# Patient Record
Sex: Female | Born: 1945 | Race: Black or African American | Hispanic: No | Marital: Married | Smoking: Never smoker
Health system: Southern US, Community
[De-identification: ages and names within clinical notes are randomized; demographics above are authoritative.]

## PROBLEM LIST (undated history)

## (undated) DIAGNOSIS — M949 Disorder of cartilage, unspecified: Secondary | ICD-10-CM

## (undated) DIAGNOSIS — Z860100 Personal history of colon polyps, unspecified: Secondary | ICD-10-CM

## (undated) DIAGNOSIS — E785 Hyperlipidemia, unspecified: Secondary | ICD-10-CM

## (undated) DIAGNOSIS — E119 Type 2 diabetes mellitus without complications: Secondary | ICD-10-CM

## (undated) DIAGNOSIS — M899 Disorder of bone, unspecified: Secondary | ICD-10-CM

## (undated) DIAGNOSIS — M1990 Unspecified osteoarthritis, unspecified site: Secondary | ICD-10-CM

## (undated) DIAGNOSIS — K8020 Calculus of gallbladder without cholecystitis without obstruction: Secondary | ICD-10-CM

## (undated) DIAGNOSIS — K219 Gastro-esophageal reflux disease without esophagitis: Secondary | ICD-10-CM

## (undated) DIAGNOSIS — R42 Dizziness and giddiness: Secondary | ICD-10-CM

## (undated) DIAGNOSIS — K222 Esophageal obstruction: Secondary | ICD-10-CM

## (undated) DIAGNOSIS — G4710 Hypersomnia, unspecified: Secondary | ICD-10-CM

## (undated) DIAGNOSIS — F3289 Other specified depressive episodes: Secondary | ICD-10-CM

## (undated) DIAGNOSIS — K5792 Diverticulitis of intestine, part unspecified, without perforation or abscess without bleeding: Secondary | ICD-10-CM

## (undated) DIAGNOSIS — E669 Obesity, unspecified: Secondary | ICD-10-CM

## (undated) DIAGNOSIS — K5730 Diverticulosis of large intestine without perforation or abscess without bleeding: Secondary | ICD-10-CM

## (undated) DIAGNOSIS — Z9289 Personal history of other medical treatment: Secondary | ICD-10-CM

## (undated) DIAGNOSIS — J309 Allergic rhinitis, unspecified: Secondary | ICD-10-CM

## (undated) DIAGNOSIS — F329 Major depressive disorder, single episode, unspecified: Secondary | ICD-10-CM

## (undated) DIAGNOSIS — Z780 Asymptomatic menopausal state: Secondary | ICD-10-CM

## (undated) DIAGNOSIS — K449 Diaphragmatic hernia without obstruction or gangrene: Secondary | ICD-10-CM

## (undated) DIAGNOSIS — I10 Essential (primary) hypertension: Secondary | ICD-10-CM

## (undated) DIAGNOSIS — F411 Generalized anxiety disorder: Secondary | ICD-10-CM

## (undated) DIAGNOSIS — J189 Pneumonia, unspecified organism: Secondary | ICD-10-CM

## (undated) HISTORY — DX: Gastro-esophageal reflux disease without esophagitis: K21.9

## (undated) HISTORY — DX: Dizziness and giddiness: R42

## (undated) HISTORY — PX: OOPHORECTOMY: SHX86

## (undated) HISTORY — PX: ROTATOR CUFF REPAIR: SHX139

## (undated) HISTORY — PX: ABDOMINAL HYSTERECTOMY: SHX81

## (undated) HISTORY — PX: TOTAL KNEE ARTHROPLASTY: SHX125

## (undated) HISTORY — DX: Personal history of colon polyps, unspecified: Z86.0100

## (undated) HISTORY — DX: Type 2 diabetes mellitus without complications: E11.9

## (undated) HISTORY — DX: Unspecified osteoarthritis, unspecified site: M19.90

## (undated) HISTORY — DX: Asymptomatic menopausal state: Z78.0

## (undated) HISTORY — PX: OTHER SURGICAL HISTORY: SHX169

## (undated) HISTORY — DX: Major depressive disorder, single episode, unspecified: F32.9

## (undated) HISTORY — DX: Hyperlipidemia, unspecified: E78.5

## (undated) HISTORY — DX: Other specified depressive episodes: F32.89

## (undated) HISTORY — DX: Essential (primary) hypertension: I10

## (undated) HISTORY — DX: Diverticulitis of intestine, part unspecified, without perforation or abscess without bleeding: K57.92

## (undated) HISTORY — PX: CHOLECYSTECTOMY: SHX55

## (undated) HISTORY — DX: Disorder of bone, unspecified: M89.9

## (undated) HISTORY — DX: Hypersomnia, unspecified: G47.10

## (undated) HISTORY — DX: Obesity, unspecified: E66.9

## (undated) HISTORY — DX: Personal history of other medical treatment: Z92.89

## (undated) HISTORY — DX: Pneumonia, unspecified organism: J18.9

## (undated) HISTORY — DX: Diverticulosis of large intestine without perforation or abscess without bleeding: K57.30

## (undated) HISTORY — DX: Calculus of gallbladder without cholecystitis without obstruction: K80.20

## (undated) HISTORY — DX: Diaphragmatic hernia without obstruction or gangrene: K44.9

## (undated) HISTORY — PX: POLYPECTOMY: SHX149

## (undated) HISTORY — DX: Esophageal obstruction: K22.2

## (undated) HISTORY — DX: Allergic rhinitis, unspecified: J30.9

## (undated) HISTORY — DX: Disorder of cartilage, unspecified: M94.9

## (undated) HISTORY — DX: Personal history of colonic polyps: Z86.010

## (undated) HISTORY — DX: Generalized anxiety disorder: F41.1

---

## 1998-05-21 ENCOUNTER — Ambulatory Visit (HOSPITAL_BASED_OUTPATIENT_CLINIC_OR_DEPARTMENT_OTHER): Admission: RE | Admit: 1998-05-21 | Discharge: 1998-05-21 | Payer: Self-pay | Admitting: Orthopedic Surgery

## 1998-10-29 ENCOUNTER — Ambulatory Visit (HOSPITAL_COMMUNITY): Admission: RE | Admit: 1998-10-29 | Discharge: 1998-10-29 | Payer: Self-pay | Admitting: Internal Medicine

## 1998-10-29 ENCOUNTER — Encounter: Payer: Self-pay | Admitting: Internal Medicine

## 1998-11-04 ENCOUNTER — Other Ambulatory Visit: Admission: RE | Admit: 1998-11-04 | Discharge: 1998-11-04 | Payer: Self-pay | Admitting: Internal Medicine

## 2000-09-27 ENCOUNTER — Inpatient Hospital Stay (HOSPITAL_COMMUNITY): Admission: EM | Admit: 2000-09-27 | Discharge: 2000-09-28 | Payer: Self-pay | Admitting: Emergency Medicine

## 2000-09-27 ENCOUNTER — Encounter: Payer: Self-pay | Admitting: Internal Medicine

## 2000-09-28 ENCOUNTER — Encounter: Payer: Self-pay | Admitting: Internal Medicine

## 2000-10-27 ENCOUNTER — Other Ambulatory Visit: Admission: RE | Admit: 2000-10-27 | Discharge: 2000-10-27 | Payer: Self-pay | Admitting: Internal Medicine

## 2000-10-27 ENCOUNTER — Encounter (INDEPENDENT_AMBULATORY_CARE_PROVIDER_SITE_OTHER): Payer: Self-pay | Admitting: Specialist

## 2001-09-02 ENCOUNTER — Encounter: Payer: Self-pay | Admitting: Emergency Medicine

## 2001-09-02 ENCOUNTER — Emergency Department (HOSPITAL_COMMUNITY): Admission: EM | Admit: 2001-09-02 | Discharge: 2001-09-02 | Payer: Self-pay | Admitting: Emergency Medicine

## 2003-03-19 ENCOUNTER — Encounter: Admission: RE | Admit: 2003-03-19 | Discharge: 2003-06-17 | Payer: Self-pay | Admitting: Internal Medicine

## 2003-07-17 ENCOUNTER — Encounter: Admission: RE | Admit: 2003-07-17 | Discharge: 2003-10-15 | Payer: Self-pay | Admitting: Internal Medicine

## 2004-07-02 ENCOUNTER — Ambulatory Visit: Payer: Self-pay | Admitting: Internal Medicine

## 2004-07-08 ENCOUNTER — Ambulatory Visit: Payer: Self-pay | Admitting: Internal Medicine

## 2004-07-29 ENCOUNTER — Encounter (INDEPENDENT_AMBULATORY_CARE_PROVIDER_SITE_OTHER): Payer: Self-pay | Admitting: Specialist

## 2004-07-29 ENCOUNTER — Observation Stay (HOSPITAL_COMMUNITY): Admission: RE | Admit: 2004-07-29 | Discharge: 2004-07-30 | Payer: Self-pay | Admitting: Surgery

## 2004-10-15 ENCOUNTER — Ambulatory Visit: Payer: Self-pay | Admitting: Internal Medicine

## 2004-12-31 ENCOUNTER — Ambulatory Visit: Payer: Self-pay | Admitting: Internal Medicine

## 2005-01-30 ENCOUNTER — Ambulatory Visit: Payer: Self-pay | Admitting: Internal Medicine

## 2005-04-22 ENCOUNTER — Ambulatory Visit: Payer: Self-pay | Admitting: Internal Medicine

## 2005-06-02 ENCOUNTER — Ambulatory Visit: Payer: Self-pay | Admitting: General Practice

## 2005-06-24 ENCOUNTER — Ambulatory Visit: Payer: Self-pay | Admitting: General Practice

## 2005-07-01 ENCOUNTER — Ambulatory Visit: Payer: Self-pay | Admitting: Internal Medicine

## 2005-08-17 ENCOUNTER — Ambulatory Visit: Payer: Self-pay | Admitting: Internal Medicine

## 2005-08-27 ENCOUNTER — Ambulatory Visit: Payer: Self-pay | Admitting: Internal Medicine

## 2005-10-21 ENCOUNTER — Ambulatory Visit: Payer: Self-pay | Admitting: Internal Medicine

## 2005-10-21 ENCOUNTER — Ambulatory Visit (HOSPITAL_COMMUNITY): Admission: RE | Admit: 2005-10-21 | Discharge: 2005-10-21 | Payer: Self-pay | Admitting: Internal Medicine

## 2005-10-26 ENCOUNTER — Ambulatory Visit: Payer: Self-pay | Admitting: Internal Medicine

## 2006-03-09 ENCOUNTER — Ambulatory Visit: Payer: Self-pay | Admitting: Internal Medicine

## 2006-05-13 ENCOUNTER — Ambulatory Visit: Payer: Self-pay | Admitting: Internal Medicine

## 2006-06-16 ENCOUNTER — Ambulatory Visit: Payer: Self-pay | Admitting: General Practice

## 2006-09-10 ENCOUNTER — Ambulatory Visit: Payer: Self-pay | Admitting: Internal Medicine

## 2006-09-10 LAB — CONVERTED CEMR LAB
ALT: 17 units/L (ref 0–40)
Albumin: 3.7 g/dL (ref 3.5–5.2)
Alkaline Phosphatase: 58 units/L (ref 39–117)
BUN: 17 mg/dL (ref 6–23)
Basophils Relative: 0.5 % (ref 0.0–1.0)
Bilirubin Urine: NEGATIVE
Calcium: 10 mg/dL (ref 8.4–10.5)
Chloride: 101 meq/L (ref 96–112)
Chol/HDL Ratio, serum: 4.6
GFR calc non Af Amer: 60 mL/min
Glomerular Filtration Rate, Af Am: 73 mL/min/{1.73_m2}
Hemoglobin: 12.9 g/dL (ref 12.0–15.0)
Hgb A1c MFr Bld: 7.3 % — ABNORMAL HIGH (ref 4.6–6.0)
LDL DIRECT: 129.4 mg/dL
Leukocytes, UA: NEGATIVE
Lymphocytes Relative: 56.5 % — ABNORMAL HIGH (ref 12.0–46.0)
MCHC: 33.6 g/dL (ref 30.0–36.0)
MCV: 87.3 fL (ref 78.0–100.0)
Monocytes Relative: 7.9 % (ref 3.0–11.0)
Potassium: 4.1 meq/L (ref 3.5–5.1)
RBC: 4.38 M/uL (ref 3.87–5.11)
Sodium: 140 meq/L (ref 135–145)
TSH: 1.82 microintl units/mL (ref 0.35–5.50)
Total Bilirubin: 0.7 mg/dL (ref 0.3–1.2)
Urine Glucose: NEGATIVE mg/dL
Urobilinogen, UA: 0.2 (ref 0.0–1.0)
VLDL: 75 mg/dL — ABNORMAL HIGH (ref 0–40)

## 2006-11-16 ENCOUNTER — Ambulatory Visit: Payer: Self-pay | Admitting: Internal Medicine

## 2006-12-14 ENCOUNTER — Inpatient Hospital Stay (HOSPITAL_COMMUNITY): Admission: RE | Admit: 2006-12-14 | Discharge: 2006-12-18 | Payer: Self-pay | Admitting: Orthopedic Surgery

## 2007-06-20 ENCOUNTER — Ambulatory Visit: Payer: Self-pay | Admitting: Internal Medicine

## 2007-06-20 LAB — CONVERTED CEMR LAB
Albumin: 3.9 g/dL (ref 3.5–5.2)
BUN: 19 mg/dL (ref 6–23)
Bilirubin, Direct: 0.1 mg/dL (ref 0.0–0.3)
CO2: 34 meq/L — ABNORMAL HIGH (ref 19–32)
Calcium: 9.9 mg/dL (ref 8.4–10.5)
Cholesterol: 205 mg/dL (ref 0–200)
Eosinophils Absolute: 0.1 10*3/uL (ref 0.0–0.6)
GFR calc non Af Amer: 68 mL/min
HCT: 37.4 % (ref 36.0–46.0)
Hemoglobin: 12.8 g/dL (ref 12.0–15.0)
Hgb A1c MFr Bld: 6.2 % — ABNORMAL HIGH (ref 4.6–6.0)
Lymphocytes Relative: 50.1 % — ABNORMAL HIGH (ref 12.0–46.0)
Microalb Creat Ratio: 16 mg/g (ref 0.0–30.0)
Monocytes Relative: 9.9 % (ref 3.0–11.0)
Neutro Abs: 2 10*3/uL (ref 1.4–7.7)
Nitrite: NEGATIVE
Platelets: 342 10*3/uL (ref 150–400)
Potassium: 4.2 meq/L (ref 3.5–5.1)
RBC: 4.1 M/uL (ref 3.87–5.11)
RDW: 12.4 % (ref 11.5–14.6)
Sodium: 145 meq/L (ref 135–145)
TSH: 2.05 microintl units/mL (ref 0.35–5.50)
Total Protein: 7.2 g/dL (ref 6.0–8.3)
Urobilinogen, UA: 0.2 (ref 0.0–1.0)
WBC: 5.3 10*3/uL (ref 4.5–10.5)
pH: 6 (ref 5.0–8.0)

## 2007-06-21 ENCOUNTER — Encounter: Payer: Self-pay | Admitting: Internal Medicine

## 2007-07-03 ENCOUNTER — Encounter: Payer: Self-pay | Admitting: Internal Medicine

## 2007-07-03 DIAGNOSIS — E785 Hyperlipidemia, unspecified: Secondary | ICD-10-CM

## 2007-07-03 DIAGNOSIS — K5730 Diverticulosis of large intestine without perforation or abscess without bleeding: Secondary | ICD-10-CM | POA: Insufficient documentation

## 2007-07-03 DIAGNOSIS — E119 Type 2 diabetes mellitus without complications: Secondary | ICD-10-CM

## 2007-07-03 DIAGNOSIS — F411 Generalized anxiety disorder: Secondary | ICD-10-CM

## 2007-07-03 DIAGNOSIS — F329 Major depressive disorder, single episode, unspecified: Secondary | ICD-10-CM

## 2007-07-03 DIAGNOSIS — K219 Gastro-esophageal reflux disease without esophagitis: Secondary | ICD-10-CM | POA: Insufficient documentation

## 2007-07-03 DIAGNOSIS — M1990 Unspecified osteoarthritis, unspecified site: Secondary | ICD-10-CM | POA: Insufficient documentation

## 2007-07-03 DIAGNOSIS — K449 Diaphragmatic hernia without obstruction or gangrene: Secondary | ICD-10-CM | POA: Insufficient documentation

## 2007-07-03 DIAGNOSIS — E1169 Type 2 diabetes mellitus with other specified complication: Secondary | ICD-10-CM | POA: Insufficient documentation

## 2007-07-03 DIAGNOSIS — I10 Essential (primary) hypertension: Secondary | ICD-10-CM | POA: Insufficient documentation

## 2007-07-03 DIAGNOSIS — E669 Obesity, unspecified: Secondary | ICD-10-CM | POA: Insufficient documentation

## 2007-07-03 DIAGNOSIS — Z86010 Personal history of colonic polyps: Secondary | ICD-10-CM | POA: Insufficient documentation

## 2007-07-03 DIAGNOSIS — J309 Allergic rhinitis, unspecified: Secondary | ICD-10-CM

## 2007-10-24 ENCOUNTER — Encounter: Payer: Self-pay | Admitting: Internal Medicine

## 2007-12-16 ENCOUNTER — Emergency Department (HOSPITAL_COMMUNITY): Admission: EM | Admit: 2007-12-16 | Discharge: 2007-12-16 | Payer: Self-pay | Admitting: Emergency Medicine

## 2008-03-06 ENCOUNTER — Telehealth (INDEPENDENT_AMBULATORY_CARE_PROVIDER_SITE_OTHER): Payer: Self-pay | Admitting: *Deleted

## 2008-03-08 ENCOUNTER — Ambulatory Visit: Payer: Self-pay | Admitting: Internal Medicine

## 2008-03-08 DIAGNOSIS — J189 Pneumonia, unspecified organism: Secondary | ICD-10-CM

## 2008-03-08 DIAGNOSIS — R05 Cough: Secondary | ICD-10-CM

## 2008-03-08 DIAGNOSIS — R209 Unspecified disturbances of skin sensation: Secondary | ICD-10-CM

## 2008-04-02 ENCOUNTER — Ambulatory Visit: Payer: Self-pay | Admitting: Internal Medicine

## 2008-04-02 LAB — CONVERTED CEMR LAB
AST: 20 units/L (ref 0–37)
Albumin: 3.6 g/dL (ref 3.5–5.2)
Alkaline Phosphatase: 50 units/L (ref 39–117)
Bilirubin, Direct: 0.1 mg/dL (ref 0.0–0.3)
Calcium: 9.2 mg/dL (ref 8.4–10.5)
Cholesterol: 209 mg/dL (ref 0–200)
Folate: 10.1 ng/mL
GFR calc Af Amer: 82 mL/min
GFR calc non Af Amer: 68 mL/min
HDL: 53.9 mg/dL (ref >39.0)
Hgb A1c MFr Bld: 6.7 % — ABNORMAL HIGH (ref 4.6–6.0)
Potassium: 4.2 meq/L (ref 3.5–5.1)
TSH: 2.33 microintl units/mL (ref 0.35–5.50)
Total Bilirubin: 0.8 mg/dL (ref 0.3–1.2)
Triglycerides: 187 mg/dL — ABNORMAL HIGH (ref 0–149)
VLDL: 37 mg/dL (ref 0–40)
Vitamin B-12: 464 pg/mL (ref 211–911)

## 2008-04-09 ENCOUNTER — Ambulatory Visit: Payer: Self-pay | Admitting: Internal Medicine

## 2008-04-09 DIAGNOSIS — M899 Disorder of bone, unspecified: Secondary | ICD-10-CM | POA: Insufficient documentation

## 2008-04-09 DIAGNOSIS — M949 Disorder of cartilage, unspecified: Secondary | ICD-10-CM

## 2008-04-16 ENCOUNTER — Encounter: Payer: Self-pay | Admitting: Internal Medicine

## 2008-04-16 ENCOUNTER — Ambulatory Visit: Payer: Self-pay | Admitting: Internal Medicine

## 2008-06-19 ENCOUNTER — Telehealth (INDEPENDENT_AMBULATORY_CARE_PROVIDER_SITE_OTHER): Payer: Self-pay | Admitting: *Deleted

## 2008-06-22 ENCOUNTER — Ambulatory Visit: Payer: Self-pay | Admitting: Internal Medicine

## 2008-10-15 ENCOUNTER — Ambulatory Visit: Payer: Self-pay | Admitting: Internal Medicine

## 2008-10-15 DIAGNOSIS — J019 Acute sinusitis, unspecified: Secondary | ICD-10-CM

## 2008-10-17 LAB — CONVERTED CEMR LAB
CO2: 26 meq/L (ref 19–32)
Calcium: 10.1 mg/dL (ref 8.4–10.5)
Creatinine, Ser: 1 mg/dL (ref 0.4–1.2)
Glucose, Bld: 162 mg/dL — ABNORMAL HIGH (ref 70–99)
HDL: 57.3 mg/dL (ref >39.0)
Hgb A1c MFr Bld: 6.8 % — ABNORMAL HIGH (ref 4.6–6.0)
VLDL: 54 mg/dL — ABNORMAL HIGH (ref 0–40)

## 2009-01-03 ENCOUNTER — Telehealth (INDEPENDENT_AMBULATORY_CARE_PROVIDER_SITE_OTHER): Payer: Self-pay | Admitting: *Deleted

## 2009-01-11 ENCOUNTER — Telehealth: Payer: Self-pay | Admitting: Internal Medicine

## 2009-01-11 ENCOUNTER — Ambulatory Visit: Payer: Self-pay | Admitting: Internal Medicine

## 2009-01-11 DIAGNOSIS — E860 Dehydration: Secondary | ICD-10-CM

## 2009-03-14 ENCOUNTER — Encounter (INDEPENDENT_AMBULATORY_CARE_PROVIDER_SITE_OTHER): Payer: Self-pay | Admitting: *Deleted

## 2009-08-07 ENCOUNTER — Ambulatory Visit: Payer: Self-pay | Admitting: Internal Medicine

## 2009-08-07 DIAGNOSIS — R42 Dizziness and giddiness: Secondary | ICD-10-CM

## 2009-08-09 LAB — CONVERTED CEMR LAB
Calcium: 9.1 mg/dL (ref 8.4–10.5)
Chloride: 103 meq/L (ref 96–112)
Direct LDL: 113.2 mg/dL
Glucose, Bld: 238 mg/dL — ABNORMAL HIGH (ref 70–99)
Hgb A1c MFr Bld: 9 % — ABNORMAL HIGH (ref 4.6–6.5)
Potassium: 4.3 meq/L (ref 3.5–5.1)

## 2009-10-25 ENCOUNTER — Telehealth: Payer: Self-pay | Admitting: Internal Medicine

## 2010-03-14 ENCOUNTER — Telehealth: Payer: Self-pay | Admitting: Internal Medicine

## 2010-03-24 ENCOUNTER — Ambulatory Visit (HOSPITAL_COMMUNITY): Admission: RE | Admit: 2010-03-24 | Discharge: 2010-03-24 | Payer: Self-pay | Admitting: Internal Medicine

## 2010-03-26 ENCOUNTER — Encounter: Payer: Self-pay | Admitting: Internal Medicine

## 2010-03-27 ENCOUNTER — Encounter: Admission: RE | Admit: 2010-03-27 | Discharge: 2010-03-27 | Payer: Self-pay | Admitting: Internal Medicine

## 2010-03-27 LAB — HM MAMMOGRAPHY

## 2010-04-16 ENCOUNTER — Encounter (INDEPENDENT_AMBULATORY_CARE_PROVIDER_SITE_OTHER): Payer: Self-pay | Admitting: *Deleted

## 2010-04-18 ENCOUNTER — Encounter: Payer: Self-pay | Admitting: Internal Medicine

## 2010-05-02 ENCOUNTER — Encounter (INDEPENDENT_AMBULATORY_CARE_PROVIDER_SITE_OTHER): Payer: Self-pay

## 2010-05-06 ENCOUNTER — Encounter (INDEPENDENT_AMBULATORY_CARE_PROVIDER_SITE_OTHER): Payer: Self-pay

## 2010-05-06 ENCOUNTER — Ambulatory Visit: Payer: Self-pay | Admitting: Internal Medicine

## 2010-05-07 ENCOUNTER — Telehealth: Payer: Self-pay | Admitting: Internal Medicine

## 2010-05-19 ENCOUNTER — Ambulatory Visit: Payer: Self-pay | Admitting: Internal Medicine

## 2010-05-19 LAB — HM COLONOSCOPY

## 2010-05-21 ENCOUNTER — Encounter: Payer: Self-pay | Admitting: Internal Medicine

## 2010-09-29 ENCOUNTER — Telehealth: Payer: Self-pay | Admitting: Internal Medicine

## 2010-10-02 NOTE — Letter (Signed)
Summary: Previsit letter  [facility]  [place], [place] [address]   Phone: [phone]  Fax: [phone]       04/16/2010 MRN: [id number]  [place], [place]  [address]  Dear Ms. Sheppard Coil,  Welcome to the Gastroenterology Division at [facility].    You are scheduled to see a nurse for your pre-procedure visit on 05-21-2010 at 10:00am on the 3rd floor at [facility], [place] [employer].  We ask that you try to arrive at our office 15 minutes prior to your appointment time to allow for check-in.  Your nurse visit will consist of discussing your medical and surgical history, your immediate family medical history, and your medications.    Please bring a complete list of all your medications or, if you prefer, bring the medication bottles and we will list them.  We will need to be aware of both prescribed and over the counter drugs.  We will need to know exact dosage information as well.  If you are on blood thinners (Coumadin, Plavix, Aggrenox, Ticlid, etc.) please call our office today/prior to your appointment, as we need to consult with your physician about holding your medication.   Please be prepared to read and sign documents such as consent forms, a financial agreement, and acknowledgement forms.  If necessary, and with your consent, a friend or relative is welcome to sit-in on the nurse visit with you.  Please bring your insurance card so that we may make a copy of it.  If your insurance requires a referral to see a specialist, please bring your referral form from your primary care physician.  No co-pay is required for this nurse visit.     If you cannot keep your appointment, please call [phone] to cancel or reschedule prior to your appointment date.  This allows [place] the opportunity to schedule an appointment for another patient in need of care.    Thank you for choosing [facility] for your medical  needs.  We appreciate the opportunity to care for you.  Please visit [place] at our website  to learn more about our practice.                     Sincerely.                                                                                                                   The Gastroenterology Division

## 2010-10-02 NOTE — Progress Notes (Signed)
Summary: REFILL - Tramadol  Phone Note Refill Request   Refills Requested: Medication #1:  TRAMADOL HCL 50 MG TABS 1 by mouth q 6 hrs as needed pain [facility]  Initial call taken by: Charlsie Quest, CMA,  May 07, 2010 5:36 PM  Follow-up for Phone Call        [facility] and most likely did not get this rx, will need to call pharm in am to comfirm reciept of last refill.  Follow-up by: Charlsie Quest, CMA,  May 07, 2010 6:38 PM     no, just refilled sept 6, 2011 Biagio Borg MD  May 07, 2010 5:41 PM   Per pharmacist Joelene Millin @ [facility] [address] e-script 05/06/2010 was not recieved.  Verbal given for #40 x 1. Crissie Sickles, CMA  May 08, 2010 9:36 AM

## 2010-10-02 NOTE — Letter (Signed)
Summary: Patient Notice- Polyp Results  [facility]  [address] [place], [place] [address]   Phone: [phone]  Fax: [phone]        May 21, 2010 MRN: [id number]    [place], [place]  [address]    Dear Ms. Goldblatt,  I am pleased to inform you that the colon polyp(s) removed during your recent colonoscopy was (were) found to be benign (no cancer detected) upon pathologic examination.  I recommend you have a repeat colonoscopy examination in 5 years to look for recurrent polyps, as having colon polyps increases your risk for having recurrent polyps or even colon cancer in the future.  Should you develop new or worsening symptoms of abdominal pain, bowel habit changes or bleeding from the rectum or bowels, please schedule an evaluation with either your primary care physician or with me.  Additional information/recommendations:  __ No further action with gastroenterology is needed at this time. Please      follow-up with your primary care physician for your other healthcare      needs.    Please call us if you are having persistent problems or have questions about your condition that have not been fully answered at this time.  Sincerely,  Irene Shipper MD  This letter has been electronically signed by your physician.  Appended Document: Patient Notice- Polyp Results letter mailed

## 2010-10-02 NOTE — Progress Notes (Signed)
Summary: Schedule Colonoscopy   Phone Note Outgoing Call Call back at Home Phone [phone]   Call placed by: Bernita Buffy CMA Deborra Medina),  October 25, 2009 9:55 AM Call placed to: Patient Summary of Call: patient advised she needs a colonoscopy she does not have insurance at this time; I gave her the number for patient assistance. She will contact them and then give [place] a call back when she has heard back from them about her coverage.  Initial call taken by: Bernita Buffy CMA ([place]),  October 25, 2009 10:00 AM

## 2010-10-02 NOTE — Miscellaneous (Signed)
Summary: Lec previsit  Clinical Lists Changes  Medications: Added new medication of MOVIPREP 100 GM  SOLR (PEG-KCL-NACL-NASULF-NA ASC-C) As per prep instructions. - Signed Rx of MOVIPREP 100 GM  SOLR (PEG-KCL-NACL-NASULF-NA ASC-C) As per prep instructions.;  #1 x 0;  Signed;  Entered by: Cornelia Copa RN;  Authorized by: Irene Shipper MD;  Method used: Electronically to [facility] [phone]*, [address] [place], [place]  [address], Ph: [phone], Fax: [phone] Observations: Added new observation of ALLERGY REV: Done (05/06/2010 15:38)    Prescriptions: MOVIPREP 100 GM  SOLR (PEG-KCL-NACL-NASULF-NA ASC-C) As per prep instructions.  #1 x 0   Entered by:   Cornelia Copa RN   Authorized by:   Irene Shipper MD   Signed by:   Cornelia Copa RN on 05/06/2010   Method used:   Electronically to        [facility] [phone]* (retail)       [address]       [place], [place]  [address]       Ph: [phone]       Fax: [phone]   RxIDYN:[id number]

## 2010-10-02 NOTE — Letter (Signed)
Summary: Previsit letter  [facility]  [place], [place] [address]   Phone: [phone]  Fax: [phone]       04/18/2010 MRN: [id number]  [place], [place]  [address]  Dear Cindy Larson,  Welcome to the Gastroenterology Division at [facility].    You are scheduled to see a nurse for your pre-procedure visit on  05-06-10 at 3:30pm on the 3rd floor at [facility], [place] [employer].  We ask that you try to arrive at our office 15 minutes prior to your appointment time to allow for check-in.  Your nurse visit will consist of discussing your medical and surgical history, your immediate family medical history, and your medications.    Please bring a complete list of all your medications or, if you prefer, bring the medication bottles and we will list them.  We will need to be aware of both prescribed and over the counter drugs.  We will need to know exact dosage information as well.  If you are on blood thinners (Coumadin, Plavix, Aggrenox, Ticlid, etc.) please call our office today/prior to your appointment, as we need to consult with your physician about holding your medication.   Please be prepared to read and sign documents such as consent forms, a financial agreement, and acknowledgement forms.  If necessary, and with your consent, a friend or relative is welcome to sit-in on the nurse visit with you.  Please bring your insurance card so that we may make a copy of it.  If your insurance requires a referral to see a specialist, please bring your referral form from your primary care physician.  No co-pay is required for this nurse visit.     If you cannot keep your appointment, please call [phone] to cancel or reschedule prior to your appointment date.  This allows [place] the opportunity to schedule an appointment for another patient in need of care.    Thank you for choosing [facility] for your medical  needs.  We appreciate the opportunity to care for you.  Please visit [place] at our website  to learn more about our practice.                     Sincerely.                                                                                                                   The Gastroenterology Division

## 2010-10-02 NOTE — Procedures (Signed)
Summary: Colonoscopy  Patient: Marshell Cobler Note: All result statuses are Final unless otherwise noted.  Tests: (1) Colonoscopy (COL)   COL Colonoscopy           [facility]     [place], [place]  [address]           COLONOSCOPY PROCEDURE REPORT           PATIENT:  Cindy Larson, Cindy Larson  MR#:  [id number]     BIRTHDATE:  [birth year], 63 yrs. old  GENDER:  female     ENDOSCOPIST:  Docia Chuck. Geri Seminole, MD     REF. BY:  Surveillance Program Recall,     PROCEDURE DATE:  05/19/2010     PROCEDURE:  Colonoscopy with snare polypectomy x 4     ASA CLASS:  Class II     INDICATIONS:  history of pre-cancerous (adenomatous) colon polyps,     surveillance and high-risk screening ; index 200 (TA); f/u 2002,     2005     MEDICATIONS:   Fentanyl 100 mcg IV, Versed 10 mg IV           DESCRIPTION OF PROCEDURE:   After the risks benefits and     alternatives of the procedure were thoroughly explained, informed     consent was obtained.  Digital rectal exam was performed and     revealed no abnormalities.   The LB CF-[id number] endoscope     was introduced through the anus and advanced to the cecum, which     was identified by both the appendix and ileocecal valve, without     limitations.Time to cecum = 6:19 min. The quality of the prep was     excellent, using MoviPrep.  The instrument was then slowly     withdrawn (time = 11:30 min) as the colon was fully examined.     <<PROCEDUREIMAGES>>           FINDINGS:  Four polyps (all < 52mm) were found in the cecum (2),     ascending and desscending colon. Polyps were snared without     cautery. Retrieval was successful.   Mild diverticulosis was found     in the sigmoid colon.   Retroflexed views in the rectum revealed     no abnormalities.    The scope was then withdrawn from the patient     and the procedure completed.           COMPLICATIONS:  None           ENDOSCOPIC IMPRESSION:     1) Four polyps -  removed     2) Mild diverticulosis in the sigmoid colon           RECOMMENDATIONS:     1) Follow up colonoscopy in 5 years           ______________________________     Docia Chuck. Geri Seminole, MD           CC:  Biagio Borg, MD; The Patient           n.     eSIGNED:   Docia Chuck. Geri Seminole at 05/19/2010 04:03 PM           Marissa Calamity, [id number]  Note: An exclamation mark (!) indicates a result that was not dispersed into the flowsheet. Document Creation Date: 05/19/2010 4:04 PM _______________________________________________________________________  (1) Order result status:  Final Collection or observation date-time: 05/19/2010 15:54 Requested date-time:  Receipt date-time:  Reported date-time:  Referring Physician:   Ordering Physician: Lavena Bullion [phone]) Specimen Source:  Source: Tawanna Cooler Order Number: [id number] Lab site:   Appended Document: Colonoscopy     Procedures Next Due Date:    Colonoscopy: 05/2015

## 2010-10-02 NOTE — Letter (Signed)
Summary: [facility] Instructions  [facility]  [place], [place] [address]   Phone: [phone]  Fax: [phone]       Cindy Larson    13-Aug-1946    MRN: [id number]        Procedure Day /Date:  Monday 05/19/2010     Arrival Time: 3:00 pm      Procedure Time: 4:00 pm     Location of Procedure:                    _ x_  [facility] (4th Floor)                        [place]   Starting 5 days prior to your procedure Wednesday 9/14 do not eat nuts, seeds, popcorn, corn, beans, peas,  salads, or any raw vegetables.  Do not take any fiber supplements (e.g. Metamucil, Citrucel, and Benefiber).  THE DAY BEFORE YOUR PROCEDURE         DATE: Sunday 9/18  1.  Drink clear liquids the entire day-NO SOLID FOOD  2.  Do not drink anything colored red or purple.  Avoid juices with pulp.  No orange juice.  3.  Drink at least 64 oz. (8 glasses) of fluid/clear liquids during the day to prevent dehydration and help the prep work efficiently.  CLEAR LIQUIDS INCLUDE: Water Jello Ice Popsicles Tea (sugar ok, no milk/cream) Powdered fruit flavored drinks Coffee (sugar ok, no milk/cream) Gatorade Juice: apple, white grape, white cranberry  Lemonade Clear bullion, consomm, broth Carbonated beverages (any kind) Strained chicken noodle soup Hard Candy                             4.  In the morning, mix first dose of MoviPrep solution:    Empty 1 Pouch A and 1 Pouch B into the disposable container    Add lukewarm drinking water to the top line of the container. Mix to dissolve    Refrigerate (mixed solution should be used within 24 hrs)  5.  Begin drinking the prep at 5:00 p.m. The MoviPrep container is divided by 4 marks.   Every 15 minutes drink the solution down to the next mark (approximately 8 oz) until the full liter is complete.   6.  Follow completed prep with 16 oz of clear liquid of your choice  (Nothing red or purple).  Continue to drink clear liquids until bedtime.  7.  Before going to bed, mix second dose of MoviPrep solution:    Empty 1 Pouch A and 1 Pouch B into the disposable container    Add lukewarm drinking water to the top line of the container. Mix to dissolve    Refrigerate  THE DAY OF YOUR PROCEDURE      DATE: Monday 9/19  Beginning at 11:00 a.m. (5 hours before procedure):         1. Every 15 minutes, drink the solution down to the next mark (approx 8 oz) until the full liter is complete.  2. Follow completed prep with 16 oz. of clear liquid of your choice.    3. You may drink clear liquids until 2:00 pm (2 HOURS BEFORE PROCEDURE).   MEDICATION INSTRUCTIONS  Unless otherwise instructed, you should take regular prescription medications with a small sip of water   as early as possible the morning of  your procedure.  Diabetic patients - see separate instructions.  Additional medication instructions: Do not take Furosemide am of procedure.         OTHER INSTRUCTIONS  You will need a responsible adult at least 65 years of age to accompany you and drive you home.   This person must remain in the waiting room during your procedure.  Wear loose fitting clothing that is easily removed.  Leave jewelry and other valuables at home.  However, you may wish to bring a book to read or  an iPod/MP3 player to listen to music as you wait for your procedure to start.  Remove all body piercing jewelry and leave at home.  Total time from sign-in until discharge is approximately 2-3 hours.  You should go home directly after your procedure and rest.  You can resume normal activities the  day after your procedure.  The day of your procedure you should not:   Drive   Make legal decisions   Operate machinery   Drink alcohol   Return to work  You will receive specific instructions about eating, activities and medications before you leave.    The above  instructions have been reviewed and explained to me by   Cornelia Copa RN  May 06, 2010 4:07 PM     I fully understand and can verbalize these instructions _____________________________ Date _________

## 2010-10-02 NOTE — Letter (Signed)
Summary: Diabetic Instructions  [facility]  [place], [place] [address]   Phone: [phone]  Fax: [phone]    ELEESA VANAKEN 1946/01/19 MRN: [id number]   _  _   ORAL DIABETIC MEDICATION INSTRUCTIONS  The day before your procedure:   Take your diabetic pill as you do normally  The day of your procedure:   Do not take your diabetic pill    We will check your blood sugar levels during the admission process and again in Recovery before discharging you home  ________________________________________________________________________  _  _   INSULIN (LONG ACTING) MEDICATION INSTRUCTIONS (Lantus, NPH, 70/30, Humulin, Novolin-N)   The day before your procedure:   Take  your regular evening dose    The day of your procedure:   Do not take your morning dose    _  _   INSULIN (SHORT ACTING) MEDICATION INSTRUCTIONS (Regular, Humulog, Novolog)   The day before your procedure:   Do not take your evening dose   The day of your procedure:   Do not take your morning dose   _  _   INSULIN PUMP MEDICATION INSTRUCTIONS  We will contact the physician managing your diabetic care for written dosage instructions for the day before your procedure and the day of your procedure.  Once we have received the instructions, we will contact you.

## 2010-10-02 NOTE — Progress Notes (Signed)
Summary: Allergy? to simvastatin  Phone Note Call from Patient Call back at [facility] Phone [phone]   Summary of Call: Pt currently takes simvastatin 80mg  1/2 tab. She c/o itching when she takes medication. When pt stops med symptoms stop. She is req alternative. Please advise.  Initial call taken by: Charlsie Quest, [place],  March 14, 2010 11:25 AM  Follow-up for Phone Call        to stop simvastatin  start lovastatin 40 mg per day instead - done per emr Follow-up by: Biagio Borg MD,  March 14, 2010 1:14 PM  Additional Follow-up for Phone Call Additional follow up Details #1::        Patient notified per MD but wants rx sent to [facility]..Ellison Hughs [place] CMA  March 14, 2010 2:12 PM    New Allergies: ! ZOCOR New/Updated Medications: LOVASTATIN 40 MG TABS (LOVASTATIN) 1 by mouth once daily New Allergies: ! ZOCORPrescriptions: LOVASTATIN 40 MG TABS (LOVASTATIN) 1 by mouth once daily  #90 x 3   Entered by:   Estell Harpin CMA   Authorized by:   Biagio Borg MD   Signed by:   Estell Harpin CMA on 03/14/2010   Method used:   Electronically to        [facility] [phone]* (retail)       [place], [place]  [address]       Ph: [phone]       Fax: [phone]   RxIDAN:[id number] LOVASTATIN 40 MG TABS (LOVASTATIN) 1 by mouth once daily  #90 x 3   Entered and Authorized by:   Biagio Borg MD   Signed by:   Biagio Borg MD on 03/14/2010   Method used:   [facility] [phone]* (retail)       [place] [address]       [place], [place]  [address]       Ph: [phone] or [phone]       Fax: [phone]   RxID:   [id number]

## 2010-10-08 NOTE — Progress Notes (Signed)
  Phone Note Refill Request Message from:  Fax from Pharmacy on September 29, 2010 1:15 PM  Refills Requested: Medication #1:  GLIMEPIRIDE 4 MG TABS 1po once daily   Dosage confirmed as above?Dosage Confirmed   Last Refilled: 08/26/2010   Notes: [facility] Initial call taken by: Shirlean Mylar Ewing CMA (A[place]),  September 29, 2010 1:16 PM    Prescriptions: GLIMEPIRIDE 4 MG TABS (GLIMEPIRIDE) 1po once daily  #30 x 0   Entered by:   Sharon Seller CMA ([place])   Authorized by:   Biagio Borg MD   Signed by:   Sharon Seller CMA ([place]) on 09/29/2010   Method used:   Faxed to ...       [facility] [phone]* (retail)       [address]       [place], [place]  [address]       Ph: [phone]       Fax: [phone]   RxID:   [id number]

## 2010-10-24 ENCOUNTER — Encounter (INDEPENDENT_AMBULATORY_CARE_PROVIDER_SITE_OTHER): Payer: Self-pay | Admitting: Internal Medicine

## 2010-10-24 ENCOUNTER — Encounter: Payer: Self-pay | Admitting: Internal Medicine

## 2010-10-24 ENCOUNTER — Other Ambulatory Visit: Payer: Self-pay

## 2010-10-24 ENCOUNTER — Other Ambulatory Visit: Payer: Self-pay | Admitting: Internal Medicine

## 2010-10-24 DIAGNOSIS — R109 Unspecified abdominal pain: Secondary | ICD-10-CM | POA: Insufficient documentation

## 2010-10-24 DIAGNOSIS — E119 Type 2 diabetes mellitus without complications: Secondary | ICD-10-CM

## 2010-10-24 DIAGNOSIS — E785 Hyperlipidemia, unspecified: Secondary | ICD-10-CM

## 2010-10-24 DIAGNOSIS — Z0000 Encounter for general adult medical examination without abnormal findings: Secondary | ICD-10-CM

## 2010-10-24 DIAGNOSIS — Z23 Encounter for immunization: Secondary | ICD-10-CM

## 2010-10-24 LAB — CONVERTED CEMR LAB
Basophils Absolute: 0 10*3/uL (ref 0.0–0.1)
HCT: 42.3 % (ref 36.0–46.0)
Hemoglobin: 14.3 g/dL (ref 12.0–15.0)
Lymphocytes Relative: 58 % — ABNORMAL HIGH (ref 12–46)
Lymphs Abs: 3.2 10*3/uL (ref 0.7–4.0)
MCHC: 33.8 g/dL (ref 30.0–36.0)
MCV: 89.1 fL (ref 78.0–100.0)
Monocytes Absolute: 0.4 10*3/uL (ref 0.1–1.0)
Monocytes Relative: 8 % (ref 3–12)
Neutrophils Relative %: 32 % — ABNORMAL LOW (ref 43–77)
RDW: 12.9 % (ref 11.5–15.5)

## 2010-10-24 LAB — URINALYSIS, ROUTINE W REFLEX MICROSCOPIC
Ketones, ur: 15
Nitrite: NEGATIVE

## 2010-10-24 LAB — LDL CHOLESTEROL, DIRECT: Direct LDL: 127.3 mg/dL

## 2010-10-24 LAB — HEMOGLOBIN A1C: Hgb A1c MFr Bld: 11.8 % — ABNORMAL HIGH (ref 4.6–6.5)

## 2010-10-24 LAB — BASIC METABOLIC PANEL
CO2: 29 mEq/L (ref 19–32)
Calcium: 9.6 mg/dL (ref 8.4–10.5)
Chloride: 98 mEq/L (ref 96–112)
GFR: 73.39 mL/min (ref >60.00)
Glucose, Bld: 248 mg/dL — ABNORMAL HIGH (ref 70–99)
Sodium: 137 mEq/L (ref 135–145)

## 2010-10-24 LAB — LIPID PANEL
Cholesterol: 206 mg/dL — ABNORMAL HIGH (ref 0–200)
HDL: 51.6 mg/dL (ref >39.00)
Triglycerides: 222 mg/dL — ABNORMAL HIGH (ref 0.0–149.0)
VLDL: 44.4 mg/dL — ABNORMAL HIGH (ref 0.0–40.0)

## 2010-10-24 LAB — LIPASE: Lipase: 30 U/L (ref 11.0–59.0)

## 2010-10-28 NOTE — Assessment & Plan Note (Signed)
Summary: CPX/NO INSURANCE/WANT LABS AFTER/NWS   Vital Signs:  Patient profile:   65 year old female Height:      60 inches Weight:      179 pounds BMI:     35.08 O2 Sat:      95 % on Room air Temp:     97.8 degrees F oral Pulse rate:   90 / minute BP sitting:   112 / 72  (left arm) Cuff size:   regular  Vitals Entered By: Shirlean Mylar Ewing CMA ([place]) (October 24, 2010 10:42 AM)  O2 Flow:  Room air  CC: Adult Physical/RE   CC:  Adult Physical/RE.  History of Present Illness: here for wellness, adn f/u;  Pt denies CP, worsening sob, doe, wheezing, orthopnea, pnd, worsening LE edema, palps, dizziness or syncope  Pt denies new neuro symptoms such as headache, facial or extremity weakness  Pt denies polydipsia, polyuria, or low sugar symptoms such as shakiness improved with eating.  Overall good compliance with meds, trying to follow low chol, DM diet, wt stable, little excercise however Overall good compliance with meds, and good tolerability, except has been financially challenged recently  and has taken the metformin and glimeparide have had spotty compllance.  No fever, wt loss, night sweats, loss of appetite or other constitutional symptoms  Denies worsening depressive symptoms, suicidal ideation, or panic, though has felt low severaltimes recently with problems of her children.   Pt states good ability with ADL's, low fall risk, home safety reviewed and adequate, no significant change in hearing or vision, trying to follow lower chol diet, and occasionally active only with regular excercise.   Preventive Screening-Counseling & Management      Drug Use:  no.    Problems Prior to Update: 1)  Abdominal Pain Other Specified Site  (ICD-789.09) 2)  Intermittent Vertigo  (ICD-780.4) 3)  Sinusitis- Acute-nos  (ICD-461.9) 4)  Sinusitis- Acute-nos  (ICD-461.9) 5)  Dehydration  (ICD-276.51) 6)  Sinusitis- Acute-nos  (ICD-461.9) 7)  Preventive Health Care  (ICD-V70.0) 8)  Osteopenia   (ICD-733.90) 9)  Paresthesia  (ICD-782.0) 10)  Cough  (ICD-786.2) 11)  Pneumonia, Organism Unspecified  (ICD-486) 12)  Diverticulosis, Colon  (ICD-562.10) 13)  Osteoarthritis  (ICD-715.90) 14)  Obesity  (ICD-278.00) 15)  Postmenopausal On Hormone Replacement Therapy  (ICD-V07.4) 16)  Hypertension  (ICD-401.9) 17)  Diabetes Mellitus, Type II  (ICD-250.00) 18)  Allergic Rhinitis  (ICD-477.9) 19)  Hiatal Hernia  (ICD-553.3) 20)  Hyperlipidemia  (ICD-272.4) 21)  Gerd  (ICD-530.81) 22)  Depression  (ICD-311) 23)  Colonic Polyps, Hx of  (ICD-V12.72) 24)  Anxiety  (ICD-300.00) 25)  Neoplasm, Malignant, Colon, Family Hx, Sibling  (ICD-V16.0)  Medications Prior to Update: 1)  Furosemide 40 Mg Tabs (Furosemide) .Marland Kitchen.. 1 Tablet By Mouth Once A Day 2)  Lisinopril 20 Mg Tabs (Lisinopril) .... Take 1 Tablet By Mouth Once A Day 3)  Meclizine Hcl 25 Mg Tabs (Meclizine Hcl) .Marland Kitchen.. 1 By Mouth Q 6 Hrs As Needed Dizziness 4)  Metformin Hcl 500 Mg Tb24 (Metformin Hcl) .... 2 By Mouth Two Times A Day 5)  Omeprazole 20 Mg Cpdr (Omeprazole) .... 2 By Mouth Once Daily 6)  Paroxetine Hcl 20 Mg Tabs (Paroxetine Hcl) .... Take 1 By Mouth Once Daily 7)  Estradiol 1 Mg Tabs (Estradiol) .Marland Kitchen.. 1 By Mouth Once Daily 8)  Promethazine Hcl 25 Mg Tabs (Promethazine Hcl) .Marland Kitchen.. 1 By Mouth Q 6hrs As Needed Nausea 9)  B-100 Complex   Tabs (Vitamins-Lipotropics) .Marland KitchenMarland KitchenMarland Kitchen  1 By Mouth Qd 10)  Glimepiride 4 Mg Tabs (Glimepiride) .Marland Kitchen.. 1po Once Daily 11)  Tramadol Hcl 50 Mg Tabs (Tramadol Hcl) .Marland Kitchen.. 1 By Mouth Q 6 Hrs As Needed Pain 12)  Lovastatin 40 Mg Tabs (Lovastatin) .Marland Kitchen.. 1 By Mouth Once Daily  Current Medications (verified): 1)  Furosemide 40 Mg Tabs (Furosemide) .Marland Kitchen.. 1 Tablet By Mouth Once A Day 2)  Lisinopril 20 Mg Tabs (Lisinopril) .... Take 1 Tablet By Mouth Once A Day 3)  Meclizine Hcl 25 Mg Tabs (Meclizine Hcl) .Marland Kitchen.. 1 By Mouth Q 6 Hrs As Needed Dizziness 4)  Metformin Hcl 500 Mg Tabs (Metformin Hcl) .... 2 By Mouth Two Times  A Day 5)  Omeprazole 20 Mg Cpdr (Omeprazole) .... 2 By Mouth Once Daily 6)  Paroxetine Hcl 20 Mg Tabs (Paroxetine Hcl) .... Take 1 By Mouth Once Daily 7)  Estradiol 1 Mg Tabs (Estradiol) .Marland Kitchen.. 1 By Mouth Once Daily 8)  Promethazine Hcl 25 Mg Tabs (Promethazine Hcl) .Marland Kitchen.. 1 By Mouth Q 6hrs As Needed Nausea 9)  Glimepiride 4 Mg Tabs (Glimepiride) .Marland Kitchen.. 1po Once Daily 10)  Tramadol Hcl 50 Mg Tabs (Tramadol Hcl) .Marland Kitchen.. 1 By Mouth Q 6 Hrs As Needed Pain 11)  Lovastatin 40 Mg Tabs (Lovastatin) .Marland Kitchen.. 1 By Mouth Once Daily 12)  Ciprofloxacin Hcl 500 Mg Tabs (Ciprofloxacin Hcl) .Marland Kitchen.. 1po Two Times A Day 13)  Metronidazole 250 Mg Tabs (Metronidazole) .Marland Kitchen.. 1 By Mouth Four Times Per Day For 10 Days  Allergies (verified): 1)  ! Pcn 2)  ! Asa 3)  ! Zocor  Past History:  Past Medical History: Last updated: 04/09/2008 Anxiety Colonic polyps, hx of Depression GERD Hyperlipidemia Hiatal Hernia Allergic rhinitis Diabetes mellitus, type II Hypertension Post [place]. hormone replacement therapy Obesity Osteoarthritis-L Knee Diverticulosis, colon Hypersomnolence esophageal stricture Osteopenia  Past Surgical History: Last updated: 07/03/2007 Hysterectomy Oophorectomy-one Rotator cuff repair, L Caesarean section Lysis of Adhesions  Family History: Last updated: 10/24/2010 Family History Hypertension - mother  Colon cancer - sister mother - CHF, afib, cancer in"thigh" DM - uncle and aunt brother with lung cancer/smoker sister with colon cancer  Social History: Last updated: 10/24/2010 Married Never Smoked 3 children work - Control and instrumentation engineer Alcohol use-no Drug use-no  Risk Factors: Smoking Status: never (04/09/2008)  Family History: Family History Hypertension - mother  Colon cancer - sister mother - CHF, afib, cancer in"thigh" DM - uncle and aunt brother with lung cancer/smoker sister with colon cancer  Social History: Married Never Smoked 3 children work - Oncologist Alcohol use-no Drug use-no Drug Use:  no  Review of Systems  The patient denies anorexia, fever, vision loss, decreased hearing, hoarseness, chest pain, syncope, dyspnea on exertion, peripheral edema, prolonged cough, headaches, hemoptysis, abdominal pain, melena, hematochezia, severe indigestion/heartburn, hematuria, muscle weakness, suspicious skin lesions, transient blindness, difficulty walking, depression, unusual weight change, abnormal bleeding, enlarged lymph nodes, and angioedema.         all otherwise negative per pt -    Physical Exam  General:  alert and overweight-appearing.   Head:  normocephalic and atraumatic.   Eyes:  vision grossly intact, pupils equal, and pupils round.   Ears:  R ear normal and L ear normal.   Nose:  no external deformity and no nasal discharge.   Mouth:  no gingival abnormalities and pharynx pink and moist.   Neck:  supple and no masses.   Lungs:  normal respiratory effort and normal breath sounds.   Heart:  normal rate  and regular rhythm.   Abdomen:  soft and normal bowel sounds.  but with surprising mod tenderness to LUQ without guarding or rebound, o/w no HSM Msk:  no joint tenderness and no joint swelling.   Extremities:  no edema, no erythema  Neurologic:  strength normal in all extremities and gait normal.   Skin:  color normal and no rashes.   Psych:  not depressed appearing and slightly anxious.     Impression & Recommendations:  Problem # 1:  Preventive Health Care (ICD-V70.0) Overall doing well, age appropriate education and counseling updated, referral for preventive services and immunizations addressed, dietary counseling and smoking status adressed , most recent labs reviewed, ecg reviewed I have personally reviewed and have noted 1.The patient's medical and social history 2.Their use of alcohol, tobacco or illicit drugs 3.Their current medications and supplements 4. Functional ability including ADL's, fall risk, home  safety risk, hearing & visual impairment  5.Diet and physical activities 6.Evidence for depression or mood disorders The patients weight, height, BMI  have been recorded in the chart I have made referrals, counseling and provided education to the patient based review of the above  Orders: EKG w/ Interpretation (93000) T-Bone Densitometry [id number]) T-Lumbar Vertebral Assessment [id number])  Problem # 2:  ABDOMINAL PAIN OTHER SPECIFIED SITE (ICD-789.09)  Her updated medication list for this problem includes:    Tramadol Hcl 50 Mg Tabs (Tramadol hcl) .Marland Kitchen... 1 by mouth q 6 hrs as needed pain left mid and upper with signficiant tenderness, recent onset - suspect early diverticulitis, had colonscopy last yr;  ok to hold on CT (declines due to cost anyway), will tx empirically with cipro/flagyl, and f/u worsening symtpom  Orders: TLB-Udip w/ Micro (81001-URINE) TLB-Lipase (83690-LIPASE)  Problem # 3:  DIABETES MELLITUS, TYPE II (ICD-250.00)  Her updated medication list for this problem includes:    Lisinopril 20 Mg Tabs (Lisinopril) .Marland Kitchen... Take 1 tablet by mouth once a day    Metformin Hcl 500 Mg Tabs (Metformin hcl) .Marland Kitchen... 2 by mouth two times a day    Glimepiride 4 Mg Tabs (Glimepiride) .Marland Kitchen... 1po once daily  Labs Reviewed: Creat: 0.8 (08/07/2009)    Reviewed HgBA1c results: 9.0 (08/07/2009)  6.8 (10/15/2008) stable overall by hx and exam, ok to continue meds/tx as is   Orders: TLB-BMP (Basic Metabolic Panel-BMET) (99991111) TLB-A1C / Hgb A1C (Glycohemoglobin) (83036-A1C) TLB-Lipid Panel (80061-LIPID) stable overall by hx and exam, ok to continue meds/tx as is   Complete Medication List: 1)  Furosemide 40 Mg Tabs (Furosemide) .Marland Kitchen.. 1 tablet by mouth once a day 2)  Lisinopril 20 Mg Tabs (Lisinopril) .... Take 1 tablet by mouth once a day 3)  Meclizine Hcl 25 Mg Tabs (Meclizine hcl) .Marland Kitchen.. 1 by mouth q 6 hrs as needed dizziness 4)  Metformin Hcl 500 Mg Tabs (Metformin hcl) .... 2 by mouth  two times a day 5)  Omeprazole 20 Mg Cpdr (Omeprazole) .... 2 by mouth once daily 6)  Paroxetine Hcl 20 Mg Tabs (Paroxetine hcl) .... Take 1 by mouth once daily 7)  Estradiol 1 Mg Tabs (Estradiol) .Marland Kitchen.. 1 by mouth once daily 8)  Promethazine Hcl 25 Mg Tabs (Promethazine hcl) .Marland Kitchen.. 1 by mouth q 6hrs as needed nausea 9)  Glimepiride 4 Mg Tabs (Glimepiride) .Marland Kitchen.. 1po once daily 10)  Tramadol Hcl 50 Mg Tabs (Tramadol hcl) .Marland Kitchen.. 1 by mouth q 6 hrs as needed pain 11)  Lovastatin 40 Mg Tabs (Lovastatin) .Marland Kitchen.. 1 by mouth once daily 12)  Ciprofloxacin  Hcl 500 Mg Tabs (Ciprofloxacin hcl) .Marland Kitchen.. 1po two times a day 13)  Metronidazole 250 Mg Tabs (Metronidazole) .Marland Kitchen.. 1 by mouth four times per day for 10 days  Other Orders: Tdap => 44yrs IM [id number]) Pneumococcal Vaccine [phone]) Admin 1st Vaccine [id number]) Admin of Any Addtl Vaccine [id number])  Patient Instructions: 1)  you had the tetanus and pneumonia shots today 2)  Please schedule the bone density test for october 2012 3)  Please go to the Lab in the basement for your blood and/or urine tests today  4)  Please call the number on the [place] for results of your testing 5)  treat as above, f/u any worsening signs or symptoms  6)  Please take all new medications as prescribed  7)  Continue all previous medications as before this visit  - you are given all the refills today 8)  Please schedule a follow-up appointment in October 2012  Prescriptions: METRONIDAZOLE 250 MG TABS (METRONIDAZOLE) 1 by mouth four times per day for 10 days  #40 x 0   Entered and Authorized by:   Biagio Borg MD   Signed by:   Biagio Borg MD on 10/24/2010   Method used:   Print then Give to Patient   RxID:   [id number] CIPROFLOXACIN HCL 500 MG TABS (CIPROFLOXACIN HCL) 1po two times a day  #20 x 0   Entered and Authorized by:   Biagio Borg MD   Signed by:   Biagio Borg MD on 10/24/2010   Method used:   Print then Give to Patient   RxID:   [id number] LOVASTATIN 40 MG  TABS (LOVASTATIN) 1 by mouth once daily  #90 x 3   Entered and Authorized by:   Biagio Borg MD   Signed by:   Biagio Borg MD on 10/24/2010   Method used:   Print then Give to Patient   RxID:   [id number] TRAMADOL HCL 50 MG TABS (TRAMADOL HCL) 1 by mouth q 6 hrs as needed pain  #60 x 1   Entered and Authorized by:   Biagio Borg MD   Signed by:   Biagio Borg MD on 10/24/2010   Method used:   Print then Give to Patient   RxID:   [id number] GLIMEPIRIDE 4 MG TABS (GLIMEPIRIDE) 1po once daily  #90 x 3   Entered and Authorized by:   Biagio Borg MD   Signed by:   Biagio Borg MD on 10/24/2010   Method used:   Print then Give to Patient   RxID:   [id number] PROMETHAZINE HCL 25 MG TABS (PROMETHAZINE HCL) 1 by mouth q 6hrs as needed nausea  #40 x 1   Entered and Authorized by:   Biagio Borg MD   Signed by:   Biagio Borg MD on 10/24/2010   Method used:   Print then Give to Patient   [id number] ESTRADIOL 1 MG TABS (ESTRADIOL) 1 by mouth once daily  #90 x 3   Entered and Authorized by:   Biagio Borg MD   Signed by:   Biagio Borg MD on 10/24/2010   Method used:   Print then Give to Patient   RxID:   [id number] PAROXETINE HCL 20 MG TABS (PAROXETINE HCL) take 1 by mouth once daily  #90 x 3   Entered and Authorized by:   Biagio Borg MD   Signed by:   Hunt Oris  John MD on 10/24/2010   Method used:   Print then Give to Patient   RxID:   [id number] OMEPRAZOLE 20 MG CPDR (OMEPRAZOLE) 2 by mouth once daily  #60 x 11   Entered and Authorized by:   Biagio Borg MD   Signed by:   Biagio Borg MD on 10/24/2010   Method used:   Print then Give to Patient   RxID:   [id number] METFORMIN HCL 500 MG TABS (METFORMIN HCL) 2 by mouth two times a day  #120 x 11   Entered and Authorized by:   Biagio Borg MD   Signed by:   Biagio Borg MD on 10/24/2010   Method used:   Print then Give to Patient   RxID:   [id number] MECLIZINE HCL 25 MG TABS  (MECLIZINE HCL) 1 by mouth q 6 hrs as needed dizziness  #60 x 1   Entered and Authorized by:   Biagio Borg MD   Signed by:   Biagio Borg MD on 10/24/2010   Method used:   Print then Give to Patient   RxID:   [id number] LISINOPRIL 20 MG TABS (LISINOPRIL) Take 1 tablet by mouth once a day  #90 x 3   Entered and Authorized by:   Biagio Borg MD   Signed by:   Biagio Borg MD on 10/24/2010   Method used:   Print then Give to Patient   RxID:   [id number] FUROSEMIDE 40 MG TABS (FUROSEMIDE) 1 tablet by mouth once a day  #90 x 3   Entered and Authorized by:   Biagio Borg MD   Signed by:   Biagio Borg MD on 10/24/2010   Method used:   Print then Give to Patient   RxID:   [id number]    Orders Added: 1)  EKG w/ Interpretation [93000] 2)  T-Bone Densitometry [77080] 3)  T-Lumbar Vertebral Assessment [77082] 4)  Tdap => 36yrs IM [id number] 5)  Pneumococcal Vaccine [90732] 6)  Admin 1st Vaccine [id number] 7)  Admin of Any Addtl Vaccine [90472] 8)  TLB-BMP (Basic Metabolic Panel-BMET) 123456 9)  TLB-A1C / Hgb A1C (Glycohemoglobin) [83036-A1C] 10)  TLB-Lipid Panel [80061-LIPID] 11)  TLB-Udip w/ Micro [81001-URINE] 12)  TLB-Lipase [83690-LIPASE] 13)  Est. Patient 40-64 years [id number]   Immunizations Administered:  Tetanus Vaccine:    Vaccine Type: Tdap    Site: left deltoid    Mfr: Sanofi Pasteur    Dose: 0.5 ml    Route: IM    Given by: Shirlean Mylar Ewing CMA ([place])    Exp. Date: 10/02/2011    Lot #: [id number]    VIS given: 07/18/08 version given October 24, 2010.  Pneumonia Vaccine:    Vaccine Type: Pneumovax    Site: right deltoid    Mfr: Merck    Dose: 0.5 ml    Route: IM    Given by: Shirlean Mylar Ewing CMA ([place])    Exp. Date: 01/23/2012    Lot #: [id number]    VIS given: 08/05/09 version given October 24, 2010.   Immunizations Administered:  Tetanus Vaccine:    Vaccine Type: Tdap    Site: left deltoid    Mfr: Sanofi Pasteur    Dose: 0.5 ml    Route: IM     Given by: Shirlean Mylar Ewing CMA ([place])    Exp. Date: 10/02/2011    Lot #: [id number]    VIS given: 07/18/08 version given October 24, 2010.  Pneumonia Vaccine:  Vaccine Type: Pneumovax    Site: right deltoid    Mfr: Merck    Dose: 0.5 ml    Route: IM    Given by: Shirlean Mylar Ewing CMA ([place])    Exp. Date: 01/23/2012    Lot #: [id number]    VIS given: 08/05/09 version given October 24, 2010.

## 2010-11-13 LAB — GLUCOSE, CAPILLARY: Glucose-Capillary: 248 mg/dL — ABNORMAL HIGH (ref 70–99)

## 2010-12-16 ENCOUNTER — Other Ambulatory Visit: Payer: Self-pay | Admitting: Internal Medicine

## 2011-01-16 NOTE — H&P (Signed)
NAME:  Cindy Larson, Cindy Larson            ACCOUNT NO.:  [id number]   MEDICAL RECORD NO.:  [id number]          PATIENT TYPE:  INP   LOCATION:  [id number]                         FACILITY:  [facility]   PHYSICIAN:  Lauretta Grill, M.D.    DATE OF BIRTH:  February 18, 1946   DATE OF ADMISSION:  12/14/2006  DATE OF DISCHARGE:  12/18/2006                              HISTORY & PHYSICAL   CHIEF COMPLAINT:  Degenerative joint disease, left knee.   End of dictation.      Billey Chang, P.A.    ______________________________  Clayton Bibles. Hiram Comber, M.D.    CL/MEDQ  D:  12/20/2006  T:  12/20/2006  Job:  [id number]

## 2011-01-16 NOTE — Op Note (Signed)
NAME:  Cindy, Larson            ACCOUNT NO.:  [id number]   MEDICAL RECORD NO.:  [id number]          PATIENT TYPE:  INP   LOCATION:  0002                         FACILITY:  [facility]   PHYSICIAN:  Lauretta Grill, M.D.    DATE OF BIRTH:  1946-01-04   DATE OF PROCEDURE:  12/14/2006  DATE OF DISCHARGE:                               OPERATIVE REPORT   CONTINUATION:  I was in the body of the procedure, and to continue:  We  then held the knee in 35 degrees of flexion until the cement cured.  We  cemented on the patella component, impacted it and removed excess cement  and held it with a clamp.  Additional jet lavage was carried out.  We  then held it until the cement was cured and the tourniquet was let down.  Bleeding points were cauterized and 10 mL of FloSeal was placed for  anticoagulation in and about the wound.  Hemovac drains were placed in  the medial and lateral gutter and brought out the superior lateral  portal.  The wound was then closed in layers with #1 Vicryl interrupted  figure-of-eight sutures on the retinaculum, with a running locking  oversew of #1 PDS, 0, 2-0 and 3-0 Vicryl on the subcu and skin stapled.  Hemovacs were hooked up to Autovac, a bulky sterile compressive dressing  was applied with a knee immobilizer.  The patient then, having tolerated  the procedure well, was awakened and taken to the recovery room in  satisfactory condition, to be admitted for routine postoperative care  and CPM.   Please note that this was the additional dictation where I had cut off  my previous dictation.           ______________________________  V. Hiram Comber, M.D.     VEP/MEDQ  D:  12/14/2006  T:  12/14/2006  Job:  [id number]

## 2011-01-16 NOTE — Op Note (Signed)
NAME:  Cindy Larson, Cindy Larson            ACCOUNT NO.:  [id number]   MEDICAL RECORD NO.:  [id number]          PATIENT TYPE:  AMB   LOCATION:  DAY                          FACILITY[facility]   PHYSICIAN:  Fenton Malling. Lucia Gaskins, M.D.  DATE OF BIRTH:  11/13/1945   DATE OF PROCEDURE:  07/29/2004  DATE OF DISCHARGE:                                 OPERATIVE REPORT   PREOPERATIVE DIAGNOSIS:  Chronic cholecystitis, cholelithiasis.   POSTOPERATIVE DIAGNOSIS:  Chronic cholecystitis, cholelithiasis, and small  bowel adhesions to lower anterior abdominal wall.   PROCEDURE:  Laparoscopic cholecystectomy with intraoperative cholangiogram.   SURGEON:  Fenton Malling. Lucia Gaskins, M.D.   FIRST ASSISTANT:  Orson Ape. Rise Patience, M.D.   ANESTHESIA:  General endotracheal.   ESTIMATED BLOOD LOSS:  Minimal.   INDICATION FOR PROCEDURE:  Cindy Larson is a 65 year old black female who  has had vague epigastric discomfort.  She had an ultrasound which showed  multiple gallstones and now comes for attempted laparoscopic  cholecystectomy.   The indications and potential complications have been explained to the  patient.  Potential complications include but are not limited to bleeding,  infection, bile duct injury, and the possibility of open surgery.   OPERATIVE NOTE:  The patient is placed in a supine position, given a general  endotracheal anesthetic.  She had 1 g of Ancef preoperatively.  Her abdomen  was prepped with Betadine solution and sterilely draped.  She has had  multiple lower abdominal operations, and for this reason I went  supraumbilical and got into the abdominal cavity with an 11 mm Hasson trocar  secured with a 0 Vicryl suture.  Laparoscopic exploration revealed the right  and left lobes of the liver were unremarkable.  The anterior wall of the  stomach was unremarkable.  When I swung the scope down toward the pelvis,  however, she did have multiple small bowel loops attached to the anterior  abdominal  wall, and I actually took pictures of these and included these in  her chart.   I then placed three additional trocars, a 10 mm subxiphoid trocar, a 5 mm  right midsubcostal, and a 5 mm right lateral subcostal trocar, and these  were [employer] trocars.  The gallbladder was grasped.  There were  noted to be adhesions up to one-half of the anterior wall of the  gallbladder, which were stripped down.  These were mainly the duodenum stuck  up to the gallbladder.  Sharp dissection was then carried down, identifying  the cystic artery, which was doubly endoclipped and divided at the cystic  duct and a clip was placed on the gallbladder side of the cystic duct.   An intraoperative cholangiogram was then obtained.  The intraoperative  cholangiogram was obtained using a cut-off Taut catheter inserted through a  14-gauge Jelco catheter in the right upper quadrant and this catheter  inserted to the side of the cut cystic duct.  The Taut catheter was secured  with an Endoclip.  I injected about 4-5 mL of half-strength Hypaque  solution, showing free flow of contast down the cystic duct, into the common  bile  duct, into the duodenum, and up the hepatic radicles.  There was no  filling defect, no masses.  This was felt to be a normal intraoperative  cholangiogram.   The Taut catheter was then removed, the cystic duct triply endoclipped and  divided.   The gallbladder was then sharply and bluntly dissected from the gallbladder  bed using primarily hook Bovie coagulation.  After complete division of the  gallbladder from the gallbladder bed, I revisualized the gallbladder and  irrigated the bed.  There was no bleeding or bile leak.  The gallbladder was  then placed in an EndoCatch bag and delivered through the umbilicus.   The umbilical port was closed with a 0 Vicryl suture.  All the trocar sites  were visualized, and there was no bleeding from any of the trocar sites as  these were removed.   The skin at each site was closed with a 5-0 Vicryl  suture, painted with tincture of Benzoin and steri-stripped.   The patient tolerated the procedure well.  Sponge and needle count were  correct at the end of the case.  She was transferred to the recovery room in  good condition.     Koren Shiver   DHN/MEDQ  D:  07/29/2004  T:  07/29/2004  Job:  [id number]   cc:   Biagio Borg, M.D. [facility]   John N. [name], M.D. [facility]

## 2011-01-16 NOTE — Discharge Summary (Signed)
NAME:  Cindy Larson, Cindy Larson            ACCOUNT NO.:  [id number]   MEDICAL RECORD NO.:  [id number]          PATIENT TYPE:  INP   LOCATION:  [id number]                         FACILITY:  [facility]   PHYSICIAN:  Lauretta Grill, M.D.    DATE OF BIRTH:  [birth year]   DATE OF ADMISSION:  12/14/2006  DATE OF DISCHARGE:  12/18/2006                               DISCHARGE SUMMARY   DISCHARGE DIAGNOSES:  1. End-stage degenerative joint disease of the left knee.  2. Diabetes mellitus type 2.  3. Hypertension.  4. Gastroesophageal reflux disease.  5. Postoperative blood loss anemia.  6. Hyperlipidemia.  7. Hypotension, resolved.  8. Tachycardia, resolved.  9. Post-menopausal.   PROCEDURE:  On aril 15, 2008, a left total knee arthroplasty.  Surgeon  was Dr. Lauretta Grill.   HISTORY:  This is a 65 year old 1 female who is followed  by Dr. Eddie Dibbles for degenerative joint disease of the bilateral knees.  She  has significant disease of the left knee with continuing pain.  She was  having pain with walking.  She could not walk more than one block  without having pain and having to stop.  She was having pain at night as  well.  Because of these symptoms, she wanted to have a total knee  arthroplasty.  This was advised and subsequently scheduled.  The patient  subsequently was admitted to [facility] for a total knee  arthroplasty.   HOSPITAL COURSE:  The patient was admitted on December 14, 2006.  She  underwent a left total knee arthroplasty.  She tolerated the procedure  well.  No intraoperative complications occurred.  Postoperatively the  patient's hospital course was complicated by a falling hemoglobin and  hematocrit, secondary to acute blood loss anemia.  She did have  tachycardia.  She did have an episode of hypertension which resolved.  She also had some lethargy.  She was given Narcan for this.  Apparently  the dose from the PCA pump was a little too much for her and she became  slightly lethargic.  Subsequently she was given Narcan for this and this  resolved.  She had some hypotension and anemia.  She was given a bolus  of fluid for her hypotension and subsequently given 2 units of blood for  her anemia.  Her hemoglobin had been 7.9.  After the bolus obviously  would go lower, so subsequently she was given 2 units of blood.  Her  hemoglobin improved subsequently.  She was 9.7 on December 17, 2006.  At  that time she was doing quite well.  Blood pressure was 108/59.  Her  tachycardia had resolved.  Her heart rate was 94.  She was breathing on  room air at 93% O2 without difficulty.  Her episodes of hypotension were  significant but not dangerously low.  She had 75/47 on December 16, 2006.  At this time she was given the bolus of fluid plus the 2 units of blood,  which helped immensely.  She otherwise was doing well.  No other  untoward events occurred during her stay.  She worked with  physical  therapy and is doing well.  She was able to get out of bed on the  evening of December 17, 2006, without assistance and able to get to the  restroom.   This is my criteria for her discharge.  At this point she is doing well  otherwise and is wanting to go home and subsequently she is prepared for  discharge.   DISCHARGE MEDICATIONS:  1. Premarin 0.125 mg q.d.  2. Metformin 1000 mg b.i.d.  3. Paxil 20 mg b.i.d.  4. Zocor 40 mg q.h.s.  5. Lisinopril 20 mg q.d.  6. Lasix 40 mg q.d.  7. Lovenox 30 mg subcu q.4h. x16 doses.  8. Percocet 5/325 mg, one to two p.o. q.4-6h. p.r.n. pain.   DISCHARGE PHYSICAL EXAMINATION:  GENERAL:  A well-developed and well-  nourished, moderately obese 65 year old African/American female.  HEENT:  Normocephalic and atraumatic.  EOMI.  Pupils equal, round,  reactive to light and accommodation.  Oropharynx clear.  Mucous  membranes pink and moist.  NECK:  Supple without jugular venous distention, lymphadenopathy or  thyromegaly.  No carotid bruits  noted.  Trachea midline.  CHEST:  Symmetrical on inspiration and clear to auscultation.  No  wheeze, rhonchi or rales noted.  CARDIOVASCULAR:  A regular rate and rhythm without murmur, rub or  gallop.  ABDOMEN:  Soft, bowel sounds present.  No palpable pulsatile masses.  No  hepatosplenomegaly.  Nontender, with no herniae.  GENITOURINARY:  Deferred.  RECTAL:  Deferred.  EXTREMITIES:  Without clubbing, cyanosis or edema.  Left leg had a  surgical incision noted which is healing satisfactorily without any  signs of infection.  Peripheral pulses intact.  NEUROLOGIC:  Grossly intact.  Cranial nerves II-XII  grossly intact  without focal deficits.  Muscle strength equal bilaterally.   DISPOSITION:  At this time the patient is ready for discharge.  Subsequently discharged home on December 18, 2006, in satisfactory and  stable condition.   FOLLOWUP:  She will follow up with Dr. Eddie Dibbles in one week.      Billey Chang, P.A.    ______________________________  Clayton Bibles. Hiram Comber, M.D.    CL/MEDQ  D:  12/17/2006  T:  12/18/2006  Job:  [id number]

## 2011-01-16 NOTE — Op Note (Signed)
NAME:  Cindy Larson, Cindy Larson            ACCOUNT NO.:  [id number]   MEDICAL RECORD NO.:  [id number]          PATIENT TYPE:  INP   LOCATION:  0002                         FACILITY:  [facility]   PHYSICIAN:  Lauretta Grill, M.D.    DATE OF BIRTH:  1946/07/07   DATE OF PROCEDURE:  12/14/2006  DATE OF DISCHARGE:                               OPERATIVE REPORT   PREOPERATIVE DIAGNOSIS:  End-stage degenerative joint disease, left  knee.   POSTOPERATIVE DIAGNOSIS:  End-stage degenerative joint disease, left  knee.   PROCEDURE:  Left total knee arthroplasty using cemented DePuy components  LCS type with rotating platform with MBT stem.   SURGEON:  Maia Breslow, M.D.   ASSISTANT:  Billey Chang, P.A.   ANESTHESIA:  General with LMA with preoperative femoral nerve block.   CULTURES:  None.   DRAINS:  Two medium Hemovac's to self suction.   ESTIMATED BLOOD LOSS:  100 mL.   REPLACEMENT:  Without.   TOURNIQUET TIME:  62 minutes.   PATHOLOGIC FINDINGS AND HISTORY:  Cindy Larson is an old patient of  mine, 65 years old, with bilateral knee pain, degenerative joint disease  with narrowing on the medial compartment, not much pain with respect to  catching and giving way.  We gave her options of knee arthroscopy, but  felt that she had only about a 50% chance with her knee x-rays of doing  well with debridement alone.  At surgery she did indeed have  tricompartmental disease.  We ultimately fitted her to a standard left  femur, a standard 10-mm insert, a size 2 MBT tibial tray, a 12  x 75  stem and a 32 mm patella button, all poly.  We elected the MBT stem due  to somewhat high body mass index.  We had excellent fit and fill with  full extension.  We did do a lateral retinacular release.  The patella  tracked well with good stability in flexion and extension.   PROCEDURE:  With adequate anesthesia obtained using femoral nerve block  and LMA technique, the patient was placed in the supine  position.  After  standard prepping and draping the left lower extremity was prepped from  the toes to the tourniquet in the standard fashion.  Esmarch  exsanguination was then used.  The tourniquet was let up to 350 mmHg.  A  median parapatellar skin incision was followed by a median parapatellar  retinacular incision. The incision was deepened sharply with a knife and  hemostasis obtained using the Bovie electrocoagulator.  The patella was  everted, the fat pad was excised as well as both cruciates and the  menisci.  The tibial spine was then sawed flat.  We then placed an  intermedullary guide and reamed up to a 13. We did not think we could go  passed.  The tibial cutting jig was then put in place and the first cut  made.  We felt it would be a bit tight so cut five more.  Appropriate  alignment was obtained with the alignment guide.   I then sized the femur to a standard, placed  the anterior-posterior  cutting jig in place with the intermedullary guide, set it with a 12 C  clamp.  Felt it to be a bit tight, moved it up 2 mm, set it again. I  made the anterior and posterior cuts which was flush anteriorly and fit  the 10 on flexion with the appropriate amount of tension.  An additional  ligament release was not necessary.  We then placed a 4 degree valgus  distal femoral cutting jig in place with the intramedullary guide.  We  set it back 2.5 mm and made the cut and perfectly fit 10 mm in extension  and flexion.   I then placed the finishing guide in place by the anterior-posterior  chamfer cuts as well as the notch cut.  I then exposed the proximal  tibia size to 2.  I then reamed it proximally and made the ream with  the 12 and then placed a #2 tibia with a 12 x 75 stem, impacted it down  and then made the broach cut.  I then placed the trial 10 rotating  platform in place and the standard femur and articulated the knee  through a range of motion.  I then calipered the patella 25,  cut it free  hand down to a 15.  It was very thick.  I placed the 32 mm template on,  made the three holes, and placed the button in place to articulate the  knee through a range of motion.   A lateral release was carried out.  All trial components were then  removed as the implanted materials were checked for sizing as they came  on the field and cement was mixed with tobramycin, two batches, with  1.45 grams of tobramycin per batch.  We then thoroughly jet lavaged the  knee also.  I then cemented on the tibial component, impacted it,  removed excess cement.  Cement was carried down to the flutes.  We then  placed a rotating platform 10 mm.  We then cemented on the femoral  component, impacted it, removed the excess cement and then held it in  full extension and removed excess cement and then held it at about 30  degrees of flexion while the cement cured.           ______________________________  V. Hiram Comber, M.D.     VEP/MEDQ  D:  12/14/2006  T:  12/14/2006  Job:  [id number]

## 2011-01-16 NOTE — H&P (Signed)
[place]. [facility]  Patient:    Cindy Larson, Cindy Larson                   MRN: [id number] Adm. Date:  [id number] Attending:  Katy Apo CC:         Biagio Borg, M.D. [facility]   History and Physical  BRIEF HISTORY:  Ms. Pinto is a 65 year old female who has had intermittent modest chest discomfort over the past 1 to 2 weeks.  She woke up this morning with more intense epigastric/chest discomfort which radiated to her back. This is not associated with shortness of breath, or nausea or vomiting.  The discomfort has never been changed by exertion and she mostly notices it at rest.  This morning she describes the pain as intense and may have even "doubled her over."  She states the pain is better now but she has some residual "chest pressure."  RISK FACTORS FOR CORONARY DISEASE:  Include hyperlipidemia but no other risk factor is identified.  PAST MEDICAL HISTORY:  Significant for gastroesophageal reflux disease, hiatal hernia, C-section x 1, rotator cuff surgery on the left.  MEDICATIONS: 1. Prilosec 20 mg p.o. q. day. 2. Hormone replacement therapy. 3. Hydrochlorothiazide unknown dose).  She takes this for swelling.  FAMILY HISTORY:  Mother alive with hypertension.  She takes Coumadin for something but otherwise is healthy.  Father deceased with alcoholism.  She has 1 brother deceased with MVA, two brothers alive and well but one with diabetes.  He has 1 sister with colon cancer or ovarian cancer.  SOCIAL HISTORY:  She is a nonsmoker.  She is married.  She does not drink alcohol.  REVIEW OF SYSTEMS:  She denies any lower abdominal pain, shortness of breath, paroxysmal nocturnal dyspnea, orthopnea, visual disturbance or neurologic defects.  She had an episode of left knee discomfort yesterday which has resolved.  She denies any other complaints on the review of systems.  PHYSICAL EXAMINATION:  VITAL SIGNS:  Blood pressure 145/70, temperature  98.3, pulse 70, respirations 14.  GENERAL:  She appears as a well-developed well-nourished, overweight female in no acute distress.  HEENT:  Atraumatic, normocephalic, extraocular muscles are intact.  NECK:  Supple without lymphadenopathy, thyromegaly, jugular venous distension or carotid bruits.  CHEST:  Clear to auscultation without any increased work of breathing.  CARDIAC:  S1, S2 are normal without murmurs or gallops.  There is no chest discomfort to palpation.  ABDOMEN:  Active bowel sounds.  Soft, nontender.  There is no hepatosplenomegaly.  No masses are palpated.  EXTREMITIES:  There is no clubbing, cyanosis, or edema.  NEUROLOGIC:  She is alert and oriented.  LABORATORY DATA:  UA is unremarkable. CBC significant for a white count of 3.3, platelet count of 236, hemoglobin 12.6, lipase is normal at 22, amylase is normal at 76.  EKG:  Demonstrates normal sinus rhythm with nonspecific T wave abnormality.  ASSESSMENT AND PLAN: 1. Chest pain:  Unknown etiology.  Although I do not think this is cardiac she    clearly needs further evaluation.  Her laboratories look okay so far.  She    needs to be ruled out for myocardial infarction with cardiac enzymes x 3.    Will also perform EKG in the morning.  I think as long as she rules out a    stress cardiolite tomorrow morning ought to be adequate.  Will treat with    aspirin for now, continue her proton pump inhibitor.  If pain recurs and is    intense may have to think of gallbladder as a possibility. DD:  09/27/00 TD:  09/27/00 Job: [id number]

## 2011-04-09 ENCOUNTER — Other Ambulatory Visit: Payer: Self-pay | Admitting: Internal Medicine

## 2011-04-09 DIAGNOSIS — Z1231 Encounter for screening mammogram for malignant neoplasm of breast: Secondary | ICD-10-CM

## 2011-05-05 ENCOUNTER — Other Ambulatory Visit: Payer: Self-pay

## 2011-05-05 MED ORDER — OMEPRAZOLE 20 MG PO CPDR: 20.0000 mg | DELAYED_RELEASE_CAPSULE | Freq: Two times a day (BID) | ORAL | Status: DC

## 2011-05-06 ENCOUNTER — Ambulatory Visit (HOSPITAL_COMMUNITY): Payer: Self-pay

## 2011-05-19 ENCOUNTER — Other Ambulatory Visit: Payer: Self-pay | Admitting: Internal Medicine

## 2011-05-21 ENCOUNTER — Other Ambulatory Visit (INDEPENDENT_AMBULATORY_CARE_PROVIDER_SITE_OTHER): Payer: Medicare Other

## 2011-05-21 ENCOUNTER — Encounter: Payer: Self-pay | Admitting: Internal Medicine

## 2011-05-21 ENCOUNTER — Ambulatory Visit (INDEPENDENT_AMBULATORY_CARE_PROVIDER_SITE_OTHER): Payer: Medicare Other | Admitting: Internal Medicine

## 2011-05-21 VITALS — BP 130/88 | HR 95 | Temp 98.1°F | Ht 61.0 in | Wt 179.0 lb

## 2011-05-21 DIAGNOSIS — E119 Type 2 diabetes mellitus without complications: Secondary | ICD-10-CM

## 2011-05-21 DIAGNOSIS — F411 Generalized anxiety disorder: Secondary | ICD-10-CM

## 2011-05-21 DIAGNOSIS — I10 Essential (primary) hypertension: Secondary | ICD-10-CM

## 2011-05-21 DIAGNOSIS — Z0000 Encounter for general adult medical examination without abnormal findings: Secondary | ICD-10-CM

## 2011-05-21 DIAGNOSIS — E785 Hyperlipidemia, unspecified: Secondary | ICD-10-CM

## 2011-05-21 LAB — LIPID PANEL
Cholesterol: 198 mg/dL (ref 0–200)
HDL: 51.2 mg/dL (ref >39.00)
LDL Cholesterol: 108 mg/dL — ABNORMAL HIGH (ref 0–99)
VLDL: 38.8 mg/dL (ref 0.0–40.0)

## 2011-05-21 LAB — BASIC METABOLIC PANEL
BUN: 21 mg/dL (ref 6–23)
Calcium: 9.7 mg/dL (ref 8.4–10.5)
Creatinine, Ser: 1.1 mg/dL (ref 0.4–1.2)
GFR: 65.48 mL/min (ref >60.00)
Glucose, Bld: 194 mg/dL — ABNORMAL HIGH (ref 70–99)
Potassium: 4 mEq/L (ref 3.5–5.1)

## 2011-05-21 LAB — HEMOGLOBIN A1C: Hgb A1c MFr Bld: 7.5 % — ABNORMAL HIGH (ref 4.6–6.5)

## 2011-05-21 MED ORDER — GLUCOSE BLOOD VI STRP: ORAL_STRIP | Status: DC

## 2011-05-21 MED ORDER — ALPRAZOLAM 0.25 MG PO TABS: 0.2500 mg | ORAL_TABLET | Freq: Two times a day (BID) | ORAL | Status: DC | PRN

## 2011-05-21 MED ORDER — ONETOUCH ULTRASOFT LANCETS MISC: Status: DC
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
## 2011-05-21 NOTE — Assessment & Plan Note (Signed)
stable overall by hx and exam, most recent data reviewed with pt, and pt to continue medical treatment as before  BP Readings from Last 3 Encounters:  05/21/11 130/88  10/24/10 112/72  08/07/09 114/72

## 2011-05-21 NOTE — Patient Instructions (Addendum)
You had the flu shot today Take all new medications as prescribed Continue all other medications as before Please go to LAB in the Basement for the blood and/or urine tests to be done today Please call the phone number [phone] (the [facility]) for results of testing in 2-3 days;  When calling, simply dial the number, and when prompted enter the MRN number above (the Medical Record Number) and the # key, then the message should start. You are given the new glucometer and supplies sent to pharmacy today Please return in 6 months with Lab testing done 3-5 days before Please see Lebron Conners, the Office manager at your convenience about the billing question  OK to cancel the oct 24 appt, and return in 6 months as above

## 2011-05-21 NOTE — Assessment & Plan Note (Signed)
stable overall by hx and exam, most recent data reviewed with pt, and pt to continue medical treatment as before  Lab Results  Component Value Date   HGBA1C 11.8* 10/24/2010
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
## 2011-05-21 NOTE — Assessment & Plan Note (Signed)
stable overall by hx and exam, most recent data reviewed with pt, and pt to continue medical treatment as before  Goal ldl < 70, last ldl 123 feb 2012

## 2011-05-27 ENCOUNTER — Other Ambulatory Visit: Payer: Self-pay | Admitting: Internal Medicine

## 2011-05-27 MED ORDER — GLIMEPIRIDE 4 MG PO TABS: ORAL_TABLET | ORAL | Status: DC

## 2011-05-27 MED ORDER — ATORVASTATIN CALCIUM 20 MG PO TABS: 20.0000 mg | ORAL_TABLET | Freq: Every day | ORAL | Status: DC

## 2011-06-03 ENCOUNTER — Ambulatory Visit (HOSPITAL_COMMUNITY)
Admission: RE | Admit: 2011-06-03 | Discharge: 2011-06-03 | Disposition: A | Discharge status: Home / Self Care | Payer: Medicare Other | Source: Ambulatory Visit | Attending: Internal Medicine | Admitting: Internal Medicine

## 2011-06-03 DIAGNOSIS — Z1231 Encounter for screening mammogram for malignant neoplasm of breast: Secondary | ICD-10-CM | POA: Insufficient documentation

## 2011-06-24 ENCOUNTER — Ambulatory Visit: Payer: Self-pay | Admitting: Internal Medicine

## 2011-08-31 ENCOUNTER — Other Ambulatory Visit: Payer: Self-pay | Admitting: Internal Medicine

## 2011-09-07 ENCOUNTER — Encounter (HOSPITAL_COMMUNITY): Payer: Self-pay

## 2011-09-07 ENCOUNTER — Emergency Department (HOSPITAL_COMMUNITY)
Admission: EM | Admit: 2011-09-07 | Discharge: 2011-09-07 | Disposition: A | Discharge status: Home / Self Care | Payer: Medicare Other | Source: Home / Self Care | Attending: Emergency Medicine | Admitting: Emergency Medicine

## 2011-09-07 ENCOUNTER — Emergency Department (INDEPENDENT_AMBULATORY_CARE_PROVIDER_SITE_OTHER): Payer: Medicare Other

## 2011-09-07 DIAGNOSIS — J40 Bronchitis, not specified as acute or chronic: Secondary | ICD-10-CM

## 2011-09-07 DIAGNOSIS — H109 Unspecified conjunctivitis: Secondary | ICD-10-CM

## 2011-09-07 DIAGNOSIS — J309 Allergic rhinitis, unspecified: Secondary | ICD-10-CM

## 2011-09-07 DIAGNOSIS — B340 Adenovirus infection, unspecified: Secondary | ICD-10-CM

## 2011-09-07 DIAGNOSIS — J329 Chronic sinusitis, unspecified: Secondary | ICD-10-CM

## 2011-09-07 DIAGNOSIS — R109 Unspecified abdominal pain: Secondary | ICD-10-CM

## 2011-09-07 MED ORDER — GUAIFENESIN-CODEINE 100-10 MG/5ML PO SYRP: 10.0000 mL | ORAL_SOLUTION | Freq: Four times a day (QID) | ORAL | Status: AC | PRN

## 2011-09-07 MED ORDER — AZITHROMYCIN 250 MG PO TABS: ORAL_TABLET | ORAL | Status: AC

## 2011-09-07 MED ORDER — POLYMYXIN B-TRIMETHOPRIM 10000-0.1 UNIT/ML-% OP SOLN: 1.0000 [drp] | OPHTHALMIC | Status: AC

## 2011-09-07 NOTE — ED Notes (Signed)
C/o sore throat, productive cough of yellow sputum, runny nose, nasal congestion , headache, sinus pressure and drainage from both eyes.  Sx started 08/31/11.   States she last had tylenol at 6pm tonight and cannot take NSAIDS.

## 2011-09-07 NOTE — ED Provider Notes (Signed)
Chief Complaint  Patient presents with  . URI  . Fever    History of Present Illness:  The patient has had a one-week history of redness of both eyes with both tearing and some yellow drainage. She denies any blurred vision. She's also had a sore throat, nasal congestion with yellow rhinorrhea, headache, cough productive of green sputum, her neck is sore, chest is sore, and she's had a fever of up to 101. She's had a few loose stools. She has been exposed to the flu, but is gotten the flu vaccine this year.  Review of Systems:  Other than noted above, the patient denies any of the following symptoms. Systemic:  No fever, chills, sweats, fatigue, myalgias, headache, or anorexia. Eye:  No redness, pain or drainage. ENT:  No earache, nasal congestion, rhinorrhea, sinus pressure, or sore throat. Lungs:  No cough, sputum production, wheezing, shortness of breath. Or chest pain. GI:  No nausea, vomiting, abdominal pain or diarrhea. Skin:  No rash or itching.  [place]:  Past medical history, family history, social history, meds, and allergies were reviewed.  Physical Exam:   Vital signs:  BP 143/76  Pulse 123  Temp(Src) 101.6 F (38.7 C) (Oral)  Resp 16  SpO2 99% General:  Alert, in no distress. Eye:  Conjunctivae was were injected, there was some yellowish drainage in the left eye, corneas were intact, anterior chambers were normal, lids and periorbital tissues are normal. ENT:  TMs and canals were normal, without erythema or inflammation.  Nasal mucosa was clear and uncongested, without drainage.  Mucous membranes were moist.  Pharynx was erythematous, without exudate or drainage.  There were no oral ulcerations or lesions. Neck:  Supple, no adenopathy, tenderness or mass. Lungs:  No respiratory distress.  Lungs were clear to auscultation, without wheezes, rales or rhonchi.  Breath sounds were clear and equal bilaterally. Heart:  Regular rhythm, without gallops, murmers or rubs. Skin:  Clear,  warm, and dry, without rash or lesions.  Labs:    Radiology:  Dg Chest 2 View  09/07/2011  *RADIOLOGY REPORT*  Clinical Data: Cough and fever.  CHEST - 2 VIEW  Comparison: 03/08/2008  Findings: Two views of the chest were obtained.  There is mild volume loss with bibasilar atelectasis on the frontal image.  Upper lungs are clear.  Lung bases are clear on the lateral view.  Heart size is within normal limits.  IMPRESSION: Low lung volumes without focal disease.  Original Report Authenticated By: Markus Daft, M.D.    Medications given in [facility]:  None  Assessment:   Diagnoses that have been ruled out:  Diagnoses that are still under consideration:  Final diagnoses:  Adenovirus infection  Conjunctivitis  Sinusitis  Bronchitis     Plan:   1.  The following meds were prescribed:   New Prescriptions   AZITHROMYCIN (ZITHROMAX Z-PAK) 250 MG TABLET    Take as directed.   GUAIFENESIN-CODEINE (GUIATUSS AC) 100-10 MG/5ML SYRUP    Take 10 mLs by mouth 4 (four) times daily as needed for cough.   TRIMETHOPRIM-POLYMYXIN B (POLYTRIM) OPHTHALMIC SOLUTION    Place 1 drop into both eyes every 4 (four) hours.   2.  The patient was instructed in symptomatic care and handouts were given. 3.  The patient was told to return if becoming worse in any way, if no better in 3 or 4 days, and given some red flag symptoms that would indicate earlier return. 4.  I told the patient to followup  with her primary care doctor if no better in a week. I also suggested she use some moist warm compresses to the eyes.    Birdena Crandall, MD 09/07/11 2223

## 2011-10-07 ENCOUNTER — Ambulatory Visit: Payer: Medicare Other | Admitting: Internal Medicine

## 2011-10-09 ENCOUNTER — Ambulatory Visit (INDEPENDENT_AMBULATORY_CARE_PROVIDER_SITE_OTHER): Payer: Medicare Other | Admitting: Internal Medicine

## 2011-10-09 ENCOUNTER — Encounter: Payer: Self-pay | Admitting: Internal Medicine

## 2011-10-09 VITALS — BP 130/84 | HR 103 | Temp 97.8°F

## 2011-10-09 DIAGNOSIS — R221 Localized swelling, mass and lump, neck: Secondary | ICD-10-CM

## 2011-10-09 DIAGNOSIS — R220 Localized swelling, mass and lump, head: Secondary | ICD-10-CM

## 2011-10-09 DIAGNOSIS — M542 Cervicalgia: Secondary | ICD-10-CM

## 2011-10-09 DIAGNOSIS — J029 Acute pharyngitis, unspecified: Secondary | ICD-10-CM

## 2011-10-09 DIAGNOSIS — E119 Type 2 diabetes mellitus without complications: Secondary | ICD-10-CM

## 2011-10-09 MED ORDER — LEVOFLOXACIN 500 MG PO TABS: 500.0000 mg | ORAL_TABLET | Freq: Every day | ORAL | Status: AC

## 2011-10-09 MED ORDER — LIDOCAINE VISCOUS 2 % MT SOLN: 10.0000 mL | OROMUCOSAL | Status: AC | PRN

## 2011-10-09 NOTE — Progress Notes (Signed)
Subjective:    Patient ID: Cindy Larson, female    DOB: 08-15-1946, 66 y.o.   MRN: [id number]  Sore Throat  This is a new problem. The current episode started more than 1 month ago. The problem has been gradually worsening. The pain is worse on the left side. The maximum temperature recorded prior to her arrival was 100 - 100.9 F. The pain is at a severity of 6/10. Associated symptoms include neck pain (on left side) and swollen glands. Pertinent negatives include no congestion, coughing, diarrhea, ear discharge, hoarse voice, plugged ear sensation, shortness of breath, trouble swallowing (but pain swallowing) or vomiting. She has had exposure to strep. She has tried acetaminophen, cool liquids and gargles for the symptoms. The treatment provided mild relief.    Past Medical History  Diagnosis Date  . DIABETES MELLITUS, TYPE II   . HYPERLIPIDEMIA   . OBESITY   . ANXIETY   . DEPRESSION   . HYPERTENSION   . ALLERGIC RHINITIS   . GERD   . HIATAL HERNIA   . DIVERTICULOSIS, COLON   . OSTEOARTHRITIS     L knee  . OSTEOPENIA   . INTERMITTENT VERTIGO   . COLONIC POLYPS, HX OF   . Esophageal stricture   . Hypersomnolence   . Post-menopausal     hormone replacement therapy   Review of Systems  HENT: Positive for neck pain (on left side). Negative for congestion, hoarse voice, trouble swallowing (but pain swallowing) and ear discharge.   Respiratory: Negative for cough and shortness of breath.   Gastrointestinal: Negative for vomiting and diarrhea.       Objective:   Physical Exam BP 130/84  Pulse 103  Temp(Src) 97.8 F (36.6 C) (Oral)  SpO2 97% Wt Readings from Last 3 Encounters:  05/21/11 179 lb (81.194 kg)  10/24/10 179 lb (81.194 kg)  08/07/09 190 lb (86.183 kg)   Constitutional: She appears well-developed and well-nourished. No distress.  HENT: Head: Normocephalic and atraumatic. Ears: R TMs ok, no erythema or effusion; L TM mild clear effusion Nose: Nose normal.    Mouth/Throat: Oropharynx is red and inflammaed on L side, ?mild exudate and ulceration - R side clear and moist. No oral thrush Eyes: Conjunctivae and EOM are normal. Pupils are equal, round, and reactive to light. No scleral icterus.  Neck: mild soft tissue swelling and LAD on L side of neck; min tender but normal range of motion. Neck supple. No JVD present. No thyromegaly present.  Cardiovascular: Normal rate, regular rhythm and normal heart sounds.  No murmur heard. No BLE edema. Pulmonary/Chest: Effort normal and breath sounds normal. No respiratory distress. She has no wheezes.  Neurological: She is alert and oriented to person, place, and time. No cranial nerve deficit. Coordination normal.  Skin: Skin is warm and dry. No rash noted. No erythema.  Psychiatric: She has a normal mood and affect. Her behavior is normal. Judgment and thought content normal.   Lab Results  Component Value Date   WBC 5.4 10/24/2010   HGB 14.3 10/24/2010   HCT 42.3 10/24/2010   PLT 276 10/24/2010   GLUCOSE 194* 05/21/2011   CHOL 198 05/21/2011   TRIG 194.0* 05/21/2011   HDL 51.20 05/21/2011   LDLDIRECT 127.3 10/24/2010   LDLCALC 108* 05/21/2011   ALT 17 04/02/2008   AST 20 04/02/2008   NA 138 05/21/2011   K 4.0 05/21/2011   CL 101 05/21/2011   CREATININE 1.1 05/21/2011   BUN 21 05/21/2011  CO2 28 05/21/2011   TSH 2.33 04/02/2008   HGBA1C 7.5* 05/21/2011   MICROALBUR 4.6* 06/20/2007       Assessment & Plan:  Of acute pharyngitis with left-sided symptoms - swelling, ulceration and pain -  Treated with Z-Pak for bronchitis approximately one month ago - pulmonary symptoms have improved but residual neck and throat pain - unimproved with Tylenol for last 2 weeks Will treat for possible bacterial infection with Levaquin x10 days Recommended symptomatic relief with viscous lidocaine as needed and continue tramadol prn (started same in last 24 hours with improvement)  Patient to call if symptoms worse or unimproved  after antibiotics to consider imaging as needed

## 2011-10-09 NOTE — Assessment & Plan Note (Signed)
Lab Results  Component Value Date   HGBA1C 7.5* 05/21/2011   The current medical regimen is effective;  continue present plan and medications.

## 2011-10-09 NOTE — Patient Instructions (Signed)
It was good to see you today. Levaquin antibiotics daily x 10days - and sue lidocain gel as needed for throat pain in addition to tylenol and tramadol as needed Your prescription(s) have been submitted to your pharmacy. Please take as directed and contact our office if you believe you are having problem(s) with the medication(s). If symptoms are worse or unimproved after antibiotic therapy, call for other evaluation as needed

## 2011-10-16 ENCOUNTER — Other Ambulatory Visit: Payer: Self-pay | Admitting: Internal Medicine

## 2011-11-13 ENCOUNTER — Other Ambulatory Visit: Payer: Self-pay | Admitting: Internal Medicine

## 2011-11-19 ENCOUNTER — Other Ambulatory Visit (INDEPENDENT_AMBULATORY_CARE_PROVIDER_SITE_OTHER): Payer: Medicare Other

## 2011-11-19 ENCOUNTER — Ambulatory Visit (INDEPENDENT_AMBULATORY_CARE_PROVIDER_SITE_OTHER): Payer: Medicare Other | Admitting: Internal Medicine

## 2011-11-19 ENCOUNTER — Encounter: Payer: Self-pay | Admitting: Internal Medicine

## 2011-11-19 VITALS — BP 128/80 | HR 95 | Temp 97.0°F | Ht 60.0 in | Wt 177.5 lb

## 2011-11-19 DIAGNOSIS — E119 Type 2 diabetes mellitus without complications: Secondary | ICD-10-CM

## 2011-11-19 DIAGNOSIS — Z0000 Encounter for general adult medical examination without abnormal findings: Secondary | ICD-10-CM

## 2011-11-19 DIAGNOSIS — J309 Allergic rhinitis, unspecified: Secondary | ICD-10-CM

## 2011-11-19 LAB — BASIC METABOLIC PANEL
Calcium: 9.9 mg/dL (ref 8.4–10.5)
Creatinine, Ser: 1.1 mg/dL (ref 0.4–1.2)
GFR: 62.7 mL/min (ref >60.00)
Glucose, Bld: 145 mg/dL — ABNORMAL HIGH (ref 70–99)
Sodium: 136 mEq/L (ref 135–145)

## 2011-11-19 LAB — CBC WITH DIFFERENTIAL/PLATELET
Basophils Relative: 0.8 % (ref 0.0–3.0)
Eosinophils Relative: 3.2 % (ref 0.0–5.0)
HCT: 38.5 % (ref 36.0–46.0)
Hemoglobin: 12.7 g/dL (ref 12.0–15.0)
Lymphocytes Relative: 58.4 % — ABNORMAL HIGH (ref 12.0–46.0)
Lymphs Abs: 4 10*3/uL (ref 0.7–4.0)
Monocytes Relative: 6.8 % (ref 3.0–12.0)
Neutro Abs: 2.1 10*3/uL (ref 1.4–7.7)
RBC: 4.21 Mil/uL (ref 3.87–5.11)
RDW: 13.3 % (ref 11.5–14.6)
WBC: 6.8 10*3/uL (ref 4.5–10.5)

## 2011-11-19 LAB — HEPATIC FUNCTION PANEL
Albumin: 4 g/dL (ref 3.5–5.2)
Alkaline Phosphatase: 65 U/L (ref 39–117)

## 2011-11-19 LAB — URINALYSIS, ROUTINE W REFLEX MICROSCOPIC
Specific Gravity, Urine: >=1.03 (ref 1.000–1.030)
Total Protein, Urine: NEGATIVE
Urine Glucose: NEGATIVE
pH: 5.5 (ref 5.0–8.0)

## 2011-11-19 LAB — LIPID PANEL
Cholesterol: 131 mg/dL (ref 0–200)
HDL: 48.3 mg/dL (ref >39.00)
Triglycerides: 241 mg/dL — ABNORMAL HIGH (ref 0.0–149.0)

## 2011-11-19 LAB — LDL CHOLESTEROL, DIRECT: Direct LDL: 62.1 mg/dL

## 2011-11-19 LAB — MICROALBUMIN / CREATININE URINE RATIO: Creatinine,U: 340.3 mg/dL

## 2011-11-19 MED ORDER — FLUTICASONE PROPIONATE 50 MCG/ACT NA SUSP: 2.0000 | Freq: Every day | NASAL | Status: DC

## 2011-11-19 MED ORDER — OMEPRAZOLE 20 MG PO CPDR: 20.0000 mg | DELAYED_RELEASE_CAPSULE | Freq: Two times a day (BID) | ORAL | Status: DC

## 2011-11-19 MED ORDER — GLIMEPIRIDE 4 MG PO TABS: ORAL_TABLET | ORAL | Status: DC

## 2011-11-19 NOTE — Patient Instructions (Addendum)
Take all new medications as prescribed Continue all other medications as before Please go to LAB in the Basement for the blood and/or urine tests to be done today You will be contacted by phone if any changes need to be made immediately.  Otherwise, you will receive a letter about your results with an explanation. Your medications were refilled today as requested Please have the pharmacy call with any refills you may need. Please return in 6 mo with Lab testing done 3-5 days before

## 2011-11-22 ENCOUNTER — Encounter: Payer: Self-pay | Admitting: Internal Medicine

## 2011-11-22 NOTE — Assessment & Plan Note (Signed)

## 2011-11-22 NOTE — Assessment & Plan Note (Signed)
stable overall by hx and exam, most recent data reviewed with pt, and pt to continue medical treatment as before  Lab Results  Component Value Date   HGBA1C 7.3* 11/19/2011

## 2011-11-22 NOTE — Progress Notes (Signed)
Subjective:    Patient ID: Cindy Larson, female    DOB: 1946/05/17, 66 y.o.   MRN: [id number]  HPI  Here for wellness and f/u;  Overall doing ok;  Pt denies CP, worsening SOB, DOE, wheezing, orthopnea, PND, worsening LE edema, palpitations, dizziness or syncope.  Pt denies neurological change such as new Headache, facial or extremity weakness.  Pt denies polydipsia, polyuria, or low sugar symptoms. Pt states overall good compliance with treatment and medications, good tolerability, and trying to follow lower cholesterol diet.  Pt denies worsening depressive symptoms, suicidal ideation or panic. No fever, wt loss, night sweats, loss of appetite, or other constitutional symptoms.  Pt states good ability with ADL's, low fall risk, home safety reviewed and adequate, no significant changes in hearing or vision, and occasionally active with exercise.  Does have several wks ongoing nasal allergy symptoms with clear congestion, itch and sneeze, without fever, pain, ST, cough or wheezing. Past Medical History  Diagnosis Date  . DIABETES MELLITUS, TYPE II   . HYPERLIPIDEMIA   . OBESITY   . ANXIETY   . DEPRESSION   . HYPERTENSION   . ALLERGIC RHINITIS   . GERD   . HIATAL HERNIA   . DIVERTICULOSIS, COLON   . OSTEOARTHRITIS     L knee  . OSTEOPENIA   . INTERMITTENT VERTIGO   . COLONIC POLYPS, HX OF   . Esophageal stricture   . Hypersomnolence   . Post-menopausal     hormone replacement therapy   Past Surgical History  Procedure Date  . Abdominal hysterectomy   . Cesarean section   . Oophorectomy     one  . Rotator cuff repair     left  . Lysis of adhesions   . Joint replacement   . Cholecystectomy     reports that she has never smoked. She does not have any smokeless tobacco history on file. She reports that she does not drink alcohol or use illicit drugs. family history includes Atrial fibrillation in her mother; Cancer in her brother, mother, and sister; Diabetes in her other;  Heart disease in her mother; and Hypertension in her mother. Allergies  Allergen Reactions  . Aspirin   . Penicillins   . Simvastatin     REACTION: itch   Current Outpatient Prescriptions on File Prior to Visit  Medication Sig Dispense Refill  . ALPRAZolam (XANAX) 0.25 MG tablet Take 1 tablet (0.25 mg total) by mouth 2 (two) times daily as needed for anxiety.  60 tablet  1  . atorvastatin (LIPITOR) 20 MG tablet Take 1 tablet (20 mg total) by mouth daily.  90 tablet  3  . estradiol (ESTRACE) 1 MG tablet TAKE ONE TABLET BY MOUTH EVERY DAY  90 tablet  1  . furosemide (LASIX) 40 MG tablet Take 40 mg by mouth daily.        Marland Kitchen glimepiride (AMARYL) 4 MG tablet Take 1 pill by mouth in the AM, and 1/2 pill in the PM  135 tablet  3  . glucose blood (ONE TOUCH ULTRA TEST) test strip Use as directed 1 per day  100 each  5  . Lancets (ONETOUCH ULTRASOFT) lancets Use as directed 1 per day  100 each  5  . lisinopril (PRINIVIL,ZESTRIL) 20 MG tablet TAKE ONE TABLET BY MOUTH EVERY DAY  90 tablet  2  . meclizine (ANTIVERT) 25 MG tablet 1 by mouth every 6 hours as needed for dizziness       .  metFORMIN (GLUCOPHAGE) 500 MG tablet Take 1,000 mg by mouth 2 (two) times daily with a meal.        . omeprazole (PRILOSEC) 20 MG capsule Take 1 capsule (20 mg total) by mouth 2 (two) times daily.  180 capsule  3  . PARoxetine (PAXIL) 20 MG tablet Take 20 mg by mouth daily.        . promethazine (PHENERGAN) 25 MG tablet TAKE ONE TABLET BY MOUTH EVERY 6 HOURS AS NEEDED FOR NAUSEA  40 tablet  1  . traMADol (ULTRAM) 50 MG tablet Take 50 mg by mouth every 6 (six) hours as needed. For pain        Review of Systems Review of Systems  Constitutional: Negative for diaphoresis, activity change, appetite change and unexpected weight change.  HENT: Negative for hearing loss, ear pain, facial swelling, mouth sores and neck stiffness.   Eyes: Negative for pain, redness and visual disturbance.  Respiratory: Negative for shortness of  breath and wheezing.   Cardiovascular: Negative for chest pain and palpitations.  Gastrointestinal: Negative for diarrhea, blood in stool, abdominal distention and rectal pain.  Genitourinary: Negative for hematuria, flank pain and decreased urine volume.  Musculoskeletal: Negative for myalgias and joint swelling.  Skin: Negative for color change and wound.  Neurological: Negative for syncope and numbness.  Hematological: Negative for adenopathy.  Psychiatric/Behavioral: Negative for hallucinations, self-injury, decreased concentration and agitation.      Objective:   Physical Exam BP 128/80  Pulse 95  Temp(Src) 97 F (36.1 C) (Oral)  Ht 5' (1.524 m)  Wt 177 lb 8 oz (80.513 kg)  BMI 34.67 kg/m2  SpO2 95% Physical Exam  VS noted Constitutional: Pt is oriented to person, place, and time. Appears well-developed and well-nourished. Cindy Larson HENT:  Head: Normocephalic and atraumatic.  Right Ear: External ear normal.  Left Ear: External ear normal.  Nose: Nose normal.  Mouth/Throat: Oropharynx is clear and moist.  Eyes: Conjunctivae and EOM are normal. Pupils are equal, round, and reactive to light.  Neck: Normal range of motion. Neck supple. No JVD present. No tracheal deviation present.  Cardiovascular: Normal rate, regular rhythm, normal heart sounds and intact distal pulses.   Pulmonary/Chest: Effort normal and breath sounds normal.  Abdominal: Soft. Bowel sounds are normal. There is no tenderness.  Musculoskeletal: Normal range of motion. Exhibits no edema.  Lymphadenopathy:  Has no cervical adenopathy.  Neurological: Pt is alert and oriented to person, place, and time. Pt has normal reflexes. No cranial nerve deficit.  Skin: Skin is warm and dry. No rash noted.  Psychiatric:  Has  normal mood and affect. Behavior is normal.     Assessment & Plan:

## 2011-11-22 NOTE — Assessment & Plan Note (Signed)
Ok to add flonase asd

## 2011-11-30 ENCOUNTER — Other Ambulatory Visit: Payer: Self-pay | Admitting: Internal Medicine

## 2011-12-16 ENCOUNTER — Other Ambulatory Visit: Payer: Self-pay | Admitting: Internal Medicine

## 2012-01-20 ENCOUNTER — Other Ambulatory Visit: Payer: Self-pay | Admitting: Internal Medicine

## 2012-02-09 ENCOUNTER — Telehealth: Payer: Self-pay | Admitting: Internal Medicine

## 2012-02-09 ENCOUNTER — Encounter: Payer: Self-pay | Admitting: Internal Medicine

## 2012-02-09 ENCOUNTER — Ambulatory Visit (INDEPENDENT_AMBULATORY_CARE_PROVIDER_SITE_OTHER): Payer: Medicare Other | Admitting: Internal Medicine

## 2012-02-09 ENCOUNTER — Ambulatory Visit (INDEPENDENT_AMBULATORY_CARE_PROVIDER_SITE_OTHER)
Admission: RE | Admit: 2012-02-09 | Discharge: 2012-02-09 | Disposition: A | Discharge status: Home / Self Care | Payer: Medicare Other | Source: Ambulatory Visit | Attending: Internal Medicine | Admitting: Internal Medicine

## 2012-02-09 VITALS — BP 132/68 | HR 119 | Temp 102.2°F | Ht 61.0 in | Wt 175.1 lb

## 2012-02-09 DIAGNOSIS — I10 Essential (primary) hypertension: Secondary | ICD-10-CM

## 2012-02-09 DIAGNOSIS — J209 Acute bronchitis, unspecified: Secondary | ICD-10-CM

## 2012-02-09 DIAGNOSIS — R062 Wheezing: Secondary | ICD-10-CM

## 2012-02-09 DIAGNOSIS — E119 Type 2 diabetes mellitus without complications: Secondary | ICD-10-CM

## 2012-02-09 MED ORDER — METHYLPREDNISOLONE ACETATE 80 MG/ML IJ SUSP
120.0000 mg | Freq: Once | INTRAMUSCULAR | Status: AC
  Administered 2012-02-09: 120 mg via INTRAMUSCULAR

## 2012-02-09 MED ORDER — HYDROCODONE-HOMATROPINE 5-1.5 MG/5ML PO SYRP: 5.0000 mL | ORAL_SOLUTION | Freq: Four times a day (QID) | ORAL | Status: AC | PRN

## 2012-02-09 MED ORDER — LEVOFLOXACIN 250 MG PO TABS: 250.0000 mg | ORAL_TABLET | Freq: Every day | ORAL | Status: AC

## 2012-02-09 MED ORDER — PREDNISONE 10 MG PO TABS: ORAL_TABLET | ORAL | Status: DC

## 2012-02-09 NOTE — Telephone Encounter (Signed)
Caller: Malon/Mother; PCP: Cathlean Cower; CB#: [phone]; ; ; Call regarding Cough/Congestion; Onset- 02/04/12 Temp- 101  Pt states she has had cough, fever and nasal congestion for five days. States fever has been fluctuating between 100-102.  Emergent s/s of Cough protocol r/o. Pt to see provider within 4hrs. No appts seen, message to provider.

## 2012-02-09 NOTE — Patient Instructions (Addendum)
You had the steroid shot today Take all new medications as prescribed - the antibiotic, cough medicine, and prednisone for wheezing Continue all other medications as before Please go to XRAY in the Basement for the x-ray test You will be contacted by phone if any changes need to be made immediately.  Otherwise, you will receive a letter about your results with an explanation.

## 2012-02-09 NOTE — Telephone Encounter (Signed)
Pt will come this afternoon at 3:45.

## 2012-02-09 NOTE — Telephone Encounter (Signed)
[place] with me today if no other provider has appt

## 2012-02-10 ENCOUNTER — Other Ambulatory Visit: Payer: Self-pay | Admitting: Internal Medicine

## 2012-02-10 ENCOUNTER — Encounter: Payer: Self-pay | Admitting: Internal Medicine

## 2012-02-10 DIAGNOSIS — J189 Pneumonia, unspecified organism: Secondary | ICD-10-CM

## 2012-02-14 ENCOUNTER — Encounter: Payer: Self-pay | Admitting: Internal Medicine

## 2012-02-14 NOTE — Assessment & Plan Note (Signed)
stable overall by hx and exam, most recent data reviewed with pt, and pt to continue medical treatment as before BP Readings from Last 3 Encounters:  02/09/12 132/68  11/19/11 128/80  10/09/11 130/84

## 2012-02-14 NOTE — Assessment & Plan Note (Signed)
stable overall by hx and exam, most recent data reviewed with pt, and pt to continue medical treatment as before Lab Results  Component Value Date   HGBA1C 7.3* 11/19/2011

## 2012-02-14 NOTE — Assessment & Plan Note (Signed)
Mild to mod, for antibx course,  to f/u any worsening symptoms or concerns, cant r/o pna - for cxr

## 2012-02-14 NOTE — Assessment & Plan Note (Signed)
Mild to mod, for depomedrol IM, predpack asd,  to f/u any worsening symptoms or concerns 

## 2012-02-14 NOTE — Progress Notes (Signed)
Subjective:    Patient ID: Cindy Larson, female    DOB: 02/13/1946, 66 y.o.   MRN: [id number]  HPI  Here with acute onset mild to mod 5-6 days ST, HA, general weakness and malaise, with prod cough greenish sputum and temp up to 102.2 but Pt denies chest pain, increased sob or doe, wheezing, orthopnea, PND, increased LE swelling, palpitations, dizziness or syncope except for mild wheezing onset last 2-3 days.   Pt denies new neurological symptoms such as other new headache, or facial or extremity weakness or numbness   Pt denies polydipsia, polyuria, or low sugar symptoms such as weakness or confusion improved with po intake.  Pt states overall good compliance with meds, trying to follow lower cholesterol, diabetic diet, wt overall stable but little exercise however. Past Medical History  Diagnosis Date  . DIABETES MELLITUS, TYPE II   . HYPERLIPIDEMIA   . OBESITY   . ANXIETY   . DEPRESSION   . HYPERTENSION   . ALLERGIC RHINITIS   . GERD   . HIATAL HERNIA   . DIVERTICULOSIS, COLON   . OSTEOARTHRITIS     L knee  . OSTEOPENIA   . INTERMITTENT VERTIGO   . COLONIC POLYPS, HX OF   . Esophageal stricture   . Hypersomnolence   . Post-menopausal     hormone replacement therapy   Past Surgical History  Procedure Date  . Abdominal hysterectomy   . Cesarean section   . Oophorectomy     one  . Rotator cuff repair     left  . Lysis of adhesions   . Joint replacement   . Cholecystectomy     reports that she has never smoked. She does not have any smokeless tobacco history on file. She reports that she does not drink alcohol or use illicit drugs. family history includes Atrial fibrillation in her mother; Cancer in her brother, mother, and sister; Diabetes in her other; Heart disease in her mother; and Hypertension in her mother. Allergies  Allergen Reactions  . Aspirin   . Penicillins   . Simvastatin     REACTION: itch   Current Outpatient Prescriptions on File Prior to Visit    Medication Sig Dispense Refill  . ALPRAZolam (XANAX) 0.25 MG tablet Take 1 tablet (0.25 mg total) by mouth 2 (two) times daily as needed for anxiety.  60 tablet  1  . atorvastatin (LIPITOR) 20 MG tablet Take 1 tablet (20 mg total) by mouth daily.  90 tablet  3  . estradiol (ESTRACE) 1 MG tablet TAKE ONE TABLET BY MOUTH EVERY DAY  90 tablet  1  . fluticasone (FLONASE) 50 MCG/ACT nasal spray Place 2 sprays into the nose daily.  16 g  2  . furosemide (LASIX) 40 MG tablet TAKE ONE TABLET BY MOUTH EVERY DAY  90 tablet  3  . glimepiride (AMARYL) 4 MG tablet Take 1 pill by mouth in the AM, and 1/2 pill in the PM  135 tablet  3  . glucose blood (ONE TOUCH ULTRA TEST) test strip Use as directed 1 per day  100 each  5  . Lancets (ONETOUCH ULTRASOFT) lancets Use as directed 1 per day  100 each  5  . lisinopril (PRINIVIL,ZESTRIL) 20 MG tablet TAKE ONE TABLET BY MOUTH EVERY DAY  90 tablet  2  . meclizine (ANTIVERT) 25 MG tablet 1 by mouth every 6 hours as needed for dizziness       . metFORMIN (GLUCOPHAGE) 500 MG tablet  TAKE TWO TABLETS BY MOUTH TWICE DAILY  120 tablet  11  . omeprazole (PRILOSEC) 20 MG capsule Take 1 capsule (20 mg total) by mouth 2 (two) times daily.  180 capsule  3  . PARoxetine (PAXIL) 20 MG tablet TAKE ONE TABLET BY MOUTH EVERY DAY  90 tablet  0  . promethazine (PHENERGAN) 25 MG tablet TAKE ONE TABLET BY MOUTH EVERY 6 HOURS AS NEEDED FOR NAUSEA  40 tablet  1  . traMADol (ULTRAM) 50 MG tablet Take 50 mg by mouth every 6 (six) hours as needed. For pain        Review of Systems Review of Systems  Constitutional: Negative for diaphoresis and unexpected weight change.  HENT: Negative for drooling and tinnitus.   Eyes: Negative for photophobia and visual disturbance.  Respiratory: Negative for choking and stridor.   Gastrointestinal: Negative for vomiting and blood in stool.  Genitourinary: Negative for hematuria and decreased urine volume.  Musculoskeletal: Negative for gait problem.   Neurological: Negative for tremors and numbness.    Objective:   Physical Exam BP 132/68  Pulse 119  Temp 102.2 F (39 C) (Oral)  Ht 5\' 1"  (1.549 m)  Wt 175 lb 2 oz (79.436 kg)  BMI 33.09 kg/m2  SpO2 95% Physical Exam  VS noted,mild ill Constitutional: Pt appears well-developed and well-nourished.  HENT: Head: Normocephalic.  Right Ear: External ear normal.  Left Ear: External ear normal.  Bilat tm's mild erythema.  Sinus nontender.  Pharynx mild erythema Eyes: Conjunctivae and EOM are normal. Pupils are equal, round, and reactive to light.  Neck: Normal range of motion. Neck supple.  Cardiovascular: Normal rate and regular rhythm.   Pulmonary/Chest: Effort normal and breath sounds mild decreased with bilat mild wheezes Neurological: Pt is alert. Not confused Skin: Skin is warm. No erythema.  Psychiatric: Pt behavior is normal. Thought content normal. 1+ nervous    Assessment & Plan:

## 2012-02-15 ENCOUNTER — Telehealth: Payer: Self-pay | Admitting: *Deleted

## 2012-02-15 NOTE — Telephone Encounter (Signed)
Pt received letter regarding results of chest xray and saw that a repeat chest xray was advised by Dr. Jenny Reichmann in 1 week. Pt wants to know if she needs to come in for appt or if she can come in just to have xray done.

## 2012-02-15 NOTE — Telephone Encounter (Signed)
If ok with pt, ok for cxr today, then f/u OV tomorrow

## 2012-02-15 NOTE — Telephone Encounter (Signed)
Patient informed and will come in tomorrow for her xray and OV.

## 2012-02-16 ENCOUNTER — Ambulatory Visit (INDEPENDENT_AMBULATORY_CARE_PROVIDER_SITE_OTHER): Payer: Medicare Other | Admitting: Internal Medicine

## 2012-02-16 ENCOUNTER — Ambulatory Visit (INDEPENDENT_AMBULATORY_CARE_PROVIDER_SITE_OTHER)
Admission: RE | Admit: 2012-02-16 | Discharge: 2012-02-16 | Disposition: A | Discharge status: Home / Self Care | Payer: Medicare Other | Source: Ambulatory Visit | Attending: Internal Medicine | Admitting: Internal Medicine

## 2012-02-16 VITALS — BP 122/82 | HR 94 | Temp 97.0°F | Ht 62.0 in | Wt 172.0 lb

## 2012-02-16 DIAGNOSIS — F411 Generalized anxiety disorder: Secondary | ICD-10-CM

## 2012-02-16 DIAGNOSIS — I10 Essential (primary) hypertension: Secondary | ICD-10-CM

## 2012-02-16 DIAGNOSIS — E119 Type 2 diabetes mellitus without complications: Secondary | ICD-10-CM

## 2012-02-16 DIAGNOSIS — J189 Pneumonia, unspecified organism: Secondary | ICD-10-CM

## 2012-02-16 NOTE — Patient Instructions (Addendum)
Continue all other medications as before No other changes needed at this time You are given the copy of your chest xray from today

## 2012-02-21 ENCOUNTER — Encounter: Payer: Self-pay | Admitting: Internal Medicine

## 2012-02-21 NOTE — Assessment & Plan Note (Signed)
stable overall by hx and exam, most recent data reviewed with pt, and pt to continue medical treatment as before Lab Results  Component Value Date   HGBA1C 7.3* 11/19/2011

## 2012-02-21 NOTE — Assessment & Plan Note (Signed)
stable overall by hx and exam, most recent data reviewed with pt, and pt to continue medical treatment as before BP Readings from Last 3 Encounters:  02/16/12 122/82  02/09/12 132/68  11/19/11 128/80

## 2012-02-21 NOTE — Progress Notes (Signed)
Subjective:    Patient ID: Cindy Larson, female    DOB: 1945-09-22, 66 y.o.   MRN: [id number]  HPI  Here to f/u recent pna, overall doing much better with only small nonprod cough;   Pt denies fever, wt loss, night sweats, loss of appetite, or other constitutional symptoms, and no ST, and Pt denies chest pain, increased sob or doe, wheezing, orthopnea, PND, increased LE swelling, palpitations, dizziness or syncope.Pt denies new neurological symptoms such as new headache, or facial or extremity weakness or numbness   Pt denies polydipsia, polyuria.  No other new complaints  Denies worsening depressive symptoms, suicidal ideation, or panic, though has ongoing anxiety, not increased recently.  Past Medical History  Diagnosis Date  . DIABETES MELLITUS, TYPE II   . HYPERLIPIDEMIA   . OBESITY   . ANXIETY   . DEPRESSION   . HYPERTENSION   . ALLERGIC RHINITIS   . GERD   . HIATAL HERNIA   . DIVERTICULOSIS, COLON   . OSTEOARTHRITIS     L knee  . OSTEOPENIA   . INTERMITTENT VERTIGO   . COLONIC POLYPS, HX OF   . Esophageal stricture   . Hypersomnolence   . Post-menopausal     hormone replacement therapy   Past Surgical History  Procedure Date  . Abdominal hysterectomy   . Cesarean section   . Oophorectomy     one  . Rotator cuff repair     left  . Lysis of adhesions   . Joint replacement   . Cholecystectomy     reports that she has never smoked. She does not have any smokeless tobacco history on file. She reports that she does not drink alcohol or use illicit drugs. family history includes Atrial fibrillation in her mother; Cancer in her brother, mother, and sister; Diabetes in her other; Heart disease in her mother; and Hypertension in her mother. Allergies  Allergen Reactions  . Aspirin   . Penicillins   . Simvastatin     REACTION: itch   Current Outpatient Prescriptions on File Prior to Visit  Medication Sig Dispense Refill  . ALPRAZolam (XANAX) 0.25 MG tablet Take 1  tablet (0.25 mg total) by mouth 2 (two) times daily as needed for anxiety.  60 tablet  1  . atorvastatin (LIPITOR) 20 MG tablet Take 1 tablet (20 mg total) by mouth daily.  90 tablet  3  . estradiol (ESTRACE) 1 MG tablet TAKE ONE TABLET BY MOUTH EVERY DAY  90 tablet  1  . fluticasone (FLONASE) 50 MCG/ACT nasal spray Place 2 sprays into the nose daily.  16 g  2  . furosemide (LASIX) 40 MG tablet TAKE ONE TABLET BY MOUTH EVERY DAY  90 tablet  3  . glimepiride (AMARYL) 4 MG tablet Take 1 pill by mouth in the AM, and 1/2 pill in the PM  135 tablet  3  . glucose blood (ONE TOUCH ULTRA TEST) test strip Use as directed 1 per day  100 each  5  . Lancets (ONETOUCH ULTRASOFT) lancets Use as directed 1 per day  100 each  5  . lisinopril (PRINIVIL,ZESTRIL) 20 MG tablet TAKE ONE TABLET BY MOUTH EVERY DAY  90 tablet  2  . meclizine (ANTIVERT) 25 MG tablet 1 by mouth every 6 hours as needed for dizziness       . metFORMIN (GLUCOPHAGE) 500 MG tablet TAKE TWO TABLETS BY MOUTH TWICE DAILY  120 tablet  11  . omeprazole (PRILOSEC) 20 MG  capsule Take 1 capsule (20 mg total) by mouth 2 (two) times daily.  180 capsule  3  . PARoxetine (PAXIL) 20 MG tablet TAKE ONE TABLET BY MOUTH EVERY DAY  90 tablet  0  . predniSONE (DELTASONE) 10 MG tablet 3 tabs by mouth per day for 3 days,2tabs per day for 3 days,1tab per day for 3 days  18 tablet  0  . promethazine (PHENERGAN) 25 MG tablet TAKE ONE TABLET BY MOUTH EVERY 6 HOURS AS NEEDED FOR NAUSEA  40 tablet  1  . traMADol (ULTRAM) 50 MG tablet Take 50 mg by mouth every 6 (six) hours as needed. For pain        Review of Systems Review of Systems  Constitutional: Negative for diaphoresis and unexpected weight change.  HENT: Negative for drooling and tinnitus.   Eyes: Negative for photophobia and visual disturbance.  Respiratory: Negative for choking and stridor.   Gastrointestinal: Negative for vomiting and blood in stool.  Genitourinary: Negative for hematuria and decreased  urine volume.  Musculoskeletal: Negative for gait problem.  Skin: Negative for color change and wound.      Objective:   Physical Exam BP 122/82  Pulse 94  Temp 97 F (36.1 C) (Oral)  Ht 5\' 2"  (1.575 m)  Wt 172 lb (78.019 kg)  BMI 31.46 kg/m2  SpO2 96% Physical Exam  VS noted, mild fatigued appearing Constitutional: Pt appears well-developed and well-nourished.  HENT: Head: Normocephalic.  Right Ear: External ear normal.  Left Ear: External ear normal.  Bilat tm's mild erythema.  Sinus nontender.  Pharynx mild erythema Eyes: Conjunctivae and EOM are normal. Pupils are equal, round, and reactive to light.  Neck: Normal range of motion. Neck supple.  Cardiovascular: Normal rate and regular rhythm.   Pulmonary/Chest: Effort normal and breath sounds normal.  Neurological: Pt is alert. Not confused Skin: Skin is warm. No erythema.  Psychiatric: Pt behavior is normal. Thought content normal. 1+ nervous    Assessment & Plan:

## 2012-02-21 NOTE — Assessment & Plan Note (Signed)
stable overall by hx and exam, most recent data reviewed with pt, and pt to continue medical treatment as before Lab Results  Component Value Date   WBC 6.8 11/19/2011   HGB 12.7 11/19/2011   HCT 38.5 11/19/2011   PLT 292.0 11/19/2011   GLUCOSE 145* 11/19/2011   CHOL 131 11/19/2011   TRIG 241.0* 11/19/2011   HDL 48.30 11/19/2011   LDLDIRECT 62.1 11/19/2011   LDLCALC 108* 05/21/2011   ALT 19 11/19/2011   AST 20 11/19/2011   NA 136 11/19/2011   K 4.2 11/19/2011   CL 100 11/19/2011   CREATININE 1.1 11/19/2011   BUN 22 11/19/2011   CO2 27 11/19/2011   TSH 3.73 11/19/2011   HGBA1C 7.3* 11/19/2011   MICROALBUR 1.3 11/19/2011

## 2012-02-21 NOTE — Assessment & Plan Note (Signed)
Improved, to finish antibx, f/u cxr reviewed with pt,  to f/u any worsening symptoms or concerns

## 2012-02-22 ENCOUNTER — Emergency Department (HOSPITAL_COMMUNITY)
Admission: EM | Admit: 2012-02-22 | Discharge: 2012-02-22 | Disposition: A | Discharge status: Home / Self Care | Payer: Medicare Other | Attending: Emergency Medicine | Admitting: Emergency Medicine

## 2012-02-22 ENCOUNTER — Emergency Department (HOSPITAL_COMMUNITY): Payer: Medicare Other

## 2012-02-22 DIAGNOSIS — E119 Type 2 diabetes mellitus without complications: Secondary | ICD-10-CM | POA: Insufficient documentation

## 2012-02-22 DIAGNOSIS — E785 Hyperlipidemia, unspecified: Secondary | ICD-10-CM | POA: Insufficient documentation

## 2012-02-22 DIAGNOSIS — F341 Dysthymic disorder: Secondary | ICD-10-CM | POA: Insufficient documentation

## 2012-02-22 DIAGNOSIS — R51 Headache: Secondary | ICD-10-CM | POA: Insufficient documentation

## 2012-02-22 DIAGNOSIS — M542 Cervicalgia: Secondary | ICD-10-CM | POA: Insufficient documentation

## 2012-02-22 DIAGNOSIS — R079 Chest pain, unspecified: Secondary | ICD-10-CM | POA: Insufficient documentation

## 2012-02-22 DIAGNOSIS — Z79899 Other long term (current) drug therapy: Secondary | ICD-10-CM | POA: Insufficient documentation

## 2012-02-22 DIAGNOSIS — Y92410 Unspecified street and highway as the place of occurrence of the external cause: Secondary | ICD-10-CM | POA: Insufficient documentation

## 2012-02-22 MED ORDER — CYCLOBENZAPRINE HCL 10 MG PO TABS
10.0000 mg | ORAL_TABLET | Freq: Once | ORAL | Status: AC
  Administered 2012-02-22: 10 mg via ORAL
  Filled 2012-02-22: qty 1

## 2012-02-22 MED ORDER — HYDROCODONE-ACETAMINOPHEN 5-500 MG PO TABS: 1.0000 | ORAL_TABLET | Freq: Four times a day (QID) | ORAL | Status: AC | PRN

## 2012-02-22 MED ORDER — HYDROCODONE-ACETAMINOPHEN 5-325 MG PO TABS
1.0000 | ORAL_TABLET | Freq: Once | ORAL | Status: AC
  Administered 2012-02-22: 1 via ORAL
  Filled 2012-02-22: qty 1
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
## 2012-02-23 NOTE — ED Provider Notes (Signed)
Medical screening examination/treatment/procedure(s) were conducted as a shared visit with non-physician practitioner(s) and myself.  I personally evaluated the patient during the encounter  Orlie Dakin, MD 02/23/12 0004

## 2012-03-01 ENCOUNTER — Ambulatory Visit (INDEPENDENT_AMBULATORY_CARE_PROVIDER_SITE_OTHER): Payer: Medicare Other | Admitting: Internal Medicine

## 2012-03-01 ENCOUNTER — Encounter: Payer: Self-pay | Admitting: Internal Medicine

## 2012-03-01 VITALS — BP 120/80 | HR 102 | Temp 97.4°F | Ht 61.0 in | Wt 173.5 lb

## 2012-03-01 DIAGNOSIS — M549 Dorsalgia, unspecified: Secondary | ICD-10-CM

## 2012-03-01 DIAGNOSIS — I10 Essential (primary) hypertension: Secondary | ICD-10-CM

## 2012-03-01 DIAGNOSIS — F329 Major depressive disorder, single episode, unspecified: Secondary | ICD-10-CM

## 2012-03-01 MED ORDER — TRAMADOL HCL 50 MG PO TABS: 50.0000 mg | ORAL_TABLET | Freq: Four times a day (QID) | ORAL | Status: AC | PRN
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
## 2012-03-01 NOTE — Patient Instructions (Addendum)
Take all new medications as prescribed - the tramadol for pain if needed Continue all other medications as before

## 2012-03-03 ENCOUNTER — Encounter: Payer: Self-pay | Admitting: Internal Medicine

## 2012-03-03 DIAGNOSIS — M549 Dorsalgia, unspecified: Secondary | ICD-10-CM | POA: Insufficient documentation

## 2012-03-03 NOTE — Assessment & Plan Note (Signed)
stable overall by hx and exam, most recent data reviewed with pt, and pt to continue medical treatment as before Lab Results  Component Value Date   WBC 6.8 11/19/2011   HGB 12.7 11/19/2011   HCT 38.5 11/19/2011   PLT 292.0 11/19/2011   GLUCOSE 145* 11/19/2011   CHOL 131 11/19/2011   TRIG 241.0* 11/19/2011   HDL 48.30 11/19/2011   LDLDIRECT 62.1 11/19/2011   LDLCALC 108* 05/21/2011   ALT 19 11/19/2011   AST 20 11/19/2011   NA 136 11/19/2011   K 4.2 11/19/2011   CL 100 11/19/2011   CREATININE 1.1 11/19/2011   BUN 22 11/19/2011   CO2 27 11/19/2011   TSH 3.73 11/19/2011   HGBA1C 7.3* 11/19/2011   MICROALBUR 1.3 11/19/2011

## 2012-03-03 NOTE — Assessment & Plan Note (Signed)
stable overall by hx and exam, most recent data reviewed with pt, and pt to continue medical treatment as before BP Readings from Last 3 Encounters:  03/01/12 120/80  02/22/12 116/65  02/16/12 122/82
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
## 2012-05-04 ENCOUNTER — Other Ambulatory Visit: Payer: Self-pay | Admitting: Internal Medicine

## 2012-05-20 ENCOUNTER — Ambulatory Visit: Payer: Medicare Other | Admitting: Internal Medicine

## 2012-05-24 ENCOUNTER — Other Ambulatory Visit: Payer: Self-pay | Admitting: Internal Medicine

## 2012-06-26 ENCOUNTER — Other Ambulatory Visit: Payer: Self-pay | Admitting: Internal Medicine

## 2012-06-29 ENCOUNTER — Ambulatory Visit (INDEPENDENT_AMBULATORY_CARE_PROVIDER_SITE_OTHER): Payer: Medicare Other | Admitting: General Practice

## 2012-06-29 DIAGNOSIS — Z23 Encounter for immunization: Secondary | ICD-10-CM

## 2012-07-01 ENCOUNTER — Other Ambulatory Visit: Payer: Self-pay | Admitting: Internal Medicine

## 2012-07-11 ENCOUNTER — Other Ambulatory Visit: Payer: Self-pay | Admitting: Internal Medicine

## 2012-07-20 ENCOUNTER — Other Ambulatory Visit: Payer: Self-pay | Admitting: Internal Medicine

## 2012-07-20 DIAGNOSIS — Z1231 Encounter for screening mammogram for malignant neoplasm of breast: Secondary | ICD-10-CM

## 2012-08-09 ENCOUNTER — Ambulatory Visit (HOSPITAL_COMMUNITY)
Admission: RE | Admit: 2012-08-09 | Discharge: 2012-08-09 | Disposition: A | Discharge status: Home / Self Care | Payer: Medicare Other | Source: Ambulatory Visit | Attending: Internal Medicine | Admitting: Internal Medicine

## 2012-08-09 DIAGNOSIS — Z1231 Encounter for screening mammogram for malignant neoplasm of breast: Secondary | ICD-10-CM | POA: Insufficient documentation

## 2012-10-10 ENCOUNTER — Telehealth: Payer: Self-pay | Admitting: Internal Medicine

## 2012-10-10 NOTE — Telephone Encounter (Signed)
Pt states that she has been having LLQ abdominal pain for the past 2 days. States it was severe yesterday and that it hurts very bad when she has a bowel movement. Offered pt an appt for this afternoon but pt requests appt tomorrow. Pt scheduled to see Alonza Bogus PA tomorrow at 1:30pm. Pt aware of appt date and time.

## 2012-10-11 ENCOUNTER — Ambulatory Visit (INDEPENDENT_AMBULATORY_CARE_PROVIDER_SITE_OTHER)
Admission: RE | Admit: 2012-10-11 | Discharge: 2012-10-11 | Disposition: A | Discharge status: Home / Self Care | Payer: Medicare Other | Source: Ambulatory Visit | Attending: Gastroenterology | Admitting: Gastroenterology

## 2012-10-11 ENCOUNTER — Other Ambulatory Visit (INDEPENDENT_AMBULATORY_CARE_PROVIDER_SITE_OTHER): Payer: Medicare Other

## 2012-10-11 ENCOUNTER — Encounter: Payer: Self-pay | Admitting: Gastroenterology

## 2012-10-11 ENCOUNTER — Ambulatory Visit (INDEPENDENT_AMBULATORY_CARE_PROVIDER_SITE_OTHER): Payer: Medicare Other | Admitting: Gastroenterology

## 2012-10-11 VITALS — BP 132/80 | HR 103 | Ht 59.0 in | Wt 180.2 lb

## 2012-10-11 DIAGNOSIS — R1032 Left lower quadrant pain: Secondary | ICD-10-CM

## 2012-10-11 LAB — BASIC METABOLIC PANEL
CO2: 27 mEq/L (ref 19–32)
Calcium: 9.3 mg/dL (ref 8.4–10.5)
Creatinine, Ser: 1.1 mg/dL (ref 0.4–1.2)
GFR: 66.62 mL/min (ref >60.00)

## 2012-10-11 MED ORDER — CIPROFLOXACIN HCL 500 MG PO TABS: 500.0000 mg | ORAL_TABLET | Freq: Two times a day (BID) | ORAL | Status: DC

## 2012-10-11 MED ORDER — IOHEXOL 300 MG/ML  SOLN
80.0000 mL | Freq: Once | INTRAMUSCULAR | Status: AC | PRN
  Administered 2012-10-11: 80 mL via INTRAVENOUS

## 2012-10-11 MED ORDER — METRONIDAZOLE 500 MG PO TABS: 500.0000 mg | ORAL_TABLET | Freq: Three times a day (TID) | ORAL | Status: DC

## 2012-10-11 NOTE — Progress Notes (Signed)
Agree with initial assessment and plans. Physician assistant to followup on imaging

## 2012-10-11 NOTE — Progress Notes (Signed)
10/11/2012 SKYYE GABA [id number] 07/16/1946   History of Present Illness: Pleasant 67 year old female who is a patient of Dr. Blanch Media.  She presents to our office today with complaints of LLQ abdominal pain that began approximately one week ago and has been worsening over the last couple of days.  She has experienced some nausea, but no vomiting.  No fever, but couple of episodes of chills.  She took one of her husband's pain medications this morning, which has helped some, but still quite painful.  Says that BM's are normal without blood.  Says that when she is about to have a BM the pain worsens, but improves slightly post-BM.  Never had pain similar to this previously.  Last colonoscopy 05/2010 showed a couple of tubular adenomas and mild diverticulosis.    Current Medications, Allergies, Past Medical History, Past Surgical History, Family History and Social History were reviewed in [employer] record.   Physical Exam: BP 132/80  Pulse 103  Ht 4\' 11"  (1.499 m)  Wt 180 lb 3.2 oz (81.738 kg)  BMI 36.38 kg/m2 General: Well developed, black female in no acute distress; non-toxic appearing Head: Normocephalic and atraumatic Eyes:  sclerae anicteric, conjunctiva pink  Ears: Normal auditory acuity Lungs: Clear throughout to auscultation Heart: Slightly tachy but regular rhythm Abdomen: Soft, non-distended. No masses, no hepatomegaly. Normal bowel sounds.  LLQ TTP with some voluntary guarding. Rectal: Deferred. Musculoskeletal: Symmetrical with no gross deformities  Extremities: No edema  Neurological: Alert oriented x 4, grossly nonfocal Psychological:  Alert and cooperative. Normal mood and affect  Assessment and Recommendations: -LLQ abdominal pain:  High suspicion for diverticulitis.  Will order CT scan abdomen and pelvis with contrast.  BMP ordered.  Will empirically begin treatment with flagyl 500 mg TID x 7 days and cipro 500 mg BID x 7 days.  Should take  only clear liquids for the next day or two to allow bowel rest.

## 2012-10-11 NOTE — Patient Instructions (Addendum)
Your physician has requested that you go to the basement for the following lab work before leaving today:  BMET  We have sent the following medications to your pharmacy for you to pick up at your convenience:  Flagyl, Cipro  You have been scheduled for a CT scan of the abdomen and pelvis at [facility] ([address]---this is in the same building as Press [facility]).   You are scheduled on 10-11-12 at 4:00pm. You should arrive 15 minutes prior to your appointment time for registration. Please follow the written instructions below on the day of your exam:  WARNING: IF YOU ARE ALLERGIC TO IODINE/X-RAY DYE, PLEASE NOTIFY RADIOLOGY IMMEDIATELY AT [phone]! YOU WILL BE GIVEN A 13 HOUR PREMEDICATION PREP.  1) Do not eat or drink anything starting now (4 hours prior to your test) 2) You have been given 2 bottles of oral contrast to drink. The solution may taste better if refrigerated, but do NOT add ice or any other liquid to this solution. Shake well before drinking.    Drink 1 bottle of contrast @ 2:15 (2 hours prior to your exam)  Drink 1 bottle of contrast @ 3:15pm (1 hour prior to your exam)  You may take any medications as prescribed with a small amount of water except for the following: Metformin, Glucophage, Glucovance, Avandamet, Riomet, Fortamet, Actoplus Met, Janumet, Glumetza or Metaglip. The above medications must be held the day of the exam AND 48 hours after the exam.  The purpose of you drinking the oral contrast is to aid in the visualization of your intestinal tract. The contrast solution may cause some diarrhea. Before your exam is started, you will be given a small amount of fluid to drink. Depending on your individual set of symptoms, you may also receive an intravenous injection of x-ray contrast/dye. Plan on being at [facility] for 30 minutes or long, depending on the type of exam you are having performed.  If you have any questions regarding your  exam or if you need to reschedule, you may call the CT department at [phone] between the hours of 8:00 am and 5:00 pm, Monday-Friday.  You may drink some water between now and your test.  Janett Billow also suggests you drink clear liquids for the next day or two.  ________________________________________________________________________

## 2012-10-12 ENCOUNTER — Telehealth: Payer: Self-pay | Admitting: Gastroenterology

## 2012-10-12 NOTE — Telephone Encounter (Signed)
Spoke with patient about CT scan results.  Shows mild sigmoid diverticulitis without complications.  She began antibiotics last evening.  Will continue those as discussed at visit.  Clear liquid diet today and maybe tomorrow.  ER is symptoms worsen.

## 2012-10-24 ENCOUNTER — Telehealth: Payer: Self-pay

## 2012-10-24 MED ORDER — GLIMEPIRIDE 4 MG PO TABS: 2.0000 mg | ORAL_TABLET | ORAL | Status: DC

## 2012-10-24 NOTE — Telephone Encounter (Signed)
refill 

## 2012-10-28 ENCOUNTER — Ambulatory Visit: Payer: Medicare Other | Admitting: Gastroenterology

## 2012-11-18 ENCOUNTER — Other Ambulatory Visit: Payer: Self-pay | Admitting: Internal Medicine

## 2012-11-21 ENCOUNTER — Other Ambulatory Visit: Payer: Self-pay | Admitting: Internal Medicine

## 2012-12-27 ENCOUNTER — Ambulatory Visit (INDEPENDENT_AMBULATORY_CARE_PROVIDER_SITE_OTHER): Payer: Medicare Other | Admitting: Internal Medicine

## 2012-12-27 ENCOUNTER — Encounter: Payer: Self-pay | Admitting: Internal Medicine

## 2012-12-27 VITALS — BP 130/82 | HR 95 | Temp 97.5°F | Ht 61.0 in | Wt 177.0 lb

## 2012-12-27 DIAGNOSIS — J019 Acute sinusitis, unspecified: Secondary | ICD-10-CM

## 2012-12-27 DIAGNOSIS — I10 Essential (primary) hypertension: Secondary | ICD-10-CM

## 2012-12-27 DIAGNOSIS — E119 Type 2 diabetes mellitus without complications: Secondary | ICD-10-CM

## 2012-12-27 MED ORDER — LEVOFLOXACIN 250 MG PO TABS: 250.0000 mg | ORAL_TABLET | Freq: Every day | ORAL | Status: DC

## 2012-12-27 MED ORDER — HYDROCODONE-HOMATROPINE 5-1.5 MG/5ML PO SYRP: 5.0000 mL | ORAL_SOLUTION | Freq: Four times a day (QID) | ORAL | Status: DC | PRN

## 2012-12-27 NOTE — Assessment & Plan Note (Signed)
Mild to mod, for antibx course,  to f/u any worsening symptoms or concerns 

## 2012-12-27 NOTE — Progress Notes (Signed)
  Subjective:    Patient ID: Cindy Larson, female    DOB: 1946-06-26, 67 y.o.   MRN: [id number]  HPI   Here with 2-3 days acute onset fever, facial pain, pressure, headache, general weakness and malaise, and greenish d/c, with mild ST and cough, but pt denies chest pain, wheezing, increased sob or doe, orthopnea, PND, increased LE swelling, palpitations, dizziness or syncope.  Pt denies polydipsia, polyuria.  Pt denies new neurological symptoms such as new headache, or facial or extremity weakness or numbness.   Past Medical History  Diagnosis Date  . DIABETES MELLITUS, TYPE II   . HYPERLIPIDEMIA   . OBESITY   . ANXIETY   . DEPRESSION   . HYPERTENSION   . ALLERGIC RHINITIS   . GERD   . HIATAL HERNIA   . DIVERTICULOSIS, COLON   . OSTEOARTHRITIS     L knee  . OSTEOPENIA   . INTERMITTENT VERTIGO   . COLONIC POLYPS, HX OF   . Esophageal stricture   . Hypersomnolence   . Post-menopausal     hormone replacement therapy   Past Surgical History  Procedure Laterality Date  . Abdominal hysterectomy    . Cesarean section    . Oophorectomy      one  . Rotator cuff repair      left  . Lysis of adhesions    . Joint replacement    . Cholecystectomy      reports that she has never smoked. She has never used smokeless tobacco. She reports that she does not drink alcohol or use illicit drugs. family history includes Atrial fibrillation in her mother; Cancer in her brother, mother, and paternal aunt; Diabetes in her other; Heart disease in her mother; and Hypertension in her mother. Allergies  Allergen Reactions  . Aspirin Other (See Comments)    Stomach hurts  . Penicillins Hives    blisters  . Simvastatin     REACTION: itch   Review of Systems  Constitutional: Negative for unexpected weight change, or unusual diaphoresis  HENT: Negative for tinnitus.   Eyes: Negative for photophobia and visual disturbance.  Respiratory: Negative for choking and stridor.   Gastrointestinal:  Negative for vomiting and blood in stool.  Genitourinary: Negative for hematuria and decreased urine volume.  Musculoskeletal: Negative for acute joint swelling Skin: Negative for color change and wound.  Neurological: Negative for tremors and numbness other than noted  Psychiatric/Behavioral: Negative for decreased concentration or  hyperactivity.       Objective:   Physical Exam BP 130/82  Pulse 95  Temp(Src) 97.5 F (36.4 C) (Oral)  Ht 5\' 1"  (1.549 m)  Wt 177 lb (80.287 kg)  BMI 33.46 kg/m2  SpO2 98% VS noted, mild ill Constitutional: Pt appears well-developed and well-nourished.  HENT: Head: NCAT.  Right Ear: External ear normal.  Left Ear: External ear normal.  Bilat tm's with mild erythema.  Max sinus areas mod bilat max tender.  Pharynx with mild erythema, no exudate Eyes: Conjunctivae and EOM are normal. Pupils are equal, round, and reactive to light.  Neck: Normal range of motion. Neck supple.  Cardiovascular: Normal rate and regular rhythm.   Pulmonary/Chest: Effort normal and breath sounds normal.  Neurological: Pt is alert. Not confused  Skin: Skin is warm. No erythema.  Psychiatric: Pt behavior is normal. Thought content normal.     Assessment & Plan:

## 2012-12-27 NOTE — Patient Instructions (Signed)
Please take all new medication as prescribed - the antibiotic, and the cough medicine Please continue all other medications as before, and refills have been done if requested. Please have the pharmacy call with any other refills you may need.

## 2012-12-27 NOTE — Assessment & Plan Note (Signed)
stable overall by history and exam, recent data reviewed with pt, and pt to continue medical treatment as before,  to f/u any worsening symptoms or concerns BP Readings from Last 3 Encounters:  12/27/12 130/82  10/11/12 132/80  03/01/12 120/80

## 2012-12-27 NOTE — Assessment & Plan Note (Signed)
stable overall by history and exam, recent data reviewed with pt, and pt to continue medical treatment as before,  to f/u any worsening symptoms or concerns Lab Results  Component Value Date   HGBA1C 7.3* 11/19/2011

## 2012-12-28 ENCOUNTER — Ambulatory Visit: Payer: Medicare Other | Admitting: Internal Medicine

## 2013-01-14 ENCOUNTER — Other Ambulatory Visit: Payer: Self-pay | Admitting: Internal Medicine

## 2013-01-31 ENCOUNTER — Other Ambulatory Visit: Payer: Self-pay | Admitting: Internal Medicine

## 2013-02-17 ENCOUNTER — Other Ambulatory Visit: Payer: Self-pay | Admitting: Internal Medicine

## 2013-02-17 NOTE — Telephone Encounter (Signed)
Faxed hardcopy to [facility] [address].

## 2013-02-17 NOTE — Telephone Encounter (Signed)
Done hardcopy to [facility]  

## 2013-02-23 ENCOUNTER — Other Ambulatory Visit: Payer: Self-pay | Admitting: Internal Medicine

## 2013-04-12 ENCOUNTER — Other Ambulatory Visit: Payer: Self-pay | Admitting: Internal Medicine

## 2013-05-28 ENCOUNTER — Other Ambulatory Visit: Payer: Self-pay | Admitting: Internal Medicine

## 2013-05-29 NOTE — Telephone Encounter (Signed)
Refill done.  

## 2013-06-08 ENCOUNTER — Ambulatory Visit: Payer: Medicare Other | Admitting: Internal Medicine

## 2013-06-15 ENCOUNTER — Other Ambulatory Visit (INDEPENDENT_AMBULATORY_CARE_PROVIDER_SITE_OTHER): Payer: Medicare Other

## 2013-06-15 ENCOUNTER — Encounter: Payer: Self-pay | Admitting: Internal Medicine

## 2013-06-15 ENCOUNTER — Other Ambulatory Visit: Payer: Self-pay | Admitting: Internal Medicine

## 2013-06-15 ENCOUNTER — Ambulatory Visit (INDEPENDENT_AMBULATORY_CARE_PROVIDER_SITE_OTHER): Payer: Medicare Other | Admitting: Internal Medicine

## 2013-06-15 VITALS — BP 120/78 | HR 90 | Temp 98.1°F | Ht 59.0 in | Wt 178.0 lb

## 2013-06-15 DIAGNOSIS — I10 Essential (primary) hypertension: Secondary | ICD-10-CM

## 2013-06-15 DIAGNOSIS — E119 Type 2 diabetes mellitus without complications: Secondary | ICD-10-CM

## 2013-06-15 DIAGNOSIS — E785 Hyperlipidemia, unspecified: Secondary | ICD-10-CM

## 2013-06-15 DIAGNOSIS — F329 Major depressive disorder, single episode, unspecified: Secondary | ICD-10-CM

## 2013-06-15 DIAGNOSIS — M542 Cervicalgia: Secondary | ICD-10-CM

## 2013-06-15 DIAGNOSIS — Z23 Encounter for immunization: Secondary | ICD-10-CM

## 2013-06-15 LAB — CBC WITH DIFFERENTIAL/PLATELET
Eosinophils Absolute: 0.2 10*3/uL (ref 0.0–0.7)
Eosinophils Relative: 2.9 % (ref 0.0–5.0)
HCT: 38.3 % (ref 36.0–46.0)
Lymphs Abs: 3.6 10*3/uL (ref 0.7–4.0)
MCHC: 33.1 g/dL (ref 30.0–36.0)
MCV: 89.5 fl (ref 78.0–100.0)
Monocytes Absolute: 0.5 10*3/uL (ref 0.1–1.0)
Monocytes Relative: 6.9 % (ref 3.0–12.0)
Neutro Abs: 2.3 10*3/uL (ref 1.4–7.7)
Neutrophils Relative %: 35 % — ABNORMAL LOW (ref 43.0–77.0)
Platelets: 282 10*3/uL (ref 150.0–400.0)
WBC: 6.7 10*3/uL (ref 4.5–10.5)

## 2013-06-15 LAB — HEPATIC FUNCTION PANEL
ALT: 19 U/L (ref 0–35)
AST: 21 U/L (ref 0–37)
Alkaline Phosphatase: 59 U/L (ref 39–117)
Bilirubin, Direct: 0.1 mg/dL (ref 0.0–0.3)
Total Bilirubin: 0.8 mg/dL (ref 0.3–1.2)
Total Protein: 7.6 g/dL (ref 6.0–8.3)

## 2013-06-15 LAB — TSH: TSH: 1.19 u[IU]/mL (ref 0.35–5.50)

## 2013-06-15 LAB — URINALYSIS, ROUTINE W REFLEX MICROSCOPIC
Hgb urine dipstick: NEGATIVE
Ketones, ur: NEGATIVE
Leukocytes, UA: NEGATIVE
Nitrite: NEGATIVE
Specific Gravity, Urine: >=1.03 (ref 1.000–1.030)
pH: 5.5 (ref 5.0–8.0)

## 2013-06-15 LAB — MICROALBUMIN / CREATININE URINE RATIO: Microalb Creat Ratio: 2.1 mg/g (ref 0.0–30.0)

## 2013-06-15 LAB — LIPID PANEL
HDL: 49 mg/dL (ref >39.00)
Total CHOL/HDL Ratio: 3
VLDL: 45.4 mg/dL — ABNORMAL HIGH (ref 0.0–40.0)

## 2013-06-15 LAB — BASIC METABOLIC PANEL
BUN: 13 mg/dL (ref 6–23)
Chloride: 103 mEq/L (ref 96–112)
GFR: 69.5 mL/min (ref >60.00)
Potassium: 4.5 mEq/L (ref 3.5–5.1)
Sodium: 140 mEq/L (ref 135–145)

## 2013-06-15 LAB — HEMOGLOBIN A1C: Hgb A1c MFr Bld: 7.6 % — ABNORMAL HIGH (ref 4.6–6.5)

## 2013-06-15 LAB — LDL CHOLESTEROL, DIRECT: Direct LDL: 82.3 mg/dL

## 2013-06-15 MED ORDER — CYCLOBENZAPRINE HCL 5 MG PO TABS: 5.0000 mg | ORAL_TABLET | Freq: Three times a day (TID) | ORAL | Status: DC | PRN

## 2013-06-15 MED ORDER — GLIMEPIRIDE 4 MG PO TABS: ORAL_TABLET | ORAL | Status: DC

## 2013-06-15 NOTE — Progress Notes (Signed)
Subjective:    Patient ID: Cindy Larson, female    DOB: 28-Feb-1946, 67 y.o.   MRN: [id number]  HPI   Here to f/u; overall doing ok,  Pt denies chest pain, increased sob or doe, wheezing, orthopnea, PND, increased LE swelling, palpitations, dizziness or syncope.  Pt denies polydipsia, polyuria, or low sugar symptoms such as weakness or confusion improved with po intake.  Pt denies new neurological symptoms such as new headache, or facial or extremity weakness or numbness.   Pt states overall good compliance with meds, has been trying to follow lower cholesterol, diabetic diet, with wt overall stable,  but little exercise however. Also with recent right medial ankle and foot pain last wk now resolved.  Also with sharp pain to left post lat neck, worse to turn head to left, no radiation or UE symptoms Past Medical History  Diagnosis Date  . DIABETES MELLITUS, TYPE II   . HYPERLIPIDEMIA   . OBESITY   . ANXIETY   . DEPRESSION   . HYPERTENSION   . ALLERGIC RHINITIS   . GERD   . HIATAL HERNIA   . DIVERTICULOSIS, COLON   . OSTEOARTHRITIS     L knee  . OSTEOPENIA   . INTERMITTENT VERTIGO   . COLONIC POLYPS, HX OF   . Esophageal stricture   . Hypersomnolence   . Post-menopausal     hormone replacement therapy   Past Surgical History  Procedure Laterality Date  . Abdominal hysterectomy    . Cesarean section    . Oophorectomy      one  . Rotator cuff repair      left  . Lysis of adhesions    . Joint replacement    . Cholecystectomy      reports that she has never smoked. She has never used smokeless tobacco. She reports that she does not drink alcohol or use illicit drugs. family history includes Atrial fibrillation in her mother; Cancer in her brother, mother, and paternal aunt; Diabetes in her other; Heart disease in her mother; Hypertension in her mother. Allergies  Allergen Reactions  . Aspirin Other (See Comments)    Stomach hurts  . Penicillins Hives    blisters  .  Simvastatin     REACTION: itch   Current Outpatient Prescriptions on File Prior to Visit  Medication Sig Dispense Refill  . ALPRAZolam (XANAX) 0.25 MG tablet TAKE ONE TABLET BY MOUTH TWICE DAILY AS NEEDED FOR ANXIETY  60 tablet  2  . atorvastatin (LIPITOR) 20 MG tablet TAKE ONE TABLET BY MOUTH EVERY DAY  90 tablet  1  . estradiol (ESTRACE) 1 MG tablet TAKE ONE TABLET BY MOUTH EVERY DAY  90 tablet  2  . furosemide (LASIX) 40 MG tablet TAKE ONE TABLET BY MOUTH EVERY DAY  90 tablet  2  . lisinopril (PRINIVIL,ZESTRIL) 20 MG tablet TAKE ONE TABLET BY MOUTH EVERY DAY  90 tablet  1  . meclizine (ANTIVERT) 25 MG tablet TAKE ONE TABLET BY MOUTH EVERY 6 HOURS AS NEEDED FOR  DIZZINESS  30 tablet  0  . metFORMIN (GLUCOPHAGE) 500 MG tablet TAKE TWO TABLETS BY MOUTH TWICE DAILY  120 tablet  8  . metroNIDAZOLE (FLAGYL) 500 MG tablet Take 1 tablet (500 mg total) by mouth 3 (three) times daily.  21 tablet  0  . omeprazole (PRILOSEC) 20 MG capsule TAKE ONE CAPSULE BY MOUTH TWICE DAILY  180 capsule  0  . omeprazole (PRILOSEC) 20 MG capsule TAKE ONE  CAPSULE BY MOUTH TWICE DAILY  180 capsule  3  . ONE TOUCH ULTRA TEST test strip USE AS DIRECTED ONE EVERY DAY  100 each  0  . ONETOUCH DELICA LANCETS 99991111 MISC USE AS DIRECTED ONE EVERY DAY  100 each  0  . PARoxetine (PAXIL) 20 MG tablet TAKE ONE TABLET BY MOUTH EVERY DAY  90 tablet  3  . promethazine (PHENERGAN) 25 MG tablet Take 25 mg by mouth every 6 (six) hours as needed. nausea      . promethazine (PHENERGAN) 25 MG tablet TAKE ONE TABLET BY MOUTH EVERY 6 HOURS AS NEEDED FOR NAUSEA  40 tablet  0  . fluticasone (FLONASE) 50 MCG/ACT nasal spray Place 2 sprays into the nose daily as needed. allergies       No current facility-administered medications on file prior to visit.   Review of Systems  Constitutional: Negative for unexpected weight change, or unusual diaphoresis  HENT: Negative for tinnitus.   Eyes: Negative for photophobia and visual disturbance.   Respiratory: Negative for choking and stridor.   Gastrointestinal: Negative for vomiting and blood in stool.  Genitourinary: Negative for hematuria and decreased urine volume.  Musculoskeletal: Negative for acute joint swelling Skin: Negative for color change and wound.  Neurological: Negative for tremors and numbness other than noted  Psychiatric/Behavioral: Negative for decreased concentration or  hyperactivity.       Objective:   Physical Exam BP 120/78  Pulse 90  Temp(Src) 98.1 F (36.7 C) (Oral)  Ht 4\' 11"  (1.499 m)  Wt 178 lb (80.74 kg)  BMI 35.93 kg/m2  SpO2 97% VS noted,  Constitutional: Pt appears well-developed and well-nourished.  HENT: Head: NCAT.  Right Ear: External ear normal.  Left Ear: External ear normal.  Eyes: Conjunctivae and EOM are normal. Pupils are equal, round, and reactive to light.  Neck: Normal range of motion. Neck supple. but with mild tender left post lateral Cardiovascular: Normal rate and regular rhythm.   Pulmonary/Chest: Effort normal and breath sounds normal.  Abd:  Soft, NT, non-distended, + BS Neurological: Pt is alert. Not confused  Right foot and ankle without tender, swelling, redness o/w neurovasc intact Skin: Skin is warm. No erythema.  Psychiatric: Pt behavior is normal. Thought content normal.     Assessment & Plan:

## 2013-06-15 NOTE — Assessment & Plan Note (Signed)
C/w msk strain, for flexeril prn,  to f/u any worsening symptoms or concerns 

## 2013-06-15 NOTE — Assessment & Plan Note (Signed)
stable overall by history and exam, recent data reviewed with pt, and pt to continue medical treatment as before,  to f/u any worsening symptoms or concerns Lab Results  Component Value Date   WBC 6.7 06/15/2013   HGB 12.7 06/15/2013   HCT 38.3 06/15/2013   PLT 282.0 06/15/2013   GLUCOSE 111* 06/15/2013   CHOL 159 06/15/2013   TRIG 227.0* 06/15/2013   HDL 49.00 06/15/2013   LDLDIRECT 82.3 06/15/2013   LDLCALC 108* 05/21/2011   ALT 19 06/15/2013   AST 21 06/15/2013   NA 140 06/15/2013   K 4.5 06/15/2013   CL 103 06/15/2013   CREATININE 1.0 06/15/2013   BUN 13 06/15/2013   CO2 25 06/15/2013   TSH 1.19 06/15/2013   HGBA1C 7.6* 06/15/2013   MICROALBUR 6.5* 06/15/2013

## 2013-06-15 NOTE — Assessment & Plan Note (Signed)
stable overall by history and exam, recent data reviewed with pt, and pt to continue medical treatment as before,  to f/u any worsening symptoms or concerns Lab Results  Component Value Date   LDLCALC 108* 05/21/2011

## 2013-06-15 NOTE — Patient Instructions (Addendum)
Please take all new medication as prescribed - the flexeril You had the flu shot today Please return in 2 wks for Nurse Visit for the Prevnar Pneumonia shot Please continue all other medications as before, and refills have been done if requested. Please have the pharmacy call with any other refills you may need. Please continue your efforts at being more active, low cholesterol diet, and weight control. You are otherwise up to date with prevention measures today.  Please go to the LAB in the Basement (turn left off the elevator) for the tests to be done today  You will be contacted by phone if any changes need to be made immediately.  Otherwise, you will receive a letter about your results with an explanation, but please check with MyChart first.  Please remember to sign up for My Chart if you have not done so, as this will be important to you in the future with finding out test results, communicating by private email, and scheduling acute appointments online when needed.  Please return in 6 months, or sooner if needed

## 2013-06-15 NOTE — Assessment & Plan Note (Signed)
stable overall by history and exam, recent data reviewed with pt, and pt to continue medical treatment as before,  to f/u any worsening symptoms or concerns Lab Results  Component Value Date   HGBA1C 7.6* 06/15/2013

## 2013-06-15 NOTE — Assessment & Plan Note (Signed)
stable overall by history and exam, recent data reviewed with pt, and pt to continue medical treatment as before,  to f/u any worsening symptoms or concerns BP Readings from Last 3 Encounters:  06/15/13 120/78  12/27/12 130/82  10/11/12 132/80

## 2013-06-29 ENCOUNTER — Ambulatory Visit (INDEPENDENT_AMBULATORY_CARE_PROVIDER_SITE_OTHER): Payer: Medicare Other | Admitting: *Deleted

## 2013-06-29 DIAGNOSIS — Z23 Encounter for immunization: Secondary | ICD-10-CM

## 2013-07-03 ENCOUNTER — Other Ambulatory Visit: Payer: Self-pay | Admitting: Internal Medicine

## 2013-07-21 ENCOUNTER — Encounter (HOSPITAL_COMMUNITY): Payer: Self-pay | Admitting: Emergency Medicine

## 2013-07-21 ENCOUNTER — Other Ambulatory Visit: Payer: Self-pay | Admitting: Internal Medicine

## 2013-07-21 ENCOUNTER — Emergency Department (HOSPITAL_COMMUNITY)
Admission: EM | Admit: 2013-07-21 | Discharge: 2013-07-21 | Disposition: A | Discharge status: Home / Self Care | Payer: Medicare Other | Attending: Emergency Medicine | Admitting: Emergency Medicine

## 2013-07-21 DIAGNOSIS — E119 Type 2 diabetes mellitus without complications: Secondary | ICD-10-CM | POA: Insufficient documentation

## 2013-07-21 DIAGNOSIS — G609 Hereditary and idiopathic neuropathy, unspecified: Secondary | ICD-10-CM | POA: Insufficient documentation

## 2013-07-21 DIAGNOSIS — F411 Generalized anxiety disorder: Secondary | ICD-10-CM | POA: Insufficient documentation

## 2013-07-21 DIAGNOSIS — F3289 Other specified depressive episodes: Secondary | ICD-10-CM | POA: Insufficient documentation

## 2013-07-21 DIAGNOSIS — Z79899 Other long term (current) drug therapy: Secondary | ICD-10-CM | POA: Insufficient documentation

## 2013-07-21 DIAGNOSIS — R9431 Abnormal electrocardiogram [ECG] [EKG]: Secondary | ICD-10-CM | POA: Insufficient documentation

## 2013-07-21 DIAGNOSIS — F329 Major depressive disorder, single episode, unspecified: Secondary | ICD-10-CM | POA: Insufficient documentation

## 2013-07-21 DIAGNOSIS — R000 Tachycardia, unspecified: Secondary | ICD-10-CM | POA: Insufficient documentation

## 2013-07-21 DIAGNOSIS — Z860100 Personal history of colon polyps, unspecified: Secondary | ICD-10-CM | POA: Insufficient documentation

## 2013-07-21 DIAGNOSIS — K219 Gastro-esophageal reflux disease without esophagitis: Secondary | ICD-10-CM | POA: Insufficient documentation

## 2013-07-21 DIAGNOSIS — Z8669 Personal history of other diseases of the nervous system and sense organs: Secondary | ICD-10-CM | POA: Insufficient documentation

## 2013-07-21 DIAGNOSIS — M1990 Unspecified osteoarthritis, unspecified site: Secondary | ICD-10-CM | POA: Insufficient documentation

## 2013-07-21 DIAGNOSIS — Z1231 Encounter for screening mammogram for malignant neoplasm of breast: Secondary | ICD-10-CM

## 2013-07-21 DIAGNOSIS — J309 Allergic rhinitis, unspecified: Secondary | ICD-10-CM | POA: Insufficient documentation

## 2013-07-21 DIAGNOSIS — Z8719 Personal history of other diseases of the digestive system: Secondary | ICD-10-CM | POA: Insufficient documentation

## 2013-07-21 DIAGNOSIS — E785 Hyperlipidemia, unspecified: Secondary | ICD-10-CM | POA: Insufficient documentation

## 2013-07-21 DIAGNOSIS — G629 Polyneuropathy, unspecified: Secondary | ICD-10-CM

## 2013-07-21 DIAGNOSIS — Z880 Allergy status to penicillin: Secondary | ICD-10-CM | POA: Insufficient documentation

## 2013-07-21 DIAGNOSIS — I10 Essential (primary) hypertension: Secondary | ICD-10-CM | POA: Insufficient documentation

## 2013-07-21 DIAGNOSIS — M25519 Pain in unspecified shoulder: Secondary | ICD-10-CM | POA: Insufficient documentation

## 2013-07-21 DIAGNOSIS — M899 Disorder of bone, unspecified: Secondary | ICD-10-CM | POA: Insufficient documentation

## 2013-07-21 DIAGNOSIS — Z780 Asymptomatic menopausal state: Secondary | ICD-10-CM | POA: Insufficient documentation

## 2013-07-21 DIAGNOSIS — Z888 Allergy status to other drugs, medicaments and biological substances status: Secondary | ICD-10-CM | POA: Insufficient documentation

## 2013-07-21 DIAGNOSIS — M79609 Pain in unspecified limb: Secondary | ICD-10-CM | POA: Insufficient documentation

## 2013-07-21 DIAGNOSIS — G4710 Hypersomnia, unspecified: Secondary | ICD-10-CM | POA: Insufficient documentation

## 2013-07-21 DIAGNOSIS — E669 Obesity, unspecified: Secondary | ICD-10-CM | POA: Insufficient documentation

## 2013-07-21 LAB — COMPREHENSIVE METABOLIC PANEL
ALT: 18 U/L (ref 0–35)
Albumin: 3.9 g/dL (ref 3.5–5.2)
Alkaline Phosphatase: 60 U/L (ref 39–117)
BUN: 13 mg/dL (ref 6–23)
CO2: 24 mEq/L (ref 19–32)
Calcium: 9.1 mg/dL (ref 8.4–10.5)
Creatinine, Ser: 1.03 mg/dL (ref 0.50–1.10)
GFR calc Af Amer: 64 mL/min — ABNORMAL LOW (ref >90)
GFR calc non Af Amer: 55 mL/min — ABNORMAL LOW (ref >90)
Glucose, Bld: 215 mg/dL — ABNORMAL HIGH (ref 70–99)
Potassium: 3.4 mEq/L — ABNORMAL LOW (ref 3.5–5.1)
Total Protein: 7 g/dL (ref 6.0–8.3)

## 2013-07-21 LAB — CBC WITH DIFFERENTIAL/PLATELET
Basophils Relative: 0 % (ref 0–1)
Eosinophils Absolute: 0.2 10*3/uL (ref 0.0–0.7)
Eosinophils Relative: 4 % (ref 0–5)
HCT: 37.3 % (ref 36.0–46.0)
Hemoglobin: 12.7 g/dL (ref 12.0–15.0)
Lymphocytes Relative: 55 % — ABNORMAL HIGH (ref 12–46)
Lymphs Abs: 3 10*3/uL (ref 0.7–4.0)
MCH: 30.5 pg (ref 26.0–34.0)
MCV: 89.7 fL (ref 78.0–100.0)
Monocytes Absolute: 0.2 10*3/uL (ref 0.1–1.0)
Monocytes Relative: 4 % (ref 3–12)
Neutrophils Relative %: 36 % — ABNORMAL LOW (ref 43–77)
RBC: 4.16 MIL/uL (ref 3.87–5.11)

## 2013-07-21 LAB — TROPONIN I: Troponin I: <0.3 ng/mL (ref <0.30)

## 2013-07-21 MED ORDER — GABAPENTIN 300 MG PO CAPS: 300.0000 mg | ORAL_CAPSULE | Freq: Three times a day (TID) | ORAL | Status: DC

## 2013-07-21 MED ORDER — ZOLPIDEM TARTRATE 5 MG PO TABS: 5.0000 mg | ORAL_TABLET | Freq: Every evening | ORAL | Status: DC | PRN

## 2013-07-21 NOTE — ED Provider Notes (Signed)
CSN: [id number]     Arrival date & time 07/21/13  1333 History   First MD Initiated Contact with Patient 07/21/13 1604     Chief Complaint  Patient presents with  . Nausea  . Arm Pain  . Shoulder Pain    HPI Pt c/o numbness and pain to L shoulder and L arm that radiates to L pinky x 3 weeks. Pt states there is a spot on her elbow that when she touches it, the pain is unbearable. Pt also c/o nausea x 1 week. Pt has not been lifting things with her L arm b/c of the pain.  Past Medical History  Diagnosis Date  . DIABETES MELLITUS, TYPE II   . HYPERLIPIDEMIA   . OBESITY   . ANXIETY   . DEPRESSION   . HYPERTENSION   . ALLERGIC RHINITIS   . GERD   . HIATAL HERNIA   . DIVERTICULOSIS, COLON   . OSTEOARTHRITIS     L knee  . OSTEOPENIA   . INTERMITTENT VERTIGO   . COLONIC POLYPS, HX OF   . Esophageal stricture   . Hypersomnolence   . Post-menopausal     hormone replacement therapy   Past Surgical History  Procedure Laterality Date  . Abdominal hysterectomy    . Cesarean section    . Oophorectomy      one  . Rotator cuff repair      left  . Lysis of adhesions    . Joint replacement    . Cholecystectomy     Family History  Problem Relation Age of Onset  . Hypertension Mother   . Atrial fibrillation Mother   . Heart disease Mother     CHF  . Cancer Mother     Cancer in "thigh"  . Cancer Paternal Aunt     Colon Cancer  . Cancer Brother     Lung Cancer-smoker  . Diabetes Other     Aunt and uncle   History  Substance Use Topics  . Smoking status: Never Smoker   . Smokeless tobacco: Never Used  . Alcohol Use: No   OB History   Grav Para Term Preterm Abortions TAB SAB Ect Mult Living                 Review of Systems All other systems reviewed and are negative Allergies  Aspirin; Penicillins; and Simvastatin  Home Medications   Current Outpatient Rx  Name  Route  Sig  Dispense  Refill  . ALPRAZolam (XANAX) 0.25 MG tablet   Oral   Take 0.25 mg by mouth  2 (two) times daily as needed for anxiety.         Marland Kitchen atorvastatin (LIPITOR) 20 MG tablet   Oral   Take 20 mg by mouth daily.         . Cholecalciferol (VITAMIN D) 2000 UNITS CAPS   Oral   Take 2,000 Units by mouth daily.          . cyclobenzaprine (FLEXERIL) 5 MG tablet   Oral   Take 1 tablet (5 mg total) by mouth 3 (three) times daily as needed for muscle spasms.   60 tablet   1   . estradiol (ESTRACE) 1 MG tablet   Oral   Take 1 mg by mouth daily.         . furosemide (LASIX) 40 MG tablet   Oral   Take 40 mg by mouth.         Marland Kitchen  glimepiride (AMARYL) 4 MG tablet   Oral   Take 4 mg by mouth 2 (two) times daily.         Marland Kitchen lisinopril (PRINIVIL,ZESTRIL) 20 MG tablet   Oral   Take 20 mg by mouth daily.         . meclizine (ANTIVERT) 25 MG tablet   Oral   Take 25 mg by mouth every 6 (six) hours as needed for dizziness.         . metFORMIN (GLUCOPHAGE) 500 MG tablet   Oral   Take 1,000 mg by mouth 2 (two) times daily with a meal.         . omeprazole (PRILOSEC) 20 MG capsule   Oral   Take 20 mg by mouth 2 (two) times daily before a meal.         . PARoxetine (PAXIL) 20 MG tablet   Oral   Take 20 mg by mouth daily.         Marland Kitchen gabapentin (NEURONTIN) 300 MG capsule   Oral   Take 1 capsule (300 mg total) by mouth 3 (three) times daily. Start with 1 capsule at bedtime then advanced to one capsule twice a day and if need be increased to 1 capsule 3 times a day   90 capsule   5   . zolpidem (AMBIEN) 5 MG tablet   Oral   Take 1 tablet (5 mg total) by mouth at bedtime as needed for sleep.   30 tablet   0    BP 101/70  Pulse 97  Temp(Src) 97.7 F (36.5 C) (Oral)  Resp 16  Ht 4\' 11"  (1.499 m)  Wt 177 lb 11.2 oz (80.604 kg)  BMI 35.87 kg/m2  SpO2 99% Physical Exam  Nursing note and vitals reviewed. Constitutional: She is oriented to person, place, and time. She appears well-developed and well-nourished. No distress.  HENT:  Head:  Normocephalic and atraumatic.  Eyes: Pupils are equal, round, and reactive to light.  Neck: Normal range of motion.  Cardiovascular: Normal rate and intact distal pulses.   Pulmonary/Chest: No respiratory distress.  Abdominal: Normal appearance. She exhibits no distension.  Musculoskeletal:       Left shoulder: She exhibits decreased range of motion, deformity and pain. She exhibits no bony tenderness and no swelling.  Neurological: She is alert and oriented to person, place, and time. No cranial nerve deficit.  Skin: Skin is warm and dry. No rash noted.  Psychiatric: She has a normal mood and affect. Her behavior is normal.    ED Course  Procedures (including critical care time) Labs Review Labs Reviewed  CBC WITH DIFFERENTIAL - Abnormal; Notable for the following:    Neutrophils Relative % 36 (*)    Lymphocytes Relative 55 (*)    All other components within normal limits  COMPREHENSIVE METABOLIC PANEL - Abnormal; Notable for the following:    Potassium 3.4 (*)    Glucose, Bld 215 (*)    GFR calc non Af Amer 55 (*)    GFR calc Af Amer 64 (*)    All other components within normal limits  TROPONIN I   Imaging Review No results found.  EKG Interpretation    Date/Time:  Friday July 21 2013 13:41:35 EST Ventricular Rate:  110 PR Interval:  128 QRS Duration: 68 QT Interval:  314 QTC Calculation: 424 R Axis:   60 Text Interpretation:  Sinus tachycardia T wave abnormality, consider inferior ischemia Abnormal ECG Confirmed by Federick Levene  MD, Aryav Wimberly ([id number]) on 07/21/2013 4:07:27 PM            MDM   1. Peripheral neuropathy        Dot Lanes, MD 07/29/13 [phone]

## 2013-07-21 NOTE — ED Notes (Signed)
Pt c/o numbness and pain to L shoulder and L arm that radiates to L pinky x 3 weeks.  Pt states there is a spot on her elbow that when she touches it, the pain is unbearable.  Pt also c/o nausea x 1 week.  Pt has not been lifting things with her L arm b/c of the pain.

## 2013-07-25 ENCOUNTER — Encounter: Payer: Self-pay | Admitting: Internal Medicine

## 2013-07-25 ENCOUNTER — Ambulatory Visit (INDEPENDENT_AMBULATORY_CARE_PROVIDER_SITE_OTHER): Payer: Medicare Other | Admitting: Internal Medicine

## 2013-07-25 VITALS — BP 110/72 | HR 111 | Temp 98.0°F | Wt 179.0 lb

## 2013-07-25 DIAGNOSIS — M79602 Pain in left arm: Secondary | ICD-10-CM

## 2013-07-25 DIAGNOSIS — I10 Essential (primary) hypertension: Secondary | ICD-10-CM

## 2013-07-25 DIAGNOSIS — M79609 Pain in unspecified limb: Secondary | ICD-10-CM

## 2013-07-25 DIAGNOSIS — E119 Type 2 diabetes mellitus without complications: Secondary | ICD-10-CM

## 2013-07-25 MED ORDER — GABAPENTIN 300 MG PO CAPS: 300.0000 mg | ORAL_CAPSULE | Freq: Three times a day (TID) | ORAL | Status: DC

## 2013-07-25 NOTE — Assessment & Plan Note (Signed)
stable overall by history and exam, recent data reviewed with pt, and pt to continue medical treatment as before,  to f/u any worsening symptoms or concerns BP Readings from Last 3 Encounters:  07/25/13 110/72  07/21/13 101/70  06/15/13 120/78

## 2013-07-25 NOTE — Assessment & Plan Note (Signed)
?  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

## 2013-07-25 NOTE — Progress Notes (Signed)
Subjective:    Patient ID: Cindy Larson, female    DOB: 12-23-1945, 67 y.o.   MRN: [id number]  HPI  Here with burning lancinating type pain mostly to to left ulnar area elbow and radiating distally, thought related to recent flu shot per recent provider, also has similar pain to left upper scapular area but no neck pain (though has had in the past); did also have rash just to left arm a few wks ago prior to onset pain since then, described as "welps". Pt denies chest pain, increased sob or doe, wheezing, orthopnea, PND, increased LE swelling, palpitations, dizziness or syncope.  Pt denies new neurological symptoms such as new headache, or facial or extremity weakness or numbness other than above.  Pt denies polydipsia, polyuria,   Pt states overall good compliance with meds, trying to follow lower cholesterol, diabetic diet,  Past Medical History  Diagnosis Date  . DIABETES MELLITUS, TYPE II   . HYPERLIPIDEMIA   . OBESITY   . ANXIETY   . DEPRESSION   . HYPERTENSION   . ALLERGIC RHINITIS   . GERD   . HIATAL HERNIA   . DIVERTICULOSIS, COLON   . OSTEOARTHRITIS     L knee  . OSTEOPENIA   . INTERMITTENT VERTIGO   . COLONIC POLYPS, HX OF   . Esophageal stricture   . Hypersomnolence   . Post-menopausal     hormone replacement therapy   Past Surgical History  Procedure Laterality Date  . Abdominal hysterectomy    . Cesarean section    . Oophorectomy      one  . Rotator cuff repair      left  . Lysis of adhesions    . Joint replacement    . Cholecystectomy      reports that she has never smoked. She has never used smokeless tobacco. She reports that she does not drink alcohol or use illicit drugs. family history includes Atrial fibrillation in her mother; Cancer in her brother, mother, and paternal aunt; Diabetes in her other; Heart disease in her mother; Hypertension in her mother. Allergies  Allergen Reactions  . Aspirin Other (See Comments)    Stomach hurts  .  Penicillins Hives    blisters  . Simvastatin     REACTION: itch   Current Outpatient Prescriptions on File Prior to Visit  Medication Sig Dispense Refill  . ALPRAZolam (XANAX) 0.25 MG tablet Take 0.25 mg by mouth 2 (two) times daily as needed for anxiety.      Marland Kitchen atorvastatin (LIPITOR) 20 MG tablet Take 20 mg by mouth daily.      . Cholecalciferol (VITAMIN D) 2000 UNITS CAPS Take 2,000 Units by mouth daily.       . cyclobenzaprine (FLEXERIL) 5 MG tablet Take 1 tablet (5 mg total) by mouth 3 (three) times daily as needed for muscle spasms.  60 tablet  1  . estradiol (ESTRACE) 1 MG tablet Take 1 mg by mouth daily.      . furosemide (LASIX) 40 MG tablet Take 40 mg by mouth.      Marland Kitchen glimepiride (AMARYL) 4 MG tablet Take 4 mg by mouth 2 (two) times daily.      Marland Kitchen lisinopril (PRINIVIL,ZESTRIL) 20 MG tablet Take 20 mg by mouth daily.      . meclizine (ANTIVERT) 25 MG tablet Take 25 mg by mouth every 6 (six) hours as needed for dizziness.      . metFORMIN (GLUCOPHAGE) 500 MG tablet Take 1,000  mg by mouth 2 (two) times daily with a meal.      . omeprazole (PRILOSEC) 20 MG capsule Take 20 mg by mouth 2 (two) times daily before a meal.      . PARoxetine (PAXIL) 20 MG tablet Take 20 mg by mouth daily.      Marland Kitchen zolpidem (AMBIEN) 5 MG tablet Take 1 tablet (5 mg total) by mouth at bedtime as needed for sleep.  30 tablet  0   No current facility-administered medications on file prior to visit.   Review of Systems  Constitutional: Negative for unexpected weight change, or unusual diaphoresis  HENT: Negative for tinnitus.   Eyes: Negative for photophobia and visual disturbance.  Respiratory: Negative for choking and stridor.   Gastrointestinal: Negative for vomiting and blood in stool.  Genitourinary: Negative for hematuria and decreased urine volume.  Musculoskeletal: Negative for acute joint swelling Skin: Negative for color change and wound.  Neurological: Negative for tremors and numbness other than  noted  Psychiatric/Behavioral: Negative for decreased concentration or  hyperactivity.       Objective:   Physical Exam BP 110/72  Pulse 111  Temp(Src) 98 F (36.7 C) (Oral)  Wt 179 lb (81.194 kg)  SpO2 98% VS noted,  Constitutional: Pt appears well-developed and well-nourished.  HENT: Head: NCAT.  Right Ear: External ear normal.  Left Ear: External ear normal.  Eyes: Conjunctivae and EOM are normal. Pupils are equal, round, and reactive to light.  Neck: Normal range of motion. Neck supple.  Cardiovascular: Normal rate and regular rhythm.   Pulmonary/Chest: Effort normal and breath sounds normal.  Abd:  Soft, NT, non-distended, + BS Neurological: Pt is alert. Not confused, neck/upper back/arms nontender, motor 5/5, sens/dtr intact, no rash or scarring noted  Skin: Skin is warm. No erythema.  Psychiatric: Pt behavior is normal. Thought content normal.     Assessment & Plan:

## 2013-07-25 NOTE — Progress Notes (Signed)
Pre-visit discussion using our clinic review tool. No additional management support is needed unless otherwise documented below in the visit note.  

## 2013-07-25 NOTE — Patient Instructions (Signed)
Please continue all other medications as before, and refills have been done if requested. Please have the pharmacy call with any other refills you may need. Please return in 1-2 months if not improved, as you may want to have the nerve test done for your arm Please return sooner, for any worsening pain, numbness or weakness of the arm

## 2013-07-25 NOTE — Assessment & Plan Note (Signed)
stable overall by history and exam, recent data reviewed with pt, and pt to continue medical treatment as before,  to f/u any worsening symptoms or concerns Lab Results  Component Value Date   HGBA1C 7.6* 06/15/2013

## 2013-07-31 ENCOUNTER — Other Ambulatory Visit: Payer: Self-pay | Admitting: Internal Medicine

## 2013-08-11 ENCOUNTER — Ambulatory Visit (HOSPITAL_COMMUNITY)
Admission: RE | Admit: 2013-08-11 | Discharge: 2013-08-11 | Disposition: A | Discharge status: Home / Self Care | Payer: Medicare Other | Source: Ambulatory Visit | Attending: Internal Medicine | Admitting: Internal Medicine

## 2013-08-11 DIAGNOSIS — Z1231 Encounter for screening mammogram for malignant neoplasm of breast: Secondary | ICD-10-CM

## 2013-08-15 LAB — HM MAMMOGRAPHY

## 2013-08-19 ENCOUNTER — Other Ambulatory Visit: Payer: Self-pay | Admitting: Internal Medicine

## 2013-08-22 NOTE — Telephone Encounter (Signed)
Done hardcopy to [facility]  

## 2013-08-22 NOTE — Telephone Encounter (Signed)
Faxed hardcopy to [facility]. 

## 2013-12-14 ENCOUNTER — Encounter: Payer: Self-pay | Admitting: Internal Medicine

## 2013-12-14 ENCOUNTER — Ambulatory Visit (INDEPENDENT_AMBULATORY_CARE_PROVIDER_SITE_OTHER): Payer: Medicare Other | Admitting: Internal Medicine

## 2013-12-14 ENCOUNTER — Other Ambulatory Visit (INDEPENDENT_AMBULATORY_CARE_PROVIDER_SITE_OTHER): Payer: Medicare Other

## 2013-12-14 VITALS — BP 104/72 | HR 105 | Temp 98.4°F | Ht 59.0 in | Wt 171.1 lb

## 2013-12-14 DIAGNOSIS — Z0000 Encounter for general adult medical examination without abnormal findings: Secondary | ICD-10-CM

## 2013-12-14 DIAGNOSIS — E119 Type 2 diabetes mellitus without complications: Secondary | ICD-10-CM

## 2013-12-14 LAB — BASIC METABOLIC PANEL
BUN: 15 mg/dL (ref 6–23)
CO2: 28 meq/L (ref 19–32)
Calcium: 10.2 mg/dL (ref 8.4–10.5)
Chloride: 102 mEq/L (ref 96–112)
Creatinine, Ser: 1.2 mg/dL (ref 0.4–1.2)
GFR: 59.83 mL/min — ABNORMAL LOW (ref >60.00)
Glucose, Bld: 126 mg/dL — ABNORMAL HIGH (ref 70–99)
POTASSIUM: 4.6 meq/L (ref 3.5–5.1)
SODIUM: 139 meq/L (ref 135–145)

## 2013-12-14 LAB — CBC WITH DIFFERENTIAL/PLATELET
Basophils Absolute: 0 10*3/uL (ref 0.0–0.1)
Basophils Relative: 0.4 % (ref 0.0–3.0)
Eosinophils Absolute: 0.2 10*3/uL (ref 0.0–0.7)
Eosinophils Relative: 3 % (ref 0.0–5.0)
HCT: 39 % (ref 36.0–46.0)
Hemoglobin: 13 g/dL (ref 12.0–15.0)
LYMPHS PCT: 57.1 % — AB (ref 12.0–46.0)
Lymphs Abs: 3.4 10*3/uL (ref 0.7–4.0)
MCHC: 33.3 g/dL (ref 30.0–36.0)
MCV: 90.4 fl (ref 78.0–100.0)
MONOS PCT: 5.6 % (ref 3.0–12.0)
Monocytes Absolute: 0.3 10*3/uL (ref 0.1–1.0)
NEUTROS PCT: 33.9 % — AB (ref 43.0–77.0)
Neutro Abs: 2 10*3/uL (ref 1.4–7.7)
Platelets: 335 10*3/uL (ref 150.0–400.0)
RBC: 4.32 Mil/uL (ref 3.87–5.11)
RDW: 13.6 % (ref 11.5–14.6)
WBC: 6 10*3/uL (ref 4.5–10.5)

## 2013-12-14 LAB — URINALYSIS, ROUTINE W REFLEX MICROSCOPIC
Bilirubin Urine: NEGATIVE
Hgb urine dipstick: NEGATIVE
Ketones, ur: NEGATIVE
LEUKOCYTES UA: NEGATIVE
Nitrite: NEGATIVE
SPECIFIC GRAVITY, URINE: 1.015 (ref 1.000–1.030)
Total Protein, Urine: NEGATIVE
UROBILINOGEN UA: 0.2 (ref 0.0–1.0)
Urine Glucose: NEGATIVE
pH: 5.5 (ref 5.0–8.0)

## 2013-12-14 LAB — HEPATIC FUNCTION PANEL
ALBUMIN: 4.2 g/dL (ref 3.5–5.2)
ALK PHOS: 47 U/L (ref 39–117)
ALT: 27 U/L (ref 0–35)
AST: 23 U/L (ref 0–37)
Bilirubin, Direct: 0 mg/dL (ref 0.0–0.3)
Total Bilirubin: 0.8 mg/dL (ref 0.3–1.2)
Total Protein: 7.5 g/dL (ref 6.0–8.3)

## 2013-12-14 LAB — LIPID PANEL
Cholesterol: 139 mg/dL (ref 0–200)
HDL: 48.9 mg/dL (ref >39.00)
LDL Cholesterol: 57 mg/dL (ref 0–99)
Total CHOL/HDL Ratio: 3
Triglycerides: 164 mg/dL — ABNORMAL HIGH (ref 0.0–149.0)
VLDL: 32.8 mg/dL (ref 0.0–40.0)

## 2013-12-14 LAB — MICROALBUMIN / CREATININE URINE RATIO
CREATININE, U: 38.5 mg/dL
MICROALB/CREAT RATIO: 1.3 mg/g (ref 0.0–30.0)
Microalb, Ur: 0.5 mg/dL (ref 0.0–1.9)

## 2013-12-14 LAB — HEMOGLOBIN A1C: HEMOGLOBIN A1C: 6.5 % (ref 4.6–6.5)

## 2013-12-14 LAB — TSH: TSH: 1.86 u[IU]/mL (ref 0.35–5.50)

## 2013-12-14 MED ORDER — METFORMIN HCL 500 MG PO TABS: 1000.0000 mg | ORAL_TABLET | Freq: Two times a day (BID) | ORAL | Status: DC

## 2013-12-14 NOTE — Progress Notes (Signed)
Subjective:    Patient ID: Cindy Larson, female    DOB: Sep 06, 1945, 68 y.o.   MRN: [id number]  HPI  Here for wellness and f/u;  Overall doing ok;  Pt denies CP, worsening SOB, DOE, wheezing, orthopnea, PND, worsening LE edema, palpitations, dizziness or syncope.  Pt denies neurological change such as new headache, facial or extremity weakness.  Pt denies polydipsia, polyuria,  Pt states overall good compliance with treatment and medications, good tolerability, and has been trying to follow lower cholesterol diet.  Pt denies worsening depressive symptoms, suicidal ideation or panic. No fever, night sweats, wt loss, loss of appetite, or other constitutional symptoms.  Pt states good ability with ADL's, has low fall risk, home safety reviewed and adequate, no other significant changes in hearing or vision, and only occasionally active with exercise.  For 2 mo having daily bedtime low sugars, so adjusted her meds to take the metformin - 2 with biggest meal. No low sugars doing this for the past wk.  No other c/0's Past Medical History  Diagnosis Date  . DIABETES MELLITUS, TYPE II   . HYPERLIPIDEMIA   . OBESITY   . ANXIETY   . DEPRESSION   . HYPERTENSION   . ALLERGIC RHINITIS   . GERD   . HIATAL HERNIA   . DIVERTICULOSIS, COLON   . OSTEOARTHRITIS     L knee  . OSTEOPENIA   . INTERMITTENT VERTIGO   . COLONIC POLYPS, HX OF   . Esophageal stricture   . Hypersomnolence   . Post-menopausal     hormone replacement therapy   Past Surgical History  Procedure Laterality Date  . Abdominal hysterectomy    . Cesarean section    . Oophorectomy      one  . Rotator cuff repair      left  . Lysis of adhesions    . Joint replacement    . Cholecystectomy      reports that she has never smoked. She has never used smokeless tobacco. She reports that she does not drink alcohol or use illicit drugs. family history includes Atrial fibrillation in her mother; Cancer in her brother, mother, and  paternal aunt; Diabetes in her other; Heart disease in her mother; Hypertension in her mother. Allergies  Allergen Reactions  . Aspirin Other (See Comments)    Stomach hurts  . Penicillins Hives    blisters  . Prevnar [Pneumococcal 13-Val Conj Vacc] Swelling    Left arm swelling  . Simvastatin     REACTION: itch   Current Outpatient Prescriptions on File Prior to Visit  Medication Sig Dispense Refill  . ALPRAZolam (XANAX) 0.25 MG tablet TAKE ONE TABLET BY MOUTH TWICE DAILY AS NEEDED FOR ANXIETY  60 tablet  2  . atorvastatin (LIPITOR) 20 MG tablet Take 20 mg by mouth daily.      . Cholecalciferol (VITAMIN D) 2000 UNITS CAPS Take 2,000 Units by mouth daily.       . cyclobenzaprine (FLEXERIL) 5 MG tablet Take 1 tablet (5 mg total) by mouth 3 (three) times daily as needed for muscle spasms.  60 tablet  1  . estradiol (ESTRACE) 1 MG tablet Take 1 mg by mouth daily.      . furosemide (LASIX) 40 MG tablet Take 40 mg by mouth.      . gabapentin (NEURONTIN) 300 MG capsule Take 1 capsule (300 mg total) by mouth 3 (three) times daily. Start with 1 capsule at bedtime then advanced to  one capsule twice a day and if need be increased to 1 capsule 3 times a day  90 capsule  5  . glimepiride (AMARYL) 4 MG tablet Take 4 mg by mouth 2 (two) times daily.      Marland Kitchen lisinopril (PRINIVIL,ZESTRIL) 20 MG tablet Take 20 mg by mouth daily.      . meclizine (ANTIVERT) 25 MG tablet Take 25 mg by mouth every 6 (six) hours as needed for dizziness.      Marland Kitchen omeprazole (PRILOSEC) 20 MG capsule Take 20 mg by mouth 2 (two) times daily before a meal.      . ONE TOUCH ULTRA TEST test strip USE AS DIRECTED ONE EVERY DAY  100 each  11  . ONETOUCH DELICA LANCETS 99991111 MISC USE AS DIRECTED ONE EVERY DAY  100 each  11  . PARoxetine (PAXIL) 20 MG tablet Take 20 mg by mouth daily.      Marland Kitchen zolpidem (AMBIEN) 5 MG tablet Take 1 tablet (5 mg total) by mouth at bedtime as needed for sleep.  30 tablet  0   No current facility-administered  medications on file prior to visit.   Review of Systems Constitutional: Negative for diaphoresis, activity change, appetite change or unexpected weight change.  HENT: Negative for hearing loss, ear pain, facial swelling, mouth sores and neck stiffness.   Eyes: Negative for pain, redness and visual disturbance.  Respiratory: Negative for shortness of breath and wheezing.   Cardiovascular: Negative for chest pain and palpitations.  Gastrointestinal: Negative for diarrhea, blood in stool, abdominal distention or other pain Genitourinary: Negative for hematuria, flank pain or change in urine volume.  Musculoskeletal: Negative for myalgias and joint swelling.  Skin: Negative for color change and wound.  Neurological: Negative for syncope and numbness. other than noted Hematological: Negative for adenopathy.  Psychiatric/Behavioral: Negative for hallucinations, self-injury, decreased concentration and agitation.      Objective:   Physical Exam BP 104/72  Pulse 105  Temp(Src) 98.4 F (36.9 C) (Oral)  Ht 4\' 11"  (1.499 m)  Wt 171 lb 2 oz (77.622 kg)  BMI 34.54 kg/m2  SpO2 98% VS noted,  Constitutional: Pt is oriented to person, place, and time. Appears well-developed and well-nourished.  Head: Normocephalic and atraumatic.  Right Ear: External ear normal.  Left Ear: External ear normal.  Nose: Nose normal.  Mouth/Throat: Oropharynx is clear and moist.  Eyes: Conjunctivae and EOM are normal. Pupils are equal, round, and reactive to light.  Neck: Normal range of motion. Neck supple. No JVD present. No tracheal deviation present.  Cardiovascular: Normal rate, regular rhythm, normal heart sounds and intact distal pulses.   Pulmonary/Chest: Effort normal and breath sounds normal.  Abdominal: Soft. Bowel sounds are normal. There is no tenderness. No HSM  Musculoskeletal: Normal range of motion. Exhibits no edema.  Lymphadenopathy:  Has no cervical adenopathy.  Neurological: Pt is alert and  oriented to person, place, and time. Pt has normal reflexes. No cranial nerve deficit.  Skin: Skin is warm and dry. No rash noted.  Psychiatric:  Has  normal mood and affect. Behavior is normal.     Assessment & Plan:

## 2013-12-14 NOTE — Patient Instructions (Signed)
Please continue all other medications as before, and refills have been done if requested. Please have the pharmacy call with any other refills you may need.  If you have further low sugars in the evening or before bed, OK to take Half or stop the evening dose of the glimeparide  Please continue your efforts at being more active, low cholesterol diet, and weight control. You are otherwise up to date with prevention measures today.  Please go to the LAB in the Basement (turn left off the elevator) for the tests to be done today  You will be contacted by phone if any changes need to be made immediately.  Otherwise, you will receive a letter about your results with an explanation, but please check with MyChart first.  Please remember to sign up for MyChart if you have not done so, as this will be important to you in the future with finding out test results, communicating by private email, and scheduling acute appointments online when needed.  Please return in 6 months, or sooner if needed, with Lab testing done 3-5 days before

## 2013-12-14 NOTE — Assessment & Plan Note (Signed)
overcontrolled possibly in evening; ok to take half or hold PM dosing of glimeparide for further low sugars

## 2013-12-14 NOTE — Assessment & Plan Note (Signed)

## 2013-12-14 NOTE — Progress Notes (Signed)
Pre visit review using our clinic review tool, if applicable. No additional management support is needed unless otherwise documented below in the visit note. 

## 2014-01-16 ENCOUNTER — Encounter: Payer: Self-pay | Admitting: Internal Medicine

## 2014-02-26 ENCOUNTER — Other Ambulatory Visit: Payer: Self-pay | Admitting: Internal Medicine

## 2014-04-16 ENCOUNTER — Other Ambulatory Visit: Payer: Self-pay | Admitting: Internal Medicine

## 2014-04-27 ENCOUNTER — Telehealth: Payer: Self-pay

## 2014-04-27 NOTE — Telephone Encounter (Signed)
Pharmacy informed and patient informed as well.

## 2014-04-27 NOTE — Telephone Encounter (Signed)
Very sorry, but since she is not on this medication regularly, and last I believe was 2013, and is a controlled substance, we would need to ask for ROV to order

## 2014-04-27 NOTE — Telephone Encounter (Signed)
[facility] is requesting a refill on Tramadol 50 mg.  Medication is not on current list.

## 2014-05-11 ENCOUNTER — Encounter: Payer: Self-pay | Admitting: Internal Medicine

## 2014-06-08 ENCOUNTER — Telehealth: Payer: Self-pay

## 2014-06-08 NOTE — Telephone Encounter (Signed)
Pt currently does not have an ophthalmologist and would like a referral.

## 2014-06-13 NOTE — Telephone Encounter (Signed)
Pt placed on referral list.

## 2014-06-15 ENCOUNTER — Ambulatory Visit (INDEPENDENT_AMBULATORY_CARE_PROVIDER_SITE_OTHER): Payer: Medicare Other | Admitting: Internal Medicine

## 2014-06-15 ENCOUNTER — Encounter: Payer: Self-pay | Admitting: Internal Medicine

## 2014-06-15 ENCOUNTER — Other Ambulatory Visit: Payer: Self-pay

## 2014-06-15 ENCOUNTER — Other Ambulatory Visit (INDEPENDENT_AMBULATORY_CARE_PROVIDER_SITE_OTHER): Payer: Medicare Other

## 2014-06-15 VITALS — BP 120/80 | HR 87 | Temp 98.0°F | Wt 174.0 lb

## 2014-06-15 DIAGNOSIS — Z23 Encounter for immunization: Secondary | ICD-10-CM

## 2014-06-15 DIAGNOSIS — IMO0002 Reserved for concepts with insufficient information to code with codable children: Secondary | ICD-10-CM

## 2014-06-15 DIAGNOSIS — E108 Type 1 diabetes mellitus with unspecified complications: Principal | ICD-10-CM

## 2014-06-15 DIAGNOSIS — Z0189 Encounter for other specified special examinations: Secondary | ICD-10-CM

## 2014-06-15 DIAGNOSIS — E1065 Type 1 diabetes mellitus with hyperglycemia: Secondary | ICD-10-CM

## 2014-06-15 DIAGNOSIS — H6502 Acute serous otitis media, left ear: Secondary | ICD-10-CM

## 2014-06-15 DIAGNOSIS — H6692 Otitis media, unspecified, left ear: Secondary | ICD-10-CM | POA: Insufficient documentation

## 2014-06-15 DIAGNOSIS — I10 Essential (primary) hypertension: Secondary | ICD-10-CM

## 2014-06-15 DIAGNOSIS — E1069 Type 1 diabetes mellitus with other specified complication: Secondary | ICD-10-CM

## 2014-06-15 DIAGNOSIS — Z0000 Encounter for general adult medical examination without abnormal findings: Secondary | ICD-10-CM

## 2014-06-15 DIAGNOSIS — E785 Hyperlipidemia, unspecified: Secondary | ICD-10-CM

## 2014-06-15 LAB — HEPATIC FUNCTION PANEL
ALK PHOS: 68 U/L (ref 39–117)
ALT: 25 U/L (ref 0–35)
AST: 22 U/L (ref 0–37)
Albumin: 3.7 g/dL (ref 3.5–5.2)
BILIRUBIN DIRECT: 0.1 mg/dL (ref 0.0–0.3)
BILIRUBIN TOTAL: 0.8 mg/dL (ref 0.2–1.2)
Total Protein: 7.6 g/dL (ref 6.0–8.3)

## 2014-06-15 LAB — HEMOGLOBIN A1C: Hgb A1c MFr Bld: 7.5 % — ABNORMAL HIGH (ref 4.6–6.5)

## 2014-06-15 LAB — LIPID PANEL
CHOL/HDL RATIO: 4
Cholesterol: 160 mg/dL (ref 0–200)
HDL: 38.2 mg/dL — AB (ref >39.00)
LDL Cholesterol: 93 mg/dL (ref 0–99)
NonHDL: 121.8
Triglycerides: 146 mg/dL (ref 0.0–149.0)
VLDL: 29.2 mg/dL (ref 0.0–40.0)

## 2014-06-15 LAB — BASIC METABOLIC PANEL
BUN: 16 mg/dL (ref 6–23)
CALCIUM: 10 mg/dL (ref 8.4–10.5)
CO2: 28 mEq/L (ref 19–32)
CREATININE: 1 mg/dL (ref 0.4–1.2)
Chloride: 102 mEq/L (ref 96–112)
GFR: 72.57 mL/min (ref >60.00)
Glucose, Bld: 125 mg/dL — ABNORMAL HIGH (ref 70–99)
Potassium: 4.5 mEq/L (ref 3.5–5.1)
SODIUM: 136 meq/L (ref 135–145)

## 2014-06-15 MED ORDER — CLARITHROMYCIN 250 MG PO TABS: 250.0000 mg | ORAL_TABLET | Freq: Two times a day (BID) | ORAL | Status: DC

## 2014-06-15 MED ORDER — TRAMADOL HCL 50 MG PO TABS: 50.0000 mg | ORAL_TABLET | Freq: Four times a day (QID) | ORAL | Status: DC | PRN

## 2014-06-15 NOTE — Assessment & Plan Note (Signed)
Mild to mod, for antibx course,  to f/u any worsening symptoms or concerns 

## 2014-06-15 NOTE — Progress Notes (Signed)
Subjective:    Patient ID: Cindy Larson, female    DOB: 1946/04/07, 68 y.o.   MRN: [id number]  HPI here with acute onset left ear pain, pressure, reduced heraring and swelling to neck just below the ear, assoc with ? Low grade temp/feelng warm, and popping in the ear itself with talking.  Pt denies chest pain, increased sob or doe, wheezing, orthopnea, PND, increased LE swelling, palpitations, dizziness or syncope. .Pt denies new neurological symptoms such as new headache, or facial or extremity weakness or numbness   Pt denies polydipsia, polyuria.  Also with ongoing bilat knee pain, gets by with infreq tramadol use, last rx was 2013, but now out.  Has intermittent swelling, pain bilat but no giveaways or falls.   Past Medical History  Diagnosis Date  . DIABETES MELLITUS, TYPE II   . HYPERLIPIDEMIA   . OBESITY   . ANXIETY   . DEPRESSION   . HYPERTENSION   . ALLERGIC RHINITIS   . GERD   . HIATAL HERNIA   . DIVERTICULOSIS, COLON   . OSTEOARTHRITIS     L knee  . OSTEOPENIA   . INTERMITTENT VERTIGO   . COLONIC POLYPS, HX OF   . Esophageal stricture   . Hypersomnolence   . Post-menopausal     hormone replacement therapy   Past Surgical History  Procedure Laterality Date  . Abdominal hysterectomy    . Cesarean section    . Oophorectomy      one  . Rotator cuff repair      left  . Lysis of adhesions    . Joint replacement    . Cholecystectomy      reports that she has never smoked. She has never used smokeless tobacco. She reports that she does not drink alcohol or use illicit drugs. family history includes Atrial fibrillation in her mother; Cancer in her brother, mother, and paternal aunt; Diabetes in her other; Heart disease in her mother; Hypertension in her mother. Allergies  Allergen Reactions  . Aspirin Other (See Comments)    Stomach hurts  . Penicillins Hives    blisters  . Prevnar [Pneumococcal 13-Val Conj Vacc] Swelling    Left arm swelling  . Simvastatin      REACTION: itch   Current Outpatient Prescriptions on File Prior to Visit  Medication Sig Dispense Refill  . ALPRAZolam (XANAX) 0.25 MG tablet TAKE ONE TABLET BY MOUTH TWICE DAILY AS NEEDED FOR ANXIETY  60 tablet  2  . atorvastatin (LIPITOR) 20 MG tablet Take 20 mg by mouth daily.      . Cholecalciferol (VITAMIN D) 2000 UNITS CAPS Take 2,000 Units by mouth daily.       . cyclobenzaprine (FLEXERIL) 5 MG tablet Take 1 tablet (5 mg total) by mouth 3 (three) times daily as needed for muscle spasms.  60 tablet  1  . estradiol (ESTRACE) 1 MG tablet Take 1 mg by mouth daily.      . furosemide (LASIX) 40 MG tablet Take 40 mg by mouth.      . gabapentin (NEURONTIN) 300 MG capsule Take 1 capsule (300 mg total) by mouth 3 (three) times daily. Start with 1 capsule at bedtime then advanced to one capsule twice a day and if need be increased to 1 capsule 3 times a day  90 capsule  5  . glimepiride (AMARYL) 4 MG tablet Take 4 mg by mouth 2 (two) times daily.      Marland Kitchen lisinopril (PRINIVIL,ZESTRIL) 20  MG tablet Take 20 mg by mouth daily.      . meclizine (ANTIVERT) 25 MG tablet Take 25 mg by mouth every 6 (six) hours as needed for dizziness.      . metFORMIN (GLUCOPHAGE) 500 MG tablet Take 2 tablets (1,000 mg total) by mouth 2 (two) times daily with a meal.  420 tablet  3  . omeprazole (PRILOSEC) 20 MG capsule Take 20 mg by mouth 2 (two) times daily before a meal.      . ONE TOUCH ULTRA TEST test strip USE AS DIRECTED ONE EVERY DAY  100 each  11  . ONETOUCH DELICA LANCETS 99991111 MISC USE AS DIRECTED ONE EVERY DAY  100 each  11  . PARoxetine (PAXIL) 20 MG tablet Take 20 mg by mouth daily.      Marland Kitchen zolpidem (AMBIEN) 5 MG tablet Take 1 tablet (5 mg total) by mouth at bedtime as needed for sleep.  30 tablet  0   No current facility-administered medications on file prior to visit.     Review of Systems  Constitutional: Negative for unusual diaphoresis or other sweats  HENT: Negative for ringing in ear Eyes:  Negative for double vision or worsening visual disturbance.  Respiratory: Negative for choking and stridor.   Gastrointestinal: Negative for vomiting or other signifcant bowel change Genitourinary: Negative for hematuria or decreased urine volume.  Musculoskeletal: Negative for other MSK pain or swelling Skin: Negative for color change and worsening wound.  Neurological: Negative for tremors and numbness other than noted  Psychiatric/Behavioral: Negative for decreased concentration or agitation other than above  '    Objective:   Physical Exam BP 120/80  Pulse 87  Temp(Src) 98 F (36.7 C) (Oral)  Wt 174 lb (78.926 kg)  SpO2 97% VS noted, not ill appearing but with knee pain, limps to walk Constitutional: Pt appears well-developed, well-nourished.  HENT: Head: NCAT.  Right Ear: External ear normal.  Left Ear: External ear normal. Left TM with mod erythema, post effusion Eyes: . Pupils are equal, round, and reactive to light. Conjunctivae and EOM are normal Neck: Normal range of motion. Neck supple.  Cardiovascular: Normal rate and regular rhythm.   Pulmonary/Chest: Effort normal and breath sounds normal.  Neurological: Pt is alert. Not confused , motor grossly intact Skin: Skin is warm. No rash Psychiatric: Pt behavior is normal. No agitation. mild nervous    Assessment & Plan:

## 2014-06-15 NOTE — Progress Notes (Signed)
Pre visit review using our clinic review tool, if applicable. No additional management support is needed unless otherwise documented below in the visit note. 

## 2014-06-15 NOTE — Assessment & Plan Note (Signed)
stable overall by history and exam, recent data reviewed with pt, and pt to continue medical treatment as before,  to f/u any worsening symptoms or concerns / BP Readings from Last 3 Encounters:  06/15/14 120/80  12/14/13 104/72  07/25/13 110/72

## 2014-06-15 NOTE — Assessment & Plan Note (Signed)
stable overall by history and exam, recent data reviewed with pt, and pt to continue medical treatment as before,  to f/u any worsening symptoms or concerns Lab Results  Component Value Date   LDLCALC 57 12/14/2013   For f/u lab today, cont diet

## 2014-06-15 NOTE — Assessment & Plan Note (Signed)
stable overall by history and exam, recent data reviewed with pt, and pt to continue medical treatment as before,  to f/u any worsening symptoms or concerns Lab Results  Component Value Date   HGBA1C 6.5 12/14/2013

## 2014-06-15 NOTE — Patient Instructions (Addendum)
Please take all new medication as prescribed- the antibiotic  You can also take Delsym OTC for cough, and/or Mucinex Sinus (or it's generic off brand) for congestion, and tylenol as needed for pain.  You could also consider otc zyrtec and/or nasacort as well for allergies to see if this helps  Please continue all other medications as before, including the tramadol refill  Please have the pharmacy call with any other refills you may need.  Please keep your appointments with your specialists as you may have planned  Please go to the LAB in the Basement (turn left off the elevator) for the tests to be done today  You will be contacted by phone if any changes need to be made immediately.  Otherwise, you will receive a letter about your results with an explanation, but please check with MyChart first.  Please remember to sign up for MyChart if you have not done so, as this will be important to you in the future with finding out test results, communicating by private email, and scheduling acute appointments online when needed.  Please return in 6 months, or sooner if needed, with Lab testing done 3-5 days before

## 2014-06-18 ENCOUNTER — Telehealth: Payer: Self-pay | Admitting: Internal Medicine

## 2014-06-18 NOTE — Telephone Encounter (Signed)
emmi emailed °

## 2014-06-27 ENCOUNTER — Other Ambulatory Visit: Payer: Self-pay | Admitting: Internal Medicine

## 2014-06-27 DIAGNOSIS — Z1231 Encounter for screening mammogram for malignant neoplasm of breast: Secondary | ICD-10-CM

## 2014-07-11 ENCOUNTER — Telehealth: Payer: Self-pay | Admitting: Internal Medicine

## 2014-07-11 NOTE — Telephone Encounter (Signed)
Patient needs a referral for an Optometrist.

## 2014-07-11 NOTE — Telephone Encounter (Signed)
Patient informed of MD instructions on referral.

## 2014-07-11 NOTE — Telephone Encounter (Signed)
Is she sure, b/c this would be unusual, and most persons do not need this  Remember optometrists are not the same as opthomology

## 2014-08-14 ENCOUNTER — Ambulatory Visit (HOSPITAL_COMMUNITY): Payer: Medicare Other

## 2014-09-04 ENCOUNTER — Ambulatory Visit (HOSPITAL_COMMUNITY)
Admission: RE | Admit: 2014-09-04 | Discharge: 2014-09-04 | Disposition: A | Discharge status: Home / Self Care | Payer: Medicare Other | Source: Ambulatory Visit | Attending: Internal Medicine | Admitting: Internal Medicine

## 2014-09-04 DIAGNOSIS — Z1231 Encounter for screening mammogram for malignant neoplasm of breast: Secondary | ICD-10-CM | POA: Insufficient documentation

## 2014-09-14 DIAGNOSIS — H40013 Open angle with borderline findings, low risk, bilateral: Secondary | ICD-10-CM | POA: Diagnosis not present

## 2014-09-14 DIAGNOSIS — H16223 Keratoconjunctivitis sicca, not specified as Sjogren's, bilateral: Secondary | ICD-10-CM | POA: Diagnosis not present

## 2014-09-14 DIAGNOSIS — E119 Type 2 diabetes mellitus without complications: Secondary | ICD-10-CM | POA: Diagnosis not present

## 2014-11-23 ENCOUNTER — Other Ambulatory Visit: Payer: Self-pay | Admitting: Internal Medicine

## 2014-12-06 ENCOUNTER — Telehealth: Payer: Self-pay

## 2014-12-06 NOTE — Telephone Encounter (Signed)
Cindy Larson was not available during this call, but Cindy Larson agreed to share information regarding the AWV and she will call the office if interested.

## 2015-01-15 ENCOUNTER — Other Ambulatory Visit: Payer: Self-pay | Admitting: Internal Medicine

## 2015-01-16 ENCOUNTER — Ambulatory Visit (INDEPENDENT_AMBULATORY_CARE_PROVIDER_SITE_OTHER): Payer: Medicare Other

## 2015-01-16 ENCOUNTER — Ambulatory Visit (INDEPENDENT_AMBULATORY_CARE_PROVIDER_SITE_OTHER): Payer: Medicare Other | Admitting: Internal Medicine

## 2015-01-16 ENCOUNTER — Encounter: Payer: Self-pay | Admitting: Internal Medicine

## 2015-01-16 ENCOUNTER — Other Ambulatory Visit (INDEPENDENT_AMBULATORY_CARE_PROVIDER_SITE_OTHER): Payer: Medicare Other

## 2015-01-16 VITALS — BP 126/84 | Ht 59.0 in | Wt 175.0 lb

## 2015-01-16 VITALS — BP 126/84 | HR 94 | Temp 99.0°F | Wt 175.0 lb

## 2015-01-16 DIAGNOSIS — I10 Essential (primary) hypertension: Secondary | ICD-10-CM | POA: Diagnosis not present

## 2015-01-16 DIAGNOSIS — E119 Type 2 diabetes mellitus without complications: Secondary | ICD-10-CM | POA: Diagnosis not present

## 2015-01-16 DIAGNOSIS — E785 Hyperlipidemia, unspecified: Secondary | ICD-10-CM | POA: Diagnosis not present

## 2015-01-16 DIAGNOSIS — Z86010 Personal history of colonic polyps: Secondary | ICD-10-CM | POA: Diagnosis not present

## 2015-01-16 DIAGNOSIS — Z0000 Encounter for general adult medical examination without abnormal findings: Secondary | ICD-10-CM

## 2015-01-16 DIAGNOSIS — M8580 Other specified disorders of bone density and structure, unspecified site: Secondary | ICD-10-CM | POA: Diagnosis not present

## 2015-01-16 LAB — LIPID PANEL
Cholesterol: 152 mg/dL (ref 0–200)
HDL: 48.9 mg/dL (ref >39.00)
LDL Cholesterol: 73 mg/dL (ref 0–99)
NonHDL: 103.1
Total CHOL/HDL Ratio: 3
Triglycerides: 152 mg/dL — ABNORMAL HIGH (ref 0.0–149.0)
VLDL: 30.4 mg/dL (ref 0.0–40.0)

## 2015-01-16 LAB — URINALYSIS, ROUTINE W REFLEX MICROSCOPIC
BILIRUBIN URINE: NEGATIVE
Hgb urine dipstick: NEGATIVE
KETONES UR: NEGATIVE
Leukocytes, UA: NEGATIVE
Nitrite: NEGATIVE
RBC / HPF: NONE SEEN (ref 0–?)
Total Protein, Urine: NEGATIVE
URINE GLUCOSE: NEGATIVE
Urobilinogen, UA: 0.2 (ref 0.0–1.0)
pH: 5.5 (ref 5.0–8.0)

## 2015-01-16 LAB — BASIC METABOLIC PANEL
BUN: 15 mg/dL (ref 6–23)
CO2: 27 meq/L (ref 19–32)
CREATININE: 0.99 mg/dL (ref 0.40–1.20)
Calcium: 9.9 mg/dL (ref 8.4–10.5)
Chloride: 102 mEq/L (ref 96–112)
GFR: 71.6 mL/min (ref >60.00)
Glucose, Bld: 100 mg/dL — ABNORMAL HIGH (ref 70–99)
POTASSIUM: 4.6 meq/L (ref 3.5–5.1)
Sodium: 136 mEq/L (ref 135–145)

## 2015-01-16 LAB — CBC WITH DIFFERENTIAL/PLATELET
Basophils Absolute: 0 10*3/uL (ref 0.0–0.1)
Basophils Relative: 0.8 % (ref 0.0–3.0)
EOS PCT: 3.2 % (ref 0.0–5.0)
Eosinophils Absolute: 0.2 10*3/uL (ref 0.0–0.7)
HCT: 37.3 % (ref 36.0–46.0)
HEMOGLOBIN: 12.5 g/dL (ref 12.0–15.0)
LYMPHS PCT: 53.3 % — AB (ref 12.0–46.0)
Lymphs Abs: 3.1 10*3/uL (ref 0.7–4.0)
MCHC: 33.5 g/dL (ref 30.0–36.0)
MCV: 86.5 fl (ref 78.0–100.0)
Monocytes Absolute: 0.3 10*3/uL (ref 0.1–1.0)
Monocytes Relative: 5.6 % (ref 3.0–12.0)
NEUTROS PCT: 37.1 % — AB (ref 43.0–77.0)
Neutro Abs: 2.2 10*3/uL (ref 1.4–7.7)
PLATELETS: 300 10*3/uL (ref 150.0–400.0)
RBC: 4.31 Mil/uL (ref 3.87–5.11)
RDW: 13.7 % (ref 11.5–15.5)
WBC: 5.8 10*3/uL (ref 4.0–10.5)

## 2015-01-16 LAB — HEPATIC FUNCTION PANEL
ALBUMIN: 4.5 g/dL (ref 3.5–5.2)
ALK PHOS: 63 U/L (ref 39–117)
ALT: 17 U/L (ref 0–35)
AST: 16 U/L (ref 0–37)
Bilirubin, Direct: 0.1 mg/dL (ref 0.0–0.3)
Total Bilirubin: 0.6 mg/dL (ref 0.2–1.2)
Total Protein: 7.4 g/dL (ref 6.0–8.3)

## 2015-01-16 LAB — HEMOGLOBIN A1C: Hgb A1c MFr Bld: 7.1 % — ABNORMAL HIGH (ref 4.6–6.5)

## 2015-01-16 LAB — MICROALBUMIN / CREATININE URINE RATIO
CREATININE, U: 183 mg/dL
MICROALB UR: 2.2 mg/dL — AB (ref 0.0–1.9)
Microalb Creat Ratio: 1.2 mg/g (ref 0.0–30.0)

## 2015-01-16 LAB — TSH: TSH: 1.35 u[IU]/mL (ref 0.35–4.50)

## 2015-01-16 NOTE — Assessment & Plan Note (Signed)
Also due for colonoscopy later this yr , will refer

## 2015-01-16 NOTE — Progress Notes (Addendum)
Subjective:    Patient ID: Cindy Larson, female    DOB: 1945-12-26, 69 y.o.   MRN: [id number]  HPI  Here for wellness and f/u;  Overall doing ok;  Pt denies Chest pain, worsening SOB, DOE, wheezing, orthopnea, PND, worsening LE edema, palpitations, dizziness or syncope.  Pt denies neurological change such as new headache, facial or extremity weakness.  Pt denies polydipsia, polyuria, or low sugar symptoms. Pt states overall good compliance with treatment and medications, good tolerability, and has been trying to follow appropriate diet.  Pt denies worsening depressive symptoms, suicidal ideation or panic. No fever, night sweats, wt loss, loss of appetite, or other constitutional symptoms.  Pt states good ability with ADL's, has low fall risk, home safety reviewed and adequate, no other significant changes in hearing or vision, and only occasionally active with exercise.  Does have several wks ongoing nasal allergy symptoms with clearish congestion, itch and sneezing, without fever, pain, ST, cough, swelling or wheezing.  Also with some mild intermittent low mid and left side abd pains last few days but Denies urinary symptoms such as dysuria, frequency, urgency, flank pain, hematuria or n/v, fever, chills. Past Medical History  Diagnosis Date  . DIABETES MELLITUS, TYPE II   . HYPERLIPIDEMIA   . OBESITY   . ANXIETY   . DEPRESSION   . HYPERTENSION   . ALLERGIC RHINITIS   . GERD   . HIATAL HERNIA   . DIVERTICULOSIS, COLON   . OSTEOARTHRITIS     L knee  . OSTEOPENIA   . INTERMITTENT VERTIGO   . COLONIC POLYPS, HX OF   . Esophageal stricture   . Hypersomnolence   . Post-menopausal     hormone replacement therapy   Past Surgical History  Procedure Laterality Date  . Abdominal hysterectomy    . Cesarean section    . Oophorectomy      one  . Rotator cuff repair      left  . Lysis of adhesions    . Joint replacement    . Cholecystectomy      reports that she has never smoked.  She has never used smokeless tobacco. She reports that she does not drink alcohol or use illicit drugs. family history includes Atrial fibrillation in her mother; Cancer in her brother, mother, and paternal aunt; Diabetes in her other; Heart disease in her mother; Hypertension in her mother. Allergies  Allergen Reactions  . Aspirin Other (See Comments)    Stomach hurts  . Penicillins Hives    blisters  . Prevnar [Pneumococcal 13-Val Conj Vacc] Swelling    Left arm swelling  . Simvastatin     REACTION: itch   Current Outpatient Prescriptions on File Prior to Visit  Medication Sig Dispense Refill  . atorvastatin (LIPITOR) 20 MG tablet Take 20 mg by mouth daily.    . Cholecalciferol (VITAMIN D) 2000 UNITS CAPS Take 2,000 Units by mouth daily.     . cyclobenzaprine (FLEXERIL) 5 MG tablet Take 1 tablet (5 mg total) by mouth 3 (three) times daily as needed for muscle spasms. 60 tablet 1  . furosemide (LASIX) 40 MG tablet Take 40 mg by mouth.    Marland Kitchen glimepiride (AMARYL) 4 MG tablet TAKE ONE TABLET BY MOUTH TWICE DAILY 180 tablet 3  . lisinopril (PRINIVIL,ZESTRIL) 20 MG tablet Take 20 mg by mouth daily.    . meclizine (ANTIVERT) 25 MG tablet Take 25 mg by mouth every 6 (six) hours as needed for dizziness.    Marland Kitchen  metFORMIN (GLUCOPHAGE) 500 MG tablet TAKE TWO TABLETS BY MOUTH TWICE DAILY WITH A MEAL 360 tablet 0  . omeprazole (PRILOSEC) 20 MG capsule Take 20 mg by mouth 2 (two) times daily before a meal.    . ONE TOUCH ULTRA TEST test strip USE AS DIRECTED ONE EVERY DAY 100 each 0  . ONETOUCH DELICA LANCETS 99991111 MISC USE AS DIRECTED ONE EVERY DAY 100 each 0  . PARoxetine (PAXIL) 20 MG tablet Take 20 mg by mouth daily.    . traMADol (ULTRAM) 50 MG tablet Take 1 tablet (50 mg total) by mouth every 6 (six) hours as needed. 60 tablet 1  . ALPRAZolam (XANAX) 0.25 MG tablet TAKE ONE TABLET BY MOUTH TWICE DAILY AS NEEDED FOR ANXIETY (Patient not taking: Reported on 01/16/2015) 60 tablet 2  . clarithromycin  (BIAXIN) 250 MG tablet Take 1 tablet (250 mg total) by mouth 2 (two) times daily. (Patient not taking: Reported on 01/16/2015) 20 tablet 0  . estradiol (ESTRACE) 1 MG tablet Take 1 mg by mouth daily.    Marland Kitchen gabapentin (NEURONTIN) 300 MG capsule Take 1 capsule (300 mg total) by mouth 3 (three) times daily. Start with 1 capsule at bedtime then advanced to one capsule twice a day and if need be increased to 1 capsule 3 times a day 90 capsule 5  . glimepiride (AMARYL) 4 MG tablet Take 4 mg by mouth 2 (two) times daily.    Marland Kitchen zolpidem (AMBIEN) 5 MG tablet Take 1 tablet (5 mg total) by mouth at bedtime as needed for sleep. (Patient not taking: Reported on 01/16/2015) 30 tablet 0   No current facility-administered medications on file prior to visit.   Review of Systems Constitutional: Negative for increased diaphoresis, other activity, appetite or siginficant weight change other than noted HENT: Negative for worsening hearing loss, ear pain, facial swelling, mouth sores and neck stiffness.   Eyes: Negative for other worsening pain, redness or visual disturbance.  Respiratory: Negative for shortness of breath and wheezing  Cardiovascular: Negative for chest pain and palpitations.  Gastrointestinal: Negative for diarrhea, blood in stool, abdominal distention or other pain Genitourinary: Negative for hematuria, flank pain or change in urine volume.  Musculoskeletal: Negative for myalgias or other joint complaints.  Skin: Negative for color change and wound or drainage.  Neurological: Negative for syncope and numbness. other than noted Hematological: Negative for adenopathy. or other swelling Psychiatric/Behavioral: Negative for hallucinations, SI, self-injury, decreased concentration or other worsening agitation.      Objective:   Physical Exam BP 126/84 mmHg  Pulse 94  Temp(Src) 99 F (37.2 C) (Oral)  Wt 175 lb (79.379 kg)  SpO2 98% VS noted,  Constitutional: Pt is oriented to person, place, and  time. Appears well-developed and well-nourished, in no significant distress Head: Normocephalic and atraumatic.  Right Ear: External ear normal.  Left Ear: External ear normal.  Nose: Nose normal.  Mouth/Throat: Oropharynx is clear and moist.  Eyes: Conjunctivae and EOM are normal. Pupils are equal, round, and reactive to light.  Neck: Normal range of motion. Neck supple. No JVD present. No tracheal deviation present or significant neck LA or mass Cardiovascular: Normal rate, regular rhythm, normal heart sounds and intact distal pulses.   Pulmonary/Chest: Effort normal and breath sounds without rales or wheezing  Abdominal: Soft. Bowel sounds are normal. NT except for minor tender LLQ. No HSM  Musculoskeletal: Normal range of motion. Exhibits no edema.  Lymphadenopathy:  Has no cervical adenopathy.  Neurological: Pt is  alert and oriented to person, place, and time. Pt has normal reflexes. No cranial nerve deficit. Motor grossly intact Skin: Skin is warm and dry. No rash noted.  Psychiatric:  Has normal mood and affect. Behavior is normal.    ECG 12 lead:   NSR with non specific T wave changes only Cathlean Cower MD    Assessment & Plan:

## 2015-01-16 NOTE — Assessment & Plan Note (Signed)
stable overall by history and exam, recent data reviewed with pt, and pt to continue medical treatment as before,  to f/u any worsening symptoms or concerns Lab Results  Component Value Date   HGBA1C 7.5* 06/15/2014   For f/u a1c today

## 2015-01-16 NOTE — Progress Notes (Signed)
Pre visit review using our clinic review tool, if applicable. No additional management support is needed unless otherwise documented below in the visit note. 

## 2015-01-16 NOTE — Patient Instructions (Addendum)
Cindy Larson , Thank you for taking time to come for your Medicare Wellness Visit. I appreciate your ongoing commitment to your health goals. Please review the following plan we discussed and let me know if I can assist you in the future.   These are the goals we discussed: Goals    . Prevent Falls     Hx 3 falls in the last year;  (one due to bee stings; the other on concrete) One she slipped when wet and missed step; and tripped on rug in home Will start exercise at silver sneakers; and walking; go slow when raining;  Recommend strength training and balance / discussed exercises and agrees to attempt [employer] x 2 and walking at least 2 times a week         This is a list of the screening recommended for you and due dates:  Health Maintenance  Topic Date Due  . Eye exam for diabetics  05/27/1956  . Shingles Vaccine  05/27/2006  . DEXA scan (bone density measurement)  05/28/2011  . Complete foot exam   12/15/2014  . Hemoglobin A1C  12/15/2014  . Urine Protein Check  12/15/2014  . Flu Shot  04/01/2015  . Colon Cancer Screening  05/20/2015  . Pneumonia vaccines (2 of 2 - PPSV23) 10/25/2015  . Mammogram  09/04/2016  . Tetanus Vaccine  10/24/2020   Patient will have labs drawn today Patient will have bone density scheduled today Eye exams now q 4 months with Dr. Tora Kindred Dr. Jenny Reichmann completed foot exam on visit today Referred to GI for fup to colonoscopy per Dr. Lanice Shirts Densitometry Bone densitometry is a special X-ray that measures your bone density and can be used to help predict your risk of bone fractures. This test is used to determine bone mineral content and density to diagnose osteoporosis. Osteoporosis is the loss of bone that may cause the bone to become weak. Osteoporosis commonly occurs in women entering menopause. However, it may be found in men and in people with other diseases. PREPARATION FOR TEST No preparation necessary. WHO SHOULD BE TESTED?  All  women older than 91.  Postmenopausal women (50 to 64) with risk factors for osteoporosis.  People with a previous fracture caused by normal activities.  People with a small body frame (less than 127 poundsor a body mass index [BMI] of less than 21).  People who have a parent with a hip fracture or history of osteoporosis.  People who smoke.  People who have rheumatoid arthritis.  Anyone who engages in excessive alcohol use (more than 3 drinks most days).  Women who experience early menopause. WHEN SHOULD YOU BE RETESTED? Current guidelines suggest that you should wait at least 2 years before doing a bone density test again if your first test was normal.Recent studies indicated that women with normal bone density may be able to wait a few years before needing to repeat a bone density test. You should discuss this with your caregiver.  NORMAL FINDINGS   Normal: less than standard deviation below normal (greater than -1).  Osteopenia: 1 to 2.5 standard deviations below normal (-1 to -2.5).  Osteoporosis: greater than 2.5 standard deviations below normal (less than -2.5). Test results are reported as a "T score" and a "Z score."The T score is a number that compares your bone density with the bone density of healthy, young women.The Z score is a number that compares your bone density with the scores of women  who are the same age, gender, and race.  Ranges for normal findings may vary among different laboratories and hospitals. You should always check with your doctor after having lab work or other tests done to discuss the meaning of your test results and whether your values are considered within normal limits. MEANING OF TEST  Your caregiver will go over the test results with you and discuss the importance and meaning of your results, as well as treatment options and the need for additional tests if necessary. OBTAINING THE TEST RESULTS It is your responsibility to obtain your test  results. Ask the lab or department performing the test when and how you will get your results. Document Released: 09/08/2004 Document Revised: 11/09/2011 Document Reviewed: 10/01/2010 [facility] Patient Information 2015 [place], [place]. This information is not intended to replace advice given to you by your health care provider. Make sure you discuss any questions you have with your health care provider.  Fall Prevention and Home Safety Falls cause injuries and can affect all age groups. It is possible to prevent falls.  HOW TO PREVENT FALLS  Wear shoes with rubber soles that do not have an opening for your toes.  Keep the inside and outside of your house well lit.  Use night lights throughout your home.  Remove clutter from floors.  Clean up floor spills.  Remove throw rugs or fasten them to the floor with carpet tape.  Do not place electrical cords across pathways.  Put grab bars by your tub, shower, and toilet. Do not use towel bars as grab bars.  Put handrails on both sides of the stairway. Fix loose handrails.  Do not climb on stools or stepladders, if possible.  Do not wax your floors.  Repair uneven or unsafe sidewalks, walkways, or stairs.  Keep items you use a lot within reach.  Be aware of pets.  Keep emergency numbers next to the telephone.  Put smoke detectors in your home and near bedrooms. Ask your doctor what other things you can do to prevent falls. Document Released: 06/13/2009 Document Revised: 02/16/2012 Document Reviewed: 11/17/2011 [facility] Patient Information 2015 [place], [place]. This information is not intended to replace advice given to you by your health care provider. Make sure you discuss any questions you have with your health care provider.  Health Maintenance Adopting a healthy lifestyle and getting preventive care can go a long way to promote health and wellness. Talk with your health care provider about what schedule of regular examinations  is right for you. This is a good chance for you to check in with your provider about disease prevention and staying healthy. In between checkups, there are plenty of things you can do on your own. Experts have done a lot of research about which lifestyle changes and preventive measures are most likely to keep you healthy. Ask your health care provider for more information. WEIGHT AND DIET  Eat a healthy diet  Be sure to include plenty of vegetables, fruits, low-fat dairy products, and lean protein.  Do not eat a lot of foods high in solid fats, added sugars, or salt.  Get regular exercise. This is one of the most important things you can do for your health.  Most adults should exercise for at least 150 minutes each week. The exercise should increase your heart rate and make you sweat (moderate-intensity exercise).  Most adults should also do strengthening exercises at least twice a week. This is in addition to the moderate-intensity exercise.  Maintain  a healthy weight  Body mass index (BMI) is a measurement that can be used to identify possible weight problems. It estimates body fat based on height and weight. Your health care provider can help determine your BMI and help you achieve or maintain a healthy weight.  For females 63 years of age and older:   A BMI below 18.5 is considered underweight.  A BMI of 18.5 to 24.9 is normal.  A BMI of 25 to 29.9 is considered overweight.  A BMI of 30 and above is considered obese.  Watch levels of cholesterol and blood lipids  You should start having your blood tested for lipids and cholesterol at 69 years of age, then have this test every 5 years.  You may need to have your cholesterol levels checked more often if:  Your lipid or cholesterol levels are high.  You are older than 69 years of age.  You are at high risk for heart disease.  CANCER SCREENING   Lung Cancer  Lung cancer screening is recommended for adults 31-80 years  old who are at high risk for lung cancer because of a history of smoking.  A yearly low-dose CT scan of the lungs is recommended for people who:  Currently smoke.  Have quit within the past 15 years.  Have at least a 30-pack-year history of smoking. A pack year is smoking an average of one pack of cigarettes a day for 1 year.  Yearly screening should continue until it has been 15 years since you quit.  Yearly screening should stop if you develop a health problem that would prevent you from having lung cancer treatment.  Breast Cancer  Practice breast self-awareness. This means understanding how your breasts normally appear and feel.  It also means doing regular breast self-exams. Let your health care provider know about any changes, no matter how small.  If you are in your 20s or 30s, you should have a clinical breast exam (CBE) by a health care provider every 1-3 years as part of a regular health exam.  If you are 31 or older, have a CBE every year. Also consider having a breast X-ray (mammogram) every year.  If you have a family history of breast cancer, talk to your health care provider about genetic screening.  If you are at high risk for breast cancer, talk to your health care provider about having an MRI and a mammogram every year.  Breast cancer gene (BRCA) assessment is recommended for women who have family members with BRCA-related cancers. BRCA-related cancers include:  Breast.  Ovarian.  Tubal.  Peritoneal cancers.  Results of the assessment will determine the need for genetic counseling and BRCA1 and BRCA2 testing. Cervical Cancer Routine pelvic examinations to screen for cervical cancer are no longer recommended for nonpregnant women who are considered low risk for cancer of the pelvic organs (ovaries, uterus, and vagina) and who do not have symptoms. A pelvic examination may be necessary if you have symptoms including those associated with pelvic infections. Ask  your health care provider if a screening pelvic exam is right for you.   The Pap test is the screening test for cervical cancer for women who are considered at risk.  If you had a hysterectomy for a problem that was not cancer or a condition that could lead to cancer, then you no longer need Pap tests.  If you are older than 65 years, and you have had normal Pap tests for the past 10 years,  you no longer need to have Pap tests.  If you have had past treatment for cervical cancer or a condition that could lead to cancer, you need Pap tests and screening for cancer for at least 20 years after your treatment.  If you no longer get a Pap test, assess your risk factors if they change (such as having a new sexual partner). This can affect whether you should start being screened again.  Some women have medical problems that increase their chance of getting cervical cancer. If this is the case for you, your health care provider may recommend more frequent screening and Pap tests.  The human papillomavirus (HPV) test is another test that may be used for cervical cancer screening. The HPV test looks for the virus that can cause cell changes in the cervix. The cells collected during the Pap test can be tested for HPV.  The HPV test can be used to screen women 43 years of age and older. Getting tested for HPV can extend the interval between normal Pap tests from three to five years.  An HPV test also should be used to screen women of any age who have unclear Pap test results.  After 69 years of age, women should have HPV testing as often as Pap tests.  Colorectal Cancer  This type of cancer can be detected and often prevented.  Routine colorectal cancer screening usually begins at 69 years of age and continues through 69 years of age.  Your health care provider may recommend screening at an earlier age if you have risk factors for colon cancer.  Your health care provider may also recommend using  home test kits to check for hidden blood in the stool.  A small camera at the end of a tube can be used to examine your colon directly (sigmoidoscopy or colonoscopy). This is done to check for the earliest forms of colorectal cancer.  Routine screening usually begins at age 61.  Direct examination of the colon should be repeated every 5-10 years through 69 years of age. However, you may need to be screened more often if early forms of precancerous polyps or small growths are found. Skin Cancer  Check your skin from head to toe regularly.  Tell your health care provider about any new moles or changes in moles, especially if there is a change in a mole's shape or color.  Also tell your health care provider if you have a mole that is larger than the size of a pencil eraser.  Always use sunscreen. Apply sunscreen liberally and repeatedly throughout the day.  Protect yourself by wearing long sleeves, pants, a wide-brimmed hat, and sunglasses whenever you are outside. HEART DISEASE, DIABETES, AND HIGH BLOOD PRESSURE   Have your blood pressure checked at least every 1-2 years. High blood pressure causes heart disease and increases the risk of stroke.  If you are between 75 years and 54 years old, ask your health care provider if you should take aspirin to prevent strokes.  Have regular diabetes screenings. This involves taking a blood sample to check your fasting blood sugar level.  If you are at a normal weight and have a low risk for diabetes, have this test once every three years after 69 years of age.  If you are overweight and have a high risk for diabetes, consider being tested at a younger age or more often. PREVENTING INFECTION  Hepatitis B  If you have a higher risk for hepatitis B, you should  be screened for this virus. You are considered at high risk for hepatitis B if:  You were born in a country where hepatitis B is common. Ask your health care provider which countries are  considered high risk.  Your parents were born in a high-risk country, and you have not been immunized against hepatitis B (hepatitis B vaccine).  You have HIV or AIDS.  You use needles to inject street drugs.  You live with someone who has hepatitis B.  You have had sex with someone who has hepatitis B.  You get hemodialysis treatment.  You take certain medicines for conditions, including cancer, organ transplantation, and autoimmune conditions. Hepatitis C  Blood testing is recommended for:  Everyone born from [birth year] through 1965.  Anyone with known risk factors for hepatitis C. Sexually transmitted infections (STIs)  You should be screened for sexually transmitted infections (STIs) including gonorrhea and chlamydia if:  You are sexually active and are younger than 69 years of age.  You are older than 69 years of age and your health care provider tells you that you are at risk for this type of infection.  Your sexual activity has changed since you were last screened and you are at an increased risk for chlamydia or gonorrhea. Ask your health care provider if you are at risk.  If you do not have HIV, but are at risk, it may be recommended that you take a prescription medicine daily to prevent HIV infection. This is called pre-exposure prophylaxis (PrEP). You are considered at risk if:  You are sexually active and do not regularly use condoms or know the HIV status of your partner(s).  You take drugs by injection.  You are sexually active with a partner who has HIV. Talk with your health care provider about whether you are at high risk of being infected with HIV. If you choose to begin PrEP, you should first be tested for HIV. You should then be tested every 3 months for as long as you are taking PrEP.  PREGNANCY   If you are premenopausal and you may become pregnant, ask your health care provider about preconception counseling.  If you may become pregnant, take 400 to 800  micrograms (mcg) of folic acid every day.  If you want to prevent pregnancy, talk to your health care provider about birth control (contraception). OSTEOPOROSIS AND MENOPAUSE   Osteoporosis is a disease in which the bones lose minerals and strength with aging. This can result in serious bone fractures. Your risk for osteoporosis can be identified using a bone density scan.  If you are 89 years of age or older, or if you are at risk for osteoporosis and fractures, ask your health care provider if you should be screened.  Ask your health care provider whether you should take a calcium or vitamin D supplement to lower your risk for osteoporosis.  Menopause may have certain physical symptoms and risks.  Hormone replacement therapy may reduce some of these symptoms and risks. Talk to your health care provider about whether hormone replacement therapy is right for you.  HOME CARE INSTRUCTIONS   Schedule regular health, dental, and eye exams.  Stay current with your immunizations.   Do not use any tobacco products including cigarettes, chewing tobacco, or electronic cigarettes.  If you are pregnant, do not drink alcohol.  If you are breastfeeding, limit how much and how often you drink alcohol.  Limit alcohol intake to no more than 1 drink per day for  nonpregnant women. One drink equals 12 ounces of beer, 5 ounces of wine, or 1 ounces of hard liquor.  Do not use street drugs.  Do not share needles.  Ask your health care provider for help if you need support or information about quitting drugs.  Tell your health care provider if you often feel depressed.  Tell your health care provider if you have ever been abused or do not feel safe at home. Document Released: 03/02/2011 Document Revised: 01/01/2014 Document Reviewed: 07/19/2013 [facility] Patient Information 2015 [place], [place]. This information is not intended to replace advice given to you by your health care provider. Make sure  you discuss any questions you have with your health care provider.

## 2015-01-16 NOTE — Assessment & Plan Note (Signed)

## 2015-01-16 NOTE — Progress Notes (Signed)
Subjective:   Cindy Larson is a 69 y.o. female who presents for Medicare Annual (Subsequent) preventive examination.  Review of Systems:  HRA assessment completed during visit  Health better, the same or worse than last year? The same  Health described as excellent; very good; good; fair or poor? Good To be very good: getting out; socializing;  Barriers;   Current Exercise; work in the yard; or clean house;  Vacuum the house x 30 minutes x 3 times a week Would like to socialize more; but spouse likes her at home Discussed options to socialize;  Goal; silver sneakers will go 2 times a week/   Insurance: does pay silver sneaker;  Will walking plan where she can walk  Dietician referral; n/a 1-10 stress;  5; What would help getting out walk;   Current dietary; Loves to eat; not a picky eater; Likes to try different things;  Diabetes; cereal and fruit; lunch or inbetween;  Supper eats a meal Doesn't usually eat a lot of junk  BS: checks qd and runs 197; normally 127 - 140  Low sugar get shaky; eat something;    Vision had eye exam in this year; Last Feb;  Negative glaucoma in Right eye; treated with eye drops this year  Dental: Went to the dentist last month Generalized Safety in the home reviewed  Falls; she did fall cleaning around brush; and ran from yellow jacket  Big knot on forehead; black eye; very sore;   Gave information on safety to take home;   Current Care Team reviewed and updated Dr. Tora Kindred: eye doctor Dr. Burt Knack is the dentist  Cardiac Risk Factors include: advanced age (>54men, >65 women);diabetes mellitus;dyslipidemia;hypertension;obesity (BMI >30kg/m2)     Objective:     Vitals: BP 126/84 mmHg  Ht 4\' 11"  (1.499 m)  Wt 175 lb (79.379 kg)  BMI 35.33 kg/m2  Tobacco History  Smoking status  . Never Smoker   Smokeless tobacco  . Never Used     Counseling given: Not Answered   Past Medical History  Diagnosis Date  .  DIABETES MELLITUS, TYPE II   . HYPERLIPIDEMIA   . OBESITY   . ANXIETY   . DEPRESSION   . HYPERTENSION   . ALLERGIC RHINITIS   . GERD   . HIATAL HERNIA   . DIVERTICULOSIS, COLON   . OSTEOARTHRITIS     L knee  . OSTEOPENIA   . INTERMITTENT VERTIGO   . COLONIC POLYPS, HX OF   . Esophageal stricture   . Hypersomnolence   . Post-menopausal     hormone replacement therapy   Past Surgical History  Procedure Laterality Date  . Abdominal hysterectomy    . Cesarean section    . Oophorectomy      one  . Rotator cuff repair      left  . Lysis of adhesions    . Joint replacement    . Cholecystectomy     Family History  Problem Relation Age of Onset  . Hypertension Mother   . Atrial fibrillation Mother   . Heart disease Mother     CHF  . Cancer Mother     Cancer in "thigh"  . Cancer Paternal Aunt     Colon Cancer  . Cancer Brother     Lung Cancer-smoker  . Diabetes Other     Aunt and uncle   History  Sexual Activity  . Sexual Activity: Not on file    Outpatient Encounter Prescriptions  as of 01/16/2015  Medication Sig  . ALPRAZolam (XANAX) 0.25 MG tablet TAKE ONE TABLET BY MOUTH TWICE DAILY AS NEEDED FOR ANXIETY (Patient not taking: Reported on 01/16/2015)  . atorvastatin (LIPITOR) 20 MG tablet Take 20 mg by mouth daily.  . Cholecalciferol (VITAMIN D) 2000 UNITS CAPS Take 2,000 Units by mouth daily.   . clarithromycin (BIAXIN) 250 MG tablet Take 1 tablet (250 mg total) by mouth 2 (two) times daily. (Patient not taking: Reported on 01/16/2015)  . cyclobenzaprine (FLEXERIL) 5 MG tablet Take 1 tablet (5 mg total) by mouth 3 (three) times daily as needed for muscle spasms.  Marland Kitchen estradiol (ESTRACE) 1 MG tablet Take 1 mg by mouth daily.  . furosemide (LASIX) 40 MG tablet Take 40 mg by mouth.  . gabapentin (NEURONTIN) 300 MG capsule Take 1 capsule (300 mg total) by mouth 3 (three) times daily. Start with 1 capsule at bedtime then advanced to one capsule twice a day and if need be  increased to 1 capsule 3 times a day  . glimepiride (AMARYL) 4 MG tablet Take 4 mg by mouth 2 (two) times daily.  Marland Kitchen glimepiride (AMARYL) 4 MG tablet TAKE ONE TABLET BY MOUTH TWICE DAILY  . lisinopril (PRINIVIL,ZESTRIL) 20 MG tablet Take 20 mg by mouth daily.  . meclizine (ANTIVERT) 25 MG tablet Take 25 mg by mouth every 6 (six) hours as needed for dizziness.  . metFORMIN (GLUCOPHAGE) 500 MG tablet TAKE TWO TABLETS BY MOUTH TWICE DAILY WITH A MEAL  . omeprazole (PRILOSEC) 20 MG capsule Take 20 mg by mouth 2 (two) times daily before a meal.  . ONE TOUCH ULTRA TEST test strip USE AS DIRECTED ONE EVERY DAY  . ONETOUCH DELICA LANCETS 99991111 MISC USE AS DIRECTED ONE EVERY DAY  . PARoxetine (PAXIL) 20 MG tablet Take 20 mg by mouth daily.  . traMADol (ULTRAM) 50 MG tablet Take 1 tablet (50 mg total) by mouth every 6 (six) hours as needed.  . zolpidem (AMBIEN) 5 MG tablet Take 1 tablet (5 mg total) by mouth at bedtime as needed for sleep. (Patient not taking: Reported on 01/16/2015)   No facility-administered encounter medications on file as of 01/16/2015.    Activities of Daily Living In your present state of health, do you have any difficulty performing the following activities: 01/16/2015 06/15/2014  Hearing? N N  Vision? N N  Difficulty concentrating or making decisions? N N  Walking or climbing stairs? N N  Dressing or bathing? N N  Doing errands, shopping? N N  Preparing Food and eating ? N -  Using the Toilet? N -  In the past six months, have you accidently leaked urine? N -  Do you have problems with loss of bowel control? N -  Managing your Medications? N -  Managing your Finances? N -  Housekeeping or managing your Housekeeping? N -    Patient Care Team: Biagio Borg, MD as PCP - General Irene Shipper, MD as Consulting Physician (Gastroenterology) Avon Gully, NP as Nurse Practitioner (Obstetrics and Gynecology)    Assessment:     BMI: Reviewed BMI and given information on  BMI; Goal related exercise and continued good control of DM  Understand risk and lifestyle choices than can impede health: (CVD; Metabolic syndrome; functional decline;   Personalized Education given regarding:   Pt determined a personalized goal  To exercise and socialized while doing so  Assessment included: smoking (secondary) educated as appropriate; including LDCT if appropriate Patient  is a non smoker; she is not around smoke Bone density scan as appropriate/ will order today Calcium and Vit D as appropriate/ Osteoporosis risk reviewed/ Taking meds without issues; no barriers identified/  Stress: Recommendations for managing stress if assessed as a factor;  No Risk for hepatitis or high risk social behavior identified via hepatitis screen Educated on shingles and follow up with insurance company for co-pays or charges applied to Part D benefit.  Safety issues reviewed(driving issues; vision; home environment; support and environmental safety)  Cognition assessed by AD8; Score 0 (A score of 2 or greater would indicate the MMSE be completed)    Need for Immunizations or other screenings identified;  (CDC recommmend Prevnar at 65 followed by pnuemovax 23 in one year or 5 years after the last dose.  Health Maintenance up to date and a Preventive Wellness Plan was given to the patient    Exercise Activities and Dietary recommendations Current Exercise Habits:: Home exercise routine, Type of exercise: walking, Time (Minutes): 30, Frequency (Times/Week): 3, Weekly Exercise (Minutes/Week): 90, Intensity: Moderate  Goals    . Prevent Falls     Hx 3 falls in the last year;  (one due to bee stings; the other on concrete) One she slipped when wet and missed step; and tripped on rug in home Will start exercise at silver sneakers; and walking; go slow when raining;  Recommend strength training and balance / discussed exercises and agrees to attempt [employer] x 2 and walking at least 2  times a week        Fall Risk Fall Risk  01/16/2015 01/16/2015 12/14/2013  Falls in the past year? Yes Yes No  Number falls in past yr: 2 or more 2 or more -  Injury with Fall? No - -   Depression Screen PHQ 2/9 Scores 01/16/2015 01/16/2015 12/14/2013  PHQ - 2 Score 0 1 0     Cognitive Testing MMSE - Mini Mental State Exam 01/16/2015  Not completed: Unable to complete    Immunization History  Administered Date(s) Administered  . Influenza Split 06/29/2012  . Influenza Whole 07/18/2003, 06/22/2008  . Influenza, High Dose Seasonal PF 06/15/2013  . Influenza,inj,Quad PF,36+ Mos 06/15/2014  . Pneumococcal Conjugate-13 06/29/2013  . Pneumococcal Polysaccharide-23 08/17/2005, 10/24/2010  . Td 10/24/2010   Screening Tests Health Maintenance  Topic Date Due  . OPHTHALMOLOGY EXAM  05/27/1956  . ZOSTAVAX  05/27/2006  . DEXA SCAN  05/28/2011  . FOOT EXAM  12/15/2014  . HEMOGLOBIN A1C  12/15/2014  . URINE MICROALBUMIN  12/15/2014  . INFLUENZA VACCINE  04/01/2015  . COLONOSCOPY  05/20/2015  . PNA vac Low Risk Adult (2 of 2 - PPSV23) 10/25/2015  . MAMMOGRAM  09/04/2016  . TETANUS/TDAP  10/24/2020      Plan:   Plan   The patient agrees to: Attempt to go to silver sneakers x 2 q weeks Reviewed safety infor Also, may attempt to walk a couple of times a week;  Advanced directive: completed and will bring a copy   During the course of the visit the patient was educated and counseled about the following appropriate screening and preventive services:   Vaccines to include Pneumoccal, Influenza, Hepatitis B, Td, Zostavax, HCV/ shingles cost prohibitive now ; will check benefit for part d   Electrocardiogram/ deferred  Cardiovascular Disease; discussed  Colorectal cancer screening; Referred to Dr. [name];   Bone density screening; to be ordered and scheduled this visit  Diabetes screening; drawing labs today  Glaucoma screening:completed; dx glaucoma in right  eye  Mammography/PAP; mammogram completed; no pap any more  Nutrition counseling   Patient Instructions (the written plan) was given to the patient.   Wynetta Fines, RN  01/16/2015   Medical screening examination/treatment/procedure(s) were performed by non-physician practitioner and as supervising physician I was immediately available for consultation/collaboration. I agree with above. Cathlean Cower, MD  Please see separate note for acute visit same day as wellness.  Cathlean Cower MD

## 2015-01-16 NOTE — Patient Instructions (Signed)

## 2015-01-21 ENCOUNTER — Ambulatory Visit (INDEPENDENT_AMBULATORY_CARE_PROVIDER_SITE_OTHER)
Admission: RE | Admit: 2015-01-21 | Discharge: 2015-01-21 | Disposition: A | Discharge status: Home / Self Care | Payer: Medicare Other | Source: Ambulatory Visit | Attending: Internal Medicine | Admitting: Internal Medicine

## 2015-01-21 DIAGNOSIS — M8580 Other specified disorders of bone density and structure, unspecified site: Secondary | ICD-10-CM

## 2015-01-23 NOTE — Addendum Note (Signed)
Addended by: Biagio Borg on: 01/23/2015 03:09 PM   Modules accepted: Miquel Dunn

## 2015-01-24 DIAGNOSIS — Z0279 Encounter for issue of other medical certificate: Secondary | ICD-10-CM

## 2015-02-22 ENCOUNTER — Other Ambulatory Visit: Payer: Self-pay | Admitting: Internal Medicine

## 2015-04-10 ENCOUNTER — Other Ambulatory Visit: Payer: Self-pay | Admitting: Internal Medicine

## 2015-04-18 ENCOUNTER — Ambulatory Visit (INDEPENDENT_AMBULATORY_CARE_PROVIDER_SITE_OTHER): Payer: Medicare Other | Admitting: Internal Medicine

## 2015-04-18 ENCOUNTER — Encounter: Payer: Self-pay | Admitting: Internal Medicine

## 2015-04-18 ENCOUNTER — Ambulatory Visit (INDEPENDENT_AMBULATORY_CARE_PROVIDER_SITE_OTHER)
Admission: RE | Admit: 2015-04-18 | Discharge: 2015-04-18 | Disposition: A | Discharge status: Home / Self Care | Payer: Medicare Other | Source: Ambulatory Visit | Attending: Internal Medicine | Admitting: Internal Medicine

## 2015-04-18 VITALS — BP 116/76 | HR 96 | Temp 97.5°F | Ht 59.0 in | Wt 173.0 lb

## 2015-04-18 DIAGNOSIS — E119 Type 2 diabetes mellitus without complications: Secondary | ICD-10-CM | POA: Diagnosis not present

## 2015-04-18 DIAGNOSIS — R079 Chest pain, unspecified: Secondary | ICD-10-CM | POA: Insufficient documentation

## 2015-04-18 DIAGNOSIS — I10 Essential (primary) hypertension: Secondary | ICD-10-CM

## 2015-04-18 MED ORDER — RANITIDINE HCL 300 MG PO TABS: 300.0000 mg | ORAL_TABLET | Freq: Every day | ORAL | Status: DC

## 2015-04-18 MED ORDER — ASPIRIN EC 81 MG PO TBEC: 81.0000 mg | DELAYED_RELEASE_TABLET | Freq: Every day | ORAL | Status: DC

## 2015-04-18 NOTE — Assessment & Plan Note (Signed)
stable overall by history and exam, recent data reviewed with pt, and pt to continue medical treatment as before,  to f/u any worsening symptoms or concerns BP Readings from Last 3 Encounters:  04/18/15 116/76  01/16/15 126/84  01/16/15 126/84

## 2015-04-18 NOTE — Progress Notes (Signed)
Subjective:    Patient ID: Cindy Larson, female    DOB: 06/01/1946, 69 y.o.   MRN: [id number]  HPI  Here with onset SSCP x 2-3 mo, dull and sometimes pressure like, intermittent, no radiation, nonexertoinal, nonpositinal, no assoc sob, diaphoresis, vomiting, dizziness but has occas nausea and palp's.  Last stress test 2002, no current pain now.  States taking extra omeprazole might help, nothing else seems to help or make worse.  No fever, ST, cough.  No prior of heart disesdr. + CRF's with DM, HTN,, HLD, and FH.  Not currently taking the asa 81.  Is taking the PPI - 40 qd omeprazole Past Medical History  Diagnosis Date  . DIABETES MELLITUS, TYPE II   . HYPERLIPIDEMIA   . OBESITY   . ANXIETY   . DEPRESSION   . HYPERTENSION   . ALLERGIC RHINITIS   . GERD   . HIATAL HERNIA   . DIVERTICULOSIS, COLON   . OSTEOARTHRITIS     L knee  . OSTEOPENIA   . INTERMITTENT VERTIGO   . COLONIC POLYPS, HX OF   . Esophageal stricture   . Hypersomnolence   . Post-menopausal     hormone replacement therapy   Past Surgical History  Procedure Laterality Date  . Abdominal hysterectomy    . Cesarean section    . Oophorectomy      one  . Rotator cuff repair      left  . Lysis of adhesions    . Joint replacement    . Cholecystectomy      reports that she has never smoked. She has never used smokeless tobacco. She reports that she does not drink alcohol or use illicit drugs. family history includes Atrial fibrillation in her mother; Cancer in her brother, mother, and paternal aunt; Diabetes in her other; Heart disease in her mother; Hypertension in her mother. Allergies  Allergen Reactions  . Aspirin Other (See Comments)    Stomach hurts  . Penicillins Hives    blisters  . Prevnar [Pneumococcal 13-Val Conj Vacc] Swelling    Left arm swelling  . Simvastatin     REACTION: itch   Current Outpatient Prescriptions on File Prior to Visit  Medication Sig Dispense Refill  . atorvastatin  (LIPITOR) 20 MG tablet Take 20 mg by mouth daily.    . Cholecalciferol (VITAMIN D) 2000 UNITS CAPS Take 2,000 Units by mouth daily.     . cyclobenzaprine (FLEXERIL) 5 MG tablet Take 1 tablet (5 mg total) by mouth 3 (three) times daily as needed for muscle spasms. 60 tablet 1  . estradiol (ESTRACE) 1 MG tablet Take 1 mg by mouth daily.    . furosemide (LASIX) 40 MG tablet Take 40 mg by mouth.    . gabapentin (NEURONTIN) 300 MG capsule Take 1 capsule (300 mg total) by mouth 3 (three) times daily. Start with 1 capsule at bedtime then advanced to one capsule twice a day and if need be increased to 1 capsule 3 times a day 90 capsule 5  . glimepiride (AMARYL) 4 MG tablet Take 4 mg by mouth 2 (two) times daily.    Marland Kitchen lisinopril (PRINIVIL,ZESTRIL) 20 MG tablet Take 20 mg by mouth daily.    . meclizine (ANTIVERT) 25 MG tablet Take 25 mg by mouth every 6 (six) hours as needed for dizziness.    . metFORMIN (GLUCOPHAGE) 500 MG tablet TAKE TWO TABLETS BY MOUTH TWICE DAILY WITH A MEAL 360 tablet 0  . omeprazole (  PRILOSEC) 20 MG capsule Take 20 mg by mouth 2 (two) times daily before a meal.    . ONE TOUCH ULTRA TEST test strip USE AS DIRECTED ONE EVERY DAY 100 each 0  . ONETOUCH DELICA LANCETS 99991111 MISC USE AS DIRECTED ONE EVERY DAY ( YEARLY PHYSICAL IS DUE IN APRIL MUST SEE MD FOR FUTURE REFILLS) 100 each 1  . PARoxetine (PAXIL) 20 MG tablet Take 20 mg by mouth daily.    . traMADol (ULTRAM) 50 MG tablet Take 1 tablet (50 mg total) by mouth every 6 (six) hours as needed. 60 tablet 1  . ALPRAZolam (XANAX) 0.25 MG tablet TAKE ONE TABLET BY MOUTH TWICE DAILY AS NEEDED FOR ANXIETY (Patient not taking: Reported on 01/16/2015) 60 tablet 2  . clarithromycin (BIAXIN) 250 MG tablet Take 1 tablet (250 mg total) by mouth 2 (two) times daily. (Patient not taking: Reported on 01/16/2015) 20 tablet 0  . zolpidem (AMBIEN) 5 MG tablet Take 1 tablet (5 mg total) by mouth at bedtime as needed for sleep. (Patient not taking: Reported on  01/16/2015) 30 tablet 0   No current facility-administered medications on file prior to visit.   Review of Systems  Constitutional: Negative for unusual diaphoresis or night sweats HENT: Negative for ringing in ear or discharge Eyes: Negative for double vision or worsening visual disturbance.  Respiratory: Negative for choking and stridor.   Gastrointestinal: Negative for vomiting or other signifcant bowel change Genitourinary: Negative for hematuria or change in urine volume.  Musculoskeletal: Negative for other MSK pain or swelling Skin: Negative for color change and worsening wound.  Neurological: Negative for tremors and numbness other than noted  Psychiatric/Behavioral: Negative for decreased concentration or agitation other than above       Objective:   Physical Exam BP 116/76 mmHg  Pulse 96  Temp(Src) 97.5 F (36.4 C) (Oral)  Ht 4\' 11"  (1.499 m)  Wt 173 lb (78.472 kg)  BMI 34.92 kg/m2  SpO2 98% VS noted,  Constitutional: Pt appears in no significant distress HENT: Head: NCAT.  Right Ear: External ear normal.  Left Ear: External ear normal.  Eyes: . Pupils are equal, round, and reactive to light. Conjunctivae and EOM are normal Neck: Normal range of motion. Neck supple.  Cardiovascular: Normal rate and regular rhythm.   Pulmonary/Chest: Effort normal and breath sounds without rales or wheezing.  Abd:  Soft, NT, ND, + BS except for mild epigsatric tender Neurological: Pt is alert. Not confused , motor grossly intact Skin: Skin is warm. No rash, no LE edema Psychiatric: Pt behavior is normal. No agitation.     Assessment & Plan:

## 2015-04-18 NOTE — Assessment & Plan Note (Signed)
stable overall by history and exam, recent data reviewed with pt, and pt to continue medical treatment as before,  to f/u any worsening symptoms or concerns Lab Results  Component Value Date   HGBA1C 7.1* 01/16/2015

## 2015-04-18 NOTE — Progress Notes (Signed)
Pre visit review using our clinic review tool, if applicable. No additional management support is needed unless otherwise documented below in the visit note. 

## 2015-04-18 NOTE — Patient Instructions (Signed)
Please take all new medication as prescribed  - the zantac at bedtime  Please re-start the aspirin 81 mg (enteric coated only) - OTC - 1 per day  Please continue all other medications as before, and refills have been done if requested.  Please have the pharmacy call with any other refills you may need.  Please keep your appointments with your specialists as you may have planned  Your EKG was OK today  Please go to the XRAY Department in the Basement (go straight as you get off the elevator) for the x-ray testing  You will be contacted regarding the referral for: stress test

## 2015-04-18 NOTE — Assessment & Plan Note (Signed)
Atypical, ? GI but cant r/o cardiac, for ecg, cxr, stress test, re-start asa, and qhs zantac  ECG reviewed as per emr

## 2015-05-21 ENCOUNTER — Other Ambulatory Visit: Payer: Self-pay | Admitting: Internal Medicine

## 2015-06-25 ENCOUNTER — Ambulatory Visit (INDEPENDENT_AMBULATORY_CARE_PROVIDER_SITE_OTHER): Payer: Medicare Other

## 2015-06-25 DIAGNOSIS — Z23 Encounter for immunization: Secondary | ICD-10-CM | POA: Diagnosis not present

## 2015-07-22 ENCOUNTER — Other Ambulatory Visit: Payer: Self-pay | Admitting: Internal Medicine

## 2015-07-23 NOTE — Telephone Encounter (Signed)
Done Done hardcopy to Dahlia  

## 2015-07-23 NOTE — Telephone Encounter (Signed)
Rx faxed to pharmacy  

## 2015-07-30 ENCOUNTER — Telehealth (HOSPITAL_COMMUNITY): Payer: Self-pay | Admitting: *Deleted

## 2015-07-30 NOTE — Telephone Encounter (Signed)
Patient given detailed instructions per Myocardial Perfusion Study Information Sheet for the test on 08/01/15 at 0730. Patient notified to arrive 15 minutes early and that it is imperative to arrive on time for appointment to keep from having the test rescheduled.  If you need to cancel or reschedule your appointment, please call the office within 24 hours of your appointment. Failure to do so may result in a cancellation of your appointment, and a $50 no show fee. Patient verbalized understanding.Piers Baade, Ranae Palms

## 2015-08-01 ENCOUNTER — Ambulatory Visit (HOSPITAL_COMMUNITY): Payer: Medicare Other | Attending: Internal Medicine

## 2015-08-01 DIAGNOSIS — I10 Essential (primary) hypertension: Secondary | ICD-10-CM | POA: Diagnosis not present

## 2015-08-01 DIAGNOSIS — E119 Type 2 diabetes mellitus without complications: Secondary | ICD-10-CM | POA: Diagnosis not present

## 2015-08-01 DIAGNOSIS — R079 Chest pain, unspecified: Secondary | ICD-10-CM | POA: Diagnosis not present

## 2015-08-01 LAB — MYOCARDIAL PERFUSION IMAGING
CHL CUP NUCLEAR SRS: 0
CHL CUP NUCLEAR SSS: 5
LV sys vol: 15 mL
LVDIAVOL: 48 mL
NUC STRESS TID: 0.95
Peak HR: 102 {beats}/min
RATE: 0.32
Rest HR: 82 {beats}/min
SDS: 5

## 2015-08-01 MED ORDER — TECHNETIUM TC 99M SESTAMIBI GENERIC - CARDIOLITE
10.6000 | Freq: Once | INTRAVENOUS | Status: AC | PRN
  Administered 2015-08-01: 11 via INTRAVENOUS

## 2015-08-01 MED ORDER — TECHNETIUM TC 99M SESTAMIBI GENERIC - CARDIOLITE
32.7000 | Freq: Once | INTRAVENOUS | Status: AC | PRN
  Administered 2015-08-01: 32.7 via INTRAVENOUS

## 2015-08-01 MED ORDER — REGADENOSON 0.4 MG/5ML IV SOLN
0.4000 mg | Freq: Once | INTRAVENOUS | Status: AC
  Administered 2015-08-01: 0.4 mg via INTRAVENOUS

## 2015-08-14 ENCOUNTER — Other Ambulatory Visit: Payer: Self-pay | Admitting: Internal Medicine

## 2015-08-21 ENCOUNTER — Other Ambulatory Visit: Payer: Self-pay

## 2015-08-21 DIAGNOSIS — Z1231 Encounter for screening mammogram for malignant neoplasm of breast: Secondary | ICD-10-CM

## 2015-09-19 ENCOUNTER — Ambulatory Visit
Admission: RE | Admit: 2015-09-19 | Discharge: 2015-09-19 | Disposition: A | Discharge status: Home / Self Care | Payer: Medicare Other | Source: Ambulatory Visit

## 2015-09-19 DIAGNOSIS — Z1231 Encounter for screening mammogram for malignant neoplasm of breast: Secondary | ICD-10-CM

## 2015-10-07 ENCOUNTER — Telehealth: Payer: Self-pay | Admitting: Gastroenterology

## 2015-10-07 ENCOUNTER — Telehealth: Payer: Self-pay | Admitting: Internal Medicine

## 2015-10-07 ENCOUNTER — Other Ambulatory Visit (INDEPENDENT_AMBULATORY_CARE_PROVIDER_SITE_OTHER): Payer: Medicare Other

## 2015-10-07 ENCOUNTER — Encounter: Payer: Self-pay | Admitting: Internal Medicine

## 2015-10-07 DIAGNOSIS — R1032 Left lower quadrant pain: Secondary | ICD-10-CM

## 2015-10-07 LAB — CBC WITH DIFFERENTIAL/PLATELET
BASOS PCT: 0.5 % (ref 0.0–3.0)
Basophils Absolute: 0 10*3/uL (ref 0.0–0.1)
EOS PCT: 2.7 % (ref 0.0–5.0)
Eosinophils Absolute: 0.2 10*3/uL (ref 0.0–0.7)
HCT: 40 % (ref 36.0–46.0)
HEMOGLOBIN: 13 g/dL (ref 12.0–15.0)
Lymphocytes Relative: 48.3 % — ABNORMAL HIGH (ref 12.0–46.0)
Lymphs Abs: 3.5 10*3/uL (ref 0.7–4.0)
MCHC: 32.5 g/dL (ref 30.0–36.0)
MCV: 86.9 fl (ref 78.0–100.0)
MONO ABS: 0.6 10*3/uL (ref 0.1–1.0)
Monocytes Relative: 8 % (ref 3.0–12.0)
NEUTROS ABS: 3 10*3/uL (ref 1.4–7.7)
Neutrophils Relative %: 40.5 % — ABNORMAL LOW (ref 43.0–77.0)
PLATELETS: 371 10*3/uL (ref 150.0–400.0)
RBC: 4.6 Mil/uL (ref 3.87–5.11)
RDW: 14.2 % (ref 11.5–15.5)
WBC: 7.3 10*3/uL (ref 4.0–10.5)

## 2015-10-07 MED ORDER — METRONIDAZOLE 500 MG PO TABS
ORAL_TABLET | ORAL | Status: DC
Start: 2015-10-07 — End: 2015-11-27

## 2015-10-07 MED ORDER — CIPROFLOXACIN HCL 500 MG PO TABS: ORAL_TABLET | ORAL | Status: DC

## 2015-10-07 NOTE — Telephone Encounter (Signed)
She needs a cbc today or tomorrow.  Lets start her on cipro 500mg  one pill twice daily, flagyl 500mg  one pill tid.  Both for 10 days.  Please have her call back in 3-4 days to report on her progress, sooner if she feels worse.  rov with [name] next available.

## 2015-10-07 NOTE — Telephone Encounter (Signed)
She needs CBC today or tomorrow and appt with

## 2015-10-07 NOTE — Telephone Encounter (Signed)
Patient notified of recommendations. Lab in [facility]. Rx's sent to pharmacy. Patient has OV with Dr. [name] already scheduled.

## 2015-10-07 NOTE — Telephone Encounter (Signed)
Dr. [name] patient(he is off) that had diverticulitis confirmed by CT in 2014. She is calling to report LLQ pain since last Tuesday that is getting worse. States she has had some constipation, nausea and chills. She has not taken her temperature. No extender appointments this week. DOD- please, advise.

## 2015-10-07 NOTE — Telephone Encounter (Signed)
Spoke with [place] and corrected the # for Flagyl to 30 tablets.

## 2015-10-31 DIAGNOSIS — M25511 Pain in right shoulder: Secondary | ICD-10-CM | POA: Diagnosis not present

## 2015-11-08 ENCOUNTER — Other Ambulatory Visit: Payer: Self-pay | Admitting: Internal Medicine

## 2015-11-14 ENCOUNTER — Other Ambulatory Visit: Payer: Self-pay | Admitting: Internal Medicine

## 2015-11-22 ENCOUNTER — Other Ambulatory Visit: Payer: Self-pay | Admitting: Internal Medicine

## 2015-11-27 ENCOUNTER — Ambulatory Visit (INDEPENDENT_AMBULATORY_CARE_PROVIDER_SITE_OTHER): Payer: Medicare Other | Admitting: Internal Medicine

## 2015-11-27 ENCOUNTER — Encounter: Payer: Self-pay | Admitting: Internal Medicine

## 2015-11-27 VITALS — BP 116/74 | HR 92 | Ht 58.75 in | Wt 169.4 lb

## 2015-11-27 DIAGNOSIS — K219 Gastro-esophageal reflux disease without esophagitis: Secondary | ICD-10-CM | POA: Diagnosis not present

## 2015-11-27 DIAGNOSIS — Z86010 Personal history of colonic polyps: Secondary | ICD-10-CM

## 2015-11-27 DIAGNOSIS — E119 Type 2 diabetes mellitus without complications: Secondary | ICD-10-CM | POA: Diagnosis not present

## 2015-11-27 DIAGNOSIS — K5732 Diverticulitis of large intestine without perforation or abscess without bleeding: Secondary | ICD-10-CM | POA: Diagnosis not present

## 2015-11-27 MED ORDER — NA SULFATE-K SULFATE-MG SULF 17.5-3.13-1.6 GM/177ML PO SOLN: 1.0000 | Freq: Once | ORAL | Status: DC

## 2015-11-27 NOTE — Patient Instructions (Signed)

## 2015-11-27 NOTE — Progress Notes (Signed)
HISTORY OF PRESENT ILLNESS:  Cindy Larson is a 70 y.o. female with a history of adenomatous colon polyps, diverticulitis, and GERD, Cindy Larson by peptic stricture requiring esophageal dilation. The patient has not been seen in this office in over 3 years. Last evaluation February 2014 for diverticulitis confirmed by CT and treated with Cipro and Flagyl. She presents today with a chief complaint of left lower quadrant pain. Problems began in January. Initially fluctuated but eventually worsened. She contacted the office and was empirically placed on Cipro and Flagyl and this appointment arranged. She has completed antibiotic therapy. She is pleased report that her abdominal discomfort has improved. Her bowel habits are regular. Her last colonoscopy was performed September 2011. Total small polyps removed. Follow-up in 5 years recommended. She did receive recall letter previously. Finally, she has a history of GERD. Currently taking omeprazole 20 or 40 mg daily. She has significant symptoms off medicine but no symptoms with medical compliance. She denies a recurrent dysphagia. She remains overweight. Her chronic medical problems include diabetes mellitus, hypertension, and hyperlipidemia. These have been stable  REVIEW OF SYSTEMS:  All non-GI ROS negative except for sinus and allergy, back pain, depression, itching, night sweats, ankle swelling, increased thirst  Past Medical History  Diagnosis Date  . DIABETES MELLITUS, TYPE II   . HYPERLIPIDEMIA   . OBESITY   . ANXIETY   . DEPRESSION   . HYPERTENSION   . ALLERGIC RHINITIS   . GERD   . HIATAL HERNIA   . DIVERTICULOSIS, COLON   . OSTEOARTHRITIS     L knee  . OSTEOPENIA   . INTERMITTENT VERTIGO   . COLONIC POLYPS, HX OF   . Esophageal stricture   . Hypersomnolence   . Post-menopausal     hormone replacement therapy  . Diverticulitis   . Gallstones   . Pneumonia     Past Surgical History  Procedure Laterality Date  . Abdominal  hysterectomy    . Cesarean section    . Oophorectomy      one  . Rotator cuff repair Left   . Lysis of adhesions    . Total knee arthroplasty Left   . Cholecystectomy      Social History Cindy Larson  reports that she has never smoked. She has never used smokeless tobacco. She reports that she does not drink alcohol or use illicit drugs.  family history includes Atrial fibrillation in her mother; Breast cancer in her maternal grandmother; Cancer in her mother; Colon cancer in her paternal aunt; Diabetes in her other; Heart disease in her mother; Heart failure in her brother; Hypertension in her mother; Kidney disease in her maternal aunt; Lung cancer in her brother; Ovarian cancer in her sister.  Allergies  Allergen Reactions  . Aspirin Other (See Comments)    Stomach hurts  . Penicillins Hives    blisters  . Prevnar [Pneumococcal 13-Val Conj Vacc] Swelling    Left arm swelling  . Zocor [Simvastatin]     REACTION: itch       PHYSICAL EXAMINATION: Vital signs: BP 116/74 mmHg  Pulse 92  Ht 4' 10.75" (1.492 m)  Wt 169 lb 6 oz (76.828 kg)  BMI 34.51 kg/m2  Constitutional: Pleasant, obese, generally well-appearing, no acute distress Psychiatric: alert and oriented x3, cooperative Eyes: extraocular movements intact, anicteric, conjunctiva pink Mouth: oral pharynx moist, no lesions Neck: supple no lymphadenopathy Cardiovascular: heart regular rate and rhythm, no murmur Lungs: clear to auscultation bilaterally Abdomen: soft, obese, nontender,  nondistended, no obvious ascites, no peritoneal signs, normal bowel sounds, no organomegaly Rectal: Deferred until colonoscopy Extremities: no clubbing, cyanosis, or lower extremity edema bilaterally Skin: no lesions on visible extremities Neuro: No focal deficits. Normal DTRs. No asterixis.    ASSESSMENT:  1. Acute left lower quadrant pain consistent with acute diverticulitis. Improved after appropriate medical therapy.  Previous history of documented diverticulitis February 2014 2. History of adenomatous colon polyps. Previous colonoscopies 2002, 2005 and 2011. Due for surveillance 3. GERD, complicated by peptic stricture. Requires PPI for control. No recurrent dysphagia post-dilation 4. Multiple medical problems including diabetes    PLAN:  1. Discussion on diverticulitis. Recommend high-fiber diet 2. Schedule surveillance colonoscopy.The nature of the procedure, as well as the risks, benefits, and alternatives were carefully and thoroughly reviewed with the patient. Ample time for discussion and questions allowed. The patient understood, was satisfied, and agreed to proceed. 3. Reflux precautions with attention to weight loss 4. Lowest dose of PPI to control reflux symptoms 5. Repeat esophageal dilation for recurrent dysphagia. None at present 6. Will need to hold diabetic medications the day of her procedure to avoid hypoglycemia 7. Ongoing general medical care with PCP

## 2016-01-01 ENCOUNTER — Encounter: Payer: Self-pay | Admitting: Internal Medicine

## 2016-01-01 ENCOUNTER — Ambulatory Visit (AMBULATORY_SURGERY_CENTER): Payer: Medicare Other | Admitting: Internal Medicine

## 2016-01-01 VITALS — BP 121/79 | HR 84 | Resp 11 | Ht <= 58 in | Wt 169.0 lb

## 2016-01-01 DIAGNOSIS — D122 Benign neoplasm of ascending colon: Secondary | ICD-10-CM | POA: Diagnosis not present

## 2016-01-01 DIAGNOSIS — D123 Benign neoplasm of transverse colon: Secondary | ICD-10-CM

## 2016-01-01 DIAGNOSIS — Z86010 Personal history of colonic polyps: Secondary | ICD-10-CM | POA: Diagnosis not present

## 2016-01-01 LAB — GLUCOSE, CAPILLARY
Glucose-Capillary: 166 mg/dL — ABNORMAL HIGH (ref 65–99)
Glucose-Capillary: 131 mg/dL — ABNORMAL HIGH (ref 65–99)

## 2016-01-01 MED ORDER — SODIUM CHLORIDE 0.9 % IV SOLN: 500.0000 mL | INTRAVENOUS | Status: DC

## 2016-01-01 NOTE — Patient Instructions (Signed)

## 2016-01-01 NOTE — Progress Notes (Signed)
Called to room to assist during endoscopic procedure.  Patient ID and intended procedure confirmed with present staff. Received instructions for my participation in the procedure from the performing physician.  

## 2016-01-01 NOTE — Progress Notes (Signed)
A and Ox 3 Report to RN 

## 2016-01-01 NOTE — Op Note (Signed)
[facility] Patient Name: Cindy Larson Procedure Date: 01/01/2016 12:56 PM MRN: [id number] Endoscopist: Docia Chuck. [name] , MD Age: 70 Date of Birth: Feb 24, 1946 Gender: Female Procedure:                Colonoscopy, with snare polypectomy and EMR with                            submucosal Ink tattoo Indications:              Surveillance: Personal history of adenomatous                            polyps on last colonoscopy > 5 years ago Medicines:                Monitored Anesthesia Care Procedure:                Pre-Anesthesia Assessment:                           - Prior to the procedure, a History and Physical                            was performed, and patient medications and                            allergies were reviewed. The patient's tolerance of                            previous anesthesia was also reviewed. The risks                            and benefits of the procedure and the sedation                            options and risks were discussed with the patient.                            All questions were answered, and informed consent                            was obtained. Prior Anticoagulants: The patient has                            taken no previous anticoagulant or antiplatelet                            agents. ASA Grade Assessment: II - A patient with                            mild systemic disease. After reviewing the risks                            and benefits, the patient was deemed in  satisfactory condition to undergo the procedure.                           After obtaining informed consent, the colonoscope                            was passed under direct vision. Throughout the                            procedure, the patient's blood pressure, pulse, and                            oxygen saturations were monitored continuously. The                            Model CF-HQ190L [id number]) scope was introduced                             through the anus and advanced to the the cecum,                            identified by appendiceal orifice and ileocecal                            valve. The ileocecal valve, appendiceal orifice,                            and rectum were photographed. The quality of the                            bowel preparation was excellent. The colonoscopy                            was performed without difficulty. The patient                            tolerated the procedure well. The bowel preparation                            used was SUPREP. Scope In: 1:03:40 PM Scope Out: 1:28:56 PM Scope Withdrawal Time: 0 hours 19 minutes 1 second  Total Procedure Duration: 0 hours 25 minutes 16 seconds  Findings:                 Three polyps were found in the proximal transverse                            colon (15 mm sessile) and ascending colon (3 mm, 5                            mm). These polyps were removed with a cold snare.                            Largest polyp was raised with normal saline,  removed piecemeal, and subsequently marked with                            distal ink tattoo. Resection and retrieval were                            complete.                           Multiple diverticula were found in the left colon                            and right colon. There was fixed narrowing at the                            rectosigmoid junction.                           The exam was otherwise without abnormality on                            direct and retroflexion views. Complications:            No immediate complications. Estimated blood loss:                            None. Estimated Blood Loss:     Estimated blood loss: none. Impression:               - Three polyps in the proximal transverse colon and                            in the ascending colon, removed with a cold snare.                            See above.                            - Diverticulosis in the left colon and in the right                            colon.                           - The examination was otherwise normal on direct                            and retroflexion views. Recommendation:           - Repeat colonoscopy in 1 year for surveillance                            after piecemeal polypectomy. Pediatric colonoscope.                           - Continue present medications.                           -  Await pathology results. Docia Chuck. [name], MD 01/01/2016 1:42:11 PM This report has been signed electronically. CC Letter to:             Biagio Borg

## 2016-01-02 ENCOUNTER — Telehealth: Payer: Self-pay | Admitting: *Deleted

## 2016-01-02 NOTE — Telephone Encounter (Signed)
  Follow up Call-  Call back number 01/01/2016  Post procedure Call Back phone  # [phone]  Permission to leave phone message Yes     Patient questions:  Do you have a fever, pain , or abdominal swelling? Yes.   Pain Score  6 *  Have you tolerated food without any problems? Yes.    Have you been able to return to your normal activities? Yes.    Do you have any questions about your discharge instructions: Diet   No. Medications  No. Follow up visit  No.  Do you have questions or concerns about your Care? No.  Actions: * If pain score is 4 or above: Physician/ provider Notified : Scarlette Shorts, MD   Dr. [name], Ms Sheppard Coil c/o right sided abdominal pain, rating as a "6."  She has been able to eat without nausea and has had a bowel movement. States abd feels soft.  She asked if ok to take Tylenol.  I told her this was ok and to ambulate today to see if maybe there is some trapped air that is causing pain.  Is there anything you would like me to tell her?  Thanks, [employer]

## 2016-01-02 NOTE — Telephone Encounter (Signed)
Pt rates abd pain as a "1" now.  States she is feeling "much better."

## 2016-01-02 NOTE — Telephone Encounter (Signed)
Nothing additional. She looked good post procedure without complaints. If you would, please check on her this afternoon. If she were to get worse, I'd recommend ER evaluation. Thanks

## 2016-01-08 ENCOUNTER — Encounter: Payer: Self-pay | Admitting: Internal Medicine

## 2016-01-31 ENCOUNTER — Other Ambulatory Visit (INDEPENDENT_AMBULATORY_CARE_PROVIDER_SITE_OTHER): Payer: Medicare Other

## 2016-01-31 ENCOUNTER — Encounter: Payer: Self-pay | Admitting: Internal Medicine

## 2016-01-31 ENCOUNTER — Ambulatory Visit (INDEPENDENT_AMBULATORY_CARE_PROVIDER_SITE_OTHER): Payer: Medicare Other | Admitting: Internal Medicine

## 2016-01-31 VITALS — BP 130/68 | HR 95 | Temp 98.0°F | Resp 20 | Wt 167.0 lb

## 2016-01-31 DIAGNOSIS — I10 Essential (primary) hypertension: Secondary | ICD-10-CM

## 2016-01-31 DIAGNOSIS — E119 Type 2 diabetes mellitus without complications: Secondary | ICD-10-CM | POA: Diagnosis not present

## 2016-01-31 DIAGNOSIS — M25511 Pain in right shoulder: Secondary | ICD-10-CM

## 2016-01-31 DIAGNOSIS — E785 Hyperlipidemia, unspecified: Secondary | ICD-10-CM | POA: Diagnosis not present

## 2016-01-31 DIAGNOSIS — M25512 Pain in left shoulder: Secondary | ICD-10-CM

## 2016-01-31 DIAGNOSIS — Z0001 Encounter for general adult medical examination with abnormal findings: Secondary | ICD-10-CM

## 2016-01-31 DIAGNOSIS — Z1159 Encounter for screening for other viral diseases: Secondary | ICD-10-CM | POA: Diagnosis not present

## 2016-01-31 DIAGNOSIS — R6889 Other general symptoms and signs: Secondary | ICD-10-CM

## 2016-01-31 LAB — URINALYSIS, ROUTINE W REFLEX MICROSCOPIC
BILIRUBIN URINE: NEGATIVE
Hgb urine dipstick: NEGATIVE
LEUKOCYTES UA: NEGATIVE
Nitrite: NEGATIVE
PH: 5.5 (ref 5.0–8.0)
RBC / HPF: NONE SEEN (ref 0–?)
Total Protein, Urine: NEGATIVE
Urine Glucose: NEGATIVE
Urobilinogen, UA: 0.2 (ref 0.0–1.0)
WBC, UA: NONE SEEN (ref 0–?)

## 2016-01-31 LAB — MICROALBUMIN / CREATININE URINE RATIO
Creatinine,U: 261.2 mg/dL
Microalb Creat Ratio: 0.7 mg/g (ref 0.0–30.0)
Microalb, Ur: 1.7 mg/dL (ref 0.0–1.9)

## 2016-01-31 LAB — CBC WITH DIFFERENTIAL/PLATELET
Basophils Absolute: 0 10*3/uL (ref 0.0–0.1)
Basophils Relative: 0.8 % (ref 0.0–3.0)
EOS PCT: 2.3 % (ref 0.0–5.0)
Eosinophils Absolute: 0.1 10*3/uL (ref 0.0–0.7)
HCT: 37 % (ref 36.0–46.0)
Hemoglobin: 12.1 g/dL (ref 12.0–15.0)
LYMPHS ABS: 3.3 10*3/uL (ref 0.7–4.0)
Lymphocytes Relative: 55.1 % — ABNORMAL HIGH (ref 12.0–46.0)
MCHC: 32.8 g/dL (ref 30.0–36.0)
MCV: 87.2 fl (ref 78.0–100.0)
MONOS PCT: 7.4 % (ref 3.0–12.0)
Monocytes Absolute: 0.4 10*3/uL (ref 0.1–1.0)
NEUTROS ABS: 2.1 10*3/uL (ref 1.4–7.7)
NEUTROS PCT: 34.4 % — AB (ref 43.0–77.0)
PLATELETS: 284 10*3/uL (ref 150.0–400.0)
RBC: 4.24 Mil/uL (ref 3.87–5.11)
RDW: 14 % (ref 11.5–15.5)
WBC: 6 10*3/uL (ref 4.0–10.5)

## 2016-01-31 LAB — BASIC METABOLIC PANEL
BUN: 17 mg/dL (ref 6–23)
CALCIUM: 9.8 mg/dL (ref 8.4–10.5)
CO2: 28 mEq/L (ref 19–32)
Chloride: 102 mEq/L (ref 96–112)
Creatinine, Ser: 1.06 mg/dL (ref 0.40–1.20)
GFR: 65.97 mL/min (ref >60.00)
Glucose, Bld: 112 mg/dL — ABNORMAL HIGH (ref 70–99)
Potassium: 4.7 mEq/L (ref 3.5–5.1)
SODIUM: 142 meq/L (ref 135–145)

## 2016-01-31 LAB — HEPATITIS C ANTIBODY: HCV Ab: NEGATIVE

## 2016-01-31 LAB — TSH: TSH: 1.76 u[IU]/mL (ref 0.35–4.50)

## 2016-01-31 LAB — LIPID PANEL
Cholesterol: 146 mg/dL (ref 0–200)
HDL: 48.5 mg/dL (ref >39.00)
LDL CALC: 67 mg/dL (ref 0–99)
NONHDL: 97.26
Total CHOL/HDL Ratio: 3
Triglycerides: 153 mg/dL — ABNORMAL HIGH (ref 0.0–149.0)
VLDL: 30.6 mg/dL (ref 0.0–40.0)

## 2016-01-31 LAB — HEPATIC FUNCTION PANEL
ALK PHOS: 57 U/L (ref 39–117)
ALT: 18 U/L (ref 0–35)
AST: 15 U/L (ref 0–37)
Albumin: 4.5 g/dL (ref 3.5–5.2)
BILIRUBIN DIRECT: 0.1 mg/dL (ref 0.0–0.3)
Total Bilirubin: 0.7 mg/dL (ref 0.2–1.2)
Total Protein: 7.1 g/dL (ref 6.0–8.3)

## 2016-01-31 LAB — SEDIMENTATION RATE: SED RATE: 9 mm/h (ref 0–30)

## 2016-01-31 LAB — C-REACTIVE PROTEIN: CRP: 0.2 mg/dL — AB (ref 0.5–20.0)

## 2016-01-31 LAB — HEMOGLOBIN A1C: HEMOGLOBIN A1C: 6.7 % — AB (ref 4.6–6.5)

## 2016-01-31 MED ORDER — TRAMADOL HCL 50 MG PO TABS: 50.0000 mg | ORAL_TABLET | Freq: Four times a day (QID) | ORAL | Status: DC | PRN

## 2016-01-31 NOTE — Assessment & Plan Note (Signed)
stable overall by history and exam, recent data reviewed with pt, and pt to continue medical treatment as before,  to f/u any worsening symptoms or concerns Lab Results  Component Value Date   LDLCALC 73 01/16/2015

## 2016-01-31 NOTE — Assessment & Plan Note (Signed)
stable overall by history and exam, recent data reviewed with pt, and pt to continue medical treatment as before,  to f/u any worsening symptoms or concerns Lab Results  Component Value Date   HGBA1C 7.1* 01/16/2015

## 2016-01-31 NOTE — Progress Notes (Signed)
Subjective:    Patient ID: Cindy Larson, female    DOB: Jul 06, 1946, 70 y.o.   MRN: [id number]  HPI  Here for wellness and f/u;  Overall doing ok;  Pt denies Chest pain, worsening SOB, DOE, wheezing, orthopnea, PND, worsening LE edema, palpitations, dizziness or syncope.  Pt denies neurological change such as new headache, facial or extremity weakness.  Pt denies polydipsia, polyuria, or low sugar symptoms. Pt states overall good compliance with treatment and medications, good tolerability, and has been trying to follow appropriate diet.  Pt denies worsening depressive symptoms, suicidal ideation or panic. No fever, night sweats, wt loss, loss of appetite, or other constitutional symptoms.  Pt states good ability with ADL's, has low fall risk, home safety reviewed and adequate, no other significant changes in hearing or vision, and only occasionally active with exercise. Wt Readings from Last 3 Encounters:  01/31/16 167 lb (75.751 kg)  01/01/16 169 lb (76.658 kg)  11/27/15 169 lb 6 oz (76.828 kg)   Also with bilat shoulder pain, has known spurring on the right, s/p cortisone per ortho, but really hurt last wk again, asking for further eval, tramadol helps. overall o/w doing ok,  Pt denies chest pain, increasing sob or doe, wheezing, orthopnea, PND, increased LE swelling, palpitations, dizziness or syncope.  Pt denies new neurological symptoms such as new headache, or facial or extremity weakness or numbness.  Pt denies polydipsia, polyuria, or low sugar episode.   Pt denies new neurological symptoms such as new headache, or facial or extremity weakness or numbness.   Pt states overall good compliance with meds, mostly trying to follow appropriate diet, with wt overall stable,  but little exercise however. / Past Medical History  Diagnosis Date  . DIABETES MELLITUS, TYPE II   . HYPERLIPIDEMIA   . OBESITY   . ANXIETY   . DEPRESSION   . HYPERTENSION   . ALLERGIC RHINITIS   . GERD   . HIATAL  HERNIA   . DIVERTICULOSIS, COLON   . OSTEOARTHRITIS     L knee  . OSTEOPENIA   . INTERMITTENT VERTIGO   . COLONIC POLYPS, HX OF   . Esophageal stricture   . Hypersomnolence   . Post-menopausal     hormone replacement therapy  . Diverticulitis   . Gallstones   . Pneumonia    Past Surgical History  Procedure Laterality Date  . Abdominal hysterectomy    . Cesarean section    . Oophorectomy      one  . Rotator cuff repair Left   . Lysis of adhesions    . Total knee arthroplasty Left   . Cholecystectomy      reports that she has never smoked. She has never used smokeless tobacco. She reports that she does not drink alcohol or use illicit drugs. family history includes Atrial fibrillation in her mother; Breast cancer in her maternal grandmother; Cancer in her mother; Colon cancer in her paternal aunt; Diabetes in her other; Heart disease in her mother; Heart failure in her brother; Hypertension in her mother; Kidney disease in her maternal aunt; Lung cancer in her brother; Ovarian cancer in her sister. Current Outpatient Prescriptions on File Prior to Visit  Medication Sig Dispense Refill  . ALPRAZolam (XANAX) 0.25 MG tablet TAKE ONE TABLET BY MOUTH TWICE DAILY AS NEEDED FOR ANXIETY 60 tablet 2  . aspirin EC 81 MG tablet Take 1 tablet (81 mg total) by mouth daily. 90 tablet 11  . atorvastatin (LIPITOR)  20 MG tablet TAKE ONE TABLET BY MOUTH ONCE DAILY 90 tablet 0  . Cholecalciferol (VITAMIN D) 2000 UNITS CAPS Take 2,000 Units by mouth daily.     . cyclobenzaprine (FLEXERIL) 5 MG tablet Take 1 tablet (5 mg total) by mouth 3 (three) times daily as needed for muscle spasms. 60 tablet 1  . furosemide (LASIX) 40 MG tablet Take 40 mg by mouth.    . gabapentin (NEURONTIN) 300 MG capsule Take 1 capsule (300 mg total) by mouth 3 (three) times daily. Start with 1 capsule at bedtime then advanced to one capsule twice a day and if need be increased to 1 capsule 3 times a day 90 capsule 5  .  glimepiride (AMARYL) 4 MG tablet TAKE ONE TABLET BY MOUTH TWICE DAILY 180 tablet 1  . lisinopril (PRINIVIL,ZESTRIL) 20 MG tablet Take 20 mg by mouth daily.    . meclizine (ANTIVERT) 25 MG tablet Take 25 mg by mouth every 6 (six) hours as needed for dizziness. Reported on 01/01/2016    . metFORMIN (GLUCOPHAGE) 500 MG tablet TAKE TWO TABLETS BY MOUTH TWICE DAILY WITH  A  MEAL 360 tablet 0  . omeprazole (PRILOSEC) 20 MG capsule Take 20 mg by mouth 2 (two) times daily before a meal.    . ONE TOUCH ULTRA TEST test strip USE AS DIRECTED ONE EVERY DAY 100 each 0  . ONETOUCH DELICA LANCETS 99991111 MISC USE AS DIRECTED ONE EVERY DAY ( YEARLY PHYSICAL IS DUE IN APRIL MUST SEE MD FOR FUTURE REFILLS) 100 each 1  . PARoxetine (PAXIL) 20 MG tablet Take 20 mg by mouth daily.     No current facility-administered medications on file prior to visit.    Review of Systems Constitutional: Negative for increased diaphoresis, or other activity, appetite or siginficant weight change other than noted HENT: Negative for worsening hearing loss, ear pain, facial swelling, mouth sores and neck stiffness.   Eyes: Negative for other worsening pain, redness or visual disturbance.  Respiratory: Negative for choking or stridor Cardiovascular: Negative for other chest pain and palpitations.  Gastrointestinal: Negative for worsening diarrhea, blood in stool, or abdominal distention Genitourinary: Negative for hematuria, flank pain or change in urine volume.  Musculoskeletal: Negative for myalgias or other joint complaints.  Skin: Negative for other color change and wound or drainage.  Neurological: Negative for syncope and numbness. other than noted Hematological: Negative for adenopathy. or other swelling Psychiatric/Behavioral: Negative for hallucinations, SI, self-injury, decreased concentration or other worsening agitation.      Objective:   Physical Exam BP 130/68 mmHg  Pulse 95  Temp(Src) 98 F (36.7 C) (Oral)  Resp 20   Wt 167 lb (75.751 kg)  SpO2 97% VS noted,  Constitutional: Pt is oriented to person, place, and time. Appears well-developed and well-nourished, in no significant distress Head: Normocephalic and atraumatic  Eyes: Conjunctivae and EOM are normal. Pupils are equal, round, and reactive to light Right Ear: External ear normal.  Left Ear: External ear normal Nose: Nose normal.  Mouth/Throat: Oropharynx is clear and moist  Neck: Normal range of motion. Neck supple. No JVD present. No tracheal deviation present or significant neck LA or mass Cardiovascular: Normal rate, regular rhythm, normal heart sounds and intact distal pulses.   Pulmonary/Chest: Effort normal and breath sounds without rales or wheezing  Abdominal: Soft. Bowel sounds are normal. NT. No HSM  Musculoskeletal: Normal range of motion. Exhibits no edema Lymphadenopathy: Has no cervical adenopathy.  Neurological: Pt is alert and oriented  to person, place, and time. Pt has normal reflexes. No cranial nerve deficit. Motor grossly intact Skin: Skin is warm and dry. No rash noted or new ulcers Psychiatric:  Has normal mood and affect. Behavior is normal.  Bilat shoulders with mild tender subacromial, no swelling, but decreased ROM to full forward elevation and abduction to 100 degrees bilat    Assessment & Plan:

## 2016-01-31 NOTE — Progress Notes (Signed)
Pre visit review using our clinic review tool, if applicable. No additional management support is needed unless otherwise documented below in the visit note. 

## 2016-01-31 NOTE — Assessment & Plan Note (Addendum)
?  OA vs inflammatory disorder - for esr, crp, consider rheum referral  In addition to the time spent performing CPE, I spent an additional 25 minutes face to face,in which greater than 50% of this time was spent in counseling and coordination of care for patient's acute illness as documented.

## 2016-01-31 NOTE — Patient Instructions (Signed)

## 2016-01-31 NOTE — Assessment & Plan Note (Signed)

## 2016-01-31 NOTE — Assessment & Plan Note (Signed)
stable overall by history and exam, recent data reviewed with pt, and pt to continue medical treatment as before,  to f/u any worsening symptoms or concerns BP Readings from Last 3 Encounters:  01/31/16 130/68  01/01/16 121/79  11/27/15 116/74

## 2016-02-21 ENCOUNTER — Other Ambulatory Visit: Payer: Self-pay | Admitting: Internal Medicine

## 2016-03-09 ENCOUNTER — Other Ambulatory Visit: Payer: Self-pay | Admitting: Internal Medicine

## 2016-03-19 ENCOUNTER — Other Ambulatory Visit: Payer: Self-pay | Admitting: Internal Medicine

## 2016-04-20 ENCOUNTER — Other Ambulatory Visit: Payer: Self-pay | Admitting: Internal Medicine

## 2016-04-26 ENCOUNTER — Other Ambulatory Visit: Payer: Self-pay | Admitting: Internal Medicine

## 2016-04-28 MED ORDER — PROMETHAZINE HCL 25 MG PO TABS: 25.0000 mg | ORAL_TABLET | Freq: Four times a day (QID) | ORAL | 0 refills | Status: DC | PRN

## 2016-04-28 NOTE — Telephone Encounter (Signed)
Refill sent to pharmacy.   

## 2016-04-28 NOTE — Addendum Note (Signed)
Addended by: Della Goo C on: 04/28/2016 08:36 AM   Modules accepted: Orders

## 2016-04-29 DIAGNOSIS — H40013 Open angle with borderline findings, low risk, bilateral: Secondary | ICD-10-CM | POA: Diagnosis not present

## 2016-04-29 DIAGNOSIS — H16223 Keratoconjunctivitis sicca, not specified as Sjogren's, bilateral: Secondary | ICD-10-CM | POA: Diagnosis not present

## 2016-04-29 DIAGNOSIS — E119 Type 2 diabetes mellitus without complications: Secondary | ICD-10-CM | POA: Diagnosis not present

## 2016-05-23 ENCOUNTER — Other Ambulatory Visit: Payer: Self-pay | Admitting: Internal Medicine

## 2016-05-30 ENCOUNTER — Other Ambulatory Visit: Payer: Self-pay | Admitting: Internal Medicine

## 2016-06-09 ENCOUNTER — Ambulatory Visit (INDEPENDENT_AMBULATORY_CARE_PROVIDER_SITE_OTHER): Payer: Medicare Other

## 2016-06-09 DIAGNOSIS — Z23 Encounter for immunization: Secondary | ICD-10-CM | POA: Diagnosis not present

## 2016-06-24 ENCOUNTER — Other Ambulatory Visit: Payer: Self-pay | Admitting: Internal Medicine

## 2016-08-04 ENCOUNTER — Ambulatory Visit (INDEPENDENT_AMBULATORY_CARE_PROVIDER_SITE_OTHER): Payer: Medicare Other | Admitting: Internal Medicine

## 2016-08-04 ENCOUNTER — Encounter: Payer: Self-pay | Admitting: Internal Medicine

## 2016-08-04 ENCOUNTER — Other Ambulatory Visit (INDEPENDENT_AMBULATORY_CARE_PROVIDER_SITE_OTHER): Payer: Medicare Other

## 2016-08-04 VITALS — BP 138/78 | HR 94 | Temp 97.5°F | Resp 20 | Wt 169.0 lb

## 2016-08-04 DIAGNOSIS — E785 Hyperlipidemia, unspecified: Secondary | ICD-10-CM | POA: Diagnosis not present

## 2016-08-04 DIAGNOSIS — E119 Type 2 diabetes mellitus without complications: Secondary | ICD-10-CM

## 2016-08-04 DIAGNOSIS — Z23 Encounter for immunization: Secondary | ICD-10-CM

## 2016-08-04 DIAGNOSIS — Z0001 Encounter for general adult medical examination with abnormal findings: Secondary | ICD-10-CM | POA: Diagnosis not present

## 2016-08-04 DIAGNOSIS — I10 Essential (primary) hypertension: Secondary | ICD-10-CM | POA: Diagnosis not present

## 2016-08-04 LAB — LIPID PANEL
CHOL/HDL RATIO: 3
Cholesterol: 143 mg/dL (ref 0–200)
HDL: 46.2 mg/dL (ref >39.00)
LDL Cholesterol: 70 mg/dL (ref 0–99)
NonHDL: 96.65
TRIGLYCERIDES: 132 mg/dL (ref 0.0–149.0)
VLDL: 26.4 mg/dL (ref 0.0–40.0)

## 2016-08-04 LAB — BASIC METABOLIC PANEL
BUN: 18 mg/dL (ref 6–23)
CHLORIDE: 103 meq/L (ref 96–112)
CO2: 29 mEq/L (ref 19–32)
CREATININE: 1.19 mg/dL (ref 0.40–1.20)
Calcium: 10 mg/dL (ref 8.4–10.5)
GFR: 57.64 mL/min — ABNORMAL LOW (ref >60.00)
Glucose, Bld: 130 mg/dL — ABNORMAL HIGH (ref 70–99)
Potassium: 4.3 mEq/L (ref 3.5–5.1)
Sodium: 141 mEq/L (ref 135–145)

## 2016-08-04 LAB — HEMOGLOBIN A1C: Hgb A1c MFr Bld: 6.7 % — ABNORMAL HIGH (ref 4.6–6.5)

## 2016-08-04 LAB — HEPATIC FUNCTION PANEL
ALT: 15 U/L (ref 0–35)
AST: 14 U/L (ref 0–37)
Albumin: 4.4 g/dL (ref 3.5–5.2)
Alkaline Phosphatase: 63 U/L (ref 39–117)
BILIRUBIN TOTAL: 0.6 mg/dL (ref 0.2–1.2)
Bilirubin, Direct: 0.1 mg/dL (ref 0.0–0.3)
TOTAL PROTEIN: 7.2 g/dL (ref 6.0–8.3)

## 2016-08-04 MED ORDER — ALPRAZOLAM 0.25 MG PO TABS: 0.2500 mg | ORAL_TABLET | Freq: Two times a day (BID) | ORAL | 2 refills | Status: DC | PRN

## 2016-08-04 MED ORDER — TRAMADOL HCL 50 MG PO TABS: 50.0000 mg | ORAL_TABLET | Freq: Four times a day (QID) | ORAL | 1 refills | Status: DC | PRN

## 2016-08-04 MED ORDER — CYCLOBENZAPRINE HCL 5 MG PO TABS: 5.0000 mg | ORAL_TABLET | Freq: Three times a day (TID) | ORAL | 1 refills | Status: DC | PRN

## 2016-08-04 MED ORDER — GABAPENTIN 300 MG PO CAPS: 300.0000 mg | ORAL_CAPSULE | Freq: Three times a day (TID) | ORAL | 5 refills | Status: DC

## 2016-08-04 NOTE — Patient Instructions (Signed)
You had the Pneumovax pneumonia shot today  Please continue all other medications as before, and refills have been done if requested.  Please have the pharmacy call with any other refills you may need.  Please continue your efforts at being more active, low cholesterol diet, and weight control.  You are otherwise up to date with prevention measures today.  Please keep your appointments with your specialists as you may have planned  Please go to the LAB in the Basement (turn left off the elevator) for the tests to be done today  You will be contacted by phone if any changes need to be made immediately.  Otherwise, you will receive a letter about your results with an explanation, but please check with MyChart first.  Please remember to sign up for MyChart if you have not done so, as this will be important to you in the future with finding out test results, communicating by private email, and scheduling acute appointments online when needed.  Please return in 6 months, or sooner if needed, with Lab testing done 3-5 days before    

## 2016-08-04 NOTE — Progress Notes (Signed)
Pre visit review using our clinic review tool, if applicable. No additional management support is needed unless otherwise documented below in the visit note. 

## 2016-08-04 NOTE — Progress Notes (Signed)
Subjective:    Patient ID: Cindy Larson, female    DOB: Apr 08, 1946, 70 y.o.   MRN: [id number]  HPI  Here to f/u; overall doing ok,  Pt denies chest pain, increasing sob or doe, wheezing, orthopnea, PND, increased LE swelling, palpitations, dizziness or syncope.  Pt denies new neurological symptoms such as new headache, or facial or extremity weakness or numbness.  Pt denies polydipsia, polyuria, or low sugar episode.   Pt denies new neurological symptoms such as new headache, or facial or extremity weakness or numbness.   Pt states overall good compliance with meds, mostly trying to follow appropriate diet, with wt overall stable,  but little exercise however. No new history changes Wt Readings from Last 3 Encounters:  08/04/16 169 lb (76.7 kg)  01/31/16 167 lb (75.8 kg)  01/01/16 169 lb (76.7 kg)   Past Medical History:  Diagnosis Date  . ALLERGIC RHINITIS   . ANXIETY   . COLONIC POLYPS, HX OF   . DEPRESSION   . DIABETES MELLITUS, TYPE II   . Diverticulitis   . DIVERTICULOSIS, COLON   . Esophageal stricture   . Gallstones   . GERD   . HIATAL HERNIA   . HYPERLIPIDEMIA   . Hypersomnolence   . HYPERTENSION   . INTERMITTENT VERTIGO   . OBESITY   . OSTEOARTHRITIS    L knee  . OSTEOPENIA   . Pneumonia   . Post-menopausal    hormone replacement therapy   Past Surgical History:  Procedure Laterality Date  . ABDOMINAL HYSTERECTOMY    . CESAREAN SECTION    . CHOLECYSTECTOMY    . Lysis of Adhesions    . OOPHORECTOMY     one  . ROTATOR CUFF REPAIR Left   . TOTAL KNEE ARTHROPLASTY Left     reports that she has never smoked. She has never used smokeless tobacco. She reports that she does not drink alcohol or use drugs. family history includes Atrial fibrillation in her mother; Breast cancer in her maternal grandmother; Cancer in her mother; Colon cancer in her paternal aunt; Diabetes in her other; Heart disease in her mother; Heart failure in her brother; Hypertension in her  mother; Kidney disease in her maternal aunt; Lung cancer in her brother; Ovarian cancer in her sister. Allergies  Allergen Reactions  . Aspirin Other (See Comments)    Stomach hurts  . Penicillins Hives    blisters  . Prevnar [Pneumococcal 13-Val Conj Vacc] Swelling    Left arm swelling  . Zocor [Simvastatin]     REACTION: itch   Current Outpatient Prescriptions on File Prior to Visit  Medication Sig Dispense Refill  . aspirin EC 81 MG tablet Take 1 tablet (81 mg total) by mouth daily. 90 tablet 11  . atorvastatin (LIPITOR) 20 MG tablet TAKE ONE TABLET BY MOUTH ONCE DAILY 90 tablet 0  . Cholecalciferol (VITAMIN D) 2000 UNITS CAPS Take 2,000 Units by mouth daily.     . furosemide (LASIX) 40 MG tablet Take 40 mg by mouth.    Marland Kitchen glimepiride (AMARYL) 4 MG tablet TAKE ONE TABLET BY MOUTH TWICE DAILY 180 tablet 0  . glucose blood (ONE TOUCH ULTRA TEST) test strip 1 each by Other route daily. Use to check blood sugars daily. Dx E11.9 25 each 3  . lisinopril (PRINIVIL,ZESTRIL) 20 MG tablet Take 20 mg by mouth daily.    . meclizine (ANTIVERT) 25 MG tablet Take 25 mg by mouth every 6 (six) hours as needed  for dizziness. Reported on 01/01/2016    . metFORMIN (GLUCOPHAGE) 500 MG tablet TAKE TWO TABLETS BY MOUTH TWICE DAILY WITH  A  MEAL 360 tablet 0  . omeprazole (PRILOSEC) 20 MG capsule Take 20 mg by mouth 2 (two) times daily before a meal.    . ONETOUCH DELICA LANCETS 24P MISC Use to help check blood sugars daily. E11.9 100 each 1  . PARoxetine (PAXIL) 20 MG tablet TAKE ONE TABLET BY MOUTH ONCE DAILY 90 tablet 3  . promethazine (PHENERGAN) 25 MG tablet Take 1 tablet (25 mg total) by mouth every 6 (six) hours as needed. for nausea 40 tablet 0   No current facility-administered medications on file prior to visit.    Review of Systems  Constitutional: Negative for unusual diaphoresis or night sweats HENT: Negative for ear swelling or discharge Eyes: Negative for worsening visual haziness    Respiratory: Negative for choking and stridor.   Gastrointestinal: Negative for distension or worsening eructation Genitourinary: Negative for retention or change in urine volume.  Musculoskeletal: Negative for other MSK pain or swelling Skin: Negative for color change and worsening wound Neurological: Negative for tremors and numbness other than noted  Psychiatric/Behavioral: Negative for decreased concentration or agitation other than above   All other system neg per pt    Objective:   Physical Exam BP 138/78   Pulse 94   Temp 97.5 F (36.4 C) (Oral)   Resp 20   Wt 169 lb (76.7 kg)   SpO2 94%   BMI 35.32 kg/m  .VS noted, not ill appearing Constitutional: Pt appears in no apparent distress HENT: Head: NCAT.  Right Ear: External ear normal.  Left Ear: External ear normal.  Eyes: . Pupils are equal, round, and reactive to light. Conjunctivae and EOM are normal Neck: Normal range of motion. Neck supple.  Cardiovascular: Normal rate and regular rhythm.   Pulmonary/Chest: Effort normal and breath sounds without rales or wheezing.  Neurological: Pt is alert. Not confused , motor grossly intact Skin: Skin is warm. No rash, no LE edema Psychiatric: Pt behavior is normal. No agitation.  No other new exam findings    Assessment & Plan:

## 2016-08-10 ENCOUNTER — Other Ambulatory Visit: Payer: Self-pay | Admitting: Internal Medicine

## 2016-08-11 NOTE — Assessment & Plan Note (Signed)
stable overall by history and exam, recent data reviewed with pt, and pt to continue medical treatment as before,  to f/u any worsening symptoms or concerns BP Readings from Last 3 Encounters:  08/04/16 138/78  01/31/16 130/68  01/01/16 121/79

## 2016-08-11 NOTE — Assessment & Plan Note (Signed)
stable overall by history and exam, recent data reviewed with pt, and pt to continue medical treatment as before,  to f/u any worsening symptoms or concerns Lab Results  Component Value Date   HGBA1C 6.7 (H) 08/04/2016

## 2016-08-11 NOTE — Assessment & Plan Note (Signed)
stable overall by history and exam, recent data reviewed with pt, and pt to continue medical treatment as before,  to f/u any worsening symptoms or concerns Lab Results  Component Value Date   LDLCALC 70 08/04/2016

## 2016-08-14 ENCOUNTER — Other Ambulatory Visit: Payer: Self-pay | Admitting: Internal Medicine

## 2016-08-21 ENCOUNTER — Telehealth: Payer: Self-pay | Admitting: Emergency Medicine

## 2016-08-21 ENCOUNTER — Other Ambulatory Visit: Payer: Self-pay | Admitting: Internal Medicine

## 2016-08-21 DIAGNOSIS — Z1239 Encounter for other screening for malignant neoplasm of breast: Secondary | ICD-10-CM

## 2016-08-21 DIAGNOSIS — N644 Mastodynia: Secondary | ICD-10-CM

## 2016-08-21 NOTE — Telephone Encounter (Signed)
Order/ referral done

## 2016-08-21 NOTE — Telephone Encounter (Signed)
Pt called and wants to know if she can get a referral put in to have a mammogram done. Please advise thanks.

## 2016-09-08 ENCOUNTER — Telehealth: Payer: Self-pay | Admitting: Internal Medicine

## 2016-09-08 DIAGNOSIS — N644 Mastodynia: Secondary | ICD-10-CM

## 2016-09-08 NOTE — Telephone Encounter (Signed)
Referral done

## 2016-09-08 NOTE — Telephone Encounter (Signed)
Patient is requesting diagnostic mammogram referral to be sent to [facility].  Patient states she called to schedule mammogram and told office that her breast were sore.

## 2016-09-10 NOTE — Telephone Encounter (Signed)
Can you please add the diagnostic US also. [place] [id number]

## 2016-09-10 NOTE — Telephone Encounter (Signed)
Done orders

## 2016-09-17 ENCOUNTER — Other Ambulatory Visit: Payer: Medicare Other

## 2016-09-18 ENCOUNTER — Other Ambulatory Visit: Payer: Medicare Other

## 2016-09-23 ENCOUNTER — Ambulatory Visit
Admission: RE | Admit: 2016-09-23 | Discharge: 2016-09-23 | Disposition: A | Discharge status: Home / Self Care | Payer: Medicare Other | Source: Ambulatory Visit | Attending: Internal Medicine | Admitting: Internal Medicine

## 2016-09-23 DIAGNOSIS — N644 Mastodynia: Secondary | ICD-10-CM

## 2016-10-05 ENCOUNTER — Other Ambulatory Visit: Payer: Self-pay | Admitting: Internal Medicine

## 2016-10-20 ENCOUNTER — Ambulatory Visit: Payer: Medicare Other

## 2016-11-19 ENCOUNTER — Other Ambulatory Visit: Payer: Self-pay | Admitting: Internal Medicine

## 2016-11-25 ENCOUNTER — Encounter: Payer: Self-pay | Admitting: Internal Medicine

## 2016-12-07 DIAGNOSIS — M25561 Pain in right knee: Secondary | ICD-10-CM | POA: Diagnosis not present

## 2016-12-18 ENCOUNTER — Other Ambulatory Visit: Payer: Self-pay | Admitting: Internal Medicine

## 2016-12-25 ENCOUNTER — Encounter: Payer: Self-pay | Admitting: Internal Medicine

## 2017-01-05 ENCOUNTER — Other Ambulatory Visit: Payer: Self-pay | Admitting: Internal Medicine

## 2017-02-03 ENCOUNTER — Ambulatory Visit (AMBULATORY_SURGERY_CENTER): Payer: Self-pay

## 2017-02-03 VITALS — Ht 59.0 in | Wt 172.0 lb

## 2017-02-03 DIAGNOSIS — Z860100 Personal history of colon polyps, unspecified: Secondary | ICD-10-CM

## 2017-02-03 MED ORDER — SUPREP BOWEL PREP KIT 17.5-3.13-1.6 GM/177ML PO SOLN: 1.0000 | Freq: Once | ORAL | 0 refills | Status: AC

## 2017-02-03 NOTE — Progress Notes (Signed)
No allergies to eggs or soy No diet meds No home oxygen No past problems with anesthesia  Declined emmi 

## 2017-02-10 DIAGNOSIS — H25813 Combined forms of age-related cataract, bilateral: Secondary | ICD-10-CM | POA: Diagnosis not present

## 2017-02-10 DIAGNOSIS — H40033 Anatomical narrow angle, bilateral: Secondary | ICD-10-CM | POA: Diagnosis not present

## 2017-02-16 ENCOUNTER — Encounter: Payer: Self-pay | Admitting: Internal Medicine

## 2017-02-17 ENCOUNTER — Other Ambulatory Visit: Payer: Self-pay | Admitting: Internal Medicine

## 2017-02-24 ENCOUNTER — Other Ambulatory Visit: Payer: Self-pay | Admitting: Internal Medicine

## 2017-03-02 ENCOUNTER — Ambulatory Visit (AMBULATORY_SURGERY_CENTER): Payer: Medicare Other | Admitting: Internal Medicine

## 2017-03-02 ENCOUNTER — Encounter: Payer: Self-pay | Admitting: Internal Medicine

## 2017-03-02 VITALS — BP 94/70 | HR 89 | Temp 97.1°F | Resp 13 | Ht 59.0 in | Wt 172.0 lb

## 2017-03-02 DIAGNOSIS — K6389 Other specified diseases of intestine: Secondary | ICD-10-CM | POA: Diagnosis not present

## 2017-03-02 DIAGNOSIS — Z860100 Personal history of colon polyps, unspecified: Secondary | ICD-10-CM

## 2017-03-02 DIAGNOSIS — D124 Benign neoplasm of descending colon: Secondary | ICD-10-CM | POA: Diagnosis not present

## 2017-03-02 DIAGNOSIS — D123 Benign neoplasm of transverse colon: Secondary | ICD-10-CM

## 2017-03-02 HISTORY — PX: COLONOSCOPY: SHX174

## 2017-03-02 MED ORDER — SODIUM CHLORIDE 0.9 % IV SOLN: 500.0000 mL | INTRAVENOUS | Status: DC

## 2017-03-02 NOTE — Patient Instructions (Signed)
YOU HAD AN ENDOSCOPIC PROCEDURE TODAY AT [facility]:   Refer to the procedure report that was given to you for any specific questions about what was found during the examination.  If the procedure report does not answer your questions, please call your gastroenterologist to clarify.  If you requested that your care partner not be given the details of your procedure findings, then the procedure report has been included in a sealed envelope for you to review at your convenience later.  YOU SHOULD EXPECT: Some feelings of bloating in the abdomen. Passage of more gas than usual.  Walking can help get rid of the air that was put into your GI tract during the procedure and reduce the bloating. If you had a lower endoscopy (such as a colonoscopy or flexible sigmoidoscopy) you may notice spotting of blood in your stool or on the toilet paper. If you underwent a bowel prep for your procedure, you may not have a normal bowel movement for a few days.  Please Note:  You might notice some irritation and congestion in your nose or some drainage.  This is from the oxygen used during your procedure.  There is no need for concern and it should clear up in a day or so.  SYMPTOMS TO REPORT IMMEDIATELY:   Following lower endoscopy (colonoscopy or flexible sigmoidoscopy):  Excessive amounts of blood in the stool  Significant tenderness or worsening of abdominal pains  Swelling of the abdomen that is new, acute  Fever of 100F or higher   For urgent or emergent issues, a gastroenterologist can be reached at any hour by calling [phone].   DIET:  We do recommend a small meal at first, but then you may proceed to your regular diet.  Drink plenty of fluids but you should avoid alcoholic beverages for 24 hours.  ACTIVITY:  You should plan to take it easy for the rest of today and you should NOT DRIVE or use heavy machinery until tomorrow (because of the sedation medicines used during the test).     FOLLOW UP: Our staff will call the number listed on your records the next business day following your procedure to check on you and address any questions or concerns that you may have regarding the information given to you following your procedure. If we do not reach you, we will leave a message.  However, if you are feeling well and you are not experiencing any problems, there is no need to return our call.  We will assume that you have returned to your regular daily activities without incident.  If any biopsies were taken you will be contacted by phone or by letter within the next 1-3 weeks.  Please call us at [phone] if you have not heard about the biopsies in 3 weeks.    SIGNATURES/CONFIDENTIALITY: You and/or your care partner have signed paperwork which will be entered into your electronic medical record.  These signatures attest to the fact that that the information above on your After Visit Summary has been reviewed and is understood.  Full responsibility of the confidentiality of this discharge information lies with you and/or your care-partner.  Polyp, diverticulosis, high fiber diet and hemorrhoid information given.

## 2017-03-02 NOTE — Progress Notes (Signed)
Pt's states no medical or surgical changes since previsit or office visit. 

## 2017-03-02 NOTE — Op Note (Signed)
[facility] Patient Name: Cindy Larson Procedure Date: 03/02/2017 7:52 AM MRN: [id number] Endoscopist: Docia Chuck. [name] , MD Age: 71 Referring MD:  Date of Birth: 1945-12-30 Gender: Female Account #: [id number] Procedure:                Colonoscopy, with biopsies and cold snare                            polypectomy x 1 Indications:              High risk colon cancer surveillance: Personal                            history of adenoma (10 mm or greater in size), High                            risk colon cancer surveillance: Personal history of                            adenoma with high grade dysplasia, High risk colon                            cancer surveillance: Personal history of multiple                            (3 or more) adenomas. Multiple previous exams 2000,                            2002, 2005, 2011, and 2017 (most recent exam with                            multiple polyps and large sessile lesion removed                            piecemeal with high-grade dysplasia, tattoo) Medicines:                Monitored Anesthesia Care Procedure:                Pre-Anesthesia Assessment:                           - Prior to the procedure, a History and Physical                            was performed, and patient medications and                            allergies were reviewed. The patient's tolerance of                            previous anesthesia was also reviewed. The risks                            and benefits of the procedure and the sedation  options and risks were discussed with the patient.                            All questions were answered, and informed consent                            was obtained. Prior Anticoagulants: The patient has                            taken no previous anticoagulant or antiplatelet                            agents. ASA Grade Assessment: II - A patient with                            mild  systemic disease. After reviewing the risks                            and benefits, the patient was deemed in                            satisfactory condition to undergo the procedure.                           After obtaining informed consent, the colonoscope                            was passed under direct vision. Throughout the                            procedure, the patient's blood pressure, pulse, and                            oxygen saturations were monitored continuously. The                            Model PCF-[id number]) scope was introduced                            through the anus and advanced to the the cecum,                            identified by appendiceal orifice and ileocecal                            valve. The ileocecal valve, appendiceal orifice,                            and rectum were photographed. The quality of the                            bowel preparation was excellent. The colonoscopy  was performed without difficulty. The patient                            tolerated the procedure well. The bowel preparation                            used was SUPREP.                           RECTAL: Anal stenosis Scope In: 8:17:59 AM Scope Out: 8:38:00 AM Scope Withdrawal Time: 0 hours 14 minutes 4 seconds  Total Procedure Duration: 0 hours 20 minutes 1 second  Findings:                 A polypectomy scar just proximal to previously                            placed tattoo found in the proximal transverse                            colon. No obvious neoplastic tissue. Multiple                            Biopsies were taken with a cold forceps for                            histology.                           A 4 mm polyp was found in the descending colon. The                            polyp was removed with a cold snare. Resection and                            retrieval were complete.                           Many  small-mouthed diverticula were found in the                            left colon and right colon. Rectosigmoid stenosis.                           Internal hemorrhoids were found during retroflexion.                           The exam was otherwise without abnormality on                            direct and retroflexion views. Complications:            No immediate complications. Estimated blood loss:                            None. Estimated Blood Loss:  Estimated blood loss: none. Impression:               - One small polyp in the proximal transverse colon.                            Biopsied.                           - One 4 mm polyp in the descending colon, removed                            with a cold snare. Resected and retrieved.                           - Diverticulosis in the left colon and in the right                            colon.                           - Internal hemorrhoids.                           - The examination was otherwise normal on direct                            and retroflexion views. Recommendation:           - Repeat colonoscopy in 3 years for surveillance if                            biopsies of prior polypectomy site negative for                            neoplasia (PEDIATRIC SCOPE).                           - Patient has a contact number available for                            emergencies. The signs and symptoms of potential                            delayed complications were discussed with the                            patient. Return to normal activities tomorrow.                            Written discharge instructions were provided to the                            patient.                           - Resume previous diet.                           -  Continue present medications.                           - Await pathology results. Docia Chuck. [name], MD 03/02/2017 8:48:24 AM This report has been signed electronically.

## 2017-03-02 NOTE — Progress Notes (Signed)
Called to room to assist during endoscopic procedure.  Patient ID and intended procedure confirmed with present staff. Received instructions for my participation in the procedure from the performing physician.  

## 2017-03-02 NOTE — Progress Notes (Signed)
A/ox3 pleased with MAC, report to Jane RN 

## 2017-03-04 ENCOUNTER — Telehealth: Payer: Self-pay

## 2017-03-04 NOTE — Telephone Encounter (Signed)
  Follow up Call-  Call back number 03/02/2017 01/01/2016  Post procedure Call Back phone  # [phone]  Permission to leave phone message Yes Yes  Some recent data might be hidden     Patient questions:  Do you have a fever, pain , or abdominal swelling? No. Pain Score  0 *  Have you tolerated food without any problems? Yes.    Have you been able to return to your normal activities? Yes.    Do you have any questions about your discharge instructions: Diet   No. Medications  No. Follow up visit  No.  Do you have questions or concerns about your Care? No.  Actions: * If pain score is 4 or above: No action needed, pain <4.

## 2017-03-08 DIAGNOSIS — M25561 Pain in right knee: Secondary | ICD-10-CM | POA: Diagnosis not present

## 2017-03-09 ENCOUNTER — Encounter: Payer: Self-pay | Admitting: Internal Medicine

## 2017-03-10 DIAGNOSIS — H40033 Anatomical narrow angle, bilateral: Secondary | ICD-10-CM | POA: Diagnosis not present

## 2017-03-15 ENCOUNTER — Other Ambulatory Visit: Payer: Self-pay | Admitting: Internal Medicine

## 2017-03-18 ENCOUNTER — Other Ambulatory Visit: Payer: Self-pay | Admitting: Internal Medicine

## 2017-04-11 ENCOUNTER — Other Ambulatory Visit: Payer: Self-pay | Admitting: Internal Medicine

## 2017-04-13 NOTE — Telephone Encounter (Signed)
faxed

## 2017-04-13 NOTE — Telephone Encounter (Signed)
Done hardcopy to Shirron  

## 2017-04-21 ENCOUNTER — Telehealth: Payer: Self-pay | Admitting: Internal Medicine

## 2017-04-21 MED ORDER — CIPROFLOXACIN HCL 500 MG PO TABS: 500.0000 mg | ORAL_TABLET | Freq: Two times a day (BID) | ORAL | 0 refills | Status: DC

## 2017-04-21 MED ORDER — METRONIDAZOLE 500 MG PO TABS: 500.0000 mg | ORAL_TABLET | Freq: Two times a day (BID) | ORAL | 0 refills | Status: DC

## 2017-04-21 NOTE — Telephone Encounter (Signed)
Pt scheduled to see Nicoletta Ba PA 04/26/17@2pm . Scripts sent to pharmacy and pt aware.

## 2017-04-21 NOTE — Telephone Encounter (Signed)
Start ciprofloxacin 500 mg twice a day and metronidazole 500 mg twice a day each for 1 week. Have her see an extender this week or early next. Thanks

## 2017-04-21 NOTE — Telephone Encounter (Signed)
Pt states that she has been having discomfort in her LLQ for about a month but the last 2 weeks it has gotten worse. Reports running a fever this last week up to 101. States it feels it did when she had diverticulitis in the past. Please advise.

## 2017-04-26 ENCOUNTER — Ambulatory Visit (INDEPENDENT_AMBULATORY_CARE_PROVIDER_SITE_OTHER): Payer: Medicare Other | Admitting: Physician Assistant

## 2017-04-26 ENCOUNTER — Encounter: Payer: Self-pay | Admitting: Physician Assistant

## 2017-04-26 ENCOUNTER — Other Ambulatory Visit (INDEPENDENT_AMBULATORY_CARE_PROVIDER_SITE_OTHER): Payer: Medicare Other

## 2017-04-26 VITALS — BP 110/70 | HR 68 | Ht 59.0 in | Wt 171.1 lb

## 2017-04-26 DIAGNOSIS — K5732 Diverticulitis of large intestine without perforation or abscess without bleeding: Secondary | ICD-10-CM | POA: Diagnosis not present

## 2017-04-26 DIAGNOSIS — K219 Gastro-esophageal reflux disease without esophagitis: Secondary | ICD-10-CM

## 2017-04-26 DIAGNOSIS — R1032 Left lower quadrant pain: Secondary | ICD-10-CM | POA: Diagnosis not present

## 2017-04-26 LAB — BASIC METABOLIC PANEL
BUN: 13 mg/dL (ref 6–23)
CHLORIDE: 103 meq/L (ref 96–112)
CO2: 24 meq/L (ref 19–32)
CREATININE: 1.15 mg/dL (ref 0.40–1.20)
Calcium: 9.2 mg/dL (ref 8.4–10.5)
GFR: 59.84 mL/min — ABNORMAL LOW (ref >60.00)
Glucose, Bld: 145 mg/dL — ABNORMAL HIGH (ref 70–99)
POTASSIUM: 3.9 meq/L (ref 3.5–5.1)
Sodium: 137 mEq/L (ref 135–145)

## 2017-04-26 LAB — CBC WITH DIFFERENTIAL/PLATELET
BASOS ABS: 0.1 10*3/uL (ref 0.0–0.1)
Basophils Relative: 0.8 % (ref 0.0–3.0)
EOS ABS: 0.1 10*3/uL (ref 0.0–0.7)
Eosinophils Relative: 2.2 % (ref 0.0–5.0)
HEMATOCRIT: 38.4 % (ref 36.0–46.0)
HEMOGLOBIN: 12.2 g/dL (ref 12.0–15.0)
LYMPHS PCT: 47.5 % — AB (ref 12.0–46.0)
Lymphs Abs: 3.2 10*3/uL (ref 0.7–4.0)
MCHC: 31.9 g/dL (ref 30.0–36.0)
MCV: 90.7 fl (ref 78.0–100.0)
MONOS PCT: 6.5 % (ref 3.0–12.0)
Monocytes Absolute: 0.4 10*3/uL (ref 0.1–1.0)
NEUTROS ABS: 2.9 10*3/uL (ref 1.4–7.7)
Neutrophils Relative %: 43 % (ref 43.0–77.0)
PLATELETS: 312 10*3/uL (ref 150.0–400.0)
RBC: 4.23 Mil/uL (ref 3.87–5.11)
RDW: 14.1 % (ref 11.5–15.5)
WBC: 6.7 10*3/uL (ref 4.0–10.5)

## 2017-04-26 MED ORDER — METRONIDAZOLE 500 MG PO TABS: 500.0000 mg | ORAL_TABLET | Freq: Two times a day (BID) | ORAL | 0 refills | Status: DC

## 2017-04-26 MED ORDER — CIPROFLOXACIN HCL 500 MG PO TABS: 500.0000 mg | ORAL_TABLET | Freq: Two times a day (BID) | ORAL | 0 refills | Status: DC

## 2017-04-26 NOTE — Progress Notes (Signed)
Patient ID: Cindy Larson, female   DOB: [birth year], 71 y.o.   MRN: [id number]     Subjective:    Patient ID: Cindy Larson, female    DOB: [birth year], 71 y.o.   MRN: [id number]  HPI Cindy Larson is a pleasant 71 year old African-American female known to Dr. [name]. She has history of hypertension, GERD, osteoarthritis, diabetes, obesity, and has had personal history of multiple adenomatous colon polyps. She comes in today with 2-3 week history of left lower quadrant discomfort which worsened last week. She says this feels similar to prior episodes of diverticulitis. She describes it as a constant aching sometimes cramping pain in the left lower quadrant. She has not had any associated fever or chills, bowel movements have been normal, without melena or hematochezia. She called late last week and was started on a course of Cipro 500 mg by mouth twice a day and Flagyl 500 mg by mouth twice a day on Thursday, 04/22/2017. She says she felt a little bit better the following day but then over this weekend has had some  increase in discomfort. She has been eating a regular diet. She denies any current dysuria. Last colonoscopy was done on 03/02/2017 for follow-up of a large sessile polyp which had been removed piecemeal and was found to have high-grade dysplasia and was tattooed. Multiple biopsies were done from the area of post-polypectomy scar and an additional 4 mm polyp was removed from the descending colon and found to be a tubular adenoma. Biopsies from the scar showed benign lymphoid aggregate no adenomatous change. She's also noted have multiple diverticuli in the right and left colon.Follow-up colonoscopy planned at 3 year interval.  Review of Systems Pertinent positive and negative review of systems were noted in the above HPI section.  All other review of systems was otherwise negative.  Outpatient Encounter Prescriptions as of 04/26/2017  Medication Sig  . ALPRAZolam (XANAX) 0.25 MG tablet Take  1 tablet (0.25 mg total) by mouth 2 (two) times daily as needed. for anxiety  . aspirin EC 81 MG tablet Take 1 tablet (81 mg total) by mouth daily.  Marland Kitchen atorvastatin (LIPITOR) 20 MG tablet TAKE ONE TABLET BY MOUTH ONCE DAILY  . Cholecalciferol (VITAMIN D) 2000 UNITS CAPS Take 2,000 Units by mouth daily.   . ciprofloxacin (CIPRO) 500 MG tablet Take 1 tablet (500 mg total) by mouth 2 (two) times daily.  . cyclobenzaprine (FLEXERIL) 5 MG tablet Take 1 tablet (5 mg total) by mouth 3 (three) times daily as needed for muscle spasms.  . furosemide (LASIX) 40 MG tablet Take 40 mg by mouth.  . gabapentin (NEURONTIN) 300 MG capsule Take 1 capsule (300 mg total) by mouth 3 (three) times daily. Start with 1 capsule at bedtime then advanced to one capsule twice a day and if need be increased to 1 capsule 3 times a day  . glimepiride (AMARYL) 4 MG tablet Take 1 tablet (4 mg total) by mouth 2 (two) times daily.  Marland Kitchen glucose blood (ONE TOUCH ULTRA TEST) test strip 1 each by Other route daily. Use to check blood sugars daily. Dx E11.9  . lisinopril (PRINIVIL,ZESTRIL) 20 MG tablet Take 20 mg by mouth daily.  . meclizine (ANTIVERT) 25 MG tablet Take 25 mg by mouth every 6 (six) hours as needed for dizziness. Reported on 01/01/2016  . metFORMIN (GLUCOPHAGE) 500 MG tablet TAKE TWO TABLETS BY MOUTH TWICE DAILY WITH  A  MEAL  . metroNIDAZOLE (FLAGYL) 500 MG tablet Take 1 tablet (  500 mg total) by mouth 2 (two) times daily.  Marland Kitchen omeprazole (PRILOSEC) 20 MG capsule Take 20 mg by mouth 2 (two) times daily before a meal.  . ONETOUCH DELICA LANCETS 76H MISC Use to help check blood sugars daily. E11.9  . ONETOUCH DELICA LANCETS 60V MISC USE AS DIRECTED. ONE EVERY DAY.  Marland Kitchen OVER THE COUNTER MEDICATION Tumeric sport 500 mg daily po  . PARoxetine (PAXIL) 20 MG tablet TAKE ONE TABLET BY MOUTH ONCE DAILY  . promethazine (PHENERGAN) 25 MG tablet TAKE ONE TABLET BY MOUTH EVERY 6 HOURS AS NEEDED FOR NAUSEA  . traMADol (ULTRAM) 50 MG tablet  TAKE 1 TABLET BY MOUTH EVERY 6 HOURS AS NEEDED  . [DISCONTINUED] ciprofloxacin (CIPRO) 500 MG tablet Take 1 tablet (500 mg total) by mouth 2 (two) times daily.  . [DISCONTINUED] metroNIDAZOLE (FLAGYL) 500 MG tablet Take 1 tablet (500 mg total) by mouth 2 (two) times daily.   Facility-Administered Encounter Medications as of 04/26/2017  Medication  . 0.9 %  sodium chloride infusion   Allergies  Allergen Reactions  . Aspirin Other (See Comments)    Stomach hurts  . Penicillins Hives    blisters  . Prevnar [Pneumococcal 13-Val Conj Vacc] Swelling    Left arm swelling  . Zocor [Simvastatin]     REACTION: itch   Patient Active Problem List   Diagnosis Date Noted  . Bilateral shoulder pain 01/31/2016  . Chest pain 04/18/2015  . Left arm pain 07/25/2013  . Neck pain on left side 06/15/2013  . LLQ abdominal pain 10/11/2012  . Back pain 03/03/2012  . Right upper lobe pneumonia ([place]) 02/16/2012  . Encounter for preventative adult health care exam with abnormal findings 05/21/2011  . ABDOMINAL PAIN OTHER SPECIFIED SITE 10/24/2010  . INTERMITTENT VERTIGO 08/07/2009  . Dehydration 01/11/2009  . OSTEOPENIA 04/09/2008  . PARESTHESIA 03/08/2008  . Diabetes ([place]) 07/03/2007  . Hyperlipidemia 07/03/2007  . OBESITY 07/03/2007  . ANXIETY 07/03/2007  . DEPRESSION 07/03/2007  . Essential hypertension 07/03/2007  . ALLERGIC RHINITIS 07/03/2007  . GERD 07/03/2007  . HIATAL HERNIA 07/03/2007  . DIVERTICULOSIS, COLON 07/03/2007  . OSTEOARTHRITIS 07/03/2007  . History of colonic polyps 07/03/2007   Social History   Social History  . Marital status: Married    Spouse name: N/A  . Number of children: 3  . Years of education: N/A   Occupational History  . Mail Clerk    Social History Main Topics  . Smoking status: Never Smoker  . Smokeless tobacco: Never Used  . Alcohol use No  . Drug use: No  . Sexual activity: Not on file   Other Topics Concern  . Not on file   Social History  Narrative  . No narrative on file    Cindy Larson's family history includes Atrial fibrillation in her mother; Breast cancer in her maternal grandmother; Cancer in her mother; Colon cancer in her paternal aunt; Diabetes in her other; Heart disease in her mother; Heart failure in her brother; Hypertension in her mother; Kidney disease in her maternal aunt; Lung cancer in her brother; Ovarian cancer in her sister.      Objective:    Vitals:   04/26/17 1356  BP: 110/70  Pulse: 68    Physical Exam  well-developed older African-American female in no acute distress, pleasant accompanied by her daughter blood pressure 110/70 pulse 68, height 4 foot 11, weight 171, BMI of 34.5. HEENT ;nontraumatic normocephalic EOMI PERRLA sclera anicteric, Cardiovascular; regular rate and rhythm  with S1-S2 no murmur rub or gallop, Pulmonary; clear bilaterally, Abdomen; obese, soft, bowel sounds are present she is tender in the left lower quadrant there is no true guarding or rebound bowel sounds present, Rectal ;exam not done, Extremities ;no clubbing cyanosis or edema skin warm and dry, Neuropsych; mood and affect appropriate.       Assessment & Plan:   #30 71 year old female with acute recurrent diverticulitis. Patient having persistent pain 4 days after starting antibiotics, exam reassuring. #2 history of multiple adenomatous colon polyps and recent resection of a large sessile polyp with high-grade dysplasia. Follow-up colonoscopy 03/02/2017 including biopsies at the polypectomy scar site showed no evidence of residual adenomatous tissue. #3 GERD #4 diabetes mellitus #5 hypertension #6 obesity  Plan; check CBC with differential and BMET Full liquid to very soft diet over the next 2-3 days until symptoms improve Will continue Cipro 500 mg by mouth twice a day and Flagyl 500 mg by mouth twice a day the plan to extend course to 14 days and new prescription sent Patient has tramadol at home and will use  this every 6-8 hours when necessary for pain She is asked to call us in 48 hours and if she has not had any improvement will proceed with CT of the abdomen and pelvis with contrast. Patient will need follow-up colonoscopy with Dr. [name] July 2021.    Amy Genia Harold PA-C 04/26/2017   Cc: Biagio Borg, MD

## 2017-04-26 NOTE — Patient Instructions (Addendum)
Your physician has requested that you go to the basement for the following lab work before leaving today: BMET, CBC  We have sent the following medications to your pharmacy for you to pick up at your convenience: cipro 500 mg twice daily Flagyl 500 mg twice daily  Please stay on a full liquid to very soft diet over the next 48 hours.  Call our office on Wednesday with a progress report on how you are doing. If you are not feeling better at that time, we may need to get a CT scan of the abdomen.  If you are age 59 or older, your body mass index should be between 23-30. Your Body mass index is 34.56 kg/m. If this is out of the aforementioned range listed, please consider follow up with your Primary Care Provider.  If you are age 49 or younger, your body mass index should be between 19-25. Your Body mass index is 34.56 kg/m. If this is out of the aformentioned range listed, please consider follow up with your Primary Care Provider.

## 2017-04-26 NOTE — Progress Notes (Signed)
Assessment and plans reviewed  

## 2017-04-27 ENCOUNTER — Other Ambulatory Visit: Payer: Self-pay

## 2017-04-27 DIAGNOSIS — R159 Full incontinence of feces: Secondary | ICD-10-CM

## 2017-04-29 ENCOUNTER — Other Ambulatory Visit: Payer: Self-pay

## 2017-05-12 ENCOUNTER — Other Ambulatory Visit: Payer: Self-pay | Admitting: Internal Medicine

## 2017-06-10 ENCOUNTER — Ambulatory Visit: Payer: Medicare Other

## 2017-06-14 DIAGNOSIS — M25561 Pain in right knee: Secondary | ICD-10-CM | POA: Diagnosis not present

## 2017-06-17 ENCOUNTER — Ambulatory Visit (INDEPENDENT_AMBULATORY_CARE_PROVIDER_SITE_OTHER): Payer: Medicare Other | Admitting: General Practice

## 2017-06-17 DIAGNOSIS — Z23 Encounter for immunization: Secondary | ICD-10-CM | POA: Diagnosis not present

## 2017-07-12 ENCOUNTER — Other Ambulatory Visit: Payer: Self-pay | Admitting: Internal Medicine

## 2017-07-14 ENCOUNTER — Other Ambulatory Visit: Payer: Self-pay | Admitting: Internal Medicine

## 2017-07-21 ENCOUNTER — Other Ambulatory Visit: Payer: Self-pay | Admitting: Internal Medicine

## 2017-07-28 ENCOUNTER — Telehealth: Payer: Self-pay | Admitting: Internal Medicine

## 2017-07-28 MED ORDER — GLIMEPIRIDE 4 MG PO TABS: 4.0000 mg | ORAL_TABLET | Freq: Two times a day (BID) | ORAL | 0 refills | Status: DC

## 2017-07-28 NOTE — Telephone Encounter (Signed)
Pt called and notified refill would be sent to requested pharmacy, but an office visit would be needed for further refills. Last OV 08/04/16. Pt verbalized and appt made for 08/03/17.

## 2017-07-28 NOTE — Telephone Encounter (Signed)
Copied from [place] [id number]. Topic: Quick Communication - See Telephone Encounter >> Jul 28, 2017 10:19 AM Burnis Medin, NT wrote: CRM for notification. See Telephone encounter for: Pt. Called in about getting a refill on glimepiride (AMARYL) 4 MG tablet. Pt uses [facility] [address] in [facility]  07/28/17.

## 2017-08-03 ENCOUNTER — Ambulatory Visit (INDEPENDENT_AMBULATORY_CARE_PROVIDER_SITE_OTHER): Payer: Medicare Other | Admitting: Internal Medicine

## 2017-08-03 ENCOUNTER — Other Ambulatory Visit (INDEPENDENT_AMBULATORY_CARE_PROVIDER_SITE_OTHER): Payer: Medicare Other

## 2017-08-03 ENCOUNTER — Other Ambulatory Visit: Payer: Self-pay | Admitting: Internal Medicine

## 2017-08-03 ENCOUNTER — Encounter: Payer: Self-pay | Admitting: Internal Medicine

## 2017-08-03 VITALS — BP 122/82 | HR 85 | Temp 97.8°F | Ht 59.0 in | Wt 176.0 lb

## 2017-08-03 DIAGNOSIS — E119 Type 2 diabetes mellitus without complications: Secondary | ICD-10-CM

## 2017-08-03 DIAGNOSIS — Z0000 Encounter for general adult medical examination without abnormal findings: Secondary | ICD-10-CM | POA: Diagnosis not present

## 2017-08-03 DIAGNOSIS — I10 Essential (primary) hypertension: Secondary | ICD-10-CM

## 2017-08-03 LAB — URINALYSIS, ROUTINE W REFLEX MICROSCOPIC
Bilirubin Urine: NEGATIVE
Hgb urine dipstick: NEGATIVE
KETONES UR: NEGATIVE
LEUKOCYTES UA: NEGATIVE
Nitrite: NEGATIVE
PH: 5.5 (ref 5.0–8.0)
RBC / HPF: NONE SEEN (ref 0–?)
SPECIFIC GRAVITY, URINE: 1.025 (ref 1.000–1.030)
URINE GLUCOSE: NEGATIVE
UROBILINOGEN UA: 0.2 (ref 0.0–1.0)

## 2017-08-03 LAB — CBC WITH DIFFERENTIAL/PLATELET
BASOS PCT: 1.2 % (ref 0.0–3.0)
Basophils Absolute: 0.1 10*3/uL (ref 0.0–0.1)
EOS PCT: 2.1 % (ref 0.0–5.0)
Eosinophils Absolute: 0.2 10*3/uL (ref 0.0–0.7)
HCT: 36.7 % (ref 36.0–46.0)
Hemoglobin: 11.8 g/dL — ABNORMAL LOW (ref 12.0–15.0)
LYMPHS ABS: 3.7 10*3/uL (ref 0.7–4.0)
Lymphocytes Relative: 51.8 % — ABNORMAL HIGH (ref 12.0–46.0)
MCHC: 32.1 g/dL (ref 30.0–36.0)
MCV: 89.6 fl (ref 78.0–100.0)
MONO ABS: 0.5 10*3/uL (ref 0.1–1.0)
Monocytes Relative: 6.5 % (ref 3.0–12.0)
NEUTROS PCT: 38.4 % — AB (ref 43.0–77.0)
Neutro Abs: 2.7 10*3/uL (ref 1.4–7.7)
PLATELETS: 378 10*3/uL (ref 150.0–400.0)
RBC: 4.1 Mil/uL (ref 3.87–5.11)
RDW: 13.8 % (ref 11.5–15.5)
WBC: 7.1 10*3/uL (ref 4.0–10.5)

## 2017-08-03 LAB — HEPATIC FUNCTION PANEL
ALT: 14 U/L (ref 0–35)
AST: 15 U/L (ref 0–37)
Albumin: 4.3 g/dL (ref 3.5–5.2)
Alkaline Phosphatase: 61 U/L (ref 39–117)
BILIRUBIN TOTAL: 0.4 mg/dL (ref 0.2–1.2)
Bilirubin, Direct: 0.1 mg/dL (ref 0.0–0.3)
Total Protein: 7.3 g/dL (ref 6.0–8.3)

## 2017-08-03 LAB — LIPID PANEL
Cholesterol: 155 mg/dL (ref 0–200)
HDL: 45.3 mg/dL (ref >39.00)
LDL Cholesterol: 79 mg/dL (ref 0–99)
NONHDL: 109.84
Total CHOL/HDL Ratio: 3
Triglycerides: 155 mg/dL — ABNORMAL HIGH (ref 0.0–149.0)
VLDL: 31 mg/dL (ref 0.0–40.0)

## 2017-08-03 LAB — BASIC METABOLIC PANEL
BUN: 15 mg/dL (ref 6–23)
CO2: 29 meq/L (ref 19–32)
Calcium: 10 mg/dL (ref 8.4–10.5)
Chloride: 102 mEq/L (ref 96–112)
Creatinine, Ser: 0.9 mg/dL (ref 0.40–1.20)
GFR: 79.34 mL/min (ref >60.00)
GLUCOSE: 140 mg/dL — AB (ref 70–99)
POTASSIUM: 4.7 meq/L (ref 3.5–5.1)
SODIUM: 139 meq/L (ref 135–145)

## 2017-08-03 LAB — MICROALBUMIN / CREATININE URINE RATIO
CREATININE, U: 179.6 mg/dL
MICROALB UR: 5.7 mg/dL — AB (ref 0.0–1.9)
Microalb Creat Ratio: 3.2 mg/g (ref 0.0–30.0)

## 2017-08-03 LAB — TSH: TSH: 2.38 u[IU]/mL (ref 0.35–4.50)

## 2017-08-03 LAB — HEMOGLOBIN A1C: Hgb A1c MFr Bld: 7.4 % — ABNORMAL HIGH (ref 4.6–6.5)

## 2017-08-03 MED ORDER — OMEPRAZOLE 20 MG PO CPDR: 20.0000 mg | DELAYED_RELEASE_CAPSULE | Freq: Two times a day (BID) | ORAL | 3 refills | Status: DC

## 2017-08-03 MED ORDER — GABAPENTIN 300 MG PO CAPS: 300.0000 mg | ORAL_CAPSULE | Freq: Three times a day (TID) | ORAL | 5 refills | Status: DC

## 2017-08-03 MED ORDER — LISINOPRIL 5 MG PO TABS: 5.0000 mg | ORAL_TABLET | Freq: Every day | ORAL | 3 refills | Status: DC

## 2017-08-03 MED ORDER — PAROXETINE HCL 20 MG PO TABS: 20.0000 mg | ORAL_TABLET | Freq: Every day | ORAL | 3 refills | Status: DC

## 2017-08-03 MED ORDER — TRAMADOL HCL 50 MG PO TABS: 50.0000 mg | ORAL_TABLET | Freq: Four times a day (QID) | ORAL | 2 refills | Status: DC | PRN

## 2017-08-03 MED ORDER — METFORMIN HCL 500 MG PO TABS: ORAL_TABLET | ORAL | 3 refills | Status: DC

## 2017-08-03 MED ORDER — GLIMEPIRIDE 4 MG PO TABS: 4.0000 mg | ORAL_TABLET | Freq: Two times a day (BID) | ORAL | 3 refills | Status: DC

## 2017-08-03 MED ORDER — ALPRAZOLAM 0.25 MG PO TABS: 0.2500 mg | ORAL_TABLET | Freq: Two times a day (BID) | ORAL | 2 refills | Status: DC | PRN

## 2017-08-03 MED ORDER — ATORVASTATIN CALCIUM 20 MG PO TABS: 20.0000 mg | ORAL_TABLET | Freq: Every day | ORAL | 3 refills | Status: DC

## 2017-08-03 MED ORDER — LINAGLIPTIN 5 MG PO TABS: 5.0000 mg | ORAL_TABLET | Freq: Every day | ORAL | 3 refills | Status: DC

## 2017-08-03 NOTE — Progress Notes (Signed)
Subjective:    Patient ID: Cindy Larson, female    DOB: 03/23/1946, 71 y.o.   MRN: [id number]  HPI  Here for wellness and f/u;  Overall doing ok;  Pt denies Chest pain, worsening SOB, DOE, wheezing, orthopnea, PND, worsening LE edema, palpitations, dizziness or syncope.  Pt denies neurological change such as new headache, facial or extremity weakness.  Pt states overall good compliance with treatment and medications, good tolerability, and has been trying to follow appropriate diet.  Pt denies worsening depressive symptoms, suicidal ideation or panic. No fever, night sweats, wt loss, loss of appetite, or other constitutional symptoms.  Pt states good ability with ADL's, has low fall risk, home safety reviewed and adequate, no other significant changes in hearing or vision, and only occasionally active with exercise.  Pt denies polydipsia, polyuria, or low sugar symptoms such as weakness or confusion improved with po intake.  Pt states overall good compliance with meds, but is out of glimeparide x  2 wks  Also not taken lisinopril and lasix x 6 mo, and swelling seems overall stable.   Past Medical History:  Diagnosis Date  . ALLERGIC RHINITIS   . ANXIETY   . COLONIC POLYPS, HX OF   . DEPRESSION   . DIABETES MELLITUS, TYPE II   . Diverticulitis   . DIVERTICULOSIS, COLON   . Esophageal stricture   . Gallstones   . GERD   . HIATAL HERNIA   . History of blood transfusion    left knee replacement  . HYPERLIPIDEMIA   . Hypersomnolence   . HYPERTENSION   . INTERMITTENT VERTIGO   . OBESITY   . OSTEOARTHRITIS    L knee  . OSTEOPENIA   . Pneumonia   . Post-menopausal    hormone replacement therapy   Past Surgical History:  Procedure Laterality Date  . ABDOMINAL HYSTERECTOMY    . CESAREAN SECTION    . CHOLECYSTECTOMY    . Lysis of Adhesions    . OOPHORECTOMY     one  . ROTATOR CUFF REPAIR Left   . TOTAL KNEE ARTHROPLASTY Left     reports that  has never smoked. she has never  used smokeless tobacco. She reports that she does not drink alcohol or use drugs. family history includes Atrial fibrillation in her mother; Breast cancer in her maternal grandmother; Cancer in her mother; Colon cancer in her paternal aunt; Diabetes in her other; Heart disease in her mother; Heart failure in her brother; Hypertension in her mother; Kidney disease in her maternal aunt; Lung cancer in her brother; Ovarian cancer in her sister. Allergies  Allergen Reactions  . Aspirin Other (See Comments)    Stomach hurts  . Penicillins Hives    blisters  . Prevnar [Pneumococcal 13-Val Conj Vacc] Swelling    Left arm swelling  . Zocor [Simvastatin]     REACTION: itch   Current Outpatient Medications on File Prior to Visit  Medication Sig Dispense Refill  . aspirin EC 81 MG tablet Take 1 tablet (81 mg total) by mouth daily. 90 tablet 11  . Cholecalciferol (VITAMIN D) 2000 UNITS CAPS Take 2,000 Units by mouth daily.     . cyclobenzaprine (FLEXERIL) 5 MG tablet Take 1 tablet (5 mg total) by mouth 3 (three) times daily as needed for muscle spasms. 60 tablet 1  . meclizine (ANTIVERT) 25 MG tablet Take 25 mg by mouth every 6 (six) hours as needed for dizziness. Reported on 01/01/2016    .  ONE TOUCH ULTRA TEST test strip USE ONE TO CHECK BLOOD SUGARS DAILY 25 each 3  . ONETOUCH DELICA LANCETS 38G MISC Use to help check blood sugars daily. E11.9 100 each 1  . ONETOUCH DELICA LANCETS 66Z MISC USE AS DIRECTED. ONE EVERY DAY. 100 each 1  . OVER THE COUNTER MEDICATION Tumeric sport 500 mg daily po    . promethazine (PHENERGAN) 25 MG tablet TAKE ONE TABLET BY MOUTH EVERY 6 HOURS AS NEEDED FOR NAUSEA 40 tablet 0   No current facility-administered medications on file prior to visit.    Review of Systems  Constitutional: Negative for other unusual diaphoresis, sweats, appetite or weight changes HENT: Negative for other worsening hearing loss, ear pain, facial swelling, mouth sores or neck stiffness.   Eyes:  Negative for other worsening pain, redness or other visual disturbance.  Respiratory: Negative for other stridor or swelling Cardiovascular: Negative for other palpitations or other chest pain  Gastrointestinal: Negative for worsening diarrhea or loose stools, blood in stool, distention or other pain Genitourinary: Negative for hematuria, flank pain or other change in urine volume.  Musculoskeletal: Negative for myalgias or other joint swelling.  Skin: Negative for other color change, or other wound or worsening drainage.  Neurological: Negative for other syncope or numbness. Hematological: Negative for other adenopathy or swelling Psychiatric/Behavioral: Negative for hallucinations, other worsening agitation, SI, self-injury, or new decreased concentration All other system neg per pt    Objective:   Physical Exam BP 122/82   Pulse 85   Temp 97.8 F (36.6 C) (Oral)   Ht 4\' 11"  (1.499 m)   Wt 176 lb (79.8 kg)   SpO2 98%   BMI 35.55 kg/m  VS noted,  Constitutional: Pt is oriented to person, place, and time. Appears well-developed and well-nourished, in no significant distress and comfortable Head: Normocephalic and atraumatic  Eyes: Conjunctivae and EOM are normal. Pupils are equal, round, and reactive to light Right Ear: External ear normal without discharge Left Ear: External ear normal without discharge Nose: Nose without discharge or deformity Mouth/Throat: Oropharynx is without other ulcerations and moist  Neck: Normal range of motion. Neck supple. No JVD present. No tracheal deviation present or significant neck LA or mass Cardiovascular: Normal rate, regular rhythm, normal heart sounds and intact distal pulses.   Pulmonary/Chest: WOB normal and breath sounds without rales or wheezing  Abdominal: Soft. Bowel sounds are normal. NT. No HSM  Musculoskeletal: Normal range of motion. Exhibits no edema Lymphadenopathy: Has no other cervical adenopathy.  Neurological: Pt is alert  and oriented to person, place, and time. Pt has normal reflexes. No cranial nerve deficit. Motor grossly intact, Gait intact Skin: Skin is warm and dry. No rash noted or new ulcerations Psychiatric:  Has normal mood and affect. Behavior is normal without agitation All other system neg per pt     Assessment & Plan:

## 2017-08-03 NOTE — Patient Instructions (Addendum)
Ok to hold on taking the fluid pill  OK to decrease the lisinopril to 5 mg per day  Please continue all other medications as before, and refills have been done if requested.  Please have the pharmacy call with any other refills you may need.  Please continue your efforts at being more active, low cholesterol diet, and weight control.  You are otherwise up to date with prevention measures today.  Please keep your appointments with your specialists as you may have planned  Please go to the LAB in the Basement (turn left off the elevator) for the tests to be done today  You will be contacted by phone if any changes need to be made immediately.  Otherwise, you will receive a letter about your results with an explanation, but please check with MyChart first.  Please remember to sign up for MyChart if you have not done so, as this will be important to you in the future with finding out test results, communicating by private email, and scheduling acute appointments online when needed.  Please return in 6 months, or sooner if needed, with Lab testing done 3-5 days before

## 2017-08-04 ENCOUNTER — Telehealth: Payer: Self-pay

## 2017-08-04 NOTE — Telephone Encounter (Signed)
Called pt, reviewed results, she expressed understanding

## 2017-08-05 NOTE — Assessment & Plan Note (Signed)
stable overall by history and exam, recent data reviewed with pt, and pt to continue medical treatment as before,  to f/u any worsening symptoms or concerns, for f/u a1c with labs 

## 2017-08-05 NOTE — Assessment & Plan Note (Signed)

## 2017-08-05 NOTE — Assessment & Plan Note (Signed)
Ok for restart lisinopril lower dose 5 mg daily for renoprotective

## 2017-08-12 ENCOUNTER — Telehealth: Payer: Self-pay | Admitting: Internal Medicine

## 2017-08-12 NOTE — Telephone Encounter (Signed)
Copied from [place]. Topic: Quick Communication - See Telephone Encounter >> Aug 12, 2017  2:29 PM Corie Chiquito, [place] wrote: CRM for notification. See Telephone encounter for: Patient called because her Wann to much and would like to know if there was a different medication that she could be put on. If someone could please give her a call back about this matter at [phone]  08/12/17.

## 2017-08-12 NOTE — Telephone Encounter (Signed)
See note

## 2017-08-12 NOTE — Telephone Encounter (Signed)
Pt should contact her insurance company and see what alternatives they cover that would be more cost effective for her then get back with [place]. Thanks!

## 2017-08-13 NOTE — Telephone Encounter (Signed)
Called pt and asked her to call insurance company to see about cheaper alternatives to [place]. Pt states will call back with that information.

## 2017-08-20 ENCOUNTER — Other Ambulatory Visit: Payer: Self-pay | Admitting: Internal Medicine

## 2017-08-20 DIAGNOSIS — Z1231 Encounter for screening mammogram for malignant neoplasm of breast: Secondary | ICD-10-CM

## 2017-09-24 ENCOUNTER — Ambulatory Visit
Admission: RE | Admit: 2017-09-24 | Discharge: 2017-09-24 | Disposition: A | Discharge status: Home / Self Care | Payer: Medicare Other | Source: Ambulatory Visit | Attending: Internal Medicine | Admitting: Internal Medicine

## 2017-09-24 DIAGNOSIS — Z1231 Encounter for screening mammogram for malignant neoplasm of breast: Secondary | ICD-10-CM

## 2017-09-29 ENCOUNTER — Other Ambulatory Visit: Payer: Self-pay | Admitting: Internal Medicine

## 2017-11-06 ENCOUNTER — Other Ambulatory Visit: Payer: Self-pay | Admitting: Internal Medicine

## 2017-11-22 DIAGNOSIS — M25561 Pain in right knee: Secondary | ICD-10-CM | POA: Diagnosis not present

## 2017-12-13 DIAGNOSIS — M25561 Pain in right knee: Secondary | ICD-10-CM | POA: Diagnosis not present

## 2017-12-28 DIAGNOSIS — M1711 Unilateral primary osteoarthritis, right knee: Secondary | ICD-10-CM | POA: Diagnosis not present

## 2018-01-04 DIAGNOSIS — M1711 Unilateral primary osteoarthritis, right knee: Secondary | ICD-10-CM | POA: Diagnosis not present

## 2018-01-11 DIAGNOSIS — M1711 Unilateral primary osteoarthritis, right knee: Secondary | ICD-10-CM | POA: Diagnosis not present

## 2018-02-01 ENCOUNTER — Ambulatory Visit: Payer: Medicare Other | Admitting: Internal Medicine

## 2018-02-01 DIAGNOSIS — Z0289 Encounter for other administrative examinations: Secondary | ICD-10-CM

## 2018-02-10 DIAGNOSIS — H40033 Anatomical narrow angle, bilateral: Secondary | ICD-10-CM | POA: Diagnosis not present

## 2018-02-14 ENCOUNTER — Encounter: Payer: Self-pay | Admitting: Internal Medicine

## 2018-02-14 ENCOUNTER — Ambulatory Visit (INDEPENDENT_AMBULATORY_CARE_PROVIDER_SITE_OTHER): Payer: Medicare Other | Admitting: Internal Medicine

## 2018-02-14 ENCOUNTER — Other Ambulatory Visit: Payer: Self-pay | Admitting: Internal Medicine

## 2018-02-14 ENCOUNTER — Other Ambulatory Visit (INDEPENDENT_AMBULATORY_CARE_PROVIDER_SITE_OTHER): Payer: Medicare Other

## 2018-02-14 ENCOUNTER — Telehealth: Payer: Self-pay

## 2018-02-14 VITALS — BP 122/84 | HR 99 | Temp 97.9°F | Ht 59.0 in | Wt 165.0 lb

## 2018-02-14 DIAGNOSIS — E119 Type 2 diabetes mellitus without complications: Secondary | ICD-10-CM

## 2018-02-14 DIAGNOSIS — J069 Acute upper respiratory infection, unspecified: Secondary | ICD-10-CM | POA: Diagnosis not present

## 2018-02-14 DIAGNOSIS — E785 Hyperlipidemia, unspecified: Secondary | ICD-10-CM

## 2018-02-14 DIAGNOSIS — I10 Essential (primary) hypertension: Secondary | ICD-10-CM | POA: Diagnosis not present

## 2018-02-14 DIAGNOSIS — N289 Disorder of kidney and ureter, unspecified: Secondary | ICD-10-CM

## 2018-02-14 LAB — BASIC METABOLIC PANEL
BUN: 23 mg/dL (ref 6–23)
CALCIUM: 9.9 mg/dL (ref 8.4–10.5)
CO2: 26 mEq/L (ref 19–32)
Chloride: 103 mEq/L (ref 96–112)
Creatinine, Ser: 1.74 mg/dL — ABNORMAL HIGH (ref 0.40–1.20)
GFR: 37.02 mL/min — AB (ref >60.00)
GLUCOSE: 189 mg/dL — AB (ref 70–99)
Potassium: 4.3 mEq/L (ref 3.5–5.1)
Sodium: 141 mEq/L (ref 135–145)

## 2018-02-14 LAB — HEPATIC FUNCTION PANEL
ALK PHOS: 55 U/L (ref 39–117)
ALT: 17 U/L (ref 0–35)
AST: 14 U/L (ref 0–37)
Albumin: 4.4 g/dL (ref 3.5–5.2)
BILIRUBIN DIRECT: 0.1 mg/dL (ref 0.0–0.3)
BILIRUBIN TOTAL: 0.5 mg/dL (ref 0.2–1.2)
Total Protein: 7.4 g/dL (ref 6.0–8.3)

## 2018-02-14 LAB — LIPID PANEL
Cholesterol: 159 mg/dL (ref 0–200)
HDL: 47.7 mg/dL (ref >39.00)
LDL Cholesterol: 82 mg/dL (ref 0–99)
NONHDL: 110.9
Total CHOL/HDL Ratio: 3
Triglycerides: 144 mg/dL (ref 0.0–149.0)
VLDL: 28.8 mg/dL (ref 0.0–40.0)

## 2018-02-14 LAB — HEMOGLOBIN A1C: HEMOGLOBIN A1C: 6.9 % — AB (ref 4.6–6.5)

## 2018-02-14 MED ORDER — HYDROCODONE-HOMATROPINE 5-1.5 MG/5ML PO SYRP: 5.0000 mL | ORAL_SOLUTION | Freq: Four times a day (QID) | ORAL | 0 refills | Status: AC | PRN

## 2018-02-14 MED ORDER — AZITHROMYCIN 250 MG PO TABS: ORAL_TABLET | ORAL | 1 refills | Status: DC

## 2018-02-14 NOTE — Assessment & Plan Note (Signed)
Mild to mod, for antibx course,  to f/u any worsening symptoms or concerns 

## 2018-02-14 NOTE — Patient Instructions (Signed)
.  Please take all new medication as prescribed - the antibiotic, and cough medicine as needed  Please continue all other medications as before, and refills have been done if requested.  Please have the pharmacy call with any other refills you may need.  Please continue your efforts at being more active, low cholesterol diabetic diet, and weight control  Please keep your appointments with your specialists as you may have planned  Please go to the LAB in the Basement (turn left off the elevator) for the tests to be done today  You will be contacted by phone if any changes need to be made immediately.  Otherwise, you will receive a letter about your results with an explanation, but please check with MyChart first.  Please remember to sign up for MyChart if you have not done so, as this will be important to you in the future with finding out test results, communicating by private email, and scheduling acute appointments online when needed.  Please return in 6 months, or sooner if needed, with Lab testing done 3-5 days before

## 2018-02-14 NOTE — Telephone Encounter (Signed)
Pt has been informed and expressed understanding.  

## 2018-02-14 NOTE — Telephone Encounter (Signed)
-----   Message from Biagio Borg, MD sent at 02/14/2018  1:22 PM EDT ----- Left message on MyChart, pt to cont same tx except  The test results show that your current treatment is OK, except the kidney function appears to be mildly slowed.  This can be due medication such as lasix,  Please hold the lasix for 4 days, and repeat your Blood Testing at the Lab only on Friday Jun 21.  Cindy Larson to please inform pt, I will place order

## 2018-02-14 NOTE — Assessment & Plan Note (Signed)
With increased a1c last visit, never started the tradjenta due to cost, ok for f/u lab today

## 2018-02-14 NOTE — Assessment & Plan Note (Signed)
stable overall by history and exam, recent data reviewed with pt, and pt to continue medical treatment as before,  to f/u any worsening symptoms or concerns BP Readings from Last 3 Encounters:  02/14/18 122/84  08/03/17 122/82  04/26/17 110/70

## 2018-02-14 NOTE — Progress Notes (Signed)
Subjective:    Patient ID: Cindy Larson, female    DOB: Nov 02, 1945, 72 y.o.   MRN: [id number]  HPI   Here with 2-3 days acute onset fever, facial pain, pressure, headache, general weakness and malaise, and greenish d/c, with mild ST and cough, but pt denies chest pain, wheezing, increased sob or doe, orthopnea, PND, increased LE swelling, palpitations, dizziness or syncope.  Pt denies new neurological symptoms such as new headache, or facial or extremity weakness or numbness   Pt denies polydipsia, polyuria, or low sugar symptoms such as weakness or confusion improved with po intake.  Pt states overall good compliance with meds, trying to follow lower cholesterol, diabetic diet, wt down 10 lbs with getting more exercise BP Readings from Last 3 Encounters:  02/14/18 122/84  08/03/17 122/82  04/26/17 110/70   Wt Readings from Last 3 Encounters:  02/14/18 165 lb (74.8 kg)  08/03/17 176 lb (79.8 kg)  04/26/17 171 lb 2 oz (77.6 kg)   Past Medical History:  Diagnosis Date  . ALLERGIC RHINITIS   . ANXIETY   . COLONIC POLYPS, HX OF   . DEPRESSION   . DIABETES MELLITUS, TYPE II   . Diverticulitis   . DIVERTICULOSIS, COLON   . Esophageal stricture   . Gallstones   . GERD   . HIATAL HERNIA   . History of blood transfusion    left knee replacement  . HYPERLIPIDEMIA   . Hypersomnolence   . HYPERTENSION   . INTERMITTENT VERTIGO   . OBESITY   . OSTEOARTHRITIS    L knee  . OSTEOPENIA   . Pneumonia   . Post-menopausal    hormone replacement therapy   Past Surgical History:  Procedure Laterality Date  . ABDOMINAL HYSTERECTOMY    . CESAREAN SECTION    . CHOLECYSTECTOMY    . Lysis of Adhesions    . OOPHORECTOMY     one  . ROTATOR CUFF REPAIR Left   . TOTAL KNEE ARTHROPLASTY Left     reports that she has never smoked. She has never used smokeless tobacco. She reports that she does not drink alcohol or use drugs. family history includes Atrial fibrillation in her mother;  Breast cancer in her maternal grandmother; Cancer in her mother; Colon cancer in her paternal aunt; Diabetes in her other; Heart disease in her mother; Heart failure in her brother; Hypertension in her mother; Kidney disease in her maternal aunt; Lung cancer in her brother; Ovarian cancer in her sister. Allergies  Allergen Reactions  . Aspirin Other (See Comments)    Stomach hurts  . Penicillins Hives    blisters  . Prevnar [Pneumococcal 13-Val Conj Vacc] Swelling    Left arm swelling  . Zocor [Simvastatin]     REACTION: itch   Current Outpatient Medications on File Prior to Visit  Medication Sig Dispense Refill  . ALPRAZolam (XANAX) 0.25 MG tablet Take 1 tablet (0.25 mg total) by mouth 2 (two) times daily as needed. for anxiety 60 tablet 2  . aspirin EC 81 MG tablet Take 1 tablet (81 mg total) by mouth daily. 90 tablet 11  . atorvastatin (LIPITOR) 20 MG tablet Take 1 tablet (20 mg total) by mouth daily. 90 tablet 3  . Cholecalciferol (VITAMIN D) 2000 UNITS CAPS Take 2,000 Units by mouth daily.     . cyclobenzaprine (FLEXERIL) 5 MG tablet TAKE ONE TABLET BY MOUTH THREE TIMES DAILY AS NEEDED FOR MUSCLE SPASM 60 tablet 1  . furosemide (LASIX)  40 MG tablet TAKE 1 TABLET BY MOUTH ONCE DAILY 90 tablet 2  . gabapentin (NEURONTIN) 300 MG capsule Take 1 capsule (300 mg total) by mouth 3 (three) times daily. Start with 1 capsule at bedtime then advanced to one capsule twice a day and if need be increased to 1 capsule 3 times a day 90 capsule 5  . glimepiride (AMARYL) 4 MG tablet Take 1 tablet (4 mg total) by mouth 2 (two) times daily. 180 tablet 3  . linagliptin (TRADJENTA) 5 MG TABS tablet Take 1 tablet (5 mg total) by mouth daily. 90 tablet 3  . lisinopril (PRINIVIL,ZESTRIL) 20 MG tablet TAKE 1 TABLET BY MOUTH ONCE DAILY 90 tablet 2  . lisinopril (PRINIVIL,ZESTRIL) 5 MG tablet Take 1 tablet (5 mg total) by mouth daily. 90 tablet 3  . meclizine (ANTIVERT) 25 MG tablet Take 25 mg by mouth every 6  (six) hours as needed for dizziness. Reported on 01/01/2016    . metFORMIN (GLUCOPHAGE) 500 MG tablet TAKE TWO TABLETS BY MOUTH TWICE DAILY WITH  A  MEAL 360 tablet 3  . omeprazole (PRILOSEC) 20 MG capsule Take 1 capsule (20 mg total) by mouth 2 (two) times daily before a meal. 180 capsule 3  . ONE TOUCH ULTRA TEST test strip USE ONE TO CHECK BLOOD SUGARS DAILY 25 each 3  . ONETOUCH DELICA LANCETS 98X MISC Use to help check blood sugars daily. E11.9 100 each 1  . OVER THE COUNTER MEDICATION Tumeric sport 500 mg daily po    . PARoxetine (PAXIL) 20 MG tablet Take 1 tablet (20 mg total) by mouth daily. 90 tablet 3  . promethazine (PHENERGAN) 25 MG tablet TAKE ONE TABLET BY MOUTH EVERY 6 HOURS AS NEEDED FOR NAUSEA 40 tablet 0  . traMADol (ULTRAM) 50 MG tablet Take 1 tablet (50 mg total) by mouth every 6 (six) hours as needed. 60 tablet 2   No current facility-administered medications on file prior to visit.    Review of Systems  Constitutional: Negative for other unusual diaphoresis or sweats HENT: Negative for ear discharge or swelling Eyes: Negative for other worsening visual disturbances Respiratory: Negative for stridor or other swelling  Gastrointestinal: Negative for worsening distension or other blood Genitourinary: Negative for retention or other urinary change Musculoskeletal: Negative for other MSK pain or swelling Skin: Negative for color change or other new lesions Neurological: Negative for worsening tremors and other numbness  Psychiatric/Behavioral: Negative for worsening agitation or other fatigue All other system neg per pt    Objective:   Physical Exam BP 122/84   Pulse 99   Temp 97.9 F (36.6 C) (Oral)   Ht 4\' 11"  (1.499 m)   Wt 165 lb (74.8 kg)   SpO2 100%   BMI 33.33 kg/m  VS noted, mild ill Constitutional: Pt appears in NAD HENT: Head: NCAT.  Right Ear: External ear normal.  Left Ear: External ear normal.  Bilat tm's with mild erythema.  Max sinus areas mild  tender.  Pharynx with mild erythema, no exudate Eyes: . Pupils are equal, round, and reactive to light. Conjunctivae and EOM are normal Nose: without d/c or deformity Neck: Neck supple. Gross normal ROM Cardiovascular: Normal rate and regular rhythm.   Pulmonary/Chest: Effort normal and breath sounds decreased without rales or wheezing.  Abd:  Soft, NT, ND, + BS, no organomegaly Neurological: Pt is alert. At baseline orientation, motor grossly intact Skin: Skin is warm. No rashes, other new lesions, no LE edema Psychiatric: Pt  behavior is normal without agitation  No other exam findings  Lab Results  Component Value Date   WBC 7.1 08/03/2017   HGB 11.8 (L) 08/03/2017   HCT 36.7 08/03/2017   PLT 378.0 08/03/2017   GLUCOSE 140 (H) 08/03/2017   CHOL 155 08/03/2017   TRIG 155.0 (H) 08/03/2017   HDL 45.30 08/03/2017   LDLDIRECT 82.3 06/15/2013   LDLCALC 79 08/03/2017   ALT 14 08/03/2017   AST 15 08/03/2017   NA 139 08/03/2017   K 4.7 08/03/2017   CL 102 08/03/2017   CREATININE 0.90 08/03/2017   BUN 15 08/03/2017   CO2 29 08/03/2017   TSH 2.38 08/03/2017   HGBA1C 7.4 (H) 08/03/2017   MICROALBUR 5.7 (H) 08/03/2017      Assessment & Plan:

## 2018-02-14 NOTE — Assessment & Plan Note (Signed)
stable overall by history and exam, recent data reviewed with pt, and pt to continue medical treatment as before,  to f/u any worsening symptoms or concerns Lab Results  Component Value Date   LDLCALC 79 08/03/2017

## 2018-02-15 ENCOUNTER — Telehealth: Payer: Self-pay | Admitting: Emergency Medicine

## 2018-02-15 NOTE — Telephone Encounter (Signed)
Called patient to schedule AWV. Pt declined at this time.

## 2018-02-18 ENCOUNTER — Other Ambulatory Visit (INDEPENDENT_AMBULATORY_CARE_PROVIDER_SITE_OTHER): Payer: Medicare Other

## 2018-02-18 DIAGNOSIS — N289 Disorder of kidney and ureter, unspecified: Secondary | ICD-10-CM

## 2018-02-18 LAB — BASIC METABOLIC PANEL
BUN: 19 mg/dL (ref 6–23)
CHLORIDE: 105 meq/L (ref 96–112)
CO2: 23 meq/L (ref 19–32)
Calcium: 9.8 mg/dL (ref 8.4–10.5)
Creatinine, Ser: 1.44 mg/dL — ABNORMAL HIGH (ref 0.40–1.20)
GFR: 46.05 mL/min — ABNORMAL LOW (ref >60.00)
GLUCOSE: 324 mg/dL — AB (ref 70–99)
POTASSIUM: 4.8 meq/L (ref 3.5–5.1)
Sodium: 139 mEq/L (ref 135–145)

## 2018-03-19 ENCOUNTER — Other Ambulatory Visit: Payer: Self-pay | Admitting: Internal Medicine

## 2018-03-21 NOTE — Telephone Encounter (Signed)
08/03/2017 60# 

## 2018-03-21 NOTE — Telephone Encounter (Signed)
Should still have refill on her rx

## 2018-04-14 ENCOUNTER — Telehealth: Payer: Self-pay | Admitting: Internal Medicine

## 2018-04-14 ENCOUNTER — Other Ambulatory Visit: Payer: Self-pay

## 2018-04-14 MED ORDER — CIPROFLOXACIN HCL 500 MG PO TABS: 500.0000 mg | ORAL_TABLET | Freq: Two times a day (BID) | ORAL | 0 refills | Status: DC

## 2018-04-14 MED ORDER — METRONIDAZOLE 500 MG PO TABS: 500.0000 mg | ORAL_TABLET | Freq: Two times a day (BID) | ORAL | 0 refills | Status: DC

## 2018-04-14 NOTE — Telephone Encounter (Signed)
Patient describes a "nagging" discomfort in the LLQ area that started on Monday. She now has LLQ pain that hurt when she walks or has to bear down for her bowel movement. States this is "exactly what it feels like when I need antibiotics for my diverticulosis." Denies bloody stools or diarrhea. Reports normal bowel movements. Please advise.

## 2018-04-14 NOTE — Telephone Encounter (Signed)
1. Low residue diet 2. Cipro 500 mg twice a day for 10 days 3. Metronidazole 500 mg twice a day for 10 days 4. Office follow-up with myself or an advanced practitioner in one week 5. If problems worsen in the interim, proceed to the emergency room for evaluation

## 2018-04-14 NOTE — Telephone Encounter (Signed)
Patient is advised. She agrees with this plan of care.

## 2018-04-29 ENCOUNTER — Encounter: Payer: Self-pay | Admitting: Physician Assistant

## 2018-04-29 ENCOUNTER — Ambulatory Visit: Payer: Medicare Other | Admitting: Physician Assistant

## 2018-04-29 VITALS — BP 130/88 | HR 98 | Ht 59.0 in | Wt 165.3 lb

## 2018-04-29 DIAGNOSIS — Z86010 Personal history of colonic polyps: Secondary | ICD-10-CM

## 2018-04-29 DIAGNOSIS — B370 Candidal stomatitis: Secondary | ICD-10-CM | POA: Diagnosis not present

## 2018-04-29 DIAGNOSIS — K5732 Diverticulitis of large intestine without perforation or abscess without bleeding: Secondary | ICD-10-CM | POA: Diagnosis not present

## 2018-04-29 MED ORDER — NYSTATIN 100000 UNIT/ML MT SUSP: OROMUCOSAL | 0 refills | Status: DC

## 2018-04-29 NOTE — Progress Notes (Signed)
Assessment and plans reviewed  

## 2018-04-29 NOTE — Patient Instructions (Addendum)
Drink 60 ounces of water daily. High fiber diet. Call for problems with recurrent pain.  We sent a prescription for nystatin ( mycostatin) suspension. You will take 5 cc's by mouth and swish and swallow.   You will need a follow up colonoscopy July of 2021. You will get a letter from [place] prior to that to call us.   Normal BMI (Body Mass Index- based on height and weight) is between 23 and 30. Your BMI today is Body mass index is 33.39 kg/m. Marland Kitchen Please consider follow up  regarding your BMI with your Primary Care Provider.

## 2018-04-29 NOTE — Progress Notes (Signed)
Subjective:    Patient ID: Cindy Larson, female    DOB: 1946/05/18, 72 y.o.   MRN: [id number]  HPI [name] is a pleasant 72 year old African-American female, known to Dr. Scarlette Shorts and myself.  She was last seen in the office about a year ago with an episode of diverticulitis.  She had had colonoscopy in July 2018 with finding of 2 small polyps, the largest 4 mm both of which were removed and were tubular adenomas.  Also with right and left-sided diverticulosis and internal hemorrhoids.  She is indicated for 3-year interval follow-up with pediatric scope. Patient also with hypertension, GERD, adult onset diabetes mellitus. She had called the office about 2 weeks ago with complaints of recurrent left lower quadrant pain.  She was called in a course of Cipro and Flagyl for 10 days comes in today for follow-up. She has completed the antibiotics and says she is feeling better.  She had been having pain which has resolved.  She says she has a little bit of residual tenderness but no pain.  She had been a bit constipated prior to onset of her current symptoms, says her bowels are moving regularly now.  She did have some hot and cold spells at onset of her illness which is also resolved.  She says she is been very stressed lately as well and wonders if this may have aggravated things.  Her brother is very ill and is currently on hospice. Also mentions that she has a terrible taste in her mouth since taking the antibiotics.  Review of Systems Pertinent positive and negative review of systems were noted in the above HPI section.  All other review of systems was otherwise negative.  Outpatient Encounter Medications as of 04/29/2018  Medication Sig  . ALPRAZolam (XANAX) 0.25 MG tablet Take 1 tablet (0.25 mg total) by mouth 2 (two) times daily as needed. for anxiety  . aspirin EC 81 MG tablet Take 1 tablet (81 mg total) by mouth daily.  Marland Kitchen atorvastatin (LIPITOR) 20 MG tablet Take 1 tablet (20 mg total) by  mouth daily.  . Cholecalciferol (VITAMIN D) 2000 UNITS CAPS Take 2,000 Units by mouth daily.   . cyclobenzaprine (FLEXERIL) 5 MG tablet TAKE ONE TABLET BY MOUTH THREE TIMES DAILY AS NEEDED FOR MUSCLE SPASM  . glimepiride (AMARYL) 4 MG tablet Take 1 tablet (4 mg total) by mouth 2 (two) times daily.  . meclizine (ANTIVERT) 25 MG tablet Take 25 mg by mouth every 6 (six) hours as needed for dizziness. Reported on 01/01/2016  . metFORMIN (GLUCOPHAGE) 500 MG tablet TAKE TWO TABLETS BY MOUTH TWICE DAILY WITH  A  MEAL  . omeprazole (PRILOSEC) 20 MG capsule Take 1 capsule (20 mg total) by mouth 2 (two) times daily before a meal.  . PARoxetine (PAXIL) 20 MG tablet Take 1 tablet (20 mg total) by mouth daily.  . promethazine (PHENERGAN) 25 MG tablet TAKE ONE TABLET BY MOUTH EVERY 6 HOURS AS NEEDED FOR NAUSEA  . gabapentin (NEURONTIN) 300 MG capsule Take 1 capsule (300 mg total) by mouth 3 (three) times daily. Start with 1 capsule at bedtime then advanced to one capsule twice a day and if need be increased to 1 capsule 3 times a day (Patient not taking: Reported on 04/29/2018)  . lisinopril (PRINIVIL,ZESTRIL) 20 MG tablet TAKE 1 TABLET BY MOUTH ONCE DAILY (Patient not taking: Reported on 04/29/2018)  . lisinopril (PRINIVIL,ZESTRIL) 5 MG tablet Take 1 tablet (5 mg total) by mouth daily. (Patient  not taking: Reported on 04/29/2018)  . nystatin (MYCOSTATIN) 100000 UNIT/ML suspension Take 5 cc's by mouth, swish and swallow 4 times daily x 2 weeks.  . ONE TOUCH ULTRA TEST test strip USE ONE TO CHECK BLOOD SUGARS DAILY (Patient not taking: Reported on 04/29/2018)  . ONETOUCH DELICA LANCETS 32I MISC Use to help check blood sugars daily. E11.9 (Patient not taking: Reported on 04/29/2018)  . traMADol (ULTRAM) 50 MG tablet Take 1 tablet (50 mg total) by mouth every 6 (six) hours as needed. (Patient not taking: Reported on 04/29/2018)  . [DISCONTINUED] azithromycin (ZITHROMAX Z-PAK) 250 MG tablet 2 tab by mouth day 1, then 1 per  day  . [DISCONTINUED] ciprofloxacin (CIPRO) 500 MG tablet Take 1 tablet (500 mg total) by mouth 2 (two) times daily.  . [DISCONTINUED] furosemide (LASIX) 40 MG tablet TAKE 1 TABLET BY MOUTH ONCE DAILY  . [DISCONTINUED] metroNIDAZOLE (FLAGYL) 500 MG tablet Take 1 tablet (500 mg total) by mouth 2 (two) times daily.  . [DISCONTINUED] OVER THE COUNTER MEDICATION Tumeric sport 500 mg daily po   No facility-administered encounter medications on file as of 04/29/2018.    Allergies  Allergen Reactions  . Aspirin Other (See Comments)    Stomach hurts  . Penicillins Hives    blisters  . Prevnar [Pneumococcal 13-Val Conj Vacc] Swelling    Left arm swelling  . Zocor [Simvastatin]     REACTION: itch   Patient Active Problem List   Diagnosis Date Noted  . Acute upper respiratory infection 02/14/2018  . Bilateral shoulder pain 01/31/2016  . Chest pain 04/18/2015  . Left arm pain 07/25/2013  . Neck pain on left side 06/15/2013  . LLQ abdominal pain 10/11/2012  . Back pain 03/03/2012  . Right upper lobe pneumonia ([place]) 02/16/2012  . Preventative health care 05/21/2011  . ABDOMINAL PAIN OTHER SPECIFIED SITE 10/24/2010  . INTERMITTENT VERTIGO 08/07/2009  . Dehydration 01/11/2009  . OSTEOPENIA 04/09/2008  . PARESTHESIA 03/08/2008  . Diabetes ([place]) 07/03/2007  . Hyperlipidemia 07/03/2007  . OBESITY 07/03/2007  . ANXIETY 07/03/2007  . DEPRESSION 07/03/2007  . Essential hypertension 07/03/2007  . ALLERGIC RHINITIS 07/03/2007  . GERD 07/03/2007  . HIATAL HERNIA 07/03/2007  . DIVERTICULOSIS, COLON 07/03/2007  . OSTEOARTHRITIS 07/03/2007  . History of colonic polyps 07/03/2007   Social History   Socioeconomic History  . Marital status: Married    Spouse name: Not on file  . Number of children: 3  . Years of education: Not on file  . Highest education level: Not on file  Occupational History  . Occupation: Control and instrumentation engineer  Social Needs  . Financial resource strain: Not on file  . Food  insecurity:    Worry: Not on file    Inability: Not on file  . Transportation needs:    Medical: Not on file    Non-medical: Not on file  Tobacco Use  . Smoking status: Never Smoker  . Smokeless tobacco: Never Used  Substance and Sexual Activity  . Alcohol use: No  . Drug use: No  . Sexual activity: Not on file  Lifestyle  . Physical activity:    Days per week: Not on file    Minutes per session: Not on file  . Stress: Not on file  Relationships  . Social connections:    Talks on phone: Not on file    Gets together: Not on file    Attends religious service: Not on file    Active member of club or organization:  Not on file    Attends meetings of clubs or organizations: Not on file    Relationship status: Not on file  . Intimate partner violence:    Fear of current or ex partner: Not on file    Emotionally abused: Not on file    Physically abused: Not on file    Forced sexual activity: Not on file  Other Topics Concern  . Not on file  Social History Narrative  . Not on file    Ms. Grudzien's family history includes Atrial fibrillation in her mother; Breast cancer in her maternal grandmother; Cancer in her mother; Colon cancer in her paternal aunt; Diabetes in her other; Heart disease in her mother; Heart failure in her brother; Hypertension in her mother; Kidney disease in her maternal aunt; Lung cancer in her brother; Ovarian cancer in her sister.      Objective:    Vitals:   04/29/18 0902  BP: 130/88  Pulse: 98    Physical Exam; well-developed older African-American female in no acute distress, pleasant blood pressure 130/88 pulse 98, height 4 foot 11, weight 165, BMI 33.3.  HEENT; nontraumatic normocephalic, EOMI ,PERRLA ,sclera anicteric, buccal mucosa moist, tongue with thick white coating Cardiovascular; regular rate and rhythm with S1-S2 no murmur rub or gallop, Pulmonary ;clear bilaterally, Abdomen soft, nondistended bowel sounds are active there is no palpable  mass or hepatosplenomegaly she has some very minimal tenderness in the left lower quadrant no guarding or rebound, Rectal; exam not done, Extremities ;no clubbing cyanosis or edema skin warm and dry, Neuro psych ;alert and oriented, grossly nonfocal mood and affect appropriate       Assessment & Plan:   #48 72 year old African-American female with recurrent sigmoid diverticulitis Symptoms have resolved since completing recent course of Cipro and Flagyl. #2 history of tubular adenomatous colon polyps-due for follow-up colonoscopy July 2021 #3.  Oral thrush-post recent antibiotics #4 adult onset diabetes mellitus #5.  Hypertension #6.  GERD  Plan; Will give her a 2-week course of Mycostatin oral suspension 5 cc 4 times daily swish and swallow for thrush. She is asked to call if she has any recurrence of abdominal pain. We discussed liberal water intake, high-fiber diet and use of fiber supplement in addition to Senokot or milk of magnesia which she uses on a as needed basis. She will be due for recall colonoscopy July 2021.  Laneya Gasaway S Fareeha Evon PA-C 04/29/2018   Cc: Biagio Borg, MD

## 2018-05-09 ENCOUNTER — Other Ambulatory Visit (INDEPENDENT_AMBULATORY_CARE_PROVIDER_SITE_OTHER): Payer: Medicare Other

## 2018-05-09 ENCOUNTER — Other Ambulatory Visit: Payer: Self-pay

## 2018-05-09 ENCOUNTER — Telehealth: Payer: Self-pay | Admitting: Physician Assistant

## 2018-05-09 DIAGNOSIS — R1031 Right lower quadrant pain: Secondary | ICD-10-CM | POA: Diagnosis not present

## 2018-05-09 LAB — COMPREHENSIVE METABOLIC PANEL
ALK PHOS: 53 U/L (ref 39–117)
ALT: 11 U/L (ref 0–35)
AST: 12 U/L (ref 0–37)
Albumin: 4 g/dL (ref 3.5–5.2)
BILIRUBIN TOTAL: 0.5 mg/dL (ref 0.2–1.2)
BUN: 23 mg/dL (ref 6–23)
CO2: 22 meq/L (ref 19–32)
CREATININE: 1.77 mg/dL — AB (ref 0.40–1.20)
Calcium: 9.3 mg/dL (ref 8.4–10.5)
Chloride: 102 mEq/L (ref 96–112)
GFR: 36.27 mL/min — ABNORMAL LOW (ref >60.00)
Glucose, Bld: 266 mg/dL — ABNORMAL HIGH (ref 70–99)
Potassium: 4.1 mEq/L (ref 3.5–5.1)
Sodium: 134 mEq/L — ABNORMAL LOW (ref 135–145)
TOTAL PROTEIN: 6.9 g/dL (ref 6.0–8.3)

## 2018-05-09 LAB — CBC WITH DIFFERENTIAL/PLATELET
BASOS ABS: 0 10*3/uL (ref 0.0–0.1)
BASOS PCT: 0.5 % (ref 0.0–3.0)
Eosinophils Absolute: 0.1 10*3/uL (ref 0.0–0.7)
Eosinophils Relative: 3.3 % (ref 0.0–5.0)
HEMATOCRIT: 32.5 % — AB (ref 36.0–46.0)
HEMOGLOBIN: 10.6 g/dL — AB (ref 12.0–15.0)
LYMPHS PCT: 52.2 % — AB (ref 12.0–46.0)
Lymphs Abs: 2.2 10*3/uL (ref 0.7–4.0)
MCHC: 32.5 g/dL (ref 30.0–36.0)
MCV: 87 fl (ref 78.0–100.0)
Monocytes Absolute: 0.3 10*3/uL (ref 0.1–1.0)
Monocytes Relative: 7.9 % (ref 3.0–12.0)
NEUTROS ABS: 1.5 10*3/uL (ref 1.4–7.7)
NEUTROS PCT: 36.1 % — AB (ref 43.0–77.0)
PLATELETS: 301 10*3/uL (ref 150.0–400.0)
RBC: 3.73 Mil/uL — ABNORMAL LOW (ref 3.87–5.11)
RDW: 13.9 % (ref 11.5–15.5)
WBC: 4.2 10*3/uL (ref 4.0–10.5)

## 2018-05-09 MED ORDER — TRAMADOL HCL 50 MG PO TABS: ORAL_TABLET | ORAL | 0 refills | Status: DC

## 2018-05-09 NOTE — Telephone Encounter (Signed)
Please send Rx for Tramadol 50mg  BID prn X 14 tabs with no refills. Thanks

## 2018-05-09 NOTE — Telephone Encounter (Signed)
Check CBC and CMP. Obtain CT abd & pelvis with contrast given she was recently treated empirically for diverticulitis 2 weeks ago . Schedule urgent office visit with extender. Thanks

## 2018-05-09 NOTE — Telephone Encounter (Signed)
Labs today. CT in the morning and an appointment with Anderson Malta tomorrow at 3:15 Patient wants medicine for her pain. She is taking Tylenol.

## 2018-05-09 NOTE — Telephone Encounter (Signed)
Patient is advised.  

## 2018-05-09 NOTE — Telephone Encounter (Signed)
Do of the day Patient of Dr [name]. Spoke with the patient. She has developed right lower quadrant pain. Pushing in on the area gives her some comfort. Walking hurts. Normal bowel movements. Tylenol minimal relief. Recent diverticulitis in August. Pain for 2 to 3 days not improving. Please advise.

## 2018-05-10 ENCOUNTER — Ambulatory Visit: Payer: Medicare Other | Admitting: Physician Assistant

## 2018-05-10 ENCOUNTER — Other Ambulatory Visit: Payer: Self-pay

## 2018-05-10 ENCOUNTER — Ambulatory Visit (INDEPENDENT_AMBULATORY_CARE_PROVIDER_SITE_OTHER)
Admission: RE | Admit: 2018-05-10 | Discharge: 2018-05-10 | Disposition: A | Discharge status: Home / Self Care | Payer: Medicare Other | Source: Ambulatory Visit | Attending: Gastroenterology | Admitting: Gastroenterology

## 2018-05-10 ENCOUNTER — Telehealth: Payer: Self-pay | Admitting: Gastroenterology

## 2018-05-10 DIAGNOSIS — R1031 Right lower quadrant pain: Secondary | ICD-10-CM | POA: Diagnosis not present

## 2018-05-10 DIAGNOSIS — K5730 Diverticulosis of large intestine without perforation or abscess without bleeding: Secondary | ICD-10-CM | POA: Diagnosis not present

## 2018-05-10 MED ORDER — TRAMADOL HCL 50 MG PO TABS: ORAL_TABLET | ORAL | 0 refills | Status: DC

## 2018-05-10 MED ORDER — IOPAMIDOL (ISOVUE-300) INJECTION 61%
75.0000 mL | Freq: Once | INTRAVENOUS | Status: AC | PRN
  Administered 2018-05-10: 75 mL via INTRAVENOUS

## 2018-05-10 NOTE — Telephone Encounter (Signed)
Unable to contact the pharmacy at [facility]. Line busy with repeated attempts to contact. Reprinted the Rx with the dx code included on the SIG.

## 2018-05-10 NOTE — Telephone Encounter (Signed)
Hand wrote DX R10.9 abdominal pain. Faxed again to the pharmacy.

## 2018-05-18 ENCOUNTER — Other Ambulatory Visit (INDEPENDENT_AMBULATORY_CARE_PROVIDER_SITE_OTHER): Payer: Medicare Other

## 2018-05-18 ENCOUNTER — Encounter: Payer: Self-pay | Admitting: Nurse Practitioner

## 2018-05-18 ENCOUNTER — Other Ambulatory Visit: Payer: Self-pay

## 2018-05-18 ENCOUNTER — Ambulatory Visit: Payer: Medicare Other | Admitting: Nurse Practitioner

## 2018-05-18 VITALS — BP 100/68 | HR 104 | Ht 59.0 in | Wt 162.0 lb

## 2018-05-18 DIAGNOSIS — N289 Disorder of kidney and ureter, unspecified: Secondary | ICD-10-CM

## 2018-05-18 DIAGNOSIS — K5732 Diverticulitis of large intestine without perforation or abscess without bleeding: Secondary | ICD-10-CM

## 2018-05-18 LAB — BASIC METABOLIC PANEL
BUN: 13 mg/dL (ref 6–23)
CALCIUM: 9.5 mg/dL (ref 8.4–10.5)
CO2: 25 mEq/L (ref 19–32)
Chloride: 106 mEq/L (ref 96–112)
Creatinine, Ser: 1.31 mg/dL — ABNORMAL HIGH (ref 0.40–1.20)
GFR: 51.33 mL/min — AB (ref >60.00)
Glucose, Bld: 118 mg/dL — ABNORMAL HIGH (ref 70–99)
POTASSIUM: 3.9 meq/L (ref 3.5–5.1)
SODIUM: 139 meq/L (ref 135–145)

## 2018-05-18 MED ORDER — CIPROFLOXACIN HCL 500 MG PO TABS: 500.0000 mg | ORAL_TABLET | Freq: Two times a day (BID) | ORAL | 0 refills | Status: DC

## 2018-05-18 MED ORDER — METRONIDAZOLE 500 MG PO TABS: 500.0000 mg | ORAL_TABLET | Freq: Two times a day (BID) | ORAL | 0 refills | Status: DC

## 2018-05-18 NOTE — Patient Instructions (Addendum)
If you are age 72 or older, your body mass index should be between 23-30. Your Body mass index is 32.72 kg/m. If this is out of the aforementioned range listed, please consider follow up with your Primary Care Provider.  If you are age 67 or younger, your body mass index should be between 19-25. Your Body mass index is 32.72 kg/m. If this is out of the aformentioned range listed, please consider follow up with your Primary Care Provider.   We have sent the following medications to your pharmacy for you to pick up at your convenience: Flagyl Cipro  Your provider has requested that you go to the basement level for lab work before leaving today. Press "B" on the elevator. The lab is located at the first door on the left as you exit the elevator. BMET  Call us in one week with an update.  Thank you for choosing me and [facility].   Tye Savoy, NP

## 2018-05-18 NOTE — Patient Outreach (Signed)
[facility] Care Management  05/18/2018  Cindy Larson [birth year] [id number]   Medication Adherence call to Cindy Larson spoke with patient she will fill both medicationpatient ask if we can call [facility] for her an order both medications, patient is due on Lisinopril 20 mg and Atorvastatin 20 mg. Cindy Larson is showing past due under [place].   [facility] Direct Dial [phone]  Fax [phone] [email or web address]

## 2018-05-18 NOTE — Progress Notes (Signed)
Primary GI:   Scarlette Shorts, MD   Chief Complaint:  Follow up on RLQ pain    IMPRESSION and PLAN:    6.  35.-year-old female treated empirically for diverticulitis in mid August.  LLQ pain resolved with antibiotics.    2. RLQ pain. Pain started around 05/09/18 and reminiscent of diverticulitis. We ordered CT scan which revealed mild inflammatory changes involving mid sigmoid.  -Etiology of RLQ pain not clear since location doesn't correlate with inflammation on CT scan. Regardless, patient says it feels like diverticulitis to her . Will give an additional week of Cipro and Flagyl to see if any improvement. Of note, appendix was not visualized on CT scan, no hx of appendectomy. Appendicitis seems unlikely given lack of radiologic findings but remains on DDx list.   3. ? CKD.  Creatinine normal Dec 2018. In mid June it rose to 1.74 at which time PCP held lasix and lisinopril. Creatinine improved to 1.44 when check a few days later. Now with recurrent rise in creatinine back to 1.77 on labs 05/09/18. -check BMET today, if still elevated I will let PCP know. Of note, she has continued to hold the lisinopril and Lasix but today someone from [facility] to talk about why lisinopril had not been refilled lately.  Patient decided to restart the lisinopril just today.  I did ask her not to continue it until today's BMET results were reviewed by PCP   HPI:     Patient is a 72 year old female with history of DM2, HTN, GERD, adenomatous colon polyps and diverticulitis.  She called the office mid August with LLQ pain reminiscent of diverticulitis. We called in 10 days of Cipro and Flagyl . Patient seen in follow up on 8/30 and was feeling much better. She had been constipated prior to onset of symptoms and also had been under a lot of stress with ill brother and thought abdominal pain may have been aggravated by those things. At any rate the LLQ pain resolved and she did fine for following several days.  Patient the office again on 9/9 with complaints of RLQ pain.  We ordered labs as well as CTAP.  In the interim we called in ultram for her. CT scan showed subtle inflammatory changes in the mid sigmoid compatible with very mild acute diverticulitis.  No other acute findings.  White count was normal, hemoglobin 10.6 which was down slightly from baseline of around 12.  Creatinine elevated at 1.77.  No additional antibiotics were called in, she was given an appointment for follow-up.   Patient has continued to have RLQ discomfort.  BMs at baseline. The discomfort had been getting better with BM except for today.  The pain is a constant nagging discomfort, unrelated to meals (she thinks).  Pain is not positional.  She has no associated urinary symptoms.  No fevers.  Pain feels like diverticulitis type pain except that it is in the RLQ this time.  She has taken all the ultram with the exception of 1 tablet (saved in case of severe pain)   Review of systems:     No chest pain, no SOB, no fevers, no urinary sx   Past Medical History:  Diagnosis Date  . ALLERGIC RHINITIS   . ANXIETY   . COLONIC POLYPS, HX OF   . DEPRESSION   . DIABETES MELLITUS, TYPE II   . Diverticulitis   . DIVERTICULOSIS, COLON   . Esophageal stricture   . Gallstones   .  GERD   . HIATAL HERNIA   . History of blood transfusion    left knee replacement  . HYPERLIPIDEMIA   . Hypersomnolence   . HYPERTENSION   . INTERMITTENT VERTIGO   . OBESITY   . OSTEOARTHRITIS    L knee  . OSTEOPENIA   . Pneumonia   . Post-menopausal    hormone replacement therapy    Patient's surgical history, family medical history, social history, medications and allergies were all reviewed in Epic   Serum creatinine: 1.77 mg/dL (H) 05/09/18 1145 Estimated creatinine clearance: 25.4 mL/min (A)  Current Outpatient Medications  Medication Sig Dispense Refill  . ALPRAZolam (XANAX) 0.25 MG tablet Take 1 tablet (0.25 mg total) by mouth 2 (two) times  daily as needed. for anxiety 60 tablet 2  . aspirin EC 81 MG tablet Take 1 tablet (81 mg total) by mouth daily. 90 tablet 11  . atorvastatin (LIPITOR) 20 MG tablet Take 1 tablet (20 mg total) by mouth daily. 90 tablet 3  . Cholecalciferol (VITAMIN D) 2000 UNITS CAPS Take 2,000 Units by mouth daily.     . cyclobenzaprine (FLEXERIL) 5 MG tablet TAKE ONE TABLET BY MOUTH THREE TIMES DAILY AS NEEDED FOR MUSCLE SPASM 60 tablet 1  . gabapentin (NEURONTIN) 300 MG capsule Take 1 capsule (300 mg total) by mouth 3 (three) times daily. Start with 1 capsule at bedtime then advanced to one capsule twice a day and if need be increased to 1 capsule 3 times a day 90 capsule 5  . glimepiride (AMARYL) 4 MG tablet Take 1 tablet (4 mg total) by mouth 2 (two) times daily. 180 tablet 3  . lisinopril (PRINIVIL,ZESTRIL) 20 MG tablet TAKE 1 TABLET BY MOUTH ONCE DAILY 90 tablet 2  . lisinopril (PRINIVIL,ZESTRIL) 5 MG tablet Take 1 tablet (5 mg total) by mouth daily. 90 tablet 3  . meclizine (ANTIVERT) 25 MG tablet Take 25 mg by mouth every 6 (six) hours as needed for dizziness. Reported on 01/01/2016    . metFORMIN (GLUCOPHAGE) 500 MG tablet TAKE TWO TABLETS BY MOUTH TWICE DAILY WITH  A  MEAL 360 tablet 3  . nystatin (MYCOSTATIN) 100000 UNIT/ML suspension Take 5 cc's by mouth, swish and swallow 4 times daily x 2 weeks. 473 mL 0  . omeprazole (PRILOSEC) 20 MG capsule Take 1 capsule (20 mg total) by mouth 2 (two) times daily before a meal. 180 capsule 3  . ONE TOUCH ULTRA TEST test strip USE ONE TO CHECK BLOOD SUGARS DAILY 25 each 3  . ONETOUCH DELICA LANCETS 51O MISC Use to help check blood sugars daily. E11.9 100 each 1  . PARoxetine (PAXIL) 20 MG tablet Take 1 tablet (20 mg total) by mouth daily. 90 tablet 3  . promethazine (PHENERGAN) 25 MG tablet TAKE ONE TABLET BY MOUTH EVERY 6 HOURS AS NEEDED FOR NAUSEA 40 tablet 0  . traMADol (ULTRAM) 50 MG tablet Take one tablet twice daily prn severe pain Dx R10.9 14 tablet 0   No  current facility-administered medications for this visit.     Physical Exam:     BP 100/68   Pulse (!) 104   Ht 4\' 11"  (1.499 m)   Wt 162 lb (73.5 kg)   BMI 32.72 kg/m   GENERAL:  Pleasant female in NAD PSYCH: : Cooperative, normal affect EENT:  conjunctiva pink, mucous membranes moist, neck supple without masses CARDIAC:  RRR,  1+ bilateral pedal edema PULM: Normal respiratory effort, lungs CTA bilaterally, no wheezing ABDOMEN:  Nondistended, soft, mild to mod RLQ tenderness.  No obvious masses, no hepatomegaly,  normal bowel sounds SKIN:  turgor, no lesions seen Musculoskeletal:  Normal muscle tone, normal strength NEURO: Alert and oriented x 3, no focal neurologic deficits   Tye Savoy , NP 05/18/2018, 3:10 PM

## 2018-05-19 ENCOUNTER — Encounter: Payer: Self-pay | Admitting: Nurse Practitioner

## 2018-05-19 NOTE — Progress Notes (Signed)
Assessment and plan reviewed 

## 2018-05-25 ENCOUNTER — Telehealth: Payer: Self-pay | Admitting: Nurse Practitioner

## 2018-05-25 NOTE — Telephone Encounter (Signed)
Pt called to let Nevin Bloodgood know that she is feeling much better and to thank her for her medical assistance.

## 2018-05-27 ENCOUNTER — Telehealth: Payer: Self-pay | Admitting: *Deleted

## 2018-05-27 NOTE — Telephone Encounter (Signed)
-----   Message from Biagio Borg, MD sent at 05/27/2018 10:05 AM EDT ----- Regarding: worsening renal function Thanks, we will try to have her in for a visit,  Staff to assist with pt to make appt for worsening renal function please ----- Message ----- From: Willia Craze, NP Sent: 05/27/2018  10:01 AM EDT To: Biagio Borg, MD  Hi,  Just an 51.Marland KitchenMarland Kitchen I have been seeing patient for abdominal pain and noticed renal function has been down recently. You held Lisinopril and maybe another med but Cr still up some. Patient mentioned to me that she had restarted Lisinopril and since you may not want her to do that I asked she contact your office. Thanks

## 2018-05-27 NOTE — Telephone Encounter (Signed)
Great news. On an GI unrelated note Cindy Larson her Cr has been abnormal lately. PCP was holding some of her meds because of it. Patient has self restarted Lisinopril recently but I asked her to hold off until PCP could decide after whether to restart it. Please ask her to touch base with PCP, it isn't urgent but should be addressed in next few weeks. I will send PCP a heads up. Thanks

## 2018-05-27 NOTE — Telephone Encounter (Signed)
Called the patient. She said to let you know she has called PCP and she has an appointment with PCP next week.

## 2018-05-27 NOTE — Telephone Encounter (Signed)
Notified pt w/MD response. Made appt for next week 06/01/18 @ 2:40pm../lmb

## 2018-06-01 ENCOUNTER — Ambulatory Visit (INDEPENDENT_AMBULATORY_CARE_PROVIDER_SITE_OTHER): Payer: Medicare Other | Admitting: Internal Medicine

## 2018-06-01 ENCOUNTER — Encounter: Payer: Self-pay | Admitting: Internal Medicine

## 2018-06-01 VITALS — BP 110/70 | HR 100 | Temp 98.4°F | Ht 59.0 in | Wt 161.0 lb

## 2018-06-01 DIAGNOSIS — E119 Type 2 diabetes mellitus without complications: Secondary | ICD-10-CM | POA: Diagnosis not present

## 2018-06-01 DIAGNOSIS — N183 Chronic kidney disease, stage 3 unspecified: Secondary | ICD-10-CM

## 2018-06-01 DIAGNOSIS — Z23 Encounter for immunization: Secondary | ICD-10-CM | POA: Diagnosis not present

## 2018-06-01 DIAGNOSIS — F411 Generalized anxiety disorder: Secondary | ICD-10-CM

## 2018-06-01 DIAGNOSIS — I10 Essential (primary) hypertension: Secondary | ICD-10-CM

## 2018-06-01 DIAGNOSIS — N189 Chronic kidney disease, unspecified: Secondary | ICD-10-CM | POA: Insufficient documentation

## 2018-06-01 LAB — POCT GLYCOSYLATED HEMOGLOBIN (HGB A1C)
HEMOGLOBIN A1C: 5.7 % — AB (ref 4.0–5.6)
HbA1c POC (<> result, manual entry): 0 % (ref 4.0–5.6)
HbA1c, POC (controlled diabetic range): 0 % (ref 0.0–7.0)
HbA1c, POC (prediabetic range): 0 % — AB (ref 5.7–6.4)

## 2018-06-01 MED ORDER — ONETOUCH ULTRA 2 W/DEVICE KIT: PACK | 0 refills | Status: DC

## 2018-06-01 MED ORDER — ALPRAZOLAM 0.25 MG PO TABS: 0.2500 mg | ORAL_TABLET | Freq: Two times a day (BID) | ORAL | 2 refills | Status: DC | PRN

## 2018-06-01 MED ORDER — ONETOUCH DELICA LANCETS 33G MISC: 1 refills | Status: DC

## 2018-06-01 MED ORDER — GLUCOSE BLOOD VI STRP: ORAL_STRIP | 3 refills | Status: DC

## 2018-06-01 MED ORDER — LISINOPRIL 5 MG PO TABS: 5.0000 mg | ORAL_TABLET | Freq: Every day | ORAL | 3 refills | Status: DC

## 2018-06-01 NOTE — Assessment & Plan Note (Signed)
stable overall by history and exam, recent data reviewed with pt, and pt to continue medical treatment as before,  to f/u any worsening symptoms or concerns  

## 2018-06-01 NOTE — Assessment & Plan Note (Signed)
Lab Results  Component Value Date   HGBA1C 5.7 (A) 06/01/2018   HGBA1C 0 06/01/2018   HGBA1C 0 (A) 06/01/2018   HGBA1C 0.0 06/01/2018  stable overall by history and exam, recent data reviewed with pt, and pt to continue medical treatment as before,  to f/u any worsening symptoms or concerns

## 2018-06-01 NOTE — Patient Instructions (Addendum)
.  You had the flu shot today  Your A1c was OK today  Please continue all other medications as before, and refills have been done if requested - the xanax and low dose lisinopril  Please have the pharmacy call with any other refills you may need.  Please continue your efforts at being more active, low cholesterol diet, and weight control  Please keep your appointments with your specialists as you may have planned

## 2018-06-01 NOTE — Progress Notes (Signed)
Subjective:    Patient ID: Cindy Larson, female    DOB: 1946/08/31, 72 y.o.   MRN: [id number]  HPI  Here to f/u; overall doing ok,  Pt denies chest pain, increasing sob or doe, wheezing, orthopnea, PND, increased LE swelling, palpitations, dizziness or syncope.  Pt denies new neurological symptoms such as new headache, or facial or extremity weakness or numbness.  Pt denies polydipsia, polyuria, or low sugar episode.  Pt states overall good compliance with meds, mostly trying to follow appropriate diet, with wt overall stable, Denies worsening depressive symptoms, suicidal ideation, or panic; has ongoing anxiety though, increased recently after visiting brother on hospice, and may pass soon. Hard to sleep, asks for xanax prn refill Past Medical History:  Diagnosis Date  . ALLERGIC RHINITIS   . ANXIETY   . COLONIC POLYPS, HX OF   . DEPRESSION   . DIABETES MELLITUS, TYPE II   . Diverticulitis   . DIVERTICULOSIS, COLON   . Esophageal stricture   . Gallstones   . GERD   . HIATAL HERNIA   . History of blood transfusion    left knee replacement  . HYPERLIPIDEMIA   . Hypersomnolence   . HYPERTENSION   . INTERMITTENT VERTIGO   . OBESITY   . OSTEOARTHRITIS    L knee  . OSTEOPENIA   . Pneumonia   . Post-menopausal    hormone replacement therapy   Past Surgical History:  Procedure Laterality Date  . ABDOMINAL HYSTERECTOMY    . CESAREAN SECTION    . CHOLECYSTECTOMY    . Lysis of Adhesions    . OOPHORECTOMY     one  . ROTATOR CUFF REPAIR Left   . TOTAL KNEE ARTHROPLASTY Left     reports that she has never smoked. She has never used smokeless tobacco. She reports that she does not drink alcohol or use drugs. family history includes Atrial fibrillation in her mother; Breast cancer in her maternal grandmother; Cancer in her mother; Colon cancer in her paternal aunt; Diabetes in her other; Heart disease in her mother; Heart failure in her brother; Hypertension in her mother; Kidney  disease in her maternal aunt; Lung cancer in her brother; Ovarian cancer in her sister. Allergies  Allergen Reactions  . Aspirin Other (See Comments)    Stomach hurts  . Penicillins Hives    blisters  . Prevnar [Pneumococcal 13-Val Conj Vacc] Swelling    Left arm swelling  . Zocor [Simvastatin]     REACTION: itch   Current Outpatient Medications on File Prior to Visit  Medication Sig Dispense Refill  . aspirin EC 81 MG tablet Take 1 tablet (81 mg total) by mouth daily. 90 tablet 11  . atorvastatin (LIPITOR) 20 MG tablet Take 1 tablet (20 mg total) by mouth daily. 90 tablet 3  . Cholecalciferol (VITAMIN D) 2000 UNITS CAPS Take 2,000 Units by mouth daily.     . ciprofloxacin (CIPRO) 500 MG tablet Take 1 tablet (500 mg total) by mouth 2 (two) times daily. Take twice daily for 7 days. 14 tablet 0  . cyclobenzaprine (FLEXERIL) 5 MG tablet TAKE ONE TABLET BY MOUTH THREE TIMES DAILY AS NEEDED FOR MUSCLE SPASM 60 tablet 1  . gabapentin (NEURONTIN) 300 MG capsule Take 1 capsule (300 mg total) by mouth 3 (three) times daily. Start with 1 capsule at bedtime then advanced to one capsule twice a day and if need be increased to 1 capsule 3 times a day 90 capsule 5  .  glimepiride (AMARYL) 4 MG tablet Take 1 tablet (4 mg total) by mouth 2 (two) times daily. 180 tablet 3  . meclizine (ANTIVERT) 25 MG tablet Take 25 mg by mouth every 6 (six) hours as needed for dizziness. Reported on 01/01/2016    . metFORMIN (GLUCOPHAGE) 500 MG tablet TAKE TWO TABLETS BY MOUTH TWICE DAILY WITH  A  MEAL 360 tablet 3  . metroNIDAZOLE (FLAGYL) 500 MG tablet Take 1 tablet (500 mg total) by mouth 2 (two) times daily. Take twice daily for 7 days. 14 tablet 0  . nystatin (MYCOSTATIN) 100000 UNIT/ML suspension Take 5 cc's by mouth, swish and swallow 4 times daily x 2 weeks. 473 mL 0  . omeprazole (PRILOSEC) 20 MG capsule Take 1 capsule (20 mg total) by mouth 2 (two) times daily before a meal. 180 capsule 3  . PARoxetine (PAXIL) 20 MG  tablet Take 1 tablet (20 mg total) by mouth daily. 90 tablet 3  . promethazine (PHENERGAN) 25 MG tablet TAKE ONE TABLET BY MOUTH EVERY 6 HOURS AS NEEDED FOR NAUSEA 40 tablet 0  . traMADol (ULTRAM) 50 MG tablet Take one tablet twice daily prn severe pain Dx R10.9 14 tablet 0   No current facility-administered medications on file prior to visit.    Review of Systems  Constitutional: Negative for other unusual diaphoresis or sweats HENT: Negative for ear discharge or swelling Eyes: Negative for other worsening visual disturbances Respiratory: Negative for stridor or other swelling  Gastrointestinal: Negative for worsening distension or other blood Genitourinary: Negative for retention or other urinary change Musculoskeletal: Negative for other MSK pain or swelling Skin: Negative for color change or other new lesions Neurological: Negative for worsening tremors and other numbness  Psychiatric/Behavioral: Negative for worsening agitation or other fatigue All other system neg per pt    Objective:   Physical Exam BP 110/70   Pulse 100   Temp 98.4 F (36.9 C) (Oral)   Ht 4\' 11"  (1.499 m)   Wt 161 lb (73 kg)   SpO2 97%   BMI 32.52 kg/m  VS noted,  Constitutional: Pt appears in NAD HENT: Head: NCAT.  Right Ear: External ear normal.  Left Ear: External ear normal.  Eyes: . Pupils are equal, round, and reactive to light. Conjunctivae and EOM are normal Nose: without d/c or deformity Neck: Neck supple. Gross normal ROM Cardiovascular: Normal rate and regular rhythm.   Pulmonary/Chest: Effort normal and breath sounds without rales or wheezing.  Abd:  Soft, NT, ND, + BS, no organomegaly Neurological: Pt is alert. At baseline orientation, motor grossly intact Skin: Skin is warm. No rashes, other new lesions, no LE edema Psychiatric: Pt behavior is normal without agitation , 1+ nervous No other exam findings  POCT glycosylated hemoglobin (Hb A1C)  Order: [id number]  Status:  Final  result Visible to patient:  No (Not Released) Dx:  Type 2 diabetes mellitus without comp...   Ref Range & Units 15:33 8mo ago 65mo ago 5yr ago  Hemoglobin A1C 4.0 - 5.6 % 5.7Abnormal   6.9High  R, CM 7.4High  R, CM 6.7High  R, CM           Assessment & Plan:

## 2018-06-01 NOTE — Assessment & Plan Note (Signed)
Now  Chronic, followed per renal, some improved recently, to restart lisinopril 5 qd

## 2018-06-01 NOTE — Assessment & Plan Note (Signed)
Ok for xanax refill,  to f/u any worsening symptoms or concerns 

## 2018-07-01 DIAGNOSIS — R279 Unspecified lack of coordination: Secondary | ICD-10-CM | POA: Diagnosis not present

## 2018-07-01 DIAGNOSIS — Z7401 Bed confinement status: Secondary | ICD-10-CM | POA: Diagnosis not present

## 2018-07-31 ENCOUNTER — Other Ambulatory Visit: Payer: Self-pay | Admitting: Internal Medicine

## 2018-08-16 ENCOUNTER — Encounter: Payer: Self-pay | Admitting: Internal Medicine

## 2018-08-16 ENCOUNTER — Ambulatory Visit: Payer: Medicare Other

## 2018-08-16 ENCOUNTER — Other Ambulatory Visit (INDEPENDENT_AMBULATORY_CARE_PROVIDER_SITE_OTHER): Payer: Medicare Other

## 2018-08-16 ENCOUNTER — Ambulatory Visit (INDEPENDENT_AMBULATORY_CARE_PROVIDER_SITE_OTHER): Payer: Medicare Other | Admitting: Internal Medicine

## 2018-08-16 VITALS — BP 138/88 | HR 100 | Temp 98.2°F | Ht 59.0 in | Wt 165.0 lb

## 2018-08-16 DIAGNOSIS — E119 Type 2 diabetes mellitus without complications: Secondary | ICD-10-CM

## 2018-08-16 DIAGNOSIS — N183 Chronic kidney disease, stage 3 unspecified: Secondary | ICD-10-CM

## 2018-08-16 DIAGNOSIS — Z0000 Encounter for general adult medical examination without abnormal findings: Secondary | ICD-10-CM | POA: Diagnosis not present

## 2018-08-16 LAB — URINALYSIS, ROUTINE W REFLEX MICROSCOPIC
Bilirubin Urine: NEGATIVE
Hgb urine dipstick: NEGATIVE
Leukocytes, UA: NEGATIVE
NITRITE: NEGATIVE
RBC / HPF: NONE SEEN (ref 0–?)
Specific Gravity, Urine: 1.025 (ref 1.000–1.030)
UROBILINOGEN UA: 0.2 (ref 0.0–1.0)
Urine Glucose: NEGATIVE
pH: 5.5 (ref 5.0–8.0)

## 2018-08-16 LAB — LIPID PANEL
Cholesterol: 134 mg/dL (ref 0–200)
HDL: 55 mg/dL (ref >39.00)
LDL Cholesterol: 58 mg/dL (ref 0–99)
NonHDL: 78.77
Total CHOL/HDL Ratio: 2
Triglycerides: 105 mg/dL (ref 0.0–149.0)
VLDL: 21 mg/dL (ref 0.0–40.0)

## 2018-08-16 LAB — CBC WITH DIFFERENTIAL/PLATELET
Basophils Absolute: 0 10*3/uL (ref 0.0–0.1)
Basophils Relative: 0.6 % (ref 0.0–3.0)
Eosinophils Absolute: 0.2 10*3/uL (ref 0.0–0.7)
Eosinophils Relative: 3.4 % (ref 0.0–5.0)
HCT: 32.7 % — ABNORMAL LOW (ref 36.0–46.0)
Hemoglobin: 10.9 g/dL — ABNORMAL LOW (ref 12.0–15.0)
Lymphocytes Relative: 54.9 % — ABNORMAL HIGH (ref 12.0–46.0)
Lymphs Abs: 3 10*3/uL (ref 0.7–4.0)
MCHC: 33.2 g/dL (ref 30.0–36.0)
MCV: 87.6 fl (ref 78.0–100.0)
MONO ABS: 0.3 10*3/uL (ref 0.1–1.0)
Monocytes Relative: 5.9 % (ref 3.0–12.0)
NEUTROS ABS: 1.9 10*3/uL (ref 1.4–7.7)
Neutrophils Relative %: 35.2 % — ABNORMAL LOW (ref 43.0–77.0)
PLATELETS: 305 10*3/uL (ref 150.0–400.0)
RBC: 3.73 Mil/uL — ABNORMAL LOW (ref 3.87–5.11)
RDW: 14.7 % (ref 11.5–15.5)
WBC: 5.5 10*3/uL (ref 4.0–10.5)

## 2018-08-16 LAB — BASIC METABOLIC PANEL
BUN: 18 mg/dL (ref 6–23)
CO2: 26 mEq/L (ref 19–32)
Calcium: 9.4 mg/dL (ref 8.4–10.5)
Chloride: 103 mEq/L (ref 96–112)
Creatinine, Ser: 1.34 mg/dL — ABNORMAL HIGH (ref 0.40–1.20)
GFR: 49.97 mL/min — ABNORMAL LOW (ref >60.00)
GLUCOSE: 106 mg/dL — AB (ref 70–99)
Potassium: 4.2 mEq/L (ref 3.5–5.1)
Sodium: 139 mEq/L (ref 135–145)

## 2018-08-16 LAB — HEMOGLOBIN A1C: Hgb A1c MFr Bld: 6 % (ref 4.6–6.5)

## 2018-08-16 LAB — MICROALBUMIN / CREATININE URINE RATIO
Creatinine,U: 184.9 mg/dL
Microalb Creat Ratio: 1.6 mg/g (ref 0.0–30.0)
Microalb, Ur: 3 mg/dL — ABNORMAL HIGH (ref 0.0–1.9)

## 2018-08-16 LAB — HEPATIC FUNCTION PANEL
ALK PHOS: 51 U/L (ref 39–117)
ALT: 11 U/L (ref 0–35)
AST: 11 U/L (ref 0–37)
Albumin: 4.2 g/dL (ref 3.5–5.2)
Bilirubin, Direct: 0.1 mg/dL (ref 0.0–0.3)
Total Bilirubin: 0.5 mg/dL (ref 0.2–1.2)
Total Protein: 6.8 g/dL (ref 6.0–8.3)

## 2018-08-16 LAB — TSH: TSH: 1.91 u[IU]/mL (ref 0.35–4.50)

## 2018-08-16 NOTE — Progress Notes (Unsigned)
Subjective:   Cindy Larson is a 72 y.o. female who presents for Medicare Annual (Subsequent) preventive examination.  Review of Systems:  No ROS.  Medicare Wellness Visit. Additional risk factors are reflected in the social history.    Sleep patterns: {SX; SLEEP PATTERNS:18802::"feels rested on waking","does not get up to void","gets up *** times nightly to void","sleeps *** hours nightly"}.    Home Safety/Smoke Alarms: Feels safe in home. Smoke alarms in place.  Living environment; residence and Firearm Safety: {Rehab home environment / accessibility:30080::"no firearms","firearms stored safely"}. Seat Belt Safety/Bike Helmet: Wears seat belt.    Objective:     Vitals: There were no vitals taken for this visit.  There is no height or weight on file to calculate BMI.  Advanced Directives 02/03/2017 01/16/2015  Does Patient Have a Medical Advance Directive? No Yes  Copy of [place] in Chart? - No - copy requested    Tobacco Social History   Tobacco Use  Smoking Status Never Smoker  Smokeless Tobacco Never Used     Counseling given: Not Answered  Past Medical History:  Diagnosis Date  . ALLERGIC RHINITIS   . ANXIETY   . COLONIC POLYPS, HX OF   . DEPRESSION   . DIABETES MELLITUS, TYPE II   . Diverticulitis   . DIVERTICULOSIS, COLON   . Esophageal stricture   . Gallstones   . GERD   . HIATAL HERNIA   . History of blood transfusion    left knee replacement  . HYPERLIPIDEMIA   . Hypersomnolence   . HYPERTENSION   . INTERMITTENT VERTIGO   . OBESITY   . OSTEOARTHRITIS    L knee  . OSTEOPENIA   . Pneumonia   . Post-menopausal    hormone replacement therapy   Past Surgical History:  Procedure Laterality Date  . ABDOMINAL HYSTERECTOMY    . CESAREAN SECTION    . CHOLECYSTECTOMY    . Lysis of Adhesions    . OOPHORECTOMY     one  . ROTATOR CUFF REPAIR Left   . TOTAL KNEE ARTHROPLASTY Left    Family History  Problem Relation Age of  Onset  . Hypertension Mother   . Atrial fibrillation Mother   . Heart disease Mother        CHF  . Cancer Mother        Cancer in "thigh"  . Colon cancer Paternal Aunt        originated as breast CA  . Lung cancer Brother        smoker  . Diabetes Other        paternal Aunt and uncle  . Breast cancer Maternal Grandmother   . Ovarian cancer Sister   . Heart failure Brother        CHF  . Kidney disease Maternal Aunt    Social History   Socioeconomic History  . Marital status: Married    Spouse name: Not on file  . Number of children: 3  . Years of education: Not on file  . Highest education level: Not on file  Occupational History  . Occupation: Control and instrumentation engineer  Social Needs  . Financial resource strain: Not on file  . Food insecurity:    Worry: Not on file    Inability: Not on file  . Transportation needs:    Medical: Not on file    Non-medical: Not on file  Tobacco Use  . Smoking status: Never Smoker  . Smokeless tobacco: Never  Used  Substance and Sexual Activity  . Alcohol use: No  . Drug use: No  . Sexual activity: Not on file  Lifestyle  . Physical activity:    Days per week: Not on file    Minutes per session: Not on file  . Stress: Not on file  Relationships  . Social connections:    Talks on phone: Not on file    Gets together: Not on file    Attends religious service: Not on file    Active member of club or organization: Not on file    Attends meetings of clubs or organizations: Not on file    Relationship status: Not on file  Other Topics Concern  . Not on file  Social History Narrative  . Not on file    Outpatient Encounter Medications as of 08/16/2018  Medication Sig  . ALPRAZolam (XANAX) 0.25 MG tablet Take 1 tablet (0.25 mg total) by mouth 2 (two) times daily as needed. for anxiety  . aspirin EC 81 MG tablet Take 1 tablet (81 mg total) by mouth daily.  Marland Kitchen atorvastatin (LIPITOR) 20 MG tablet Take 1 tablet (20 mg total) by mouth daily.  . Blood  Glucose Monitoring Suppl (ONE TOUCH ULTRA 2) w/Device KIT Use as directed once daily E11.9  . Cholecalciferol (VITAMIN D) 2000 UNITS CAPS Take 2,000 Units by mouth daily.   . ciprofloxacin (CIPRO) 500 MG tablet Take 1 tablet (500 mg total) by mouth 2 (two) times daily. Take twice daily for 7 days.  . cyclobenzaprine (FLEXERIL) 5 MG tablet TAKE ONE TABLET BY MOUTH THREE TIMES DAILY AS NEEDED FOR MUSCLE SPASM  . gabapentin (NEURONTIN) 300 MG capsule Take 1 capsule (300 mg total) by mouth 3 (three) times daily. Start with 1 capsule at bedtime then advanced to one capsule twice a day and if need be increased to 1 capsule 3 times a day  . glimepiride (AMARYL) 4 MG tablet Take 1 tablet (4 mg total) by mouth 2 (two) times daily.  Marland Kitchen glucose blood (ONE TOUCH ULTRA TEST) test strip USE ONE TO CHECK BLOOD SUGARS DAILY E11.9  . lisinopril (PRINIVIL,ZESTRIL) 5 MG tablet Take 1 tablet (5 mg total) by mouth daily.  . meclizine (ANTIVERT) 25 MG tablet Take 25 mg by mouth every 6 (six) hours as needed for dizziness. Reported on 01/01/2016  . metFORMIN (GLUCOPHAGE) 500 MG tablet TAKE TWO TABLETS BY MOUTH TWICE DAILY WITH  A  MEAL  . metroNIDAZOLE (FLAGYL) 500 MG tablet Take 1 tablet (500 mg total) by mouth 2 (two) times daily. Take twice daily for 7 days.  Marland Kitchen nystatin (MYCOSTATIN) 100000 UNIT/ML suspension Take 5 cc's by mouth, swish and swallow 4 times daily x 2 weeks.  Marland Kitchen omeprazole (PRILOSEC) 20 MG capsule TAKE 1 CAPSULE BY MOUTH TWICE DAILY BEFORE MEAL(S)  . ONETOUCH DELICA LANCETS 09Q MISC Use to help check blood sugars daily. E11.9  . PARoxetine (PAXIL) 20 MG tablet TAKE 1 TABLET BY MOUTH ONCE DAILY  . promethazine (PHENERGAN) 25 MG tablet TAKE ONE TABLET BY MOUTH EVERY 6 HOURS AS NEEDED FOR NAUSEA  . traMADol (ULTRAM) 50 MG tablet Take one tablet twice daily prn severe pain Dx R10.9   No facility-administered encounter medications on file as of 08/16/2018.     Activities of Daily Living No flowsheet data  found.  Patient Care Team: Biagio Borg, MD as PCP - Gwenevere Abbot, Docia Chuck, MD as Consulting Physician (Gastroenterology) Avon Gully, NP as Nurse Practitioner (Obstetrics and  Gynecology)    Assessment:   This is a routine wellness examination for Wanda. Physical assessment deferred to PCP.   Exercise Activities and Dietary recommendations   Diet (meal preparation, eat out, water intake, caffeinated beverages, dairy products, fruits and vegetables): {Desc; diets:16563}  Goals    . Prevent Falls     Hx 3 falls in the last year;  (one due to bee stings; the other on concrete) One she slipped when wet and missed step; and tripped on rug in home Will start exercise at [employer]; and walking; go slow when raining;  Recommend strength training and balance / discussed exercises and agrees to attempt [employer] x 2 and walking at least 2 times a week         Fall Risk Fall Risk  08/03/2017 01/31/2016 01/16/2015 01/16/2015 12/14/2013  Falls in the past year? No No Yes Yes No  Number falls in past yr: - - 2 or more 2 or more -  Comment - - - #1 running from [place], #2 slipped on water outside after  new glasses, #3 tripped in house doing laundry -  Injury with Fall? - - No - -  Follow up - - Education provided;Falls prevention discussed - -  Comment - - not really high risk; very mobile; needs to slow down; education given - -    Depression Screen PHQ 2/9 Scores 08/03/2017 01/31/2016 01/16/2015 01/16/2015  PHQ - 2 Score 0 0 0 1  PHQ- 9 Score 0 - - -     Cognitive Function MMSE - Mini Mental State Exam 01/16/2015  Not completed: Unable to complete        Immunization History  Administered Date(s) Administered  . Influenza Split 06/29/2012  . Influenza Whole 07/18/2003, 06/22/2008  . Influenza, High Dose Seasonal PF 06/15/2013, 06/25/2015, 06/09/2016, 06/17/2017, 06/01/2018  . Influenza,inj,Quad PF,6+ Mos 06/15/2014  . Pneumococcal Conjugate-13 06/29/2013  .  Pneumococcal Polysaccharide-23 08/17/2005, 10/24/2010, 08/04/2016  . Td 10/24/2010   Screening Tests Health Maintenance  Topic Date Due  . OPHTHALMOLOGY EXAM  03/01/2018  . FOOT EXAM  08/03/2018  . HEMOGLOBIN A1C  12/01/2018  . MAMMOGRAM  09/25/2019  . COLONOSCOPY  03/02/2020  . TETANUS/TDAP  10/24/2020  . INFLUENZA VACCINE  Completed  . DEXA SCAN  Completed  . Hepatitis C Screening  Completed  . PNA vac Low Risk Adult  Completed      Plan:      I have personally reviewed and noted the following in the patient's chart:   . Medical and social history . Use of alcohol, tobacco or illicit drugs  . Current medications and supplements . Functional ability and status . Nutritional status . Physical activity . Advanced directives . List of other physicians . Vitals . Screenings to include cognitive, depression, and falls . Referrals and appointments  In addition, I have reviewed and discussed with patient certain preventive protocols, quality metrics, and best practice recommendations. A written personalized care plan for preventive services as well as general preventive health recommendations were provided to patient.     Michiel Cowboy, RN  08/16/2018

## 2018-08-16 NOTE — Patient Instructions (Signed)

## 2018-08-16 NOTE — Assessment & Plan Note (Signed)
stable overall by history and exam, recent data reviewed with pt, and pt to continue medical treatment as before,  to f/u any worsening symptoms or concerns  

## 2018-08-16 NOTE — Assessment & Plan Note (Signed)
stable overall by history and exam, recent data reviewed with pt, and pt to continue medical treatment as before,  to f/u any worsening symptoms or concerns, for a1c with labs 

## 2018-08-16 NOTE — Assessment & Plan Note (Signed)

## 2018-08-16 NOTE — Progress Notes (Signed)
Subjective:    Patient ID: Cindy Larson, female    DOB: 1946/05/13, 72 y.o.   MRN: [id number]  HPI  Here for wellness and f/u;  Overall doing ok;  Pt denies Chest pain, worsening SOB, DOE, wheezing, orthopnea, PND, worsening LE edema, palpitations, dizziness or syncope.  Pt denies neurological change such as new headache, facial or extremity weakness.  Pt denies polydipsia, polyuria, or low sugar symptoms. Pt states overall good compliance with treatment and medications, good tolerability, and has been trying to follow appropriate diet.  Pt denies worsening depressive symptoms, suicidal ideation or panic. No fever, night sweats, wt loss, loss of appetite, or other constitutional symptoms.  Pt states good ability with ADL's, has low fall risk, home safety reviewed and adequate, no other significant changes in hearing or vision, and only occasionally active with exercise. Does have several wks ongoing nasal allergy symptoms with clearish congestion, itch and sneezing, without fever, pain, ST, cough, swelling or wheezing, plans to restart her home meds.  Not taking the lasix, now on HCT.  Please continue all other medications as before, and refills have been done if requested. have planned  No other new complaints Past Medical History:  Diagnosis Date  . ALLERGIC RHINITIS   . ANXIETY   . COLONIC POLYPS, HX OF   . DEPRESSION   . DIABETES MELLITUS, TYPE II   . Diverticulitis   . DIVERTICULOSIS, COLON   . Esophageal stricture   . Gallstones   . GERD   . HIATAL HERNIA   . History of blood transfusion    left knee replacement  . HYPERLIPIDEMIA   . Hypersomnolence   . HYPERTENSION   . INTERMITTENT VERTIGO   . OBESITY   . OSTEOARTHRITIS    L knee  . OSTEOPENIA   . Pneumonia   . Post-menopausal    hormone replacement therapy   Past Surgical History:  Procedure Laterality Date  . ABDOMINAL HYSTERECTOMY    . CESAREAN SECTION    . CHOLECYSTECTOMY    . Lysis of Adhesions    .  OOPHORECTOMY     one  . ROTATOR CUFF REPAIR Left   . TOTAL KNEE ARTHROPLASTY Left     reports that she has never smoked. She has never used smokeless tobacco. She reports that she does not drink alcohol or use drugs. family history includes Atrial fibrillation in her mother; Breast cancer in her maternal grandmother; Cancer in her mother; Colon cancer in her paternal aunt; Diabetes in an other family member; Heart disease in her mother; Heart failure in her brother; Hypertension in her mother; Kidney disease in her maternal aunt; Lung cancer in her brother; Ovarian cancer in her sister. Allergies  Allergen Reactions  . Aspirin Other (See Comments)    Stomach hurts  . Penicillins Hives    blisters  . Prevnar [Pneumococcal 13-Val Conj Vacc] Swelling    Left arm swelling  . Zocor [Simvastatin]     REACTION: itch   Current Outpatient Medications on File Prior to Visit  Medication Sig Dispense Refill  . ALPRAZolam (XANAX) 0.25 MG tablet Take 1 tablet (0.25 mg total) by mouth 2 (two) times daily as needed. for anxiety 60 tablet 2  . aspirin EC 81 MG tablet Take 1 tablet (81 mg total) by mouth daily. 90 tablet 11  . atorvastatin (LIPITOR) 20 MG tablet Take 1 tablet (20 mg total) by mouth daily. 90 tablet 3  . Blood Glucose Monitoring Suppl (ONE TOUCH ULTRA 2)  w/Device KIT Use as directed once daily E11.9 1 each 0  . Cholecalciferol (VITAMIN D) 2000 UNITS CAPS Take 2,000 Units by mouth daily.     . ciprofloxacin (CIPRO) 500 MG tablet Take 1 tablet (500 mg total) by mouth 2 (two) times daily. Take twice daily for 7 days. 14 tablet 0  . cyclobenzaprine (FLEXERIL) 5 MG tablet TAKE ONE TABLET BY MOUTH THREE TIMES DAILY AS NEEDED FOR MUSCLE SPASM 60 tablet 1  . gabapentin (NEURONTIN) 300 MG capsule Take 1 capsule (300 mg total) by mouth 3 (three) times daily. Start with 1 capsule at bedtime then advanced to one capsule twice a day and if need be increased to 1 capsule 3 times a day 90 capsule 5  .  glimepiride (AMARYL) 4 MG tablet Take 1 tablet (4 mg total) by mouth 2 (two) times daily. 180 tablet 3  . glucose blood (ONE TOUCH ULTRA TEST) test strip USE ONE TO CHECK BLOOD SUGARS DAILY E11.9 100 each 3  . lisinopril (PRINIVIL,ZESTRIL) 5 MG tablet Take 1 tablet (5 mg total) by mouth daily. 90 tablet 3  . meclizine (ANTIVERT) 25 MG tablet Take 25 mg by mouth every 6 (six) hours as needed for dizziness. Reported on 01/01/2016    . metFORMIN (GLUCOPHAGE) 500 MG tablet TAKE TWO TABLETS BY MOUTH TWICE DAILY WITH  A  MEAL 360 tablet 3  . metroNIDAZOLE (FLAGYL) 500 MG tablet Take 1 tablet (500 mg total) by mouth 2 (two) times daily. Take twice daily for 7 days. 14 tablet 0  . nystatin (MYCOSTATIN) 100000 UNIT/ML suspension Take 5 cc's by mouth, swish and swallow 4 times daily x 2 weeks. 473 mL 0  . omeprazole (PRILOSEC) 20 MG capsule TAKE 1 CAPSULE BY MOUTH TWICE DAILY BEFORE MEAL(S) 180 capsule 0  . ONETOUCH DELICA LANCETS 50N MISC Use to help check blood sugars daily. E11.9 100 each 1  . PARoxetine (PAXIL) 20 MG tablet TAKE 1 TABLET BY MOUTH ONCE DAILY 90 tablet 0  . promethazine (PHENERGAN) 25 MG tablet TAKE ONE TABLET BY MOUTH EVERY 6 HOURS AS NEEDED FOR NAUSEA 40 tablet 0  . traMADol (ULTRAM) 50 MG tablet Take one tablet twice daily prn severe pain Dx R10.9 14 tablet 0   No current facility-administered medications on file prior to visit.    Review of Systems  Constitutional: Negative for other unusual diaphoresis or sweats HENT: Negative for ear discharge or swelling Eyes: Negative for other worsening visual disturbances Respiratory: Negative for stridor or other swelling  Gastrointestinal: Negative for worsening distension or other blood Genitourinary: Negative for retention or other urinary change Musculoskeletal: Negative for other MSK pain or swelling Skin: Negative for color change or other new lesions Neurological: Negative for worsening tremors and other numbness    Psychiatric/Behavioral: Negative for worsening agitation or other fatigue All other system neg per pt    Objective:   Physical Exam BP 138/88   Pulse 100   Temp 98.2 F (36.8 C) (Oral)   Ht '4\' 11"'$  (1.499 m)   Wt 165 lb (74.8 kg)   SpO2 97%   BMI 33.33 kg/m   VS noted,  Constitutional: Pt is oriented to person, place, and time. Appears well-developed and well-nourished, in no significant distress and comfortable Head: Normocephalic and atraumatic  Eyes: Conjunctivae and EOM are normal. Pupils are equal, round, and reactive to light Right Ear: External ear normal without discharge Left Ear: External ear normal without discharge Nose: Nose without discharge or deformity Mouth/Throat:  Oropharynx is without other ulcerations and moist  Neck: Normal range of motion. Neck supple. No JVD present. No tracheal deviation present or significant neck LA or mass Cardiovascular: Normal rate, regular rhythm, normal heart sounds and intact distal pulses.   Pulmonary/Chest: WOB normal and breath sounds without rales or wheezing  Abdominal: Soft. Bowel sounds are normal. NT. No HSM  Musculoskeletal: Normal range of motion. Exhibits no edema Lymphadenopathy: Has no other cervical adenopathy.  Neurological: Pt is alert and oriented to person, place, and time. Pt has normal reflexes. No cranial nerve deficit. Motor grossly intact, Gait intact Skin: Skin is warm and dry. No rash noted or new ulcerations Psychiatric:  Has normal mood and affect. Behavior is normal without agitation No other exam findings Lab Results  Component Value Date   WBC 4.2 05/09/2018   HGB 10.6 (L) 05/09/2018   HCT 32.5 (L) 05/09/2018   PLT 301.0 05/09/2018   GLUCOSE 118 (H) 05/18/2018   CHOL 159 02/14/2018   TRIG 144.0 02/14/2018   HDL 47.70 02/14/2018   LDLDIRECT 82.3 06/15/2013   LDLCALC 82 02/14/2018   ALT 11 05/09/2018   AST 12 05/09/2018   NA 139 05/18/2018   K 3.9 05/18/2018   CL 106 05/18/2018   CREATININE  1.31 (H) 05/18/2018   BUN 13 05/18/2018   CO2 25 05/18/2018   TSH 2.38 08/03/2017   HGBA1C 5.7 (A) 06/01/2018   HGBA1C 0 06/01/2018   HGBA1C 0 (A) 06/01/2018   HGBA1C 0.0 06/01/2018   MICROALBUR 5.7 (H) 08/03/2017      Assessment & Plan:

## 2018-08-29 ENCOUNTER — Other Ambulatory Visit: Payer: Self-pay | Admitting: Internal Medicine

## 2018-09-02 ENCOUNTER — Other Ambulatory Visit: Payer: Self-pay

## 2018-09-02 NOTE — Patient Outreach (Signed)
[facility]) Care Management  09/02/2018  Cindy Larson [birth year] [id number]   Medication Adherence call to Cindy Larson spoke with patient she is due on Atorvastatin 20 mg and Lisinopril 20 mg she explain she is no longer taking Lisinopril 20 mg she is now taking Lisinopril 5 mg she ask if we can call [facility] an order both medication and will pick up once there ready. Cindy Larson is showing past due under [facility].   [facility] Management Direct Dial [phone]  Fax [phone] [email or web address]

## 2018-09-09 ENCOUNTER — Other Ambulatory Visit: Payer: Self-pay | Admitting: Internal Medicine

## 2018-09-09 DIAGNOSIS — Z1231 Encounter for screening mammogram for malignant neoplasm of breast: Secondary | ICD-10-CM

## 2018-10-07 ENCOUNTER — Ambulatory Visit
Admission: RE | Admit: 2018-10-07 | Discharge: 2018-10-07 | Disposition: A | Discharge status: Home / Self Care | Payer: Medicare Other | Source: Ambulatory Visit | Attending: Internal Medicine | Admitting: Internal Medicine

## 2018-10-07 DIAGNOSIS — Z1231 Encounter for screening mammogram for malignant neoplasm of breast: Secondary | ICD-10-CM | POA: Diagnosis not present

## 2018-10-26 ENCOUNTER — Other Ambulatory Visit: Payer: Self-pay | Admitting: Internal Medicine

## 2018-10-30 ENCOUNTER — Other Ambulatory Visit: Payer: Self-pay | Admitting: Internal Medicine

## 2018-11-11 ENCOUNTER — Other Ambulatory Visit: Payer: Self-pay | Admitting: Internal Medicine

## 2019-01-10 DIAGNOSIS — R000 Tachycardia, unspecified: Secondary | ICD-10-CM | POA: Diagnosis not present

## 2019-01-10 DIAGNOSIS — L299 Pruritus, unspecified: Secondary | ICD-10-CM | POA: Diagnosis not present

## 2019-01-10 DIAGNOSIS — E1165 Type 2 diabetes mellitus with hyperglycemia: Secondary | ICD-10-CM | POA: Diagnosis not present

## 2019-01-10 DIAGNOSIS — T7840XA Allergy, unspecified, initial encounter: Secondary | ICD-10-CM | POA: Diagnosis not present

## 2019-01-10 DIAGNOSIS — I10 Essential (primary) hypertension: Secondary | ICD-10-CM | POA: Diagnosis not present

## 2019-01-27 ENCOUNTER — Other Ambulatory Visit: Payer: Self-pay | Admitting: Internal Medicine

## 2019-01-30 ENCOUNTER — Other Ambulatory Visit: Payer: Self-pay | Admitting: Internal Medicine

## 2019-02-10 ENCOUNTER — Ambulatory Visit: Payer: Medicare Other | Admitting: Internal Medicine

## 2019-02-20 ENCOUNTER — Other Ambulatory Visit: Payer: Self-pay | Admitting: Internal Medicine

## 2019-02-24 ENCOUNTER — Ambulatory Visit (INDEPENDENT_AMBULATORY_CARE_PROVIDER_SITE_OTHER): Payer: Medicare Other | Admitting: Internal Medicine

## 2019-02-24 DIAGNOSIS — R51 Headache: Secondary | ICD-10-CM | POA: Diagnosis not present

## 2019-02-24 DIAGNOSIS — R519 Headache, unspecified: Secondary | ICD-10-CM | POA: Insufficient documentation

## 2019-02-24 MED ORDER — LEVOFLOXACIN 500 MG PO TABS: 500.0000 mg | ORAL_TABLET | Freq: Every day | ORAL | 0 refills | Status: AC

## 2019-02-24 NOTE — Progress Notes (Deleted)
Patient ID: Cindy Larson, female   DOB: May 14, 1946, 73 y.o.   MRN: [id number]  Virtual Visit via Video Note  I connected with Natanya Holecek on 02/24/19 at  4:00 PM EDT by a video enabled telemedicine application and verified that I am speaking with the correct person using two identifiers.  Location: Patient: *** Provider: ***   I discussed the limitations of evaluation and management by telemedicine and the availability of in person appointments. The patient expressed understanding and agreed to proceed.  History of Present Illness:    Observations/Objective:   Assessment and Plan:   Follow Up Instructions:    I discussed the assessment and treatment plan with the patient. The patient was provided an opportunity to ask questions and all were answered. The patient agreed with the plan and demonstrated an understanding of the instructions.   The patient was advised to call back or seek an in-person evaluation if the symptoms worsen or if the condition fails to improve as anticipated.  I provided *** minutes of non-face-to-face time during this encounter.   Cathlean Cower, MD

## 2019-02-24 NOTE — Patient Instructions (Signed)
Please take all new medication as prescribed - the antibiotioc  Please continue all other medications as before, and refills have been done if requested.  Please have the pharmacy call with any other refills you may need.  Please continue your efforts at being more active, low cholesterol diet, and weight control..  Please keep your appointments with your specialists as you may have planned

## 2019-02-26 ENCOUNTER — Encounter: Payer: Self-pay | Admitting: Internal Medicine

## 2019-02-26 NOTE — Assessment & Plan Note (Signed)
See notes

## 2019-02-26 NOTE — Progress Notes (Signed)
Patient ID: Cindy Larson, female   DOB: [birth year], 73 y.o.   MRN: [id number]  Cumulative time during 7-day interval 14 min, there was not an associated office visit for this concern within a 7 day period.  Verbal consent for services obtained from patient prior to services given.  Names of all persons present for services: Cathlean Cower, MD, patient  Chief complaint: left face pain and swelling  History, background, results pertinent:  Here with acute onset 2 -3 days mild to mod low grade temp, left face and swelling that seems to be in the cheek, very tender, worse to talk or chew, has some sinus congestion and minor ST but not worse than usual  Past Medical History:  Diagnosis Date  . ALLERGIC RHINITIS   . ANXIETY   . COLONIC POLYPS, HX OF   . DEPRESSION   . DIABETES MELLITUS, TYPE II   . Diverticulitis   . DIVERTICULOSIS, COLON   . Esophageal stricture   . Gallstones   . GERD   . HIATAL HERNIA   . History of blood transfusion    left knee replacement  . HYPERLIPIDEMIA   . Hypersomnolence   . HYPERTENSION   . INTERMITTENT VERTIGO   . OBESITY   . OSTEOARTHRITIS    L knee  . OSTEOPENIA   . Pneumonia   . Post-menopausal    hormone replacement therapy   No results found for this or any previous visit (from the past 79 hour(s)).   Current Outpatient Medications on File Prior to Visit  Medication Sig Dispense Refill  . ALPRAZolam (XANAX) 0.25 MG tablet Take 1 tablet (0.25 mg total) by mouth 2 (two) times daily as needed. for anxiety 60 tablet 2  . aspirin EC 81 MG tablet Take 1 tablet (81 mg total) by mouth daily. 90 tablet 11  . atorvastatin (LIPITOR) 20 MG tablet TAKE 1 TABLET BY MOUTH ONCE DAILY 90 tablet 1  . Blood Glucose Monitoring Suppl (ONE TOUCH ULTRA 2) w/Device KIT Use as directed once daily E11.9 1 each 0  . Cholecalciferol (VITAMIN D) 2000 UNITS CAPS Take 2,000 Units by mouth daily.     . ciprofloxacin (CIPRO) 500 MG tablet Take 1 tablet (500 mg total)  by mouth 2 (two) times daily. Take twice daily for 7 days. 14 tablet 0  . cyclobenzaprine (FLEXERIL) 5 MG tablet TAKE ONE TABLET BY MOUTH THREE TIMES DAILY AS NEEDED FOR MUSCLE SPASM 60 tablet 1  . gabapentin (NEURONTIN) 300 MG capsule Take 1 capsule (300 mg total) by mouth 3 (three) times daily. Start with 1 capsule at bedtime then advanced to one capsule twice a day and if need be increased to 1 capsule 3 times a day 90 capsule 5  . glimepiride (AMARYL) 4 MG tablet Take 1 tablet by mouth twice daily 180 tablet 0  . glucose blood (ONE TOUCH ULTRA TEST) test strip USE ONE TO CHECK BLOOD SUGARS DAILY E11.9 100 each 3  . lisinopril (PRINIVIL,ZESTRIL) 5 MG tablet Take 1 tablet (5 mg total) by mouth daily. 90 tablet 3  . meclizine (ANTIVERT) 25 MG tablet TAKE ONE TABLET BY MOUTH EVERY 6 HOURS AS NEEDED FOR DIZZINESS 30 tablet 2  . metFORMIN (GLUCOPHAGE) 500 MG tablet TAKE 2 TABLETS BY MOUTH TWICE DAILY WITH A MEAL . APPOINTMENT REQUIRED FOR FUTURE REFILLS 60 tablet 0  . metroNIDAZOLE (FLAGYL) 500 MG tablet Take 1 tablet (500 mg total) by mouth 2 (two) times daily. Take twice daily for 7 days.  14 tablet 0  . nystatin (MYCOSTATIN) 100000 UNIT/ML suspension Take 5 cc's by mouth, swish and swallow 4 times daily x 2 weeks. 473 mL 0  . omeprazole (PRILOSEC) 20 MG capsule TAKE 1 CAPSULE BY MOUTH TWICE DAILY BEFORE MEAL 60 capsule 0  . ONETOUCH DELICA LANCETS 16W MISC Use to help check blood sugars daily. E11.9 100 each 1  . PARoxetine (PAXIL) 20 MG tablet Take 1 tablet by mouth once daily 90 tablet 1  . promethazine (PHENERGAN) 25 MG tablet TAKE ONE TABLET BY MOUTH EVERY 6 HOURS AS NEEDED FOR NAUSEA 40 tablet 0  . traMADol (ULTRAM) 50 MG tablet Take one tablet twice daily prn severe pain Dx R10.9 14 tablet 0   No current facility-administered medications on file prior to visit.    A/P/next steps:   1) left face pain/swelling - I suspect left parotitis, Mild to mod, for antibx course,  to f/u any worsening  symptoms or concerns  Cathlean Cower MD

## 2019-03-06 ENCOUNTER — Other Ambulatory Visit: Payer: Self-pay

## 2019-03-06 NOTE — Patient Outreach (Signed)
[facility]) Care Management  03/06/2019  Cindy Larson May 14, 1946 [id number]   Telephone call to Patient regarding Medication Adherence unable to reach patient.[name] is showing past due under [place].   [facility] Direct Dial [phone]  Fax [phone] [email or web address]

## 2019-03-09 ENCOUNTER — Other Ambulatory Visit: Payer: Self-pay | Admitting: Internal Medicine

## 2019-03-27 ENCOUNTER — Other Ambulatory Visit: Payer: Self-pay | Admitting: Internal Medicine

## 2019-04-12 ENCOUNTER — Ambulatory Visit (INDEPENDENT_AMBULATORY_CARE_PROVIDER_SITE_OTHER): Payer: Medicare Other | Admitting: Internal Medicine

## 2019-04-12 ENCOUNTER — Other Ambulatory Visit: Payer: Self-pay

## 2019-04-12 ENCOUNTER — Other Ambulatory Visit (INDEPENDENT_AMBULATORY_CARE_PROVIDER_SITE_OTHER): Payer: Medicare Other

## 2019-04-12 ENCOUNTER — Encounter: Payer: Self-pay | Admitting: Internal Medicine

## 2019-04-12 VITALS — BP 116/74 | HR 98 | Temp 98.5°F | Ht 59.0 in | Wt 166.0 lb

## 2019-04-12 DIAGNOSIS — E611 Iron deficiency: Secondary | ICD-10-CM | POA: Diagnosis not present

## 2019-04-12 DIAGNOSIS — E119 Type 2 diabetes mellitus without complications: Secondary | ICD-10-CM | POA: Diagnosis not present

## 2019-04-12 DIAGNOSIS — Z0000 Encounter for general adult medical examination without abnormal findings: Secondary | ICD-10-CM | POA: Diagnosis not present

## 2019-04-12 DIAGNOSIS — E559 Vitamin D deficiency, unspecified: Secondary | ICD-10-CM

## 2019-04-12 DIAGNOSIS — E538 Deficiency of other specified B group vitamins: Secondary | ICD-10-CM

## 2019-04-12 LAB — BASIC METABOLIC PANEL
BUN: 22 mg/dL (ref 6–23)
CO2: 22 mEq/L (ref 19–32)
Calcium: 9.4 mg/dL (ref 8.4–10.5)
Chloride: 101 mEq/L (ref 96–112)
Creatinine, Ser: 1.56 mg/dL — ABNORMAL HIGH (ref 0.40–1.20)
GFR: 39.38 mL/min — ABNORMAL LOW (ref >60.00)
Glucose, Bld: 81 mg/dL (ref 70–99)
Potassium: 4.4 mEq/L (ref 3.5–5.1)
Sodium: 135 mEq/L (ref 135–145)

## 2019-04-12 LAB — CBC WITH DIFFERENTIAL/PLATELET
Basophils Absolute: 0 10*3/uL (ref 0.0–0.1)
Basophils Relative: 0.6 % (ref 0.0–3.0)
Eosinophils Absolute: 0.1 10*3/uL (ref 0.0–0.7)
Eosinophils Relative: 1.7 % (ref 0.0–5.0)
HCT: 34 % — ABNORMAL LOW (ref 36.0–46.0)
Hemoglobin: 10.9 g/dL — ABNORMAL LOW (ref 12.0–15.0)
Lymphocytes Relative: 51.1 % — ABNORMAL HIGH (ref 12.0–46.0)
Lymphs Abs: 3 10*3/uL (ref 0.7–4.0)
MCHC: 32.2 g/dL (ref 30.0–36.0)
MCV: 88.5 fl (ref 78.0–100.0)
Monocytes Absolute: 0.4 10*3/uL (ref 0.1–1.0)
Monocytes Relative: 7.3 % (ref 3.0–12.0)
Neutro Abs: 2.3 10*3/uL (ref 1.4–7.7)
Neutrophils Relative %: 39.3 % — ABNORMAL LOW (ref 43.0–77.0)
Platelets: 298 10*3/uL (ref 150.0–400.0)
RBC: 3.84 Mil/uL — ABNORMAL LOW (ref 3.87–5.11)
RDW: 14.2 % (ref 11.5–15.5)
WBC: 6 10*3/uL (ref 4.0–10.5)

## 2019-04-12 LAB — HEPATIC FUNCTION PANEL
ALT: 15 U/L (ref 0–35)
AST: 16 U/L (ref 0–37)
Albumin: 4.4 g/dL (ref 3.5–5.2)
Alkaline Phosphatase: 58 U/L (ref 39–117)
Bilirubin, Direct: 0.1 mg/dL (ref 0.0–0.3)
Total Bilirubin: 0.6 mg/dL (ref 0.2–1.2)
Total Protein: 6.9 g/dL (ref 6.0–8.3)

## 2019-04-12 LAB — LIPID PANEL
Cholesterol: 157 mg/dL (ref 0–200)
HDL: 52.2 mg/dL (ref >39.00)
LDL Cholesterol: 79 mg/dL (ref 0–99)
NonHDL: 104.37
Total CHOL/HDL Ratio: 3
Triglycerides: 127 mg/dL (ref 0.0–149.0)
VLDL: 25.4 mg/dL (ref 0.0–40.0)

## 2019-04-12 LAB — MICROALBUMIN / CREATININE URINE RATIO
Creatinine,U: 157.8 mg/dL
Microalb Creat Ratio: 1.8 mg/g (ref 0.0–30.0)
Microalb, Ur: 2.9 mg/dL — ABNORMAL HIGH (ref 0.0–1.9)

## 2019-04-12 LAB — IBC PANEL
Iron: 72 ug/dL (ref 42–145)
Saturation Ratios: 15.7 % — ABNORMAL LOW (ref 20.0–50.0)
Transferrin: 328 mg/dL (ref 212.0–360.0)

## 2019-04-12 LAB — TSH: TSH: 1.32 u[IU]/mL (ref 0.35–4.50)

## 2019-04-12 LAB — HEMOGLOBIN A1C: Hgb A1c MFr Bld: 6.6 % — ABNORMAL HIGH (ref 4.6–6.5)

## 2019-04-12 MED ORDER — OMEPRAZOLE 20 MG PO CPDR: DELAYED_RELEASE_CAPSULE | ORAL | 3 refills | Status: DC

## 2019-04-12 MED ORDER — GABAPENTIN 300 MG PO CAPS: 300.0000 mg | ORAL_CAPSULE | Freq: Three times a day (TID) | ORAL | 1 refills | Status: DC

## 2019-04-12 MED ORDER — LISINOPRIL 5 MG PO TABS: 5.0000 mg | ORAL_TABLET | Freq: Every day | ORAL | 3 refills | Status: DC

## 2019-04-12 MED ORDER — ALPRAZOLAM 0.25 MG PO TABS: 0.2500 mg | ORAL_TABLET | Freq: Two times a day (BID) | ORAL | 5 refills | Status: DC | PRN

## 2019-04-12 MED ORDER — METFORMIN HCL 500 MG PO TABS: ORAL_TABLET | ORAL | 3 refills | Status: DC

## 2019-04-12 MED ORDER — PAROXETINE HCL 20 MG PO TABS: 20.0000 mg | ORAL_TABLET | Freq: Every day | ORAL | 3 refills | Status: AC

## 2019-04-12 MED ORDER — GLIMEPIRIDE 4 MG PO TABS: 4.0000 mg | ORAL_TABLET | Freq: Two times a day (BID) | ORAL | 3 refills | Status: DC

## 2019-04-12 MED ORDER — ATORVASTATIN CALCIUM 20 MG PO TABS: 20.0000 mg | ORAL_TABLET | Freq: Every day | ORAL | 3 refills | Status: DC

## 2019-04-12 NOTE — Progress Notes (Signed)
Subjective:    Patient ID: Cindy Larson, female    DOB: 07/10/1946, 73 y.o.   MRN: [id number]  HPI  Here for wellness and f/u;  Overall doing ok;  Pt denies Chest pain, worsening SOB, DOE, wheezing, orthopnea, PND, worsening LE edema, palpitations, dizziness or syncope.  Pt denies neurological change such as new headache, facial or extremity weakness.  Pt denies polydipsia, polyuria, or low sugar symptoms. Pt states overall good compliance with treatment and medications, good tolerability, and has been trying to follow appropriate diet.  Pt denies worsening depressive symptoms, suicidal ideation or panic. No fever, night sweats, wt loss, loss of appetite, or other constitutional symptoms.  Pt states good ability with ADL's, has low fall risk, home safety reviewed and adequate, no other significant changes in hearing or vision, and only occasionally active with exercise. No new complaints Wt Readings from Last 3 Encounters:  04/12/19 166 lb (75.3 kg)  08/16/18 165 lb (74.8 kg)  06/01/18 161 lb (73 kg)   Past Medical History:  Diagnosis Date  . ALLERGIC RHINITIS   . ANXIETY   . COLONIC POLYPS, HX OF   . DEPRESSION   . DIABETES MELLITUS, TYPE II   . Diverticulitis   . DIVERTICULOSIS, COLON   . Esophageal stricture   . Gallstones   . GERD   . HIATAL HERNIA   . History of blood transfusion    left knee replacement  . HYPERLIPIDEMIA   . Hypersomnolence   . HYPERTENSION   . INTERMITTENT VERTIGO   . OBESITY   . OSTEOARTHRITIS    L knee  . OSTEOPENIA   . Pneumonia   . Post-menopausal    hormone replacement therapy   Past Surgical History:  Procedure Laterality Date  . ABDOMINAL HYSTERECTOMY    . CESAREAN SECTION    . CHOLECYSTECTOMY    . Lysis of Adhesions    . OOPHORECTOMY     one  . ROTATOR CUFF REPAIR Left   . TOTAL KNEE ARTHROPLASTY Left     reports that she has never smoked. She has never used smokeless tobacco. She reports that she does not drink alcohol or  use drugs. family history includes Atrial fibrillation in her mother; Breast cancer in her maternal grandmother; Cancer in her mother; Colon cancer in her paternal aunt; Diabetes in an other family member; Heart disease in her mother; Heart failure in her brother; Hypertension in her mother; Kidney disease in her maternal aunt; Lung cancer in her brother; Ovarian cancer in her sister. Allergies  Allergen Reactions  . Aspirin Other (See Comments)    Stomach hurts  . Penicillins Hives    blisters  . Prevnar [Pneumococcal 13-Val Conj Vacc] Swelling    Left arm swelling  . Zocor [Simvastatin]     REACTION: itch   Current Outpatient Medications on File Prior to Visit  Medication Sig Dispense Refill  . aspirin EC 81 MG tablet Take 1 tablet (81 mg total) by mouth daily. 90 tablet 11  . Blood Glucose Monitoring Suppl (ONE TOUCH ULTRA 2) w/Device KIT Use as directed once daily E11.9 1 each 0  . Cholecalciferol (VITAMIN D) 2000 UNITS CAPS Take 2,000 Units by mouth daily.     . ciprofloxacin (CIPRO) 500 MG tablet Take 1 tablet (500 mg total) by mouth 2 (two) times daily. Take twice daily for 7 days. 14 tablet 0  . cyclobenzaprine (FLEXERIL) 5 MG tablet TAKE ONE TABLET BY MOUTH THREE TIMES DAILY AS NEEDED FOR  MUSCLE SPASM 60 tablet 1  . glucose blood (ONE TOUCH ULTRA TEST) test strip USE ONE TO CHECK BLOOD SUGARS DAILY E11.9 100 each 3  . meclizine (ANTIVERT) 25 MG tablet TAKE ONE TABLET BY MOUTH EVERY 6 HOURS AS NEEDED FOR DIZZINESS 30 tablet 2  . metroNIDAZOLE (FLAGYL) 500 MG tablet Take 1 tablet (500 mg total) by mouth 2 (two) times daily. Take twice daily for 7 days. 14 tablet 0  . nystatin (MYCOSTATIN) 100000 UNIT/ML suspension Take 5 cc's by mouth, swish and swallow 4 times daily x 2 weeks. 473 mL 0  . ONETOUCH DELICA LANCETS 93A MISC Use to help check blood sugars daily. E11.9 100 each 1  . promethazine (PHENERGAN) 25 MG tablet TAKE ONE TABLET BY MOUTH EVERY 6 HOURS AS NEEDED FOR NAUSEA 40 tablet  0  . traMADol (ULTRAM) 50 MG tablet Take one tablet twice daily prn severe pain Dx R10.9 14 tablet 0   No current facility-administered medications on file prior to visit.    Review of Systems Constitutional: Negative for other unusual diaphoresis, sweats, appetite or weight changes HENT: Negative for other worsening hearing loss, ear pain, facial swelling, mouth sores or neck stiffness.   Eyes: Negative for other worsening pain, redness or other visual disturbance.  Respiratory: Negative for other stridor or swelling Cardiovascular: Negative for other palpitations or other chest pain  Gastrointestinal: Negative for worsening diarrhea or loose stools, blood in stool, distention or other pain Genitourinary: Negative for hematuria, flank pain or other change in urine volume.  Musculoskeletal: Negative for myalgias or other joint swelling.  Skin: Negative for other color change, or other wound or worsening drainage.  Neurological: Negative for other syncope or numbness. Hematological: Negative for other adenopathy or swelling Psychiatric/Behavioral: Negative for hallucinations, other worsening agitation, SI, self-injury, or new decreased concentration ALl other system neg per pt    Objective:   Physical Exam BP 116/74   Pulse 98   Temp 98.5 F (36.9 C) (Oral)   Ht '4\' 11"'$  (1.499 m)   Wt 166 lb (75.3 kg)   SpO2 99%   BMI 33.53 kg/m  VS noted,  Constitutional: Pt is oriented to person, place, and time. Appears well-developed and well-nourished, in no significant distress and comfortable Head: Normocephalic and atraumatic  Eyes: Conjunctivae and EOM are normal. Pupils are equal, round, and reactive to light Right Ear: External ear normal without discharge Left Ear: External ear normal without discharge Nose: Nose without discharge or deformity Mouth/Throat: Oropharynx is without other ulcerations and moist  Neck: Normal range of motion. Neck supple. No JVD present. No tracheal  deviation present or significant neck LA or mass Cardiovascular: Normal rate, regular rhythm, normal heart sounds and intact distal pulses.   Pulmonary/Chest: WOB normal and breath sounds without rales or wheezing  Abdominal: Soft. Bowel sounds are normal. NT. No HSM  Musculoskeletal: Normal range of motion. Exhibits no edema Lymphadenopathy: Has no other cervical adenopathy.  Neurological: Pt is alert and oriented to person, place, and time. Pt has normal reflexes. No cranial nerve deficit. Motor grossly intact, Gait intact Skin: Skin is warm and dry. No rash noted or new ulcerations Psychiatric:  Has normal mood and affect. Behavior is normal without agitation No other exam findings Lab Results  Component Value Date   WBC 5.5 08/16/2018   HGB 10.9 (L) 08/16/2018   HCT 32.7 (L) 08/16/2018   PLT 305.0 08/16/2018   GLUCOSE 106 (H) 08/16/2018   CHOL 134 08/16/2018   TRIG  105.0 08/16/2018   HDL 55.00 08/16/2018   LDLDIRECT 82.3 06/15/2013   LDLCALC 58 08/16/2018   ALT 11 08/16/2018   AST 11 08/16/2018   NA 139 08/16/2018   K 4.2 08/16/2018   CL 103 08/16/2018   CREATININE 1.34 (H) 08/16/2018   BUN 18 08/16/2018   CO2 26 08/16/2018   TSH 1.91 08/16/2018   HGBA1C 6.0 08/16/2018   MICROALBUR 3.0 (H) 08/16/2018      Assessment & Plan:

## 2019-04-12 NOTE — Patient Instructions (Addendum)
Please remember to call for your eye doctor appt  Please continue all other medications as before, and refills have been done if requested.  Please have the pharmacy call with any other refills you may need.  Please continue your efforts at being more active, low cholesterol diet, and weight control.  You are otherwise up to date with prevention measures today.  Please keep your appointments with your specialists as you may have planned  Please go to the LAB in the Basement (turn left off the elevator) for the tests to be done today  You will be contacted by phone if any changes need to be made immediately.  Otherwise, you will receive a letter about your results with an explanation, but please check with MyChart first.  Please remember to sign up for MyChart if you have not done so, as this will be important to you in the future with finding out test results, communicating by private email, and scheduling acute appointments online when needed.  Please return in 6 months, or sooner if needed, with Lab testing done 3-5 days before

## 2019-04-12 NOTE — Assessment & Plan Note (Signed)

## 2019-04-12 NOTE — Assessment & Plan Note (Signed)
stable overall by history and exam, recent data reviewed with pt, and pt to continue medical treatment as before,  to f/u any worsening symptoms or concerns  

## 2019-04-13 LAB — URINALYSIS, ROUTINE W REFLEX MICROSCOPIC
Bilirubin Urine: NEGATIVE
Hgb urine dipstick: NEGATIVE
Leukocytes,Ua: NEGATIVE
Nitrite: NEGATIVE
RBC / HPF: NONE SEEN (ref 0–?)
Specific Gravity, Urine: 1.025 (ref 1.000–1.030)
Total Protein, Urine: NEGATIVE
Urine Glucose: NEGATIVE
Urobilinogen, UA: 0.2 (ref 0.0–1.0)
pH: 5.5 (ref 5.0–8.0)

## 2019-04-13 LAB — VITAMIN D 25 HYDROXY (VIT D DEFICIENCY, FRACTURES): VITD: 35.61 ng/mL (ref 30.00–100.00)

## 2019-04-13 LAB — VITAMIN B12: Vitamin B-12: 424 pg/mL (ref 211–911)

## 2019-05-10 DIAGNOSIS — R531 Weakness: Secondary | ICD-10-CM | POA: Diagnosis not present

## 2019-05-10 DIAGNOSIS — R0902 Hypoxemia: Secondary | ICD-10-CM | POA: Diagnosis not present

## 2019-05-10 DIAGNOSIS — E1165 Type 2 diabetes mellitus with hyperglycemia: Secondary | ICD-10-CM | POA: Diagnosis not present

## 2019-05-10 DIAGNOSIS — R05 Cough: Secondary | ICD-10-CM | POA: Diagnosis not present

## 2019-05-18 ENCOUNTER — Other Ambulatory Visit: Payer: Self-pay | Admitting: Internal Medicine

## 2019-05-21 ENCOUNTER — Other Ambulatory Visit: Payer: Self-pay | Admitting: Internal Medicine

## 2019-05-22 NOTE — Telephone Encounter (Signed)
Done erx 

## 2019-05-24 ENCOUNTER — Ambulatory Visit (INDEPENDENT_AMBULATORY_CARE_PROVIDER_SITE_OTHER): Payer: Medicare Other

## 2019-05-24 ENCOUNTER — Other Ambulatory Visit: Payer: Self-pay

## 2019-05-24 DIAGNOSIS — Z23 Encounter for immunization: Secondary | ICD-10-CM | POA: Diagnosis not present

## 2019-06-18 DIAGNOSIS — I10 Essential (primary) hypertension: Secondary | ICD-10-CM | POA: Diagnosis not present

## 2019-06-18 DIAGNOSIS — E1165 Type 2 diabetes mellitus with hyperglycemia: Secondary | ICD-10-CM | POA: Diagnosis not present

## 2019-09-25 ENCOUNTER — Other Ambulatory Visit: Payer: Self-pay | Admitting: Internal Medicine

## 2019-09-25 DIAGNOSIS — Z1231 Encounter for screening mammogram for malignant neoplasm of breast: Secondary | ICD-10-CM

## 2019-10-06 ENCOUNTER — Other Ambulatory Visit: Payer: Self-pay | Admitting: Internal Medicine

## 2019-10-06 NOTE — Telephone Encounter (Signed)
Done erx 

## 2019-10-07 ENCOUNTER — Other Ambulatory Visit: Payer: Self-pay | Admitting: Internal Medicine

## 2019-10-10 DIAGNOSIS — H16223 Keratoconjunctivitis sicca, not specified as Sjogren's, bilateral: Secondary | ICD-10-CM | POA: Diagnosis not present

## 2019-10-10 DIAGNOSIS — H40033 Anatomical narrow angle, bilateral: Secondary | ICD-10-CM | POA: Diagnosis not present

## 2019-10-13 ENCOUNTER — Ambulatory Visit: Payer: Medicare Other | Attending: Internal Medicine

## 2019-10-13 DIAGNOSIS — Z23 Encounter for immunization: Secondary | ICD-10-CM | POA: Insufficient documentation

## 2019-10-13 NOTE — Progress Notes (Signed)
   Covid-19 Vaccination Clinic  Name:  Avree Szczygiel    MRN: [id number] DOB: Mar 01, 1946  10/13/2019  Ms. Botelho was observed post Covid-19 immunization for 15 minutes without incidence. She was provided with Vaccine Information Sheet and instruction to access the V-Safe system.   Ms. Miano was instructed to call 911 with any severe reactions post vaccine: Marland Kitchen Difficulty breathing  . Swelling of your face and throat  . A fast heartbeat  . A bad rash all over your body  . Dizziness and weakness    Immunizations Administered    Name Date Dose VIS Date Route   Pfizer COVID-19 Vaccine 10/13/2019  6:12 PM 0.3 mL 08/11/2019 Intramuscular   Manufacturer: [place]   Lot: [id number]   [place]: [id number]

## 2019-10-18 ENCOUNTER — Ambulatory Visit: Payer: Medicare Other | Admitting: Internal Medicine

## 2019-10-31 ENCOUNTER — Ambulatory Visit
Admission: RE | Admit: 2019-10-31 | Discharge: 2019-10-31 | Disposition: A | Discharge status: Home / Self Care | Payer: Medicare Other | Source: Ambulatory Visit | Attending: Internal Medicine | Admitting: Internal Medicine

## 2019-10-31 ENCOUNTER — Other Ambulatory Visit: Payer: Self-pay

## 2019-10-31 DIAGNOSIS — Z1231 Encounter for screening mammogram for malignant neoplasm of breast: Secondary | ICD-10-CM

## 2019-11-05 ENCOUNTER — Ambulatory Visit: Payer: Medicare Other | Attending: Internal Medicine

## 2019-11-05 DIAGNOSIS — Z23 Encounter for immunization: Secondary | ICD-10-CM | POA: Insufficient documentation

## 2019-11-05 NOTE — Progress Notes (Signed)
   Covid-19 Vaccination Clinic  Name:  Cindy Larson    MRN: [id number] DOB: 07-28-1946  11/05/2019  [name] was observed post Covid-19 immunization for 15 minutes without incident. She was provided with Vaccine Information Sheet and instruction to access the V-Safe system.   [name] was instructed to call 911 with any severe reactions post vaccine: Marland Kitchen Difficulty breathing  . Swelling of face and throat  . A fast heartbeat  . A bad rash all over body  . Dizziness and weakness   Immunizations Administered    Name Date Dose VIS Date Route   Pfizer COVID-19 Vaccine 11/05/2019  1:51 PM 0.3 mL 08/11/2019 Intramuscular   Manufacturer: [place]   Lot: [id number]   [place]: [address]-9

## 2019-11-15 ENCOUNTER — Other Ambulatory Visit: Payer: Self-pay | Admitting: Internal Medicine

## 2019-11-15 NOTE — Telephone Encounter (Signed)
Done erx 

## 2019-11-19 DIAGNOSIS — R0902 Hypoxemia: Secondary | ICD-10-CM | POA: Diagnosis not present

## 2019-11-19 DIAGNOSIS — R1084 Generalized abdominal pain: Secondary | ICD-10-CM | POA: Diagnosis not present

## 2019-11-19 DIAGNOSIS — I10 Essential (primary) hypertension: Secondary | ICD-10-CM | POA: Diagnosis not present

## 2019-11-19 DIAGNOSIS — R52 Pain, unspecified: Secondary | ICD-10-CM | POA: Diagnosis not present

## 2019-11-19 DIAGNOSIS — Z743 Need for continuous supervision: Secondary | ICD-10-CM | POA: Diagnosis not present

## 2020-01-02 ENCOUNTER — Other Ambulatory Visit: Payer: Self-pay

## 2020-01-02 ENCOUNTER — Ambulatory Visit (INDEPENDENT_AMBULATORY_CARE_PROVIDER_SITE_OTHER): Payer: Medicare Other | Admitting: Internal Medicine

## 2020-01-02 DIAGNOSIS — J309 Allergic rhinitis, unspecified: Secondary | ICD-10-CM | POA: Diagnosis not present

## 2020-01-02 DIAGNOSIS — E119 Type 2 diabetes mellitus without complications: Secondary | ICD-10-CM

## 2020-01-02 DIAGNOSIS — J019 Acute sinusitis, unspecified: Secondary | ICD-10-CM | POA: Diagnosis not present

## 2020-01-02 MED ORDER — DOXYCYCLINE HYCLATE 100 MG PO TABS: 100.0000 mg | ORAL_TABLET | Freq: Two times a day (BID) | ORAL | 0 refills | Status: DC

## 2020-01-02 MED ORDER — PREDNISONE 10 MG PO TABS: ORAL_TABLET | ORAL | 0 refills | Status: DC

## 2020-01-02 NOTE — Progress Notes (Addendum)
Patient ID: Cindy Larson, female   DOB: 1945-10-17, 74 y.o.   MRN: [id number]  Cumulative time during 7-day interval 12 min, there was not an associated office visit for this concern within a 7 day period.  Verbal consent for services obtained from patient prior to services given.  Names of all persons present for services: Cathlean Cower, MD, patient  Chief complaint: sinus symptoms  History, background, results pertinent:  Here with 2-3 days acute onset fever, facial pain, pressure, headache, general weakness and malaise, and greenish d/c, with mild ST and cough, but pt denies chest pain, wheezing, increased sob or doe, orthopnea, PND, increased LE swelling, palpitations, dizziness or syncope. Does have several wks ongoing nasal allergy symptoms with clearish congestion, itch and sneezing, without fever, pain, ST, cough, swelling or wheezing.    Past Medical History:  Diagnosis Date  . ALLERGIC RHINITIS   . ANXIETY   . COLONIC POLYPS, HX OF   . DEPRESSION   . DIABETES MELLITUS, TYPE II   . Diverticulitis   . DIVERTICULOSIS, COLON   . Esophageal stricture   . Gallstones   . GERD   . HIATAL HERNIA   . History of blood transfusion    left knee replacement  . HYPERLIPIDEMIA   . Hypersomnolence   . HYPERTENSION   . INTERMITTENT VERTIGO   . OBESITY   . OSTEOARTHRITIS    L knee  . OSTEOPENIA   . Pneumonia   . Post-menopausal    hormone replacement therapy   Past Surgical History:  Procedure Laterality Date  . ABDOMINAL HYSTERECTOMY    . CESAREAN SECTION    . CHOLECYSTECTOMY    . Lysis of Adhesions    . OOPHORECTOMY     one  . ROTATOR CUFF REPAIR Left   . TOTAL KNEE ARTHROPLASTY Left     reports that she has never smoked. She has never used smokeless tobacco. She reports that she does not drink alcohol or use drugs. family history includes Atrial fibrillation in her mother; Breast cancer in her maternal grandmother; Cancer in her mother; Colon cancer in her paternal  aunt; Diabetes in an other family member; Heart disease in her mother; Heart failure in her brother; Hypertension in her mother; Kidney disease in her maternal aunt; Lung cancer in her brother; Ovarian cancer in her sister. Allergies  Allergen Reactions  . Aspirin Other (See Comments)    Stomach hurts  . Penicillins Hives    blisters  . Prevnar [Pneumococcal 13-Val Conj Vacc] Swelling    Left arm swelling  . Zocor [Simvastatin]     REACTION: itch   Current Outpatient Medications on File Prior to Visit  Medication Sig Dispense Refill  . ALPRAZolam (XANAX) 0.25 MG tablet Take 1 tablet by mouth twice daily as needed for anxiety 60 tablet 5  . aspirin EC 81 MG tablet Take 1 tablet (81 mg total) by mouth daily. 90 tablet 11  . atorvastatin (LIPITOR) 20 MG tablet Take 1 tablet (20 mg total) by mouth daily. 90 tablet 3  . Blood Glucose Monitoring Suppl (ONE TOUCH ULTRA 2) w/Device KIT Use as directed once daily E11.9 1 each 0  . Cholecalciferol (VITAMIN D) 2000 UNITS CAPS Take 2,000 Units by mouth daily.     . ciprofloxacin (CIPRO) 500 MG tablet Take 1 tablet (500 mg total) by mouth 2 (two) times daily. Take twice daily for 7 days. 14 tablet 0  . cyclobenzaprine (FLEXERIL) 5 MG tablet TAKE ONE TABLET BY MOUTH  THREE TIMES DAILY AS NEEDED FOR MUSCLE SPASM 60 tablet 1  . gabapentin (NEURONTIN) 300 MG capsule Take 1 capsule (300 mg total) by mouth 3 (three) times daily. Start with 1 capsule at bedtime then advanced to one capsule twice a day and if need be increased to 1 capsule 3 times a day 270 capsule 1  . glimepiride (AMARYL) 4 MG tablet Take 1 tablet by mouth twice daily 180 tablet 1  . glucose blood (ONE TOUCH ULTRA TEST) test strip USE ONE TO CHECK BLOOD SUGARS DAILY E11.9 100 each 3  . lisinopril (ZESTRIL) 5 MG tablet Take 1 tablet (5 mg total) by mouth daily. 90 tablet 3  . meclizine (ANTIVERT) 25 MG tablet TAKE ONE TABLET BY MOUTH EVERY 6 HOURS AS NEEDED FOR DIZZINESS 30 tablet 2  .  metFORMIN (GLUCOPHAGE) 500 MG tablet 1 tab by mouth twice per day 180 tablet 3  . metroNIDAZOLE (FLAGYL) 500 MG tablet Take 1 tablet (500 mg total) by mouth 2 (two) times daily. Take twice daily for 7 days. 14 tablet 0  . nystatin (MYCOSTATIN) 100000 UNIT/ML suspension Take 5 cc's by mouth, swish and swallow 4 times daily x 2 weeks. 473 mL 0  . omeprazole (PRILOSEC) 20 MG capsule TAKE 1 CAPSULE BY MOUTH TWICE DAILY BEFORE MEAL(S) 180 capsule 3  . ONETOUCH DELICA LANCETS 67O MISC Use to help check blood sugars daily. E11.9 100 each 1  . PARoxetine (PAXIL) 20 MG tablet Take 1 tablet (20 mg total) by mouth daily. 90 tablet 3  . promethazine (PHENERGAN) 25 MG tablet TAKE ONE TABLET BY MOUTH EVERY 6 HOURS AS NEEDED FOR NAUSEA 40 tablet 0  . traMADol (ULTRAM) 50 MG tablet TAKE 1 TABLET BY MOUTH EVERY 6 HOURS AS NEEDED 60 tablet 0   No current facility-administered medications on file prior to visit.   Lab Results  Component Value Date   WBC 6.0 04/12/2019   HGB 10.9 (L) 04/12/2019   HCT 34.0 (L) 04/12/2019   PLT 298.0 04/12/2019   GLUCOSE 81 04/12/2019   CHOL 157 04/12/2019   TRIG 127.0 04/12/2019   HDL 52.20 04/12/2019   LDLDIRECT 82.3 06/15/2013   LDLCALC 79 04/12/2019   ALT 15 04/12/2019   AST 16 04/12/2019   NA 135 04/12/2019   K 4.4 04/12/2019   CL 101 04/12/2019   CREATININE 1.56 (H) 04/12/2019   BUN 22 04/12/2019   CO2 22 04/12/2019   TSH 1.32 04/12/2019   HGBA1C 6.6 (H) 04/12/2019   MICROALBUR 2.9 (H) 04/12/2019    A/P/next steps:    See notes  Cathlean Cower MD

## 2020-01-03 ENCOUNTER — Encounter: Payer: Self-pay | Admitting: Internal Medicine

## 2020-01-03 DIAGNOSIS — J019 Acute sinusitis, unspecified: Secondary | ICD-10-CM | POA: Insufficient documentation

## 2020-01-03 NOTE — Patient Instructions (Signed)
Please take all new medication as prescribed 

## 2020-01-03 NOTE — Assessment & Plan Note (Signed)
Mild to mod, for antibx course,  to f/u any worsening symptoms or concerns 

## 2020-01-03 NOTE — Assessment & Plan Note (Signed)
With seasonal flare, for predpac asd,  to f/u any worsening symptoms or concerns 

## 2020-01-24 ENCOUNTER — Ambulatory Visit (INDEPENDENT_AMBULATORY_CARE_PROVIDER_SITE_OTHER): Payer: Medicare Other

## 2020-01-24 DIAGNOSIS — Z0000 Encounter for general adult medical examination without abnormal findings: Secondary | ICD-10-CM | POA: Diagnosis not present

## 2020-01-24 NOTE — Progress Notes (Signed)
I connected with Cindy Larson today by telephone and verified that I am speaking with the correct person using two identifiers. Location patient: home Location provider: work Persons participating in the virtual visit: Cindy Larson and Cindy Abu, LPN   I discussed the limitations, risks, security and privacy concerns of performing an evaluation and management service by telephone and the availability of in person appointments. I also discussed with the patient that there may be a patient responsible charge related to this service. The patient expressed understanding and verbally consented to this telephonic visit.    Interactive audio and video telecommunications were attempted between this provider and patient, however failed, due to patient having technical difficulties OR patient did not have access to video capability.  We continued and completed visit with audio only.  Some vital signs may be absent or patient reported.   Time Spent with patient on telephone encounter: 25 minutes  Subjective:   Cindy Larson is a 74 y.o. female who presents for Medicare Annual (Subsequent) preventive examination.  Review of Systems:  No ROS. Medicare Wellness Exam Cardiac Risk Factors include: advanced age (>65mn, >>72women);diabetes mellitus;dyslipidemia;family history of premature cardiovascular disease;hypertension;obesity (BMI >30kg/m2)     Objective:     Vitals: There were no vitals taken for this visit.  There is no height or weight on file to calculate BMI.  Advanced Directives 01/24/2020 02/03/2017 01/16/2015  Does Patient Have a Medical Advance Directive? No No Yes  Copy of [place] Chart? - - No - copy requested  Would patient like information on creating a medical advance directive? No - Patient declined - -    Tobacco Social History   Tobacco Use  Smoking Status Never Smoker  Smokeless Tobacco Never Used     Counseling  given: Not Answered   Clinical Intake:  Pre-visit preparation completed: Yes  Pain : No/denies pain     Nutritional Risks: None Diabetes: Yes CBG done?: No Did pt. bring in CBG monitor from home?: No  How often do you need to have someone help you when you read instructions, pamphlets, or other written materials from your doctor or pharmacy?: 1 - Never What is the last grade level you completed in school?: High School Graduate  Interpreter Needed?: No  Information entered by :: SSheral Flow LPN  Past Medical History:  Diagnosis Date  . ALLERGIC RHINITIS   . ANXIETY   . COLONIC POLYPS, HX OF   . DEPRESSION   . DIABETES MELLITUS, TYPE II   . Diverticulitis   . DIVERTICULOSIS, COLON   . Esophageal stricture   . Gallstones   . GERD   . HIATAL HERNIA   . History of blood transfusion    left knee replacement  . HYPERLIPIDEMIA   . Hypersomnolence   . HYPERTENSION   . INTERMITTENT VERTIGO   . OBESITY   . OSTEOARTHRITIS    L knee  . OSTEOPENIA   . Pneumonia   . Post-menopausal    hormone replacement therapy   Past Surgical History:  Procedure Laterality Date  . ABDOMINAL HYSTERECTOMY    . CESAREAN SECTION    . CHOLECYSTECTOMY    . Lysis of Adhesions    . OOPHORECTOMY     one  . ROTATOR CUFF REPAIR Left   . TOTAL KNEE ARTHROPLASTY Left    Family History  Problem Relation Age of Onset  . Hypertension Mother   . Atrial fibrillation Mother   .  Heart disease Mother        CHF  . Cancer Mother        Cancer in "thigh"  . Colon cancer Paternal Aunt        originated as breast CA  . Lung cancer Brother        smoker  . Diabetes Other        paternal Aunt and uncle  . Breast cancer Maternal Grandmother   . Ovarian cancer Sister   . Heart failure Brother        CHF  . Kidney disease Maternal Aunt    Social History   Socioeconomic History  . Marital status: Married    Spouse name: Not on file  . Number of children: 3  . Years of education: Not  on file  . Highest education level: Not on file  Occupational History  . Occupation: Control and instrumentation engineer  Tobacco Use  . Smoking status: Never Smoker  . Smokeless tobacco: Never Used  Substance and Sexual Activity  . Alcohol use: No  . Drug use: No  . Sexual activity: Not on file  Other Topics Concern  . Not on file  Social History Narrative  . Not on file   Social Determinants of Health   Financial Resource Strain:   . Difficulty of Paying Living Expenses:   Food Insecurity:   . Worried About Charity fundraiser in the Last Year:   . Arboriculturist in the Last Year:   Transportation Needs:   . Film/video editor (Medical):   Marland Kitchen Lack of Transportation (Non-Medical):   Physical Activity:   . Days of Exercise per Week:   . Minutes of Exercise per Session:   Stress:   . Feeling of Stress :   Social Connections:   . Frequency of Communication with Friends and Family:   . Frequency of Social Gatherings with Friends and Family:   . Attends Religious Services:   . Active Member of Clubs or Organizations:   . Attends Archivist Meetings:   Marland Kitchen Marital Status:     Outpatient Encounter Medications as of 01/24/2020  Medication Sig  . ALPRAZolam (XANAX) 0.25 MG tablet Take 1 tablet by mouth twice daily as needed for anxiety  . aspirin EC 81 MG tablet Take 1 tablet (81 mg total) by mouth daily.  Marland Kitchen atorvastatin (LIPITOR) 20 MG tablet Take 1 tablet (20 mg total) by mouth daily.  . Blood Glucose Monitoring Suppl (ONE TOUCH ULTRA 2) w/Device KIT Use as directed once daily E11.9  . Cholecalciferol (VITAMIN D) 2000 UNITS CAPS Take 2,000 Units by mouth daily.   . cyclobenzaprine (FLEXERIL) 5 MG tablet TAKE ONE TABLET BY MOUTH THREE TIMES DAILY AS NEEDED FOR MUSCLE SPASM  . gabapentin (NEURONTIN) 300 MG capsule Take 1 capsule (300 mg total) by mouth 3 (three) times daily. Start with 1 capsule at bedtime then advanced to one capsule twice a day and if need be increased to 1 capsule 3  times a day  . glimepiride (AMARYL) 4 MG tablet Take 1 tablet by mouth twice daily  . glucose blood (ONE TOUCH ULTRA TEST) test strip USE ONE TO CHECK BLOOD SUGARS DAILY E11.9  . lisinopril (ZESTRIL) 5 MG tablet Take 1 tablet (5 mg total) by mouth daily.  . meclizine (ANTIVERT) 25 MG tablet TAKE ONE TABLET BY MOUTH EVERY 6 HOURS AS NEEDED FOR DIZZINESS  . metFORMIN (GLUCOPHAGE) 500 MG tablet 1 tab by mouth twice per  day  . metroNIDAZOLE (FLAGYL) 500 MG tablet Take 1 tablet (500 mg total) by mouth 2 (two) times daily. Take twice daily for 7 days.  Marland Kitchen nystatin (MYCOSTATIN) 100000 UNIT/ML suspension Take 5 cc's by mouth, swish and swallow 4 times daily x 2 weeks.  Marland Kitchen omeprazole (PRILOSEC) 20 MG capsule TAKE 1 CAPSULE BY MOUTH TWICE DAILY BEFORE MEAL(S)  . ONETOUCH DELICA LANCETS 88F MISC Use to help check blood sugars daily. E11.9  . PARoxetine (PAXIL) 20 MG tablet Take 1 tablet (20 mg total) by mouth daily.  . predniSONE (DELTASONE) 10 MG tablet 3 tabs by mouth per day for 3 days,2tabs per day for 3 days,1tab per day for 3 days  . promethazine (PHENERGAN) 25 MG tablet TAKE ONE TABLET BY MOUTH EVERY 6 HOURS AS NEEDED FOR NAUSEA  . traMADol (ULTRAM) 50 MG tablet TAKE 1 TABLET BY MOUTH EVERY 6 HOURS AS NEEDED  . ciprofloxacin (CIPRO) 500 MG tablet Take 1 tablet (500 mg total) by mouth 2 (two) times daily. Take twice daily for 7 days.  Marland Kitchen doxycycline (VIBRA-TABS) 100 MG tablet Take 1 tablet (100 mg total) by mouth 2 (two) times daily.   No facility-administered encounter medications on file as of 01/24/2020.    Activities of Daily Living In your present state of health, do you have any difficulty performing the following activities: 01/24/2020  Hearing? N  Vision? N  Difficulty concentrating or making decisions? N  Walking or climbing stairs? N  Dressing or bathing? N  Doing errands, shopping? N  Preparing Food and eating ? N  Using the Toilet? N  In the past six months, have you accidently leaked  urine? N  Do you have problems with loss of bowel control? N  Managing your Medications? N  Managing your Finances? N  Housekeeping or managing your Housekeeping? N  Some recent data might be hidden    Patient Care Team: Biagio Borg, MD as PCP - Gwenevere Abbot, Docia Chuck, MD as Consulting Physician (Gastroenterology) Avon Gully, NP as Nurse Practitioner (Obstetrics and Gynecology)    Assessment:   This is a routine wellness examination for Tovey.  Exercise Activities and Dietary recommendations Current Exercise Habits: The patient does not participate in regular exercise at present(yard work and gardening), Exercise limited by: None identified  Goals    . Prevent Falls     Hx 3 falls in the last year;  (one due to bee stings; the other on concrete) One she slipped when wet and missed step; and tripped on rug in home Will start exercise at silver sneakers; and walking; go slow when raining;  Recommend strength training and balance / discussed exercises and agrees to attempt [employer] x 2 and walking at least 2 times a week         Fall Risk Fall Risk  01/24/2020 04/12/2019 08/16/2018 08/03/2017 01/31/2016  Falls in the past year? 0 0 0 No No  Number falls in past yr: 0 - - - -  Comment - - - - -  Injury with Fall? 0 - - - -  Risk for fall due to : History of fall(s) - - - -  Follow up Falls evaluation completed - - - -  Comment - - - - -   Is the patient's home free of loose throw rugs in walkways, pet beds, electrical cords, etc?   yes      Grab bars in the bathroom? yes      Handrails on  the stairs?   yes      Adequate lighting?   yes  Timed Get Up and Go performed: not indicated  Depression Screen PHQ 2/9 Scores 01/24/2020 04/12/2019 08/16/2018 08/03/2017  PHQ - 2 Score 0 0 0 0  PHQ- 9 Score - - - 0     Cognitive Function MMSE - Mini Mental State Exam 01/16/2015  Not completed: Unable to complete        Immunization History  Administered Date(s) Administered  .  Fluad Quad(high Dose 65+) 05/24/2019  . Influenza Split 06/29/2012  . Influenza Whole 07/18/2003, 06/22/2008  . Influenza, High Dose Seasonal PF 06/15/2013, 06/25/2015, 06/09/2016, 06/17/2017, 06/01/2018  . Influenza,inj,Quad PF,6+ Mos 06/15/2014  . PFIZER SARS-COV-2 Vaccination 10/13/2019, 11/05/2019  . Pneumococcal Conjugate-13 06/29/2013  . Pneumococcal Polysaccharide-23 08/17/2005, 10/24/2010, 08/04/2016  . Td 10/24/2010    Qualifies for Shingles Vaccine? Will check with local pharmacy  Screening Tests Health Maintenance  Topic Date Due  . OPHTHALMOLOGY EXAM  03/01/2019  . FOOT EXAM  08/17/2019  . HEMOGLOBIN A1C  10/13/2019  . COLONOSCOPY  03/02/2020  . INFLUENZA VACCINE  03/31/2020  . TETANUS/TDAP  10/24/2020  . MAMMOGRAM  10/30/2021  . DEXA SCAN  Completed  . COVID-19 Vaccine  Completed  . Hepatitis C Screening  Completed  . PNA vac Low Risk Adult  Completed    Cancer Screenings: Lung: Low Dose CT Chest recommended if Age 54-80 years, 30 pack-year currently smoking OR have quit w/in 15years. Patient does not qualify. Breast:  Up to date on Mammogram? Yes   Up to date of Bone Density/Dexa? Yes Colorectal: Yes  Additional Screenings:  Hepatitis C Screening: completed on 01/31/2016     Plan:     Reviewed health maintenance screenings with patient today and relevant education, vaccines, and/or referrals were provided.    Continue doing brain stimulating activities (puzzles, reading, adult coloring books, staying active) to keep memory sharp.    Continue to eat heart healthy diet (full of fruits, vegetables, whole grains, lean protein, water--limit salt, fat, and sugar intake) and increase physical activity as tolerated.  I have personally reviewed and noted the following in the patient's chart:   . Medical and social history . Use of alcohol, tobacco or illicit drugs  . Current medications and supplements . Functional ability and status . Nutritional  status . Physical activity . Advanced directives . List of other physicians . Hospitalizations, surgeries, and ER visits in previous 12 months . Vitals . Screenings to include cognitive, depression, and falls . Referrals and appointments  In addition, I have reviewed and discussed with patient certain preventive protocols, quality metrics, and best practice recommendations. A written personalized care plan for preventive services as well as general preventive health recommendations were provided to patient.     Sheral Flow, LPN  [id number]  Nurse Health Advisor  Nurse Notes: There were no vitals filed for this visit. There is no height or weight on file to calculate BMI.

## 2020-02-08 DIAGNOSIS — E1165 Type 2 diabetes mellitus with hyperglycemia: Secondary | ICD-10-CM | POA: Diagnosis not present

## 2020-02-08 DIAGNOSIS — M5489 Other dorsalgia: Secondary | ICD-10-CM | POA: Diagnosis not present

## 2020-02-08 DIAGNOSIS — R52 Pain, unspecified: Secondary | ICD-10-CM | POA: Diagnosis not present

## 2020-02-08 DIAGNOSIS — R1084 Generalized abdominal pain: Secondary | ICD-10-CM | POA: Diagnosis not present

## 2020-02-08 DIAGNOSIS — Z743 Need for continuous supervision: Secondary | ICD-10-CM | POA: Diagnosis not present

## 2020-02-14 ENCOUNTER — Encounter: Payer: Self-pay | Admitting: Gastroenterology

## 2020-02-16 ENCOUNTER — Ambulatory Visit (INDEPENDENT_AMBULATORY_CARE_PROVIDER_SITE_OTHER): Payer: Medicare Other | Admitting: Internal Medicine

## 2020-02-16 ENCOUNTER — Other Ambulatory Visit: Payer: Self-pay | Admitting: Internal Medicine

## 2020-02-16 ENCOUNTER — Other Ambulatory Visit: Payer: Self-pay

## 2020-02-16 ENCOUNTER — Ambulatory Visit (INDEPENDENT_AMBULATORY_CARE_PROVIDER_SITE_OTHER): Payer: Medicare Other

## 2020-02-16 ENCOUNTER — Encounter: Payer: Self-pay | Admitting: Internal Medicine

## 2020-02-16 VITALS — BP 118/78 | HR 86 | Temp 98.0°F | Ht 59.0 in | Wt 168.0 lb

## 2020-02-16 DIAGNOSIS — Z0001 Encounter for general adult medical examination with abnormal findings: Secondary | ICD-10-CM | POA: Diagnosis not present

## 2020-02-16 DIAGNOSIS — E538 Deficiency of other specified B group vitamins: Secondary | ICD-10-CM | POA: Diagnosis not present

## 2020-02-16 DIAGNOSIS — M545 Low back pain, unspecified: Secondary | ICD-10-CM

## 2020-02-16 DIAGNOSIS — S8991XA Unspecified injury of right lower leg, initial encounter: Secondary | ICD-10-CM | POA: Diagnosis not present

## 2020-02-16 DIAGNOSIS — E119 Type 2 diabetes mellitus without complications: Secondary | ICD-10-CM

## 2020-02-16 DIAGNOSIS — J309 Allergic rhinitis, unspecified: Secondary | ICD-10-CM

## 2020-02-16 DIAGNOSIS — I10 Essential (primary) hypertension: Secondary | ICD-10-CM

## 2020-02-16 DIAGNOSIS — M25561 Pain in right knee: Secondary | ICD-10-CM

## 2020-02-16 DIAGNOSIS — S3992XA Unspecified injury of lower back, initial encounter: Secondary | ICD-10-CM | POA: Diagnosis not present

## 2020-02-16 DIAGNOSIS — E559 Vitamin D deficiency, unspecified: Secondary | ICD-10-CM | POA: Diagnosis not present

## 2020-02-16 DIAGNOSIS — F329 Major depressive disorder, single episode, unspecified: Secondary | ICD-10-CM

## 2020-02-16 DIAGNOSIS — F32A Depression, unspecified: Secondary | ICD-10-CM

## 2020-02-16 LAB — CBC WITH DIFFERENTIAL/PLATELET
Basophils Absolute: 0 10*3/uL (ref 0.0–0.1)
Basophils Relative: 0.8 % (ref 0.0–3.0)
Eosinophils Absolute: 0.1 10*3/uL (ref 0.0–0.7)
Eosinophils Relative: 1.4 % (ref 0.0–5.0)
HCT: 31.5 % — ABNORMAL LOW (ref 36.0–46.0)
Hemoglobin: 10.2 g/dL — ABNORMAL LOW (ref 12.0–15.0)
Lymphocytes Relative: 49.1 % — ABNORMAL HIGH (ref 12.0–46.0)
Lymphs Abs: 2.4 10*3/uL (ref 0.7–4.0)
MCHC: 32.3 g/dL (ref 30.0–36.0)
MCV: 88.3 fl (ref 78.0–100.0)
Monocytes Absolute: 0.5 10*3/uL (ref 0.1–1.0)
Monocytes Relative: 9.5 % (ref 3.0–12.0)
Neutro Abs: 1.9 10*3/uL (ref 1.4–7.7)
Neutrophils Relative %: 39.2 % — ABNORMAL LOW (ref 43.0–77.0)
Platelets: 277 10*3/uL (ref 150.0–400.0)
RBC: 3.57 Mil/uL — ABNORMAL LOW (ref 3.87–5.11)
RDW: 14.8 % (ref 11.5–15.5)
WBC: 5 10*3/uL (ref 4.0–10.5)

## 2020-02-16 LAB — URINALYSIS, ROUTINE W REFLEX MICROSCOPIC
Bilirubin Urine: NEGATIVE
Hgb urine dipstick: NEGATIVE
Ketones, ur: NEGATIVE
Leukocytes,Ua: NEGATIVE
Nitrite: NEGATIVE
RBC / HPF: NONE SEEN (ref 0–?)
Specific Gravity, Urine: 1.025 (ref 1.000–1.030)
Urine Glucose: 100 — AB
Urobilinogen, UA: 0.2 (ref 0.0–1.0)
pH: 5.5 (ref 5.0–8.0)

## 2020-02-16 LAB — HEPATIC FUNCTION PANEL
ALT: 23 U/L (ref 0–35)
AST: 22 U/L (ref 0–37)
Albumin: 4.3 g/dL (ref 3.5–5.2)
Alkaline Phosphatase: 57 U/L (ref 39–117)
Bilirubin, Direct: 0.1 mg/dL (ref 0.0–0.3)
Total Bilirubin: 0.5 mg/dL (ref 0.2–1.2)
Total Protein: 6.9 g/dL (ref 6.0–8.3)

## 2020-02-16 LAB — MICROALBUMIN / CREATININE URINE RATIO
Creatinine,U: 153.1 mg/dL
Microalb Creat Ratio: 2.2 mg/g (ref 0.0–30.0)
Microalb, Ur: 3.4 mg/dL — ABNORMAL HIGH (ref 0.0–1.9)

## 2020-02-16 LAB — LIPID PANEL
Cholesterol: 155 mg/dL (ref 0–200)
HDL: 51.3 mg/dL (ref >39.00)
NonHDL: 103.42
Total CHOL/HDL Ratio: 3
Triglycerides: 204 mg/dL — ABNORMAL HIGH (ref 0.0–149.0)
VLDL: 40.8 mg/dL — ABNORMAL HIGH (ref 0.0–40.0)

## 2020-02-16 LAB — VITAMIN B12: Vitamin B-12: 372 pg/mL (ref 211–911)

## 2020-02-16 LAB — BASIC METABOLIC PANEL
BUN: 18 mg/dL (ref 6–23)
CO2: 28 mEq/L (ref 19–32)
Calcium: 9.4 mg/dL (ref 8.4–10.5)
Chloride: 104 mEq/L (ref 96–112)
Creatinine, Ser: 1.52 mg/dL — ABNORMAL HIGH (ref 0.40–1.20)
GFR: 40.48 mL/min — ABNORMAL LOW (ref >60.00)
Glucose, Bld: 95 mg/dL (ref 70–99)
Potassium: 4.1 mEq/L (ref 3.5–5.1)
Sodium: 137 mEq/L (ref 135–145)

## 2020-02-16 LAB — LDL CHOLESTEROL, DIRECT: Direct LDL: 73 mg/dL

## 2020-02-16 LAB — TSH: TSH: 2.25 u[IU]/mL (ref 0.35–4.50)

## 2020-02-16 LAB — HEMOGLOBIN A1C: Hgb A1c MFr Bld: 7.7 % — ABNORMAL HIGH (ref 4.6–6.5)

## 2020-02-16 LAB — VITAMIN D 25 HYDROXY (VIT D DEFICIENCY, FRACTURES): VITD: 33.38 ng/mL (ref 30.00–100.00)

## 2020-02-16 MED ORDER — FREESTYLE LIBRE SENSOR SYSTEM MISC: 3 refills | Status: DC

## 2020-02-16 MED ORDER — FEXOFENADINE HCL 180 MG PO TABS
180.0000 mg | ORAL_TABLET | Freq: Every day | ORAL | 2 refills | Status: DC
Start: 2020-02-16 — End: 2021-06-16

## 2020-02-16 MED ORDER — FREESTYLE LIBRE READER DEVI: 0 refills | Status: DC

## 2020-02-16 MED ORDER — CITALOPRAM HYDROBROMIDE 10 MG PO TABS: 10.0000 mg | ORAL_TABLET | Freq: Every day | ORAL | 3 refills | Status: DC

## 2020-02-16 MED ORDER — TRIAMCINOLONE ACETONIDE 55 MCG/ACT NA AERO: 2.0000 | INHALATION_SPRAY | Freq: Every day | NASAL | 12 refills | Status: AC

## 2020-02-16 MED ORDER — MUCINEX 600 MG PO TB12
1200.0000 mg | ORAL_TABLET | Freq: Two times a day (BID) | ORAL | 1 refills | Status: DC | PRN
Start: 2020-02-16 — End: 2021-03-11

## 2020-02-16 MED ORDER — METFORMIN HCL 1000 MG PO TABS: 1000.0000 mg | ORAL_TABLET | Freq: Two times a day (BID) | ORAL | 3 refills | Status: DC

## 2020-02-16 MED ORDER — TRAMADOL HCL 50 MG PO TABS: 50.0000 mg | ORAL_TABLET | Freq: Four times a day (QID) | ORAL | 1 refills | Status: DC | PRN

## 2020-02-16 NOTE — Patient Instructions (Signed)
Your glucometer rx was sent to the pharmacy  Please take all new medication as prescribed - the celexa 10 mg per day for depression, tramadol for pain, and the allegra, nasacort and mucinex for allergies and ears  Please continue all other medications as before, and refills have been done if requested.  Please have the pharmacy call with any other refills you may need.  Please continue your efforts at being more active, low cholesterol diet, and weight control.  You are otherwise up to date with prevention measures today.  Please keep your appointments with your specialists as you may have planned  You will be contacted regarding the referral for: Sports medicine  Please go to the XRAY Department in the first floor for the x-ray testing  Please go to the LAB at the blood drawing area for the tests to be done  You will be contacted by phone if any changes need to be made immediately.  Otherwise, you will receive a letter about your results with an explanation, but please check with MyChart first.  Please remember to sign up for MyChart if you have not done so, as this will be important to you in the future with finding out test results, communicating by private email, and scheduling acute appointments online when needed.  Please make an Appointment to return in 6 months, or sooner if needed

## 2020-02-16 NOTE — Progress Notes (Signed)
Subjective:    Patient ID: Cindy Larson, female    DOB: 01-28-1946, 74 y.o.   MRN: [id number]  HPI  Here for wellness and f/u;  Overall doing ok;  Pt denies Chest pain, worsening SOB, DOE, wheezing, orthopnea, PND, worsening LE edema, palpitations, dizziness or syncope.  Pt denies neurological change such as new headache, facial or extremity weakness.  Pt denies polydipsia, polyuria, or low sugar symptoms. Pt states overall good compliance with treatment and medications, good tolerability, and has been trying to follow appropriate diet.  Pt also has mild worsening depressive symptoms without suicidal ideation or panic. No fever, night sweats, wt loss, loss of appetite, or other constitutional symptoms.  Pt states good ability with ADL's, has low to mod fall risk, home safety reviewed and adequate, no other significant changes in hearing or vision Wt Readings from Last 3 Encounters:  02/16/20 168 lb (76.2 kg)  04/12/19 166 lb (75.3 kg)  08/16/18 165 lb (74.8 kg)  Fell x 2 after episodes of bending forward and lost balance; first time mid may, then second was first wk of June, since then feels like fallng forward with walking, has to consiously stand up straight; bumped head the second time minor to her; the first time right knee went out with getting OOB and fell to right side with bruising and hematoma to psot right arm, now improved.  Has a cane at home, but not feeling like she needs to use it. Now does some knee exercised with bending before trying to get up.  Saw Dr French Ana about 2 yrs ago with gel shot to the right knee, did not go back after first shot. Also, Does have several wks ongoing nasal allergy symptoms with clearish congestion, itch and sneezing, without fever, pain, ST, cough, swelling or wheezing,   Past Medical History:  Diagnosis Date  . ALLERGIC RHINITIS   . ANXIETY   . COLONIC POLYPS, HX OF   . DEPRESSION   . DIABETES MELLITUS, TYPE II   . Diverticulitis   .  DIVERTICULOSIS, COLON   . Esophageal stricture   . Gallstones   . GERD   . HIATAL HERNIA   . History of blood transfusion    left knee replacement  . HYPERLIPIDEMIA   . Hypersomnolence   . HYPERTENSION   . INTERMITTENT VERTIGO   . OBESITY   . OSTEOARTHRITIS    L knee  . OSTEOPENIA   . Pneumonia   . Post-menopausal    hormone replacement therapy   Past Surgical History:  Procedure Laterality Date  . ABDOMINAL HYSTERECTOMY    . CESAREAN SECTION    . CHOLECYSTECTOMY    . Lysis of Adhesions    . OOPHORECTOMY     one  . ROTATOR CUFF REPAIR Left   . TOTAL KNEE ARTHROPLASTY Left     reports that she has never smoked. She has never used smokeless tobacco. She reports that she does not drink alcohol and does not use drugs. family history includes Atrial fibrillation in her mother; Breast cancer in her maternal grandmother and paternal aunt; Cancer in her mother; Colon cancer in her paternal aunt; Diabetes in an other family member; Heart disease in her mother; Heart failure in her brother; Hypertension in her mother; Kidney disease in her maternal aunt; Lung cancer in her brother; Ovarian cancer in her sister. Allergies  Allergen Reactions  . Aspirin Other (See Comments)    Stomach hurts  . Penicillins Hives  blisters  . Prevnar [Pneumococcal 13-Val Conj Vacc] Swelling    Left arm swelling  . Zocor [Simvastatin]     REACTION: itch   Current Outpatient Medications on File Prior to Visit  Medication Sig Dispense Refill  . ALPRAZolam (XANAX) 0.25 MG tablet Take 1 tablet by mouth twice daily as needed for anxiety 60 tablet 5  . aspirin EC 81 MG tablet Take 1 tablet (81 mg total) by mouth daily. 90 tablet 11  . atorvastatin (LIPITOR) 20 MG tablet Take 1 tablet (20 mg total) by mouth daily. 90 tablet 3  . Cholecalciferol (VITAMIN D) 2000 UNITS CAPS Take 2,000 Units by mouth daily.     . cyclobenzaprine (FLEXERIL) 5 MG tablet TAKE ONE TABLET BY MOUTH THREE TIMES DAILY AS NEEDED FOR  MUSCLE SPASM 60 tablet 1  . gabapentin (NEURONTIN) 300 MG capsule Take 1 capsule (300 mg total) by mouth 3 (three) times daily. Start with 1 capsule at bedtime then advanced to one capsule twice a day and if need be increased to 1 capsule 3 times a day 270 capsule 1  . glimepiride (AMARYL) 4 MG tablet Take 1 tablet by mouth twice daily 180 tablet 1  . lisinopril (ZESTRIL) 5 MG tablet Take 1 tablet (5 mg total) by mouth daily. 90 tablet 3  . meclizine (ANTIVERT) 25 MG tablet TAKE ONE TABLET BY MOUTH EVERY 6 HOURS AS NEEDED FOR DIZZINESS 30 tablet 2  . omeprazole (PRILOSEC) 20 MG capsule TAKE 1 CAPSULE BY MOUTH TWICE DAILY BEFORE MEAL(S) 180 capsule 3  . PARoxetine (PAXIL) 20 MG tablet Take 1 tablet (20 mg total) by mouth daily. 90 tablet 3  . promethazine (PHENERGAN) 25 MG tablet TAKE ONE TABLET BY MOUTH EVERY 6 HOURS AS NEEDED FOR NAUSEA 40 tablet 0   No current facility-administered medications on file prior to visit.   Review of Systems All otherwise neg per pt     Objective:   Physical Exam BP 118/78 (BP Location: Left Arm, Patient Position: Sitting, Cuff Size: Large)   Pulse 86   Temp 98 F (36.7 C) (Oral)   Ht 4\' 11"  (1.499 m)   Wt 168 lb (76.2 kg)   SpO2 99%   BMI 33.93 kg/m  VS noted,  Constitutional: Pt appears in NAD HENT: Head: NCAT.  Right Ear: External ear normal.  Left Ear: External ear normal.  Eyes: . Pupils are equal, round, and reactive to light. Conjunctivae and EOM are normal Nose: without d/c or deformity Neck: Neck supple. Gross normal ROM Bilat tm's with mild erythema.  Max sinus areas non tender.  Pharynx with mild erythema, no exudate Cardiovascular: Normal rate and regular rhythm.   Pulmonary/Chest: Effort normal and breath sounds without rales or wheezing.  Abd:  Soft, NT, ND, + BS, no organomegaly Neurological: Pt is alert. At baseline orientation, motor grossly intact Skin: Skin is warm. No rashes, other new lesions, no LE edema Psychiatric: Pt  behavior is normal without agitation , depressed affect Right knee with bony degenerative changes and small effusion All otherwise neg per pt Lab Results  Component Value Date   WBC 5.0 02/16/2020   HGB 10.2 (L) 02/16/2020   HCT 31.5 (L) 02/16/2020   PLT 277.0 02/16/2020   GLUCOSE 95 02/16/2020   CHOL 155 02/16/2020   TRIG 204.0 (H) 02/16/2020   HDL 51.30 02/16/2020   LDLDIRECT 73.0 02/16/2020   LDLCALC 79 04/12/2019   ALT 23 02/16/2020   AST 22 02/16/2020   NA 137  02/16/2020   K 4.1 02/16/2020   CL 104 02/16/2020   CREATININE 1.52 (H) 02/16/2020   BUN 18 02/16/2020   CO2 28 02/16/2020   TSH 2.25 02/16/2020   HGBA1C 7.7 (H) 02/16/2020   MICROALBUR 3.4 (H) 02/16/2020      Assessment & Plan:

## 2020-02-17 ENCOUNTER — Encounter: Payer: Self-pay | Admitting: Internal Medicine

## 2020-02-17 DIAGNOSIS — M25561 Pain in right knee: Secondary | ICD-10-CM | POA: Insufficient documentation

## 2020-02-17 NOTE — Assessment & Plan Note (Signed)

## 2020-02-17 NOTE — Assessment & Plan Note (Addendum)
C/w likely strain, for film, tramadol prn

## 2020-02-17 NOTE — Assessment & Plan Note (Signed)
stable overall by history and exam, recent data reviewed with pt, and pt to continue medical treatment as before,  to f/u any worsening symptoms or concerns, for libre glucometer per pt

## 2020-02-17 NOTE — Assessment & Plan Note (Signed)
Mild to mod , no si or HI, for celexa 10 qd

## 2020-02-17 NOTE — Assessment & Plan Note (Addendum)
C/w djd flare, for sport med referral, and film  I spent 41 minutes in addition to time for CPX wellness examination in preparing to see the patient by review of recent labs, imaging and procedures, obtaining and reviewing separately obtained history, communicating with the patient and family or caregiver, ordering medications, tests or procedures, and documenting clinical information in the EHR including the differential Dx, treatment, and any further evaluation and other management of right knee pain, left low back pain, dm, depression, htn, allergies

## 2020-02-17 NOTE — Assessment & Plan Note (Signed)
Mild to mod, for allegra, nasacort and mucinex bid prn,,  to f/u any worsening symptoms or concerns

## 2020-02-17 NOTE — Assessment & Plan Note (Signed)
stable overall by history and exam, recent data reviewed with pt, and pt to continue medical treatment as before,  to f/u any worsening symptoms or concerns  

## 2020-02-21 ENCOUNTER — Ambulatory Visit: Payer: Medicare Other | Admitting: Family Medicine

## 2020-02-21 ENCOUNTER — Encounter: Payer: Self-pay | Admitting: Family Medicine

## 2020-02-21 ENCOUNTER — Other Ambulatory Visit: Payer: Self-pay

## 2020-02-21 VITALS — BP 110/76 | HR 98 | Ht 59.0 in | Wt 179.8 lb

## 2020-02-21 DIAGNOSIS — M25561 Pain in right knee: Secondary | ICD-10-CM

## 2020-02-21 DIAGNOSIS — G8929 Other chronic pain: Secondary | ICD-10-CM | POA: Diagnosis not present

## 2020-02-21 DIAGNOSIS — S39012A Strain of muscle, fascia and tendon of lower back, initial encounter: Secondary | ICD-10-CM

## 2020-02-21 NOTE — Progress Notes (Signed)
Subjective:    CC: Low back pain and R knee pain  I, Molly Weber, LAT, ATC, am serving as scribe for Dr. Lynne Leader.  HPI: Pt is a 74 y/o female presenting w/ c/o low back and R knee pain.  Pt has had 2 falls, injuring her R knee during the first fall when getting OOB and her R knee gave out.  R knee: Chronic pain x 4-5 years that has worsened over the last month when her knee gave out and she fell. She locates her pain to her R ant-med knee. -Radiating pain: No -Knee swelling: yes -Knee mechanical symptoms: yes -Aggravating factors: trying to get up from prolonged sitting -Treatments tried: heat; Tylenol.  In the past has had steroid injections which did not last very long at all and trial of hyaluronic acid injections which did not last very long either.  Low back pain: Worsening since she fell.  Locates her pain to midline low back -Radiating pain: No -Aggravating factors: Forward flexion; making her bed -Treatments tried: Tramadol; heat  Diagnostic imaging: L-spine and R knee XR- 02/16/20  Pertinent review of Systems: No fevers or chills  Relevant historical information: Hypertension, diabetes   Objective:    Vitals:   02/21/20 1323  BP: 110/76  Pulse: 98  SpO2: 97%   General: Well Developed, well nourished, and in no acute distress.   MSK:  L-spine normal-appearing. Decreased motion. Nontender midline.  Mildly tender palpation paraspinal musculature. Lower extremity strength is intact.  Right knee: Normal-appearing Normal motion with crepitation. Tender palpation medial joint line. Stable ligamentous exam. Negative McMurray's test.  Lab and Radiology Results DG Lumbar Spine Complete  Result Date: 02/17/2020 CLINICAL DATA:  Pain after fall EXAM: LUMBAR SPINE - COMPLETE 4+ VIEW COMPARISON:  October 21, 2005 FINDINGS: Minimal anterolisthesis of L4 versus L5. No fracture or other malalignment. Lower lumbar facet degenerative changes. IMPRESSION: No  traumatic malalignment or fracture identified. Lower lumbar facet degenerative changes. Electronically Signed   By: Dorise Bullion III M.D   On: 02/17/2020 18:21   DG Knee Complete 4 Views Right  Result Date: 02/17/2020 CLINICAL DATA:  Pain after recent falls. EXAM: RIGHT KNEE - COMPLETE 4+ VIEW COMPARISON:  None. FINDINGS: No fractures or dislocations are identified. No definite effusion. Degenerative changes are seen in the medial greater than lateral compartments. IMPRESSION: Degenerative changes.  No fracture noted. Electronically Signed   By: Dorise Bullion III M.D   On: 02/17/2020 18:22   I, Lynne Leader, personally (independently) visualized and performed the interpretation of the images attached in this note.    Impression and Recommendations:    Assessment and Plan: 74 y.o. female with  Low back pain after fall.  Fortunately x-ray obtained on June 19 did not show fracture.  Pain is more paraspinal muscular due to spasm and dysfunction.  Plan for home exercise program taught in clinic today by me and physical therapy.  Heating pad and TENS unit is also reasonable.  Reasonable to also take Tylenol.  Avoid oral NSAIDs due to CKD  Knee pain: Exacerbation of arthritis.  At this point patient is effectively is failing conservative management and probably would be beneficial to have total knee replacement however she would like to avoid it if possible.  Discussed options including repeat trial steroid injection which she would like to avoid.  Reasonable to use Voltaren gel and quad strengthening exercises.  Recheck back in 6 weeks or sooner if needed.  Neck step would  probably be trial steroid injection.Marland Kitchen  PDMP not reviewed this encounter. Orders Placed This Encounter  Procedures  . Ambulatory referral to Physical Therapy    Referral Priority:   Routine    Referral Type:   Physical Medicine    Referral Reason:   Specialty Services Required    Requested Specialty:   Physical Therapy   No  orders of the defined types were placed in this encounter.   Discussed warning signs or symptoms. Please see discharge instructions. Patient expresses understanding.   The above documentation has been reviewed and is accurate and complete Lynne Leader, M.D.

## 2020-02-21 NOTE — Patient Instructions (Signed)
Thank you for coming in today. Plan for PT.  Use over the counter voltaren gel on the knee up to 4x daily for pain.  Ok to continue heat on the knee and the back.  Try TENS unit on the back.   Recheck with me in about 6 weeks.  Return sooner if needed.  Could do an injection if needed.   TENS UNIT: This is helpful for muscle pain and spasm.   Search and Purchase a TENS 7000 2nd edition at  www.tenspros.com or www.[place].com It should be less than $30.     TENS unit instructions: Do not shower or bathe with the unit on Turn the unit off before removing electrodes or batteries If the electrodes lose stickiness add a drop of water to the electrodes after they are disconnected from the unit and place on plastic sheet. If you continued to have difficulty, call the TENS unit company to purchase more electrodes. Do not apply lotion on the skin area prior to use. Make sure the skin is clean and dry as this will help prolong the life of the electrodes. After use, always check skin for unusual red areas, rash or other skin difficulties. If there are any skin problems, does not apply electrodes to the same area. Never remove the electrodes from the unit by pulling the wires. Do not use the TENS unit or electrodes other than as directed. Do not change electrode placement without consultating your therapist or physician. Keep 2 fingers with between each electrode. Wear time ratio is 2:1, on to off times.    For example on for 30 minutes off for 15 minutes and then on for 30 minutes off for 15 minutes     Lumbosacral Strain Lumbosacral strain is an injury that causes pain in the lower back (lumbosacral spine). This injury usually happens from overstretching the muscles or ligaments along your spine. Ligaments are cord-like tissues that connect bones to other bones. A strain can affect one or more muscles or ligaments. What are the causes? This condition may be caused by: A hard, direct hit  to the back. Overstretching the lower back muscles. This may result from: A fall. Lifting something heavy. Repetitive movements such as bending or crouching. What increases the risk? The following factors may make you more likely to develop this condition: Participating in sports or activities that involve: A sudden twist of the back. Pushing or pulling motions. Being overweight or obese. Having poor strength and flexibility, especially tight hamstrings or weak muscles in the back or abdomen. Having too much of a curve in the lower back. Having a pelvis that is tilted forward. What are the signs or symptoms? The main symptom of this condition is pain in the lower back, at the site of the strain. Pain may also be felt down one or both legs. How is this diagnosed? This condition is diagnosed based on your symptoms, your medical history, and a physical exam. During the physical exam, your health care provider may push on certain areas of your back to find the source of your pain. You may be asked to bend forward, backward, and side to side to check your pain and range of motion. You may also have imaging tests, such as X-rays and an MRI. How is this treated? This condition may be treated by: Applying heat and cold on the affected area. Taking medicines to help relieve pain and relax your muscles. Taking NSAIDs, such as ibuprofen, to help reduce swelling and  discomfort. Doing stretching and strengthening exercises for your lower back. Symptoms usually improve within several weeks of treatment. However, recovery time varies. When your symptoms improve, gradually return to your normal routine as soon as possible to reduce pain, avoid stiffness, and keep muscle strength. Follow these instructions at home: Medicines Take over-the-counter and prescription medicines only as told by your health care provider. Ask your health care provider if the medicine prescribed to you: Requires you to avoid  driving or using heavy machinery. Can cause constipation. You may need to take these actions to prevent or treat constipation: Drink enough fluid to keep your urine pale yellow. Take over-the-counter or prescription medicines. Eat foods that are high in fiber, such as beans, whole grains, and fresh fruits and vegetables. Limit foods that are high in fat and processed sugars, such as fried or sweet foods. Managing pain, stiffness, and swelling     If directed, put ice on the injured area. To do this: Put ice in a plastic bag. Place a towel between your skin and the bag. Leave the ice on for 20 minutes, 2-3 times a day. If directed, apply heat on the affected area as often as told by your health care provider. Use the heat source that your health care provider recommends, such as a moist heat pack or a heating pad. Place a towel between your skin and the heat source. Leave the heat on for 20-30 minutes. Remove the heat if your skin turns bright red. This is especially important if you are unable to feel pain, heat, or cold. You may have a greater risk of getting burned. Activity Rest as told by your health care provider. Do not stay in bed. Staying in bed for more than 1-2 days can delay your recovery. Return to your normal activities as told by your health care provider. Ask your health care provider what activities are safe for you. Avoid activities that take a lot of energy for as long as told by your health care provider. Do exercises as told by your health care provider. This includes stretching and strengthening exercises. General instructions Sit up and stand up straight. Avoid leaning forward when you sit, or hunching over when you stand. Do not use any products that contain nicotine or tobacco, such as cigarettes, e-cigarettes, and chewing tobacco. If you need help quitting, ask your health care provider. Keep all follow-up visits as told by your health care provider. This is  important. How is this prevented?  Use correct form when playing sports and lifting heavy objects. Use good posture when sitting and standing. Maintain a healthy weight. Sleep on a mattress with medium firmness to support your back. Do at least 150 minutes of moderate-intensity exercise each week, such as brisk walking or water aerobics. Try a form of exercise that takes stress off your back, such as swimming or stationary cycling. Maintain physical fitness, including: Strength. Flexibility. Contact a health care provider if: Your back pain does not improve after several weeks of treatment. Your symptoms get worse. Get help right away if: Your back pain is severe. You cannot stand or walk. You have difficulty controlling when you urinate or when you have a bowel movement. You feel nauseous or you vomit. Your feet or legs get very cold, turn pale, or look blue. You have numbness, tingling, weakness, or problems using your arms or legs. You develop any of the following: Shortness of breath. Dizziness. Pain in your legs. Weakness in your buttocks or legs.  Summary Lumbosacral strain is an injury that causes pain in the lower back (lumbosacral spine). This injury usually happens from overstretching the muscles or ligaments along your spine. This condition may be caused by a direct hit to the lower back or by overstretching the lower back muscles. Symptoms usually improve within several weeks of treatment. This information is not intended to replace advice given to you by your health care provider. Make sure you discuss any questions you have with your health care provider. Document Revised: 01/10/2019 Document Reviewed: 01/10/2019 Elsevier Patient Education  [place].

## 2020-03-05 ENCOUNTER — Ambulatory Visit: Payer: Medicare Other | Admitting: Physical Therapy

## 2020-03-05 ENCOUNTER — Encounter: Payer: Self-pay | Admitting: Physical Therapy

## 2020-03-05 ENCOUNTER — Other Ambulatory Visit: Payer: Self-pay

## 2020-03-05 DIAGNOSIS — M6281 Muscle weakness (generalized): Secondary | ICD-10-CM

## 2020-03-05 DIAGNOSIS — M545 Low back pain, unspecified: Secondary | ICD-10-CM

## 2020-03-05 DIAGNOSIS — G8929 Other chronic pain: Secondary | ICD-10-CM

## 2020-03-05 DIAGNOSIS — M25561 Pain in right knee: Secondary | ICD-10-CM | POA: Diagnosis not present

## 2020-03-05 DIAGNOSIS — R600 Localized edema: Secondary | ICD-10-CM | POA: Diagnosis not present

## 2020-03-05 DIAGNOSIS — R2689 Other abnormalities of gait and mobility: Secondary | ICD-10-CM | POA: Diagnosis not present

## 2020-03-05 DIAGNOSIS — R296 Repeated falls: Secondary | ICD-10-CM

## 2020-03-05 NOTE — Therapy (Signed)
[facility] [address] [place], [place], [address] Phone: [phone]   Fax:  [phone]  Physical Therapy Evaluation  Patient Details  Name: Maryann Mccall MRN: [id number] Date of Birth: [birth year] Referring Provider (PT): Gregor Hams, MD   Encounter Date: 03/05/2020   PT End of Session - 03/05/20 1410    Visit Number 1    Number of Visits 12    Date for PT Re-Evaluation 04/30/20    Authorization Type UHC MCR    Progress Note Due on Visit 10    PT Start Time 1300    PT Stop Time 1349    PT Time Calculation (min) 49 min    Activity Tolerance Patient tolerated treatment well    Behavior During Therapy [facility] for tasks assessed/performed           Past Medical History:  Diagnosis Date  . ALLERGIC RHINITIS   . ANXIETY   . COLONIC POLYPS, HX OF   . DEPRESSION   . DIABETES MELLITUS, TYPE II   . Diverticulitis   . DIVERTICULOSIS, COLON   . Esophageal stricture   . Gallstones   . GERD   . HIATAL HERNIA   . History of blood transfusion    left knee replacement  . HYPERLIPIDEMIA   . Hypersomnolence   . HYPERTENSION   . INTERMITTENT VERTIGO   . OBESITY   . OSTEOARTHRITIS    L knee  . OSTEOPENIA   . Pneumonia   . Post-menopausal    hormone replacement therapy    Past Surgical History:  Procedure Laterality Date  . ABDOMINAL HYSTERECTOMY    . CESAREAN SECTION    . CHOLECYSTECTOMY    . Lysis of Adhesions    . OOPHORECTOMY     one  . ROTATOR CUFF REPAIR Left   . TOTAL KNEE ARTHROPLASTY Left     There were no vitals filed for this visit.    Subjective Assessment - 03/05/20 1304    Subjective LBP after fall, lumbosacral strain, chronic pain of Rt knee. Rt knee pain for 5 years but worse after fall and LBP worse after fall. Pt has had 2 falls, injuring her R knee during the first fall when getting OOB and her R knee gave out. Second fall she was bending over to pick up water bottle and she fell.    Pertinent  History PMH: anx, dep, DM,knee OA, vertigo, osteopenia, Lt TKA in 90'3    How long can you stand comfortably? 45 minutes    How long can you walk comfortably? depends    Diagnostic tests Lumbar XR" no acute changes or fx, lower lumbar facet Degenerateive changes". Rt knee XR " no fractures, degenerative changes medial greater than lateral"    Patient Stated Goals get the pain down    Currently in Pain? Yes    Pain Score 5     Pain Location Back    Pain Orientation Lower    Pain Descriptors / Indicators Aching;Constant    Pain Type Chronic pain    Pain Radiating Towards denies    Pain Onset More than a month ago    Pain Frequency Constant    Aggravating Factors  anything    Pain Relieving Factors heat    Multiple Pain Sites Yes    Pain Score 6    Pain Location Knee    Pain Orientation Right    Pain Descriptors / Indicators Sharp;Other (Comment)   giving out pain  Pain Type Chronic pain    Pain Radiating Towards denies    Pain Onset More than a month ago    Pain Frequency Intermittent    Aggravating Factors  walking, being out in the yard, sometimes it grabs her just with doing nothing    Pain Relieving Factors ice              [facility] PT Assessment - 03/05/20 0001      Assessment   Medical Diagnosis acute on chronic LBP after fall, lumbosacral strain, chronic pain of Rt knee. Rt knee pain for 5 years but worse after fall and LBP worse after fall    Referring Provider (PT) Gregor Hams, MD    Hand Dominance Right    Next MD Visit 6 weeks    Prior Therapy none      Precautions   Precautions None      Restrictions   Weight Bearing Restrictions No      Balance Screen   Has the patient fallen in the past 6 months Yes    How many times? 2    Has the patient had a decrease in activity level because of a fear of falling?  Yes    Is the patient reluctant to leave their home because of a fear of falling?  No      Home Ec[employer] residence        Prior Function   Level of Independence Independent    Vocation Retired    Leisure work out in yard, Ship broker   Overall Cognitive Status Within Functional Limits for tasks assessed      Observation/Other Assessments   Observations mild edema in her Rt knee      Sensation   Light Touch Appears Intact      Coordination   Gross Motor Movements are Fluid and Coordinated Yes      ROM / Strength   AROM / PROM / Strength AROM;Strength      AROM   AROM Assessment Site Knee;Lumbar    Right/Left Knee Right    Right Knee Extension 5    Right Knee Flexion 120    Lumbar Flexion 90%   pain in center at end range   Lumbar Extension 75%   no pain   Lumbar - Right Side Bend 50%   most pain   Lumbar - Left Side Bend 50%   mod pain   Lumbar - Right Rotation 50%    Lumbar - Left Rotation 50%      Strength   Overall Strength Comments tested in sitting    Strength Assessment Site Knee;Hip    Right/Left Hip Right;Left    Right Hip Flexion 4/5    Right Hip ABduction 4/5    Left Hip Flexion 4+/5    Left Hip ABduction 4+/5    Right/Left Knee Right;Left    Right Knee Flexion 4/5    Right Knee Extension 4/5    Left Knee Flexion 5/5    Left Knee Extension 5/5      Special Tests   Other special tests + SLR for back pain on Rt leg but no radicular symptoms, neg SLR on left side. , + quadrant test for facet arthropathy on Lt that was neg on Rt      Transfers   Transfers Independent with all Transfers   increased time needed due to pain     Standardized Balance  Assessment   Standardized Balance Assessment Berg Balance Test      Berg Balance Test   Sit to Stand Able to stand  independently using hands    Standing Unsupported Able to stand 2 minutes with supervision    Sitting with Back Unsupported but Feet Supported on Floor or Stool Able to sit safely and securely 2 minutes    Stand to Sit Controls descent by using hands    Transfers Able to transfer safely, minor use of hands     Standing Unsupported with Eyes Closed Able to stand 10 seconds with supervision    Standing Unsupported with Feet Together Able to place feet together independently and stand 1 minute safely    From Standing, Reach Forward with Outstretched Arm Can reach forward >12 cm safely (5")    From Standing Position, Pick up Object from Floor Able to pick up shoe, needs supervision    From Standing Position, Turn to Look Behind Over each Shoulder Looks behind from both sides and weight shifts well    Turn 360 Degrees Able to turn 360 degrees safely but slowly    Standing Unsupported, Alternately Place Feet on Step/Stool Able to complete 4 steps without aid or supervision    Standing Unsupported, One Foot in Front Able to take small step independently and hold 30 seconds    Standing on One Leg Tries to lift leg/unable to hold 3 seconds but remains standing independently    Total Score 41                      Objective measurements completed on examination: See above findings.       [facility] PT Treatment/Exercise - 03/05/20 0001      Ambulation/Gait   Gait Comments does not use AD and not intrested in one at this time, she has to flex and extend her knee a few times before she can begin walking due to knee pain, mild antalgic gait with wider BOS and slower overall velocity      Modalities   Modalities Electrical Stimulation;Moist Heat;Vasopneumatic      Moist Heat Therapy   Number Minutes Moist Heat 10 Minutes    Moist Heat Location Lumbar Spine      Electrical Stimulation   Electrical Stimulation Location lumbar and Rt knee    Electrical Stimulation Action pre mod to both    Electrical Stimulation Parameters tolerance    Electrical Stimulation Goals Pain      Vasopneumatic   Number Minutes Vasopneumatic  10 minutes    Vasopnuematic Location  Knee    Vasopneumatic Pressure Low    Vasopneumatic Temperature  34                  PT Education - 03/05/20 1410     Education Details HEP, POC, exam findings    Person(s) Educated Patient    Methods Explanation;Demonstration;Verbal cues;Handout    Comprehension Verbalized understanding;Need further instruction               PT Long Term Goals - 03/05/20 1419      PT LONG TERM GOAL #1   Title Pt will be I and compliant with HEP, Target for all goals 6 weeks 04/30/20    Time 6    Period Weeks    Status New      PT LONG TERM GOAL #2   Title Pt will improve lumbar ROM to [facility] at least 75% ROM available  Status New      PT LONG TERM GOAL #3   Title Pt will reduce overall pain to less than 4/10 with usual activity.    Status New      PT LONG TERM GOAL #4   Title Pt will be able to stand and or ambulate at least 45 minutes for ADL's.    Status New      PT LONG TERM GOAL #5   Title Pt will improve BERG balance score to 46 or better to show improved balance.    Baseline 41    Status New                  Plan - 03/05/20 1412    Clinical Impression Statement Pt presents with acute on chronic LBP after fall, lumbosacral strain, facet arthropahty, chronic pain of Rt knee with knee OA. She has overall decreased balance, difficutly with prolonged standing or walking, decreased lumbar and Rt knee ROM, decreased Rt LE strength and increased pain limiting her funciton. She will benefit from skilled PT to address her deficits and reduce risk of falling.    Personal Factors and Comorbidities Comorbidity 3+    Comorbidities PMH: anx, dep, DM,knee OA, vertigo, osteopenia, Lt TKA    Examination-Activity Limitations Lift;Stand;Stairs;Squat;Locomotion Level;Bend;Transfers    Examination-Participation Restrictions Cleaning;Community Activity;Driving;Yard Work;Laundry;Meal Prep    Stability/Clinical Decision Making Evolving/Moderate complexity    Clinical Decision Making Moderate    Rehab Potential Good    PT Frequency 2x / week   1-2   PT Duration 6 weeks    PT Treatment/Interventions  Cryotherapy;Electrical Stimulation;ADLs/Self Care Home Management;Aquatic Therapy;Iontophoresis 4mg /ml Dexamethasone;Moist Heat;Traction;Ultrasound;Gait training;Therapeutic activities;Therapeutic exercise;Balance training;Neuromuscular re-education;Patient/family education;Manual techniques;Passive range of motion;Dry needling;Joint Manipulations;Spinal Manipulations;Vasopneumatic Device;Taping    PT Next Visit Plan review and update HEP PRN    PT Home Exercise Plan Access Code: [email or web address]    Consulted and Agree with Plan of Care Patient           Patient will benefit from skilled therapeutic intervention in order to improve the following deficits and impairments:  Abnormal gait, Decreased activity tolerance, Decreased balance, Decreased endurance, Decreased range of motion, Decreased strength, Increased edema, Difficulty walking, Increased muscle spasms, Impaired flexibility, Postural dysfunction, Impaired vision/preception  Visit Diagnosis: Chronic bilateral low back pain without sciatica  Chronic pain of right knee  Other abnormalities of gait and mobility  Muscle weakness (generalized)  Localized edema  Repeated falls     Problem List Patient Active Problem List   Diagnosis Date Noted  . Right knee pain 02/17/2020  . Acute sinus infection 01/03/2020  . Left-sided face pain 02/24/2019  . CKD (chronic kidney disease) 06/01/2018  . Diverticulitis of colon without hemorrhage 04/29/2018  . Bilateral shoulder pain 01/31/2016  . Chest pain 04/18/2015  . Left arm pain 07/25/2013  . Neck pain on left side 06/15/2013  . LLQ abdominal pain 10/11/2012  . Back pain 03/03/2012  . Right upper lobe pneumonia 02/16/2012  . Encounter for well adult exam with abnormal findings 05/21/2011  . ABDOMINAL PAIN OTHER SPECIFIED SITE 10/24/2010  . INTERMITTENT VERTIGO 08/07/2009  . Dehydration 01/11/2009  . OSTEOPENIA 04/09/2008  . PARESTHESIA 03/08/2008  . Diabetes ([place]) 07/03/2007  .  Hyperlipidemia 07/03/2007  . OBESITY 07/03/2007  . Anxiety state 07/03/2007  . Depression 07/03/2007  . Essential hypertension 07/03/2007  . Allergic rhinitis 07/03/2007  . GERD 07/03/2007  . HIATAL HERNIA 07/03/2007  . DIVERTICULOSIS, COLON 07/03/2007  . OSTEOARTHRITIS 07/03/2007  . History of  colonic polyps 07/03/2007    Debbe Odea , PT,DPT 03/05/2020, 2:27 PM  [facility] Physical Therapy [address] [place], [place], [address] Phone: [phone]   Fax:  [phone]  Name: Suleika Donavan MRN: [id number] Date of Birth: 01-26-1946

## 2020-03-05 NOTE — Patient Instructions (Signed)
Access Code: [email or web address] URL: [email or web address] Date: 03/05/2020 Prepared by: Elsie Ra  Exercises Supine Lower Trunk Rotation - 2 x daily - 6 x weekly - 10 reps - 1-2 sets - 5 hold Hooklying Single Knee to Chest Stretch - 2 x daily - 6 x weekly - 2 reps - 1 sets - 20 hold Supine Bridge - 2 x daily - 6 x weekly - 10 reps - 1-2 sets - 5 hold Mini Squat with Counter Support - 2 x daily - 6 x weekly - 1-2 sets - 10 reps Side Stepping with Counter Support - 2 x daily - 6 x weekly - 1 sets - 3-5 reps Standing Tandem Balance with Counter Support - 2 x daily - 6 x weekly - 1 sets - 3 reps - 30 hold

## 2020-03-13 ENCOUNTER — Encounter: Payer: Medicare Other | Admitting: Physical Therapy

## 2020-03-21 ENCOUNTER — Encounter: Payer: Medicare Other | Admitting: Physical Therapy

## 2020-03-26 ENCOUNTER — Encounter: Payer: Medicare Other | Admitting: Physical Therapy

## 2020-04-01 ENCOUNTER — Other Ambulatory Visit: Payer: Self-pay | Admitting: Gastroenterology

## 2020-04-02 ENCOUNTER — Other Ambulatory Visit: Payer: Self-pay | Admitting: Gastroenterology

## 2020-04-03 ENCOUNTER — Ambulatory Visit: Payer: Medicare Other | Admitting: Family Medicine

## 2020-04-05 ENCOUNTER — Encounter: Payer: Medicare Other | Admitting: Rehabilitative and Restorative Service Providers"

## 2020-04-10 ENCOUNTER — Encounter: Payer: Medicare Other | Admitting: Rehabilitative and Restorative Service Providers"

## 2020-04-10 ENCOUNTER — Other Ambulatory Visit: Payer: Self-pay | Admitting: Internal Medicine

## 2020-04-10 NOTE — Telephone Encounter (Signed)
Please refill as per office routine med refill policy (all routine meds refilled for 3 mo or monthly per pt preference up to one year from last visit, then month to month grace period for 3 mo, then further med refills will have to be denied)  

## 2020-04-11 NOTE — Telephone Encounter (Signed)
1.Medication Requested:omeprazole (PRILOSEC) 20 MG capsule  2. Pharmacy (Name, Street, [place]):[place] - [address]  3. On Med List: Yes   4. Last Visit with PCP: 6.18.21   5. Next visit date with PCP: n/a   Agent: Please be advised that RX refills may take up to 3 business days. We ask that you follow-up with your pharmacy.

## 2020-04-19 ENCOUNTER — Encounter: Payer: Medicare Other | Admitting: Rehabilitative and Restorative Service Providers"

## 2020-05-20 ENCOUNTER — Other Ambulatory Visit: Payer: Self-pay | Admitting: Internal Medicine

## 2020-06-04 ENCOUNTER — Ambulatory Visit: Payer: Medicare Other | Attending: Internal Medicine

## 2020-06-04 DIAGNOSIS — Z23 Encounter for immunization: Secondary | ICD-10-CM

## 2020-06-04 NOTE — Progress Notes (Signed)
   Covid-19 Vaccination Clinic  Name:  Cindy Larson    MRN: [id number] DOB: Jul 04, 1946  06/04/2020  Cindy Larson was observed post Covid-19 immunization for 15 minutes without incident. She was provided with Vaccine Information Sheet and instruction to access the V-Safe system.   Cindy Larson was instructed to call 911 with any severe reactions post vaccine: Marland Kitchen Difficulty breathing  . Swelling of face and throat  . A fast heartbeat  . A bad rash all over body  . Dizziness and weakness

## 2020-06-15 ENCOUNTER — Ambulatory Visit (INDEPENDENT_AMBULATORY_CARE_PROVIDER_SITE_OTHER): Payer: Medicare Other

## 2020-06-15 DIAGNOSIS — Z23 Encounter for immunization: Secondary | ICD-10-CM

## 2020-06-19 ENCOUNTER — Other Ambulatory Visit: Payer: Self-pay

## 2020-07-02 DIAGNOSIS — H40033 Anatomical narrow angle, bilateral: Secondary | ICD-10-CM | POA: Diagnosis not present

## 2020-07-03 ENCOUNTER — Other Ambulatory Visit: Payer: Self-pay

## 2020-07-03 ENCOUNTER — Encounter: Payer: Self-pay | Admitting: Internal Medicine

## 2020-07-03 ENCOUNTER — Ambulatory Visit (INDEPENDENT_AMBULATORY_CARE_PROVIDER_SITE_OTHER): Payer: Medicare Other | Admitting: Internal Medicine

## 2020-07-03 VITALS — BP 120/80 | HR 95 | Temp 98.4°F | Ht 59.0 in | Wt 170.0 lb

## 2020-07-03 DIAGNOSIS — I10 Essential (primary) hypertension: Secondary | ICD-10-CM

## 2020-07-03 DIAGNOSIS — Z86010 Personal history of colonic polyps: Secondary | ICD-10-CM | POA: Diagnosis not present

## 2020-07-03 DIAGNOSIS — N1831 Chronic kidney disease, stage 3a: Secondary | ICD-10-CM

## 2020-07-03 DIAGNOSIS — E1165 Type 2 diabetes mellitus with hyperglycemia: Secondary | ICD-10-CM

## 2020-07-03 LAB — CBC WITH DIFFERENTIAL/PLATELET
Basophils Absolute: 0 10*3/uL (ref 0.0–0.1)
Basophils Relative: 0.8 % (ref 0.0–3.0)
Eosinophils Absolute: 0.1 10*3/uL (ref 0.0–0.7)
Eosinophils Relative: 2.5 % (ref 0.0–5.0)
HCT: 32.6 % — ABNORMAL LOW (ref 36.0–46.0)
Hemoglobin: 10.5 g/dL — ABNORMAL LOW (ref 12.0–15.0)
Lymphocytes Relative: 46.7 % — ABNORMAL HIGH (ref 12.0–46.0)
Lymphs Abs: 2.3 10*3/uL (ref 0.7–4.0)
MCHC: 32.2 g/dL (ref 30.0–36.0)
MCV: 84.8 fl (ref 78.0–100.0)
Monocytes Absolute: 0.4 10*3/uL (ref 0.1–1.0)
Monocytes Relative: 7.8 % (ref 3.0–12.0)
Neutro Abs: 2.1 10*3/uL (ref 1.4–7.7)
Neutrophils Relative %: 42.2 % — ABNORMAL LOW (ref 43.0–77.0)
Platelets: 298 10*3/uL (ref 150.0–400.0)
RBC: 3.85 Mil/uL — ABNORMAL LOW (ref 3.87–5.11)
RDW: 15.1 % (ref 11.5–15.5)
WBC: 5 10*3/uL (ref 4.0–10.5)

## 2020-07-03 LAB — LIPID PANEL
Cholesterol: 130 mg/dL (ref 0–200)
HDL: 51.8 mg/dL (ref >39.00)
LDL Cholesterol: 51 mg/dL (ref 0–99)
NonHDL: 78.08
Total CHOL/HDL Ratio: 3
Triglycerides: 137 mg/dL (ref 0.0–149.0)
VLDL: 27.4 mg/dL (ref 0.0–40.0)

## 2020-07-03 LAB — HEPATIC FUNCTION PANEL
ALT: 11 U/L (ref 0–35)
AST: 15 U/L (ref 0–37)
Albumin: 4.4 g/dL (ref 3.5–5.2)
Alkaline Phosphatase: 58 U/L (ref 39–117)
Bilirubin, Direct: 0.1 mg/dL (ref 0.0–0.3)
Total Bilirubin: 0.6 mg/dL (ref 0.2–1.2)
Total Protein: 7.1 g/dL (ref 6.0–8.3)

## 2020-07-03 LAB — BASIC METABOLIC PANEL
BUN: 22 mg/dL (ref 6–23)
CO2: 26 mEq/L (ref 19–32)
Calcium: 9.4 mg/dL (ref 8.4–10.5)
Chloride: 103 mEq/L (ref 96–112)
Creatinine, Ser: 1.6 mg/dL — ABNORMAL HIGH (ref 0.40–1.20)
GFR: 31.66 mL/min — ABNORMAL LOW (ref >60.00)
Glucose, Bld: 217 mg/dL — ABNORMAL HIGH (ref 70–99)
Potassium: 4.7 mEq/L (ref 3.5–5.1)
Sodium: 137 mEq/L (ref 135–145)

## 2020-07-03 LAB — VITAMIN D 25 HYDROXY (VIT D DEFICIENCY, FRACTURES): VITD: 47.14 ng/mL (ref 30.00–100.00)

## 2020-07-03 LAB — HEMOGLOBIN A1C: Hgb A1c MFr Bld: 6.9 % — ABNORMAL HIGH (ref 4.6–6.5)

## 2020-07-03 NOTE — Patient Instructions (Signed)
You will be contacted regarding the referral for: colonoscopy  Please continue all other medications as before, and refills have been done if requested.  Please have the pharmacy call with any other refills you may need.  Please continue your efforts at being more active, low cholesterol diet, and weight control..  Please keep your appointments with your specialists as you may have planned  Please go to the LAB at the blood drawing area for the tests to be done  You will be contacted by phone if any changes need to be made immediately.  Otherwise, you will receive a letter about your results with an explanation, but please check with MyChart first.  Please remember to sign up for MyChart if you have not done so, as this will be important to you in the future with finding out test results, communicating by private email, and scheduling acute appointments online when needed.  Please make an Appointment to return in 6 months, or sooner if needed

## 2020-07-03 NOTE — Progress Notes (Signed)
Subjective:    Patient ID: Cindy Larson, female    DOB: [birth year], 74 y.o.   MRN: [id number]  HPI  Here to f/u; overall doing ok,  Pt denies chest pain, increasing sob or doe, wheezing, orthopnea, PND, increased LE swelling, palpitations, dizziness or syncope.  Pt denies new neurological symptoms such as new headache, or facial or extremity weakness or numbness.  Pt denies polydipsia, polyuria, or low sugar episode.  Pt states overall good compliance with meds, mostly trying to follow appropriate diet, with wt overall stable,  but little exercise however. Due for colonoscopy Past Medical History:  Diagnosis Date  . ALLERGIC RHINITIS   . ANXIETY   . COLONIC POLYPS, HX OF   . DEPRESSION   . DIABETES MELLITUS, TYPE II   . Diverticulitis   . DIVERTICULOSIS, COLON   . Esophageal stricture   . Gallstones   . GERD   . HIATAL HERNIA   . History of blood transfusion    left knee replacement  . HYPERLIPIDEMIA   . Hypersomnolence   . HYPERTENSION   . INTERMITTENT VERTIGO   . OBESITY   . OSTEOARTHRITIS    L knee  . OSTEOPENIA   . Pneumonia   . Post-menopausal    hormone replacement therapy   Past Surgical History:  Procedure Laterality Date  . ABDOMINAL HYSTERECTOMY    . CESAREAN SECTION    . CHOLECYSTECTOMY    . Lysis of Adhesions    . OOPHORECTOMY     one  . ROTATOR CUFF REPAIR Left   . TOTAL KNEE ARTHROPLASTY Left     reports that she has never smoked. She has never used smokeless tobacco. She reports that she does not drink alcohol and does not use drugs. family history includes Atrial fibrillation in her mother; Breast cancer in her maternal grandmother and paternal aunt; Cancer in her mother; Colon cancer in her paternal aunt; Diabetes in an other family member; Heart disease in her mother; Heart failure in her brother; Hypertension in her mother; Kidney disease in her maternal aunt; Lung cancer in her brother; Ovarian cancer in her sister. Allergies  Allergen  Reactions  . Aspirin Other (See Comments)    Stomach hurts  . Penicillins Hives    blisters  . Prevnar [Pneumococcal 13-Val Conj Vacc] Swelling    Left arm swelling  . Zocor [Simvastatin]     REACTION: itch   Current Outpatient Medications on File Prior to Visit  Medication Sig Dispense Refill  . ALPRAZolam (XANAX) 0.25 MG tablet Take 1 tablet by mouth twice daily as needed for anxiety 60 tablet 5  . aspirin EC 81 MG tablet Take 1 tablet (81 mg total) by mouth daily. 90 tablet 11  . atorvastatin (LIPITOR) 20 MG tablet Take 1 tablet (20 mg total) by mouth daily. 90 tablet 3  . Cholecalciferol (VITAMIN D) 2000 UNITS CAPS Take 2,000 Units by mouth daily.     . citalopram (CELEXA) 10 MG tablet Take 1 tablet (10 mg total) by mouth daily. 90 tablet 3  . Continuous Blood Gluc Receiver (FREESTYLE LIBRE READER) DEVI Use as directed once daily E11.9 1 each 0  . Continuous Blood Gluc Sensor (FREESTYLE LIBRE SENSOR SYSTEM) MISC Use as directed daily, change every 14 days E11.9 6 each 3  . fexofenadine (ALLEGRA) 180 MG tablet Take 1 tablet (180 mg total) by mouth daily. 30 tablet 2  . glimepiride (AMARYL) 4 MG tablet Take 1 tablet by mouth twice daily 180  tablet 0  . guaiFENesin (MUCINEX) 600 MG 12 hr tablet Take 2 tablets (1,200 mg total) by mouth 2 (two) times daily as needed. 60 tablet 1  . lisinopril (ZESTRIL) 5 MG tablet Take 1 tablet (5 mg total) by mouth daily. 90 tablet 3  . meclizine (ANTIVERT) 25 MG tablet TAKE ONE TABLET BY MOUTH EVERY 6 HOURS AS NEEDED FOR DIZZINESS 30 tablet 2  . metFORMIN (GLUCOPHAGE) 1000 MG tablet Take 1 tablet (1,000 mg total) by mouth 2 (two) times daily with a meal. 180 tablet 3  . PARoxetine (PAXIL) 20 MG tablet Take 1 tablet (20 mg total) by mouth daily. 90 tablet 3  . promethazine (PHENERGAN) 25 MG tablet TAKE ONE TABLET BY MOUTH EVERY 6 HOURS AS NEEDED FOR NAUSEA 40 tablet 0  . traMADol (ULTRAM) 50 MG tablet Take 1 tablet (50 mg total) by mouth every 6 (six)  hours as needed. 60 tablet 1  . triamcinolone (NASACORT) 55 MCG/ACT AERO nasal inhaler Place 2 sprays into the nose daily. 1 Inhaler 12   No current facility-administered medications on file prior to visit.   Review of Systems All otherwise neg per pt    Objective:   Physical Exam BP 120/80 (BP Location: Left Arm, Patient Position: Sitting, Cuff Size: Large)   Pulse 95   Temp 98.4 F (36.9 C) (Oral)   Ht 4\' 11"  (1.499 m)   Wt 170 lb (77.1 kg)   SpO2 97%   BMI 34.34 kg/m  VS noted,  Constitutional: Pt appears in NAD HENT: Head: NCAT.  Right Ear: External ear normal.  Left Ear: External ear normal.  Eyes: . Pupils are equal, round, and reactive to light. Conjunctivae and EOM are normal Nose: without d/c or deformity Neck: Neck supple. Gross normal ROM Cardiovascular: Normal rate and regular rhythm.   Pulmonary/Chest: Effort normal and breath sounds without rales or wheezing.  Abd:  Soft, NT, ND, + BS, no organomegaly Neurological: Pt is alert. At baseline orientation, motor grossly intact Skin: Skin is warm. No rashes, other new lesions, no LE edema Psychiatric: Pt behavior is normal without agitation  All otherwise neg per pt  Lab Results  Component Value Date   WBC 5.0 02/16/2020   HGB 10.2 (L) 02/16/2020   HCT 31.5 (L) 02/16/2020   PLT 277.0 02/16/2020   GLUCOSE 95 02/16/2020   CHOL 155 02/16/2020   TRIG 204.0 (H) 02/16/2020   HDL 51.30 02/16/2020   LDLDIRECT 73.0 02/16/2020   LDLCALC 79 04/12/2019   ALT 23 02/16/2020   AST 22 02/16/2020   NA 137 02/16/2020   K 4.1 02/16/2020   CL 104 02/16/2020   CREATININE 1.52 (H) 02/16/2020   BUN 18 02/16/2020   CO2 28 02/16/2020   TSH 2.25 02/16/2020   HGBA1C 7.7 (H) 02/16/2020   MICROALBUR 3.4 (H) 02/16/2020       Assessment & Plan:

## 2020-07-04 LAB — PTH, INTACT AND CALCIUM
Calcium: 9.6 mg/dL (ref 8.6–10.4)
PTH: 75 pg/mL — ABNORMAL HIGH (ref 14–64)

## 2020-07-08 ENCOUNTER — Other Ambulatory Visit: Payer: Self-pay | Admitting: Internal Medicine

## 2020-07-08 ENCOUNTER — Other Ambulatory Visit: Payer: Self-pay

## 2020-07-08 ENCOUNTER — Encounter: Payer: Self-pay | Admitting: Internal Medicine

## 2020-07-08 NOTE — Assessment & Plan Note (Signed)
stable overall by history and exam, recent data reviewed with pt, and pt to continue medical treatment as before,  to f/u any worsening symptoms or concerns  

## 2020-07-08 NOTE — Assessment & Plan Note (Signed)
For colonoscopy 

## 2020-07-08 NOTE — Assessment & Plan Note (Addendum)
stable overall by history and exam, recent data reviewed with pt, and pt to continue medical treatment as before,  to f/u any worsening symptoms or concerns  I spent 31 minutes in preparing to see the patient by review of recent labs, imaging and procedures, obtaining and reviewing separately obtained history, communicating with the patient and family or caregiver, ordering medications, tests or procedures, and documenting clinical information in the EHR including the differential Dx, treatment, and any further evaluation and other management of dm, htn, ckd, hx of colon polyps

## 2020-07-10 ENCOUNTER — Ambulatory Visit: Payer: Medicare Other | Admitting: Internal Medicine

## 2020-07-18 ENCOUNTER — Other Ambulatory Visit: Payer: Self-pay | Admitting: Internal Medicine

## 2020-07-26 ENCOUNTER — Other Ambulatory Visit: Payer: Self-pay | Admitting: Internal Medicine

## 2020-07-27 NOTE — Telephone Encounter (Signed)
Please refill as per office routine med refill policy (all routine meds refilled for 3 mo or monthly per pt preference up to one year from last visit, then month to month grace period for 3 mo, then further med refills will have to be denied)  

## 2020-08-08 ENCOUNTER — Other Ambulatory Visit: Payer: Self-pay | Admitting: Internal Medicine

## 2020-08-22 ENCOUNTER — Other Ambulatory Visit: Payer: Self-pay | Admitting: Internal Medicine

## 2020-08-22 NOTE — Telephone Encounter (Signed)
Please refill as per office routine med refill policy (all routine meds refilled for 3 mo or monthly per pt preference up to one year from last visit, then month to month grace period for 3 mo, then further med refills will have to be denied)  

## 2020-09-03 DIAGNOSIS — R202 Paresthesia of skin: Secondary | ICD-10-CM | POA: Diagnosis not present

## 2020-09-03 DIAGNOSIS — L720 Epidermal cyst: Secondary | ICD-10-CM | POA: Diagnosis not present

## 2020-09-03 DIAGNOSIS — D1801 Hemangioma of skin and subcutaneous tissue: Secondary | ICD-10-CM | POA: Diagnosis not present

## 2020-09-03 DIAGNOSIS — D225 Melanocytic nevi of trunk: Secondary | ICD-10-CM | POA: Diagnosis not present

## 2020-09-03 DIAGNOSIS — L304 Erythema intertrigo: Secondary | ICD-10-CM | POA: Diagnosis not present

## 2020-09-03 DIAGNOSIS — L811 Chloasma: Secondary | ICD-10-CM | POA: Diagnosis not present

## 2020-09-03 DIAGNOSIS — L821 Other seborrheic keratosis: Secondary | ICD-10-CM | POA: Diagnosis not present

## 2020-09-11 ENCOUNTER — Other Ambulatory Visit: Payer: Self-pay | Admitting: Internal Medicine

## 2020-09-11 NOTE — Telephone Encounter (Signed)
Please refill as per office routine med refill policy (all routine meds refilled for 3 mo or monthly per pt preference up to one year from last visit, then month to month grace period for 3 mo, then further med refills will have to be denied)  

## 2020-09-24 ENCOUNTER — Ambulatory Visit (AMBULATORY_SURGERY_CENTER): Payer: Self-pay | Admitting: *Deleted

## 2020-09-24 ENCOUNTER — Other Ambulatory Visit: Payer: Self-pay

## 2020-09-24 VITALS — Ht 59.0 in | Wt 170.0 lb

## 2020-09-24 DIAGNOSIS — Z86010 Personal history of colonic polyps: Secondary | ICD-10-CM

## 2020-09-24 MED ORDER — NA SULFATE-K SULFATE-MG SULF 17.5-3.13-1.6 GM/177ML PO SOLN: ORAL | 0 refills | Status: DC

## 2020-09-24 NOTE — Progress Notes (Signed)
Patient is here in-person for PV. Patient denies any allergies to eggs or soy. Patient denies any problems with anesthesia/sedation. Patient denies any oxygen use at home. Patient denies taking any diet/weight loss medications or blood thinners. Patient is not being treated for MRSA or C-diff. Patient is aware of our care-partner policy and VEXOG-00 safety protocol.   COVID-19 vaccines completed on 06/04/20 x3, per patient.

## 2020-09-27 ENCOUNTER — Other Ambulatory Visit: Payer: Self-pay | Admitting: Internal Medicine

## 2020-09-27 DIAGNOSIS — Z1231 Encounter for screening mammogram for malignant neoplasm of breast: Secondary | ICD-10-CM

## 2020-09-30 ENCOUNTER — Encounter: Payer: Self-pay | Admitting: Internal Medicine

## 2020-10-08 ENCOUNTER — Ambulatory Visit (AMBULATORY_SURGERY_CENTER): Payer: Medicare Other | Admitting: Internal Medicine

## 2020-10-08 ENCOUNTER — Encounter: Payer: Self-pay | Admitting: Internal Medicine

## 2020-10-08 ENCOUNTER — Encounter: Payer: Medicare Other | Admitting: Internal Medicine

## 2020-10-08 ENCOUNTER — Other Ambulatory Visit: Payer: Self-pay

## 2020-10-08 VITALS — BP 120/58 | HR 84 | Temp 97.2°F | Resp 18 | Ht 59.0 in | Wt 170.0 lb

## 2020-10-08 DIAGNOSIS — D122 Benign neoplasm of ascending colon: Secondary | ICD-10-CM

## 2020-10-08 DIAGNOSIS — Z86010 Personal history of colonic polyps: Secondary | ICD-10-CM | POA: Diagnosis not present

## 2020-10-08 DIAGNOSIS — D120 Benign neoplasm of cecum: Secondary | ICD-10-CM

## 2020-10-08 DIAGNOSIS — K219 Gastro-esophageal reflux disease without esophagitis: Secondary | ICD-10-CM | POA: Diagnosis not present

## 2020-10-08 MED ORDER — SODIUM CHLORIDE 0.9 % IV SOLN: 500.0000 mL | INTRAVENOUS | Status: DC

## 2020-10-08 NOTE — Op Note (Signed)
Cindy Larson Patient Name: Cindy Larson Procedure Date: 10/08/2020 11:19 AM MRN: [id number] Endoscopist: Docia Chuck. [name] , MD Age: 75 Referring MD:  Date of Birth: Nov 30, 1945 Gender: Female Account #: [id number] Procedure:                Colonoscopy with cold snare polypectomy x 3 Indications:              High risk colon cancer surveillance: Personal                            history of adenoma (10 mm or greater in size), High                            risk colon cancer surveillance: Personal history of                            adenoma with high grade dysplasia, High risk colon                            cancer surveillance: Personal history of adenoma                            with villous component, High risk colon cancer                            surveillance: Personal history of multiple (3 or                            more) adenomas. Previous examinations 2000, 2002,                            2005, 2011, 2017 (tattoo), 2018 Medicines:                Monitored Anesthesia Care Procedure:                Pre-Anesthesia Assessment:                           - Prior to the procedure, a History and Physical                            was performed, and patient medications and                            allergies were reviewed. The patient's tolerance of                            previous anesthesia was also reviewed. The risks                            and benefits of the procedure and the sedation                            options and risks were discussed with the patient.  All questions were answered, and informed consent                            was obtained. Prior Anticoagulants: The patient has                            taken no previous anticoagulant or antiplatelet                            agents. ASA Grade Assessment: II - A patient with                            mild systemic disease. After reviewing the risks                             and benefits, the patient was deemed in                            satisfactory condition to undergo the procedure.                           After obtaining informed consent, the colonoscope                            was passed under direct vision. Throughout the                            procedure, the patient's blood pressure, pulse, and                            oxygen saturations were monitored continuously. The                            Olympus PCF-H190DL (#[id number]) Colonoscope was                            introduced through the anus and advanced to the the                            cecum, identified by appendiceal orifice and                            ileocecal valve. The ileocecal valve, appendiceal                            orifice, and rectum were photographed. The quality                            of the bowel preparation was excellent. The                            colonoscopy was performed without difficulty. The  patient tolerated the procedure well. The bowel                            preparation used was SUPREP via split dose                            instruction. Scope In: 11:33:58 AM Scope Out: 11:53:05 AM Scope Withdrawal Time: 0 hours 11 minutes 37 seconds  Total Procedure Duration: 0 hours 19 minutes 7 seconds  Findings:                 Three polyps were found in the ascending colon and                            cecum. The polyps were 1 to 3 mm in size. These                            polyps were removed with a cold snare. Resection                            and retrieval were complete.                           Diverticula were found in the sigmoid colon and                            right colon. RECTOSIGMOID STENOSIS                           The exam was otherwise without abnormality on                            direct and retroflexion views. Complications:            No immediate complications. Estimated  blood loss:                            None. Estimated Blood Loss:     Estimated blood loss: none. Impression:               - Three 1 to 3 mm polyps in the ascending colon and                            in the cecum, removed with a cold snare. Resected                            and retrieved.                           - Diverticulosis in the sigmoid colon and in the                            right colon. There was significant rectosigmoid  stenosis.                           - The examination was otherwise normal on direct                            and retroflexion views. Recommendation:           - Repeat colonoscopy in 5 years for surveillance.                            PEDIATRIC SCOPE                           - Patient has a contact number available for                            emergencies. The signs and symptoms of potential                            delayed complications were discussed with the                            patient. Return to normal activities tomorrow.                            Written discharge instructions were provided to the                            patient.                           - Resume previous diet.                           - Continue present medications.                           - Await pathology results. Docia Chuck. [name], MD 10/08/2020 12:00:22 PM This report has been signed electronically.

## 2020-10-08 NOTE — Progress Notes (Signed)
Report to PACU, RN, vss, BBS= Clear.  

## 2020-10-08 NOTE — Progress Notes (Signed)
Called to room to assist during endoscopic procedure.  Patient ID and intended procedure confirmed with present staff. Received instructions for my participation in the procedure from the performing physician.  

## 2020-10-08 NOTE — Patient Instructions (Signed)
Thank you for allowing [place] to care for you today!  Resume previous diet and medications today.  Resume normal daily activities tomorrow.  Recommend next colonoscopy in 5 years.     YOU HAD AN ENDOSCOPIC PROCEDURE TODAY AT [facility]:   Refer to the procedure report that was given to you for any specific questions about what was found during the examination.  If the procedure report does not answer your questions, please call your gastroenterologist to clarify.  If you requested that your care partner not be given the details of your procedure findings, then the procedure report has been included in a sealed envelope for you to review at your convenience later.  YOU SHOULD EXPECT: Some feelings of bloating in the abdomen. Passage of more gas than usual.  Walking can help get rid of the air that was put into your GI tract during the procedure and reduce the bloating. If you had a lower endoscopy (such as a colonoscopy or flexible sigmoidoscopy) you may notice spotting of blood in your stool or on the toilet paper. If you underwent a bowel prep for your procedure, you may not have a normal bowel movement for a few days.  Please Note:  You might notice some irritation and congestion in your nose or some drainage.  This is from the oxygen used during your procedure.  There is no need for concern and it should clear up in a day or so.  SYMPTOMS TO REPORT IMMEDIATELY:   Following lower endoscopy (colonoscopy or flexible sigmoidoscopy):  Excessive amounts of blood in the stool  Significant tenderness or worsening of abdominal pains  Swelling of the abdomen that is new, acute  Fever of 100F or higher   For urgent or emergent issues, a gastroenterologist can be reached at any hour by calling [phone]. Do not use MyChart messaging for urgent concerns.    DIET:  We do recommend a small meal at first, but then you may proceed to your regular diet.  Drink plenty of fluids but  you should avoid alcoholic beverages for 24 hours.  ACTIVITY:  You should plan to take it easy for the rest of today and you should NOT DRIVE or use heavy machinery until tomorrow (because of the sedation medicines used during the test).    FOLLOW UP: Our staff will call the number listed on your records 48-72 hours following your procedure to check on you and address any questions or concerns that you may have regarding the information given to you following your procedure. If we do not reach you, we will leave a message.  We will attempt to reach you two times.  During this call, we will ask if you have developed any symptoms of COVID 19. If you develop any symptoms (ie: fever, flu-like symptoms, shortness of breath, cough etc.) before then, please call [phone].  If you test positive for Covid 19 in the 2 weeks post procedure, please call and report this information to [place].    If any biopsies were taken you will be contacted by phone or by letter within the next 1-3 weeks.  Please call us at [phone] if you have not heard about the biopsies in 3 weeks.    SIGNATURES/CONFIDENTIALITY: You and/or your care partner have signed paperwork which will be entered into your electronic medical record.  These signatures attest to the fact that that the information above on your After Visit Summary has been reviewed and is understood.  Full responsibility of the confidentiality of this discharge information lies with you and/or your care-partner.YOU HAD AN ENDOSCOPIC PROCEDURE TODAY AT [facility]:   Refer to the procedure report that was given to you for any specific questions about what was found during the examination.  If the procedure report does not answer your questions, please call your gastroenterologist to clarify.  If you requested that your care partner not be given the details of your procedure findings, then the procedure report has been included in a sealed envelope for you  to review at your convenience later.  YOU SHOULD EXPECT: Some feelings of bloating in the abdomen. Passage of more gas than usual.  Walking can help get rid of the air that was put into your GI tract during the procedure and reduce the bloating. If you had a lower endoscopy (such as a colonoscopy or flexible sigmoidoscopy) you may notice spotting of blood in your stool or on the toilet paper. If you underwent a bowel prep for your procedure, you may not have a normal bowel movement for a few days.  Please Note:  You might notice some irritation and congestion in your nose or some drainage.  This is from the oxygen used during your procedure.  There is no need for concern and it should clear up in a day or so.  SYMPTOMS TO REPORT IMMEDIATELY:   Following lower endoscopy (colonoscopy or flexible sigmoidoscopy):  Excessive amounts of blood in the stool  Significant tenderness or worsening of abdominal pains  Swelling of the abdomen that is new, acute  Fever of 100F or higher   Following upper endoscopy (EGD)  Vomiting of blood or coffee ground material  New chest pain or pain under the shoulder blades  Painful or persistently difficult swallowing  New shortness of breath  Fever of 100F or higher  Black, tarry-looking stools  For urgent or emergent issues, a gastroenterologist can be reached at any hour by calling [phone]. Do not use MyChart messaging for urgent concerns.    DIET:  We do recommend a small meal at first, but then you may proceed to your regular diet.  Drink plenty of fluids but you should avoid alcoholic beverages for 24 hours.  ACTIVITY:  You should plan to take it easy for the rest of today and you should NOT DRIVE or use heavy machinery until tomorrow (because of the sedation medicines used during the test).    FOLLOW UP: Our staff will call the number listed on your records 48-72 hours following your procedure to check on you and address any questions or  concerns that you may have regarding the information given to you following your procedure. If we do not reach you, we will leave a message.  We will attempt to reach you two times.  During this call, we will ask if you have developed any symptoms of COVID 19. If you develop any symptoms (ie: fever, flu-like symptoms, shortness of breath, cough etc.) before then, please call [phone].  If you test positive for Covid 19 in the 2 weeks post procedure, please call and report this information to [place].    If any biopsies were taken you will be contacted by phone or by letter within the next 1-3 weeks.  Please call us at [phone] if you have not heard about the biopsies in 3 weeks.    SIGNATURES/CONFIDENTIALITY: You and/or your care partner have signed paperwork which will be entered into your electronic  medical record.  These signatures attest to the fact that that the information above on your After Visit Summary has been reviewed and is understood.  Full responsibility of the confidentiality of this discharge information lies with you and/or your care-partner.

## 2020-10-10 ENCOUNTER — Telehealth: Payer: Self-pay

## 2020-10-10 NOTE — Telephone Encounter (Signed)
  Follow up Call-  Call back number 10/08/2020  Post procedure Call Back phone  # [phone]  Permission to leave phone message Yes  Some recent data might be hidden     Patient questions:  Do you have a fever, pain , or abdominal swelling? No. Pain Score  0 *  Have you tolerated food without any problems? Yes.    Have you been able to return to your normal activities? Yes.    Do you have any questions about your discharge instructions: Diet   No. Medications  No. Follow up visit  No.  Do you have questions or concerns about your Care? No.  Actions: * If pain score is 4 or above: No action needed, pain <4.  1. Have you developed a fever since your procedure? no  2.   Have you had an respiratory symptoms (SOB or cough) since your procedure? no  3.   Have you tested positive for COVID 19 since your procedure no  4.   Have you had any family members/close contacts diagnosed with the COVID 19 since your procedure?  no   If yes to any of these questions please route to Joylene John, RN and Joella Prince, RN

## 2020-10-15 ENCOUNTER — Encounter: Payer: Self-pay | Admitting: Internal Medicine

## 2020-11-12 ENCOUNTER — Other Ambulatory Visit: Payer: Self-pay

## 2020-11-12 ENCOUNTER — Ambulatory Visit
Admission: RE | Admit: 2020-11-12 | Discharge: 2020-11-12 | Disposition: A | Discharge status: Home / Self Care | Payer: Medicare Other | Source: Ambulatory Visit | Attending: Internal Medicine | Admitting: Internal Medicine

## 2020-11-12 DIAGNOSIS — Z1231 Encounter for screening mammogram for malignant neoplasm of breast: Secondary | ICD-10-CM

## 2020-11-19 ENCOUNTER — Other Ambulatory Visit: Payer: Self-pay

## 2020-12-02 ENCOUNTER — Other Ambulatory Visit: Payer: Self-pay | Admitting: Internal Medicine

## 2020-12-02 NOTE — Telephone Encounter (Signed)
Please refill as per office routine med refill policy (all routine meds refilled for 3 mo or monthly per pt preference up to one year from last visit, then month to month grace period for 3 mo, then further med refills will have to be denied)  

## 2020-12-13 ENCOUNTER — Emergency Department (HOSPITAL_COMMUNITY): Payer: Medicare Other

## 2020-12-13 ENCOUNTER — Observation Stay (HOSPITAL_COMMUNITY)
Admission: EM | Admit: 2020-12-13 | Discharge: 2020-12-14 | Disposition: A | Discharge status: Home / Self Care | Payer: Medicare Other | Attending: Family Medicine | Admitting: Family Medicine

## 2020-12-13 ENCOUNTER — Encounter (HOSPITAL_COMMUNITY): Payer: Self-pay | Admitting: Pharmacy Technician

## 2020-12-13 DIAGNOSIS — G459 Transient cerebral ischemic attack, unspecified: Secondary | ICD-10-CM | POA: Diagnosis not present

## 2020-12-13 DIAGNOSIS — Z7984 Long term (current) use of oral hypoglycemic drugs: Secondary | ICD-10-CM | POA: Insufficient documentation

## 2020-12-13 DIAGNOSIS — M7989 Other specified soft tissue disorders: Secondary | ICD-10-CM

## 2020-12-13 DIAGNOSIS — R262 Difficulty in walking, not elsewhere classified: Secondary | ICD-10-CM | POA: Diagnosis not present

## 2020-12-13 DIAGNOSIS — E1122 Type 2 diabetes mellitus with diabetic chronic kidney disease: Secondary | ICD-10-CM | POA: Insufficient documentation

## 2020-12-13 DIAGNOSIS — K219 Gastro-esophageal reflux disease without esophagitis: Secondary | ICD-10-CM | POA: Diagnosis not present

## 2020-12-13 DIAGNOSIS — Z20822 Contact with and (suspected) exposure to covid-19: Secondary | ICD-10-CM | POA: Diagnosis not present

## 2020-12-13 DIAGNOSIS — R0602 Shortness of breath: Secondary | ICD-10-CM | POA: Diagnosis not present

## 2020-12-13 DIAGNOSIS — E119 Type 2 diabetes mellitus without complications: Secondary | ICD-10-CM | POA: Diagnosis not present

## 2020-12-13 DIAGNOSIS — Z96652 Presence of left artificial knee joint: Secondary | ICD-10-CM | POA: Diagnosis not present

## 2020-12-13 DIAGNOSIS — Z7982 Long term (current) use of aspirin: Secondary | ICD-10-CM | POA: Diagnosis not present

## 2020-12-13 DIAGNOSIS — R531 Weakness: Secondary | ICD-10-CM

## 2020-12-13 DIAGNOSIS — E162 Hypoglycemia, unspecified: Secondary | ICD-10-CM

## 2020-12-13 DIAGNOSIS — E669 Obesity, unspecified: Secondary | ICD-10-CM | POA: Diagnosis present

## 2020-12-13 DIAGNOSIS — E782 Mixed hyperlipidemia: Secondary | ICD-10-CM

## 2020-12-13 DIAGNOSIS — E1169 Type 2 diabetes mellitus with other specified complication: Secondary | ICD-10-CM

## 2020-12-13 DIAGNOSIS — R202 Paresthesia of skin: Secondary | ICD-10-CM | POA: Diagnosis not present

## 2020-12-13 DIAGNOSIS — Z79899 Other long term (current) drug therapy: Secondary | ICD-10-CM | POA: Diagnosis not present

## 2020-12-13 DIAGNOSIS — R200 Anesthesia of skin: Secondary | ICD-10-CM | POA: Diagnosis not present

## 2020-12-13 DIAGNOSIS — I10 Essential (primary) hypertension: Secondary | ICD-10-CM | POA: Diagnosis present

## 2020-12-13 DIAGNOSIS — N1832 Chronic kidney disease, stage 3b: Secondary | ICD-10-CM | POA: Diagnosis not present

## 2020-12-13 DIAGNOSIS — I129 Hypertensive chronic kidney disease with stage 1 through stage 4 chronic kidney disease, or unspecified chronic kidney disease: Secondary | ICD-10-CM | POA: Insufficient documentation

## 2020-12-13 DIAGNOSIS — E785 Hyperlipidemia, unspecified: Secondary | ICD-10-CM | POA: Diagnosis not present

## 2020-12-13 LAB — I-STAT CHEM 8, ED
BUN: 29 mg/dL — ABNORMAL HIGH (ref 8–23)
Calcium, Ion: 1.16 mmol/L (ref 1.15–1.40)
Chloride: 105 mmol/L (ref 98–111)
Creatinine, Ser: 1.8 mg/dL — ABNORMAL HIGH (ref 0.44–1.00)
Glucose, Bld: 63 mg/dL — ABNORMAL LOW (ref 70–99)
HCT: 34 % — ABNORMAL LOW (ref 36.0–46.0)
Hemoglobin: 11.6 g/dL — ABNORMAL LOW (ref 12.0–15.0)
Potassium: 4.6 mmol/L (ref 3.5–5.1)
Sodium: 139 mmol/L (ref 135–145)
TCO2: 25 mmol/L (ref 22–32)

## 2020-12-13 LAB — COMPREHENSIVE METABOLIC PANEL
ALT: 15 U/L (ref 0–44)
AST: 21 U/L (ref 15–41)
Albumin: 3.8 g/dL (ref 3.5–5.0)
Alkaline Phosphatase: 54 U/L (ref 38–126)
Anion gap: 9 (ref 5–15)
BUN: 23 mg/dL (ref 8–23)
CO2: 23 mmol/L (ref 22–32)
Calcium: 8.8 mg/dL — ABNORMAL LOW (ref 8.9–10.3)
Chloride: 106 mmol/L (ref 98–111)
Creatinine, Ser: 1.7 mg/dL — ABNORMAL HIGH (ref 0.44–1.00)
GFR, Estimated: 31 mL/min — ABNORMAL LOW (ref >60)
Glucose, Bld: 67 mg/dL — ABNORMAL LOW (ref 70–99)
Potassium: 4.6 mmol/L (ref 3.5–5.1)
Sodium: 138 mmol/L (ref 135–145)
Total Bilirubin: 0.5 mg/dL (ref 0.3–1.2)
Total Protein: 6.5 g/dL (ref 6.5–8.1)

## 2020-12-13 LAB — CBC
HCT: 34.9 % — ABNORMAL LOW (ref 36.0–46.0)
Hemoglobin: 10.6 g/dL — ABNORMAL LOW (ref 12.0–15.0)
MCH: 27.7 pg (ref 26.0–34.0)
MCHC: 30.4 g/dL (ref 30.0–36.0)
MCV: 91.1 fL (ref 80.0–100.0)
Platelets: 314 10*3/uL (ref 150–400)
RBC: 3.83 MIL/uL — ABNORMAL LOW (ref 3.87–5.11)
RDW: 14 % (ref 11.5–15.5)
WBC: 5.2 10*3/uL (ref 4.0–10.5)
nRBC: 0 % (ref 0.0–0.2)

## 2020-12-13 LAB — DIFFERENTIAL
Abs Immature Granulocytes: 0.01 10*3/uL (ref 0.00–0.07)
Basophils Absolute: 0 10*3/uL (ref 0.0–0.1)
Basophils Relative: 1 %
Eosinophils Absolute: 0.1 10*3/uL (ref 0.0–0.5)
Eosinophils Relative: 2 %
Immature Granulocytes: 0 %
Lymphocytes Relative: 41 %
Lymphs Abs: 2.1 10*3/uL (ref 0.7–4.0)
Monocytes Absolute: 0.4 10*3/uL (ref 0.1–1.0)
Monocytes Relative: 7 %
Neutro Abs: 2.6 10*3/uL (ref 1.7–7.7)
Neutrophils Relative %: 49 %

## 2020-12-13 LAB — CBG MONITORING, ED
Glucose-Capillary: 60 mg/dL — ABNORMAL LOW (ref 70–99)
Glucose-Capillary: 63 mg/dL — ABNORMAL LOW (ref 70–99)
Glucose-Capillary: 77 mg/dL (ref 70–99)
Glucose-Capillary: 149 mg/dL — ABNORMAL HIGH (ref 70–99)

## 2020-12-13 LAB — PROTIME-INR
INR: 0.8 (ref 0.8–1.2)
Prothrombin Time: 11.3 seconds — ABNORMAL LOW (ref 11.4–15.2)

## 2020-12-13 LAB — URINALYSIS, ROUTINE W REFLEX MICROSCOPIC
Bilirubin Urine: NEGATIVE
Glucose, UA: NEGATIVE mg/dL
Hgb urine dipstick: NEGATIVE
Ketones, ur: NEGATIVE mg/dL
Leukocytes,Ua: NEGATIVE
Nitrite: NEGATIVE
Protein, ur: NEGATIVE mg/dL
Specific Gravity, Urine: 1.01 (ref 1.005–1.030)
pH: 5 (ref 5.0–8.0)

## 2020-12-13 LAB — SARS CORONAVIRUS 2 (TAT 6-24 HRS): SARS Coronavirus 2: NEGATIVE

## 2020-12-13 LAB — BRAIN NATRIURETIC PEPTIDE: B Natriuretic Peptide: 24.6 pg/mL (ref 0.0–100.0)

## 2020-12-13 LAB — TROPONIN I (HIGH SENSITIVITY)
Troponin I (High Sensitivity): 4 ng/L (ref <18)
Troponin I (High Sensitivity): 4 ng/L (ref <18)

## 2020-12-13 LAB — APTT: aPTT: 34 seconds (ref 24–36)

## 2020-12-13 MED ORDER — CLOPIDOGREL BISULFATE 75 MG PO TABS: 75.0000 mg | ORAL_TABLET | Freq: Every day | ORAL | Status: DC

## 2020-12-13 MED ORDER — ACETAMINOPHEN 325 MG PO TABS
650.0000 mg | ORAL_TABLET | Freq: Once | ORAL | Status: AC
  Administered 2020-12-13: 650 mg via ORAL
  Filled 2020-12-13: qty 2

## 2020-12-13 MED ORDER — ASPIRIN EC 81 MG PO TBEC
81.0000 mg | DELAYED_RELEASE_TABLET | Freq: Every day | ORAL | Status: DC
  Administered 2020-12-14: 81 mg via ORAL
  Filled 2020-12-13: qty 1

## 2020-12-13 MED ORDER — SODIUM CHLORIDE 0.9% FLUSH
3.0000 mL | Freq: Once | INTRAVENOUS | Status: AC
  Administered 2020-12-13: 3 mL via INTRAVENOUS

## 2020-12-13 MED ORDER — ATORVASTATIN CALCIUM 10 MG PO TABS
20.0000 mg | ORAL_TABLET | Freq: Every day | ORAL | Status: DC
  Administered 2020-12-14: 20 mg via ORAL
  Filled 2020-12-13: qty 2

## 2020-12-13 MED ORDER — STROKE: EARLY STAGES OF RECOVERY BOOK
Freq: Once | Status: AC
  Filled 2020-12-13: qty 1

## 2020-12-13 MED ORDER — CLOPIDOGREL BISULFATE 300 MG PO TABS
300.0000 mg | ORAL_TABLET | Freq: Once | ORAL | Status: AC
  Administered 2020-12-13: 300 mg via ORAL
  Filled 2020-12-13: qty 1

## 2020-12-13 NOTE — ED Notes (Signed)
Pt given [place] sandwich and more orange juice.

## 2020-12-13 NOTE — ED Notes (Signed)
Ambulated to restroom at this time 

## 2020-12-13 NOTE — ED Triage Notes (Signed)
Pt here pov with reports of L sided numbness. Pt reports she went to sleep approx 0030 today. When she woke up at 0600 she noticed her L face and lips were numb. She went back to sleep and when she woke up a little later she got up to go to the restroom and was stumbling. She then noticed her LLE was numb. Pt with no facial droop, no arm drift.

## 2020-12-13 NOTE — ED Provider Notes (Signed)
[facility] DEPARTMENT Provider Note   CSN: [id number] Arrival date & time: 12/13/20  1213     History No chief complaint on file.   Cindy Larson is a 75 y.o. female with PMHx HTN, HLD, Hiatal hernia, GERD who presents to the ED today with complaint of left sided numbness/tingling with LKN around 12:30 AM this morning.  Patient reports she went to sleep around 12:30 AM and did not feel quite like herself.  She states she felt fatigued.  She did not think much of it however when she woke up around 630 this morning she noticed some left-sided facial tingling and numbness.  She went back to sleep and then woke up around 1030 this morning.  She states she was supposed to take her granddaughter to work however did not feel up to it.  She called her daughter to let her know that she did not feel up to it.  Daughter noticed at that time that patient's speech seemed off and advised her to come to the ED for further evaluation.  Patient's other daughter is at bedside currently.  States that her speech does not seem slurred however she does seem more fatigued than normal.  She also mentions that patient had some dyspnea on exertion approximately 3 days ago while working in the garden which is atypical for her.  Patient states she is having some epigastric/substernal chest pain at this time however it is mild.  She took 4 baby aspirin this morning for the pain without much relief.  No history of abdominal aortic aneurysm.  She is not currently short of breath.  Patient did not think to mention her chest pain or shortness of breath until her daughter brought it up today.  No history of stroke.   The history is provided by the patient, medical records and a relative.       Past Medical History:  Diagnosis Date  . ALLERGIC RHINITIS   . ANXIETY   . COLONIC POLYPS, HX OF   . DEPRESSION   . DIABETES MELLITUS, TYPE II   . Diverticulitis   . DIVERTICULOSIS, COLON   .  Esophageal stricture   . Gallstones   . GERD   . HIATAL HERNIA   . History of blood transfusion    left knee replacement  . HYPERLIPIDEMIA   . Hypersomnolence   . HYPERTENSION   . INTERMITTENT VERTIGO   . OBESITY   . OSTEOARTHRITIS    L knee  . OSTEOPENIA   . Pneumonia   . Post-menopausal    hormone replacement therapy    Patient Active Problem List   Diagnosis Date Noted  . TIA (transient ischemic attack)   . Right knee pain 02/17/2020  . Acute sinus infection 01/03/2020  . Left-sided face pain 02/24/2019  . CKD (chronic kidney disease) 06/01/2018  . Diverticulitis of colon without hemorrhage 04/29/2018  . Bilateral shoulder pain 01/31/2016  . Chest pain 04/18/2015  . Left arm pain 07/25/2013  . Neck pain on left side 06/15/2013  . LLQ abdominal pain 10/11/2012  . Back pain 03/03/2012  . Right upper lobe pneumonia 02/16/2012  . Encounter for well adult exam with abnormal findings 05/21/2011  . ABDOMINAL PAIN OTHER SPECIFIED SITE 10/24/2010  . INTERMITTENT VERTIGO 08/07/2009  . Dehydration 01/11/2009  . OSTEOPENIA 04/09/2008  . PARESTHESIA 03/08/2008  . Diabetes ([place]) 07/03/2007  . Hyperlipidemia 07/03/2007  . OBESITY 07/03/2007  . Anxiety state 07/03/2007  . Depression 07/03/2007  .  Essential hypertension 07/03/2007  . Allergic rhinitis 07/03/2007  . GERD 07/03/2007  . HIATAL HERNIA 07/03/2007  . DIVERTICULOSIS, COLON 07/03/2007  . OSTEOARTHRITIS 07/03/2007  . History of colon polyps 07/03/2007    Past Surgical History:  Procedure Laterality Date  . ABDOMINAL HYSTERECTOMY    . CESAREAN SECTION    . CHOLECYSTECTOMY    . COLONOSCOPY  03/02/2017   [name]  . Lysis of Adhesions    . OOPHORECTOMY     one  . POLYPECTOMY    . ROTATOR CUFF REPAIR Left   . TOTAL KNEE ARTHROPLASTY Left      OB History   No obstetric history on file.     Family History  Problem Relation Age of Onset  . Hypertension Mother   . Atrial fibrillation Mother   . Heart  disease Mother        CHF  . Cancer Mother        Cancer in "thigh"  . Colon cancer Paternal Aunt        originated as breast CA  . Breast cancer Paternal Aunt   . Lung cancer Brother        smoker  . Diabetes Other        paternal Aunt and uncle  . Breast cancer Maternal Grandmother   . Ovarian cancer Sister   . Heart failure Brother        CHF  . Kidney disease Maternal Aunt   . Esophageal cancer Neg Hx   . Rectal cancer Neg Hx   . Stomach cancer Neg Hx   . Colon polyps Neg Hx     Social History   Tobacco Use  . Smoking status: Never Smoker  . Smokeless tobacco: Never Used  Vaping Use  . Vaping Use: Never used  Substance Use Topics  . Alcohol use: No  . Drug use: No    Home Medications Prior to Admission medications   Medication Sig Start Date End Date Taking? Authorizing Provider  acetaminophen (TYLENOL) 500 MG tablet Take 500-1,000 mg by mouth every 8 (eight) hours as needed (for pain).   Yes [provider]  ALPRAZolam (XANAX) 0.25 MG tablet Take 1 tablet by mouth twice daily as needed for anxiety Patient taking differently: Take 0.25 mg by mouth 2 (two) times daily as needed for anxiety. 07/18/20  Yes Biagio Borg, MD  aspirin EC 81 MG tablet Take 1 tablet (81 mg total) by mouth daily. 04/18/15  Yes Biagio Borg, MD  atorvastatin (LIPITOR) 20 MG tablet Take 1 tablet by mouth once daily Patient taking differently: Take 20 mg by mouth at bedtime. 09/11/20  Yes Biagio Borg, MD  Cholecalciferol (VITAMIN D3) 50 MCG (2000 UT) TABS Take 2,000 Units by mouth daily.   Yes [provider]  citalopram (CELEXA) 10 MG tablet Take 1 tablet (10 mg total) by mouth daily. 02/16/20  Yes Biagio Borg, MD  glimepiride (AMARYL) 4 MG tablet Take 1 tablet by mouth twice daily Patient taking differently: Take 4 mg by mouth 2 (two) times daily with a meal. 12/02/20  Yes Biagio Borg, MD  guaiFENesin (MUCINEX) 600 MG 12 hr tablet Take 2 tablets (1,200 mg total) by mouth  2 (two) times daily as needed. Patient taking differently: Take 1,200 mg by mouth 2 (two) times daily as needed for to loosen phlegm. 02/16/20  Yes Biagio Borg, MD  lisinopril (ZESTRIL) 5 MG tablet Take 1 tablet by mouth once daily Patient  taking differently: Take 5 mg by mouth in the morning. 12/02/20  Yes Biagio Borg, MD  meclizine (ANTIVERT) 25 MG tablet TAKE ONE TABLET BY MOUTH EVERY 6 HOURS AS NEEDED FOR DIZZINESS Patient taking differently: Take 25 mg by mouth every 6 (six) hours as needed for dizziness. 10/31/18  Yes Biagio Borg, MD  metFORMIN (GLUCOPHAGE) 1000 MG tablet Take 1 tablet (1,000 mg total) by mouth 2 (two) times daily with a meal. 02/16/20  Yes Biagio Borg, MD  Multiple Vitamins-Minerals (ALIVE WOMENS GUMMY) CHEW Chew 1 tablet by mouth daily.   Yes [provider]  omeprazole (PRILOSEC) 20 MG capsule TAKE 1 CAPSULE BY MOUTH TWICE DAILY BEFORE MEAL(S) Patient taking differently: Take 40 mg by mouth in the morning. 07/29/20  Yes Biagio Borg, MD  Polyethyl Glycol-Propyl Glycol (SYSTANE OP) Place 1 drop into both eyes in the morning and at bedtime.   Yes [provider]  promethazine (PHENERGAN) 25 MG tablet TAKE ONE TABLET BY MOUTH EVERY 6 HOURS AS NEEDED FOR NAUSEA Patient taking differently: Take 25 mg by mouth every 6 (six) hours as needed for nausea. 11/19/16  Yes Biagio Borg, MD  traMADol (ULTRAM) 50 MG tablet Take 1 tablet (50 mg total) by mouth every 6 (six) hours as needed. Patient taking differently: Take 50 mg by mouth every 6 (six) hours as needed (for pain). 02/16/20  Yes Biagio Borg, MD  triamcinolone (NASACORT) 55 MCG/ACT AERO nasal inhaler Place 2 sprays into the nose daily. Patient taking differently: Place 2 sprays into the nose daily as needed (for allergies or sinus issues). 02/16/20  Yes Biagio Borg, MD  Continuous Blood Gluc Receiver (FREESTYLE LIBRE READER) DEVI Use as directed once daily E11.9 Patient not taking: No sig reported  02/16/20   Biagio Borg, MD  Continuous Blood Gluc Sensor ([place]) MISC Use as directed daily, change every 14 days E11.9 Patient not taking: Reported on 12/13/2020 02/16/20   Biagio Borg, MD  fexofenadine (ALLEGRA) 180 MG tablet Take 1 tablet (180 mg total) by mouth daily. Patient not taking: Reported on 12/13/2020 02/16/20 02/15/21  Biagio Borg, MD  PARoxetine (PAXIL) 20 MG tablet Take 1 tablet (20 mg total) by mouth daily. Patient not taking: Reported on 12/13/2020 04/12/19   Biagio Borg, MD    Allergies    Aspirin, Fexofenadine, Nsaids, Penicillins, Prevnar [pneumococcal 13-val conj vacc], and Zocor [simvastatin]  Review of Systems   Review of Systems  Constitutional: Negative for chills and fever.  Respiratory: Positive for shortness of breath.   Cardiovascular: Positive for chest pain.  Neurological: Positive for speech difficulty, weakness and numbness.  All other systems reviewed and are negative.   Physical Exam Updated Vital Signs BP 112/62 (BP Location: Left Arm)   Pulse 92   Temp 97.6 F (36.4 C) (Oral)   Resp 18   SpO2 100%   Physical Exam Vitals and nursing note reviewed.  Constitutional:      Appearance: She is not ill-appearing or diaphoretic.  HENT:     Head: Normocephalic and atraumatic.  Eyes:     Extraocular Movements: Extraocular movements intact.     Conjunctiva/sclera: Conjunctivae normal.     Pupils: Pupils are equal, round, and reactive to light.  Cardiovascular:     Rate and Rhythm: Normal rate and regular rhythm.  Pulmonary:     Effort: Pulmonary effort is normal.     Breath sounds: Normal breath sounds. No  wheezing, rhonchi or rales.  Abdominal:     Palpations: Abdomen is soft.     Tenderness: There is no abdominal tenderness. There is no guarding or rebound.  Musculoskeletal:     Cervical back: Neck supple.  Skin:    General: Skin is warm and dry.  Neurological:     Mental Status: She is alert.     Comments:  Alert and oriented to self, place, time and event.   Speech is fluent, clear without dysarthria or dysphasia.   Strength 4/5 to LLE. Strength 5/5 to BUEs and RLE. Sensation decreased to entire left side.   No pronator drift.  Normal finger-to-nose and feet tapping.  CN I not tested  CN II grossly intact visual fields bilaterally. Did not visualize posterior eye.   CN III, IV, VI PERRLA and EOMs intact bilaterally  CN VII facial movements symmetric  CN VIII not tested  CN IX, X no uvula deviation, symmetric rise of soft palate  CN XI 5/5 SCM and trapezius strength bilaterally  CN XII Midline tongue protrusion, symmetric L/R movements      ED Results / Procedures / Treatments   Labs (all labs ordered are listed, but only abnormal results are displayed) Labs Reviewed  PROTIME-INR - Abnormal; Notable for the following components:      Result Value   Prothrombin Time 11.3 (*)    All other components within normal limits  CBC - Abnormal; Notable for the following components:   RBC 3.83 (*)    Hemoglobin 10.6 (*)    HCT 34.9 (*)    All other components within normal limits  COMPREHENSIVE METABOLIC PANEL - Abnormal; Notable for the following components:   Glucose, Bld 67 (*)    Creatinine, Ser 1.70 (*)    Calcium 8.8 (*)    GFR, Estimated 31 (*)    All other components within normal limits  URINALYSIS, ROUTINE W REFLEX MICROSCOPIC - Abnormal; Notable for the following components:   Color, Urine STRAW (*)    All other components within normal limits  I-STAT CHEM 8, ED - Abnormal; Notable for the following components:   BUN 29 (*)    Creatinine, Ser 1.80 (*)    Glucose, Bld 63 (*)    Hemoglobin 11.6 (*)    HCT 34.0 (*)    All other components within normal limits  CBG MONITORING, ED - Abnormal; Notable for the following components:   Glucose-Capillary 60 (*)    All other components within normal limits  CBG MONITORING, ED - Abnormal; Notable for the following components:    Glucose-Capillary 63 (*)    All other components within normal limits  CBG MONITORING, ED - Abnormal; Notable for the following components:   Glucose-Capillary 149 (*)    All other components within normal limits  SARS CORONAVIRUS 2 (TAT 6-24 HRS)  APTT  DIFFERENTIAL  BRAIN NATRIURETIC PEPTIDE  HEMOGLOBIN A1C  LIPID PANEL  CBG MONITORING, ED  TROPONIN I (HIGH SENSITIVITY)  TROPONIN I (HIGH SENSITIVITY)    EKG None  Radiology CT HEAD WO CONTRAST  Result Date: 12/13/2020 CLINICAL DATA:  Left-sided facial numbness, difficulty ambulating, left lower extremity numbness EXAM: CT HEAD WITHOUT CONTRAST TECHNIQUE: Contiguous axial images were obtained from the base of the skull through the vertex without intravenous contrast. COMPARISON:  02/22/2012 FINDINGS: Brain: No acute infarct or hemorrhage. Hypodensities in the bilateral frontal periventricular white matter consistent with chronic small vessel ischemic changes, stable. Lateral ventricles and remaining midline structures are unremarkable.  No acute extra-axial fluid collections. No mass effect. Vascular: No hyperdense vessel or unexpected calcification. Skull: Normal. Negative for fracture or focal lesion. Sinuses/Orbits: No acute finding. Other: None. IMPRESSION: 1. No acute intracranial process. Electronically Signed   By: Randa Ngo M.D.   On: 12/13/2020 15:14   DG Chest Port 1 View  Result Date: 12/13/2020 CLINICAL DATA:  Short of breath.  Left-sided numbness. EXAM: PORTABLE CHEST 1 VIEW COMPARISON:  04/18/2015 FINDINGS: The heart size and mediastinal contours are within normal limits. Both lungs are clear. The visualized skeletal structures are unremarkable. IMPRESSION: No active disease. Electronically Signed   By: Franchot Gallo M.D.   On: 12/13/2020 13:26    Procedures Procedures   Medications Ordered in ED Medications  clopidogrel (PLAVIX) tablet 75 mg (has no administration in time range)  sodium chloride flush (NS) 0.9 %  injection 3 mL (3 mLs Intravenous Given 12/13/20 1319)   stroke: mapping our early stages of recovery book ( Does not apply Given 12/13/20 1816)  clopidogrel (PLAVIX) tablet 300 mg (300 mg Oral Given 12/13/20 1829)    ED Course  I have reviewed the triage vital signs and the nursing notes.  Pertinent labs & imaging results that were available during my care of the patient were reviewed by me and considered in my medical decision making (see chart for details).    MDM Rules/Calculators/A&P                          75 year old female presents to the ED today with complaint of left-sided facial numbness/tingling as well as left lower extremity tingling/weakness.  Last known normal around 12:30 AM this morning prior to going to sleep.  No history of stroke.  Came by personal vehicle with family members who noticed that her speech seemed off earlier today.  Speech is intact at this time however daughter does report it seems more slowed than normal.  Vital signs are stable on arrival.  On exam patient is noted to have left lower extremity weakness, strength 4 out of 5.  5 out of 5 to bilateral upper extremities and right lower extremity.  She also has decreased sensation to the entire left side of her body.  Daughter mentions that patient was having some dyspnea on exertion a couple of days ago and is currently also complaining of some substernal/epigastric pain.  Patient did not think to mention this prior to daughter bringing it up.  She has equal pulses bilaterally.  Blood pressure stable at 112/62.  Per previous images there is no findings to suggest abdominal aneurysm.  This is lower on my differential at this time however will obtain a chest x-ray to assess for mediastinal widening as well as troponin and BNP.  Patient will need CT head.  This is already been ordered, currently awaiting however she does not meet code stroke criteria at this time.  If negative will likely need an MRI to rule out stroke at  this time.   CBG 60 on arrival. Pt provided with orange juice and crackers after passing swallow screen. Repeat 77. She has not eaten or drank anything all day today. Will continue to monitor.   CBC without leukocytosis. Hgb stable at 10.6 CMP with stable creatinine 1.70. No other electrolyte abnormalities Troponin 4 BNP 24.6 CXR clear without signs of mediastinal widening CT Head negative  Discussed case with Neurologist Dr. Leonel Ramsay who recommends MRI Brain, MRA Head and Neck without contrast  and will come evaluate patient for possible stroke. Appreciate his involvement.   Dr. Leonel Ramsay has evaluated patient. Please see consult note. Recommends admission to hospitalist for TIA/stroke workup. Pt has not received MRI's yet however do not feel this will change course. Will call to admit.   Discussed case with [facility]ist Dr. Josephine Cables who agrees to evaluate patient for admission.   This note was prepared using Dragon voice recognition software and may include unintentional dictation errors due to the inherent limitations of voice recognition software.  Final Clinical Impression(s) / ED Diagnoses Final diagnoses:  Left-sided weakness    Rx / DC Orders ED Discharge Orders    None       Eustaquio Maize, PA-C 12/13/20 1944    Arnaldo Natal, MD 12/14/20 [phone]

## 2020-12-13 NOTE — Progress Notes (Signed)
Second attempt to receive report.

## 2020-12-13 NOTE — H&P (Incomplete)
History and Physical  [name] NTZ:[id number] DOB: Jul 31, 1946 DOA: 12/13/2020  Referring physician: Eustaquio Maize, PA-C PCP: Biagio Borg, MD  Patient coming from: home  Chief Complaint: Left-sided numbness and tingling  HPI: Cindy Larson is a 75 y.o. female with medical history significant for hypertension, hyperlipidemia, GERD, hiatal hernia who presents to the emergency department due to left-sided numbness/tingling which she first noted around 12: 30 a.m. this morning when she woke up from sleep, she did not think much of it, but on waking up around 6:30 AM she noted facial tingling and numbness on left side of the face, she went back to sleep again and woke up around 10:30 AM and tried to use a restroom, she noted that she was staggering while walking, so she called her daughter     ED Course: ***  Review of Systems: Review of systems as noted in the HPI. All other systems reviewed and are negative.   Past Medical History:  Diagnosis Date  . ALLERGIC RHINITIS   . ANXIETY   . COLONIC POLYPS, HX OF   . DEPRESSION   . DIABETES MELLITUS, TYPE II   . Diverticulitis   . DIVERTICULOSIS, COLON   . Esophageal stricture   . Gallstones   . GERD   . HIATAL HERNIA   . History of blood transfusion    left knee replacement  . HYPERLIPIDEMIA   . Hypersomnolence   . HYPERTENSION   . INTERMITTENT VERTIGO   . OBESITY   . OSTEOARTHRITIS    L knee  . OSTEOPENIA   . Pneumonia   . Post-menopausal    hormone replacement therapy   Past Surgical History:  Procedure Laterality Date  . ABDOMINAL HYSTERECTOMY    . CESAREAN SECTION    . CHOLECYSTECTOMY    . COLONOSCOPY  03/02/2017   [name]  . Lysis of Adhesions    . OOPHORECTOMY     one  . POLYPECTOMY    . ROTATOR CUFF REPAIR Left   . TOTAL KNEE ARTHROPLASTY Left     Social History:  reports that she has never smoked. She has never used smokeless tobacco. She reports that she does not drink alcohol and  does not use drugs.   Allergies  Allergen Reactions  . Aspirin Nausea Only and Other (See Comments)    Made the stomach "hurt"  . Fexofenadine Other (See Comments)    "This makes me feel badly. I didn't feel well when I took it"  . Nsaids Nausea Only and Other (See Comments)    The patient CAN tolerate Tramadol and Tylenol- no Aleve or Motrin, though  . Penicillins Hives and Other (See Comments)    Blisters, also  . Prevnar [Pneumococcal 13-Val Conj Vacc] Swelling and Other (See Comments)    Left arm became swollen and welts appeared  . Zocor [Simvastatin] Itching    Family History  Problem Relation Age of Onset  . Hypertension Mother   . Atrial fibrillation Mother   . Heart disease Mother        CHF  . Cancer Mother        Cancer in "thigh"  . Colon cancer Paternal Aunt        originated as breast CA  . Breast cancer Paternal Aunt   . Lung cancer Brother        smoker  . Diabetes Other        paternal Aunt and uncle  . Breast cancer Maternal Grandmother   .  Ovarian cancer Sister   . Heart failure Brother        CHF  . Kidney disease Maternal Aunt   . Esophageal cancer Neg Hx   . Rectal cancer Neg Hx   . Stomach cancer Neg Hx   . Colon polyps Neg Hx     ***  Prior to Admission medications   Medication Sig Start Date End Date Taking? Authorizing Provider  acetaminophen (TYLENOL) 500 MG tablet Take 500-1,000 mg by mouth every 8 (eight) hours as needed (for pain).   Yes [provider]  ALPRAZolam (XANAX) 0.25 MG tablet Take 1 tablet by mouth twice daily as needed for anxiety Patient taking differently: Take 0.25 mg by mouth 2 (two) times daily as needed for anxiety. 07/18/20  Yes Biagio Borg, MD  aspirin EC 81 MG tablet Take 1 tablet (81 mg total) by mouth daily. 04/18/15  Yes Biagio Borg, MD  atorvastatin (LIPITOR) 20 MG tablet Take 1 tablet by mouth once daily Patient taking differently: Take 20 mg by mouth at bedtime. 09/11/20  Yes Biagio Borg, MD   Cholecalciferol (VITAMIN D3) 50 MCG (2000 UT) TABS Take 2,000 Units by mouth daily.   Yes [provider]  citalopram (CELEXA) 10 MG tablet Take 1 tablet (10 mg total) by mouth daily. 02/16/20  Yes Biagio Borg, MD  glimepiride (AMARYL) 4 MG tablet Take 1 tablet by mouth twice daily Patient taking differently: Take 4 mg by mouth 2 (two) times daily with a meal. 12/02/20  Yes Biagio Borg, MD  guaiFENesin (MUCINEX) 600 MG 12 hr tablet Take 2 tablets (1,200 mg total) by mouth 2 (two) times daily as needed. Patient taking differently: Take 1,200 mg by mouth 2 (two) times daily as needed for to loosen phlegm. 02/16/20  Yes Biagio Borg, MD  lisinopril (ZESTRIL) 5 MG tablet Take 1 tablet by mouth once daily Patient taking differently: Take 5 mg by mouth in the morning. 12/02/20  Yes Biagio Borg, MD  meclizine (ANTIVERT) 25 MG tablet TAKE ONE TABLET BY MOUTH EVERY 6 HOURS AS NEEDED FOR DIZZINESS Patient taking differently: Take 25 mg by mouth every 6 (six) hours as needed for dizziness. 10/31/18  Yes Biagio Borg, MD  metFORMIN (GLUCOPHAGE) 1000 MG tablet Take 1 tablet (1,000 mg total) by mouth 2 (two) times daily with a meal. 02/16/20  Yes Biagio Borg, MD  Multiple Vitamins-Minerals (ALIVE WOMENS GUMMY) CHEW Chew 1 tablet by mouth daily.   Yes [provider]  omeprazole (PRILOSEC) 20 MG capsule TAKE 1 CAPSULE BY MOUTH TWICE DAILY BEFORE MEAL(S) Patient taking differently: Take 40 mg by mouth in the morning. 07/29/20  Yes Biagio Borg, MD  Polyethyl Glycol-Propyl Glycol (SYSTANE OP) Place 1 drop into both eyes in the morning and at bedtime.   Yes [provider]  promethazine (PHENERGAN) 25 MG tablet TAKE ONE TABLET BY MOUTH EVERY 6 HOURS AS NEEDED FOR NAUSEA Patient taking differently: Take 25 mg by mouth every 6 (six) hours as needed for nausea. 11/19/16  Yes Biagio Borg, MD  traMADol (ULTRAM) 50 MG tablet Take 1 tablet (50 mg total) by mouth every 6 (six) hours as  needed. Patient taking differently: Take 50 mg by mouth every 6 (six) hours as needed (for pain). 02/16/20  Yes Biagio Borg, MD  triamcinolone (NASACORT) 55 MCG/ACT AERO nasal inhaler Place 2 sprays into the nose daily. Patient taking differently: Place 2 sprays into the nose  daily as needed (for allergies or sinus issues). 02/16/20  Yes Biagio Borg, MD  Continuous Blood Gluc Receiver (FREESTYLE LIBRE READER) DEVI Use as directed once daily E11.9 Patient not taking: No sig reported 02/16/20   Biagio Borg, MD  Continuous Blood Gluc Sensor (Steele) MISC Use as directed daily, change every 14 days E11.9 Patient not taking: Reported on 12/13/2020 02/16/20   Biagio Borg, MD  fexofenadine (ALLEGRA) 180 MG tablet Take 1 tablet (180 mg total) by mouth daily. Patient not taking: Reported on 12/13/2020 02/16/20 02/15/21  Biagio Borg, MD  PARoxetine (PAXIL) 20 MG tablet Take 1 tablet (20 mg total) by mouth daily. Patient not taking: Reported on 12/13/2020 04/12/19   Biagio Borg, MD    Physical Exam: BP 115/60 (BP Location: Right Arm)   Pulse 80   Temp 97.6 F (36.4 C) (Oral)   Resp 16   SpO2 100%   . General: 75 y.o. year-old female well developed well nourished in no acute distress.  Alert and oriented x3. . Cardiovascular: Regular rate and rhythm with no rubs or gallops.  No thyromegaly or JVD noted.  No lower extremity edema. 2/4 pulses in all 4 extremities. Marland Kitchen Respiratory: Clear to auscultation with no wheezes or rales. Good inspiratory effort. . Abdomen: Soft nontender nondistended with normal bowel sounds x4 quadrants. . Muskuloskeletal: No cyanosis, clubbing or edema noted bilaterally . Neuro: CN II-XII intact, strength, sensation, reflexes . Skin: No ulcerative lesions noted or rashes . Psychiatry: Judgement and insight appear normal. Mood is appropriate for condition and setting          Labs on Admission:  Basic Metabolic Panel: Recent Labs  Lab  12/13/20 1250 12/13/20 1334  NA 138 139  K 4.6 4.6  CL 106 105  CO2 23  --   GLUCOSE 67* 63*  BUN 23 29*  CREATININE 1.70* 1.80*  CALCIUM 8.8*  --    Liver Function Tests: Recent Labs  Lab 12/13/20 1250  AST 21  ALT 15  ALKPHOS 54  BILITOT 0.5  PROT 6.5  ALBUMIN 3.8   No results for input(s): LIPASE, AMYLASE in the last 168 hours. No results for input(s): AMMONIA in the last 168 hours. CBC: Recent Labs  Lab 12/13/20 1250 12/13/20 1334  WBC 5.2  --   NEUTROABS 2.6  --   HGB 10.6* 11.6*  HCT 34.9* 34.0*  MCV 91.1  --   PLT 314  --    Cardiac Enzymes: No results for input(s): CKTOTAL, CKMB, CKMBINDEX, TROPONINI in the last 168 hours.  BNP (last 3 results) Recent Labs    12/13/20 1250  BNP 24.6    ProBNP (last 3 results) No results for input(s): PROBNP in the last 8760 hours.  CBG: Recent Labs  Lab 12/13/20 1322 12/13/20 1340 12/13/20 1435 12/13/20 1743  GLUCAP 60* 63* 77 149*    Radiological Exams on Admission: CT HEAD WO CONTRAST  Result Date: 12/13/2020 CLINICAL DATA:  Left-sided facial numbness, difficulty ambulating, left lower extremity numbness EXAM: CT HEAD WITHOUT CONTRAST TECHNIQUE: Contiguous axial images were obtained from the base of the skull through the vertex without intravenous contrast. COMPARISON:  02/22/2012 FINDINGS: Brain: No acute infarct or hemorrhage. Hypodensities in the bilateral frontal periventricular white matter consistent with chronic small vessel ischemic changes, stable. Lateral ventricles and remaining midline structures are unremarkable. No acute extra-axial fluid collections. No mass effect. Vascular: No hyperdense vessel or unexpected calcification. Skull: Normal. Negative for  fracture or focal lesion. Sinuses/Orbits: No acute finding. Other: None. IMPRESSION: 1. No acute intracranial process. Electronically Signed   By: Randa Ngo M.D.   On: 12/13/2020 15:14   DG Chest Port 1 View  Result Date:  12/13/2020 CLINICAL DATA:  Short of breath.  Left-sided numbness. EXAM: PORTABLE CHEST 1 VIEW COMPARISON:  04/18/2015 FINDINGS: The heart size and mediastinal contours are within normal limits. Both lungs are clear. The visualized skeletal structures are unremarkable. IMPRESSION: No active disease. Electronically Signed   By: Franchot Gallo M.D.   On: 12/13/2020 13:26    EKG: I independently viewed the EKG done and my findings are as followed: ***   Assessment/Plan Present on Admission: . TIA (transient ischemic attack)  Active Problems:   TIA (transient ischemic attack)   DVT prophylaxis: ***   Code Status: ***   Family Communication: ***   Disposition Plan: ***   Consults called: ***   Admission status: ***     Bernadette Hoit MD [facility] Pager [phone]  If 7PM-7AM, please contact night-coverage [email or web address] Password [email or web address]  12/13/2020, 7:46 PM

## 2020-12-13 NOTE — ED Provider Notes (Signed)
Patient placed in Quick Look pathway, seen and evaluated   Chief Complaint: weakness and face numbness since 6am.   HPI:   Pt ;ast normal last pm around midnight.   ROS: n fever, no cough   Physical Exam:   Gen: No distress  Neuro: Awake and Alert  Skin: Warm    Focused Exam: decreased strength left  Lungs clear, Heart RRR   Initiation of care has begun. The patient has been counseled on the process, plan, and necessity for staying for the completion/evaluation, and the remainder of the medical screening examination MSE was initiated and I personally evaluated the patient and placed orders (if any) at  12:32 PM on December 13, 2020.  The patient appears stable so that the remainder of the MSE may be completed by another provider.   Sidney Ace 12/13/20 1232    Arnaldo Natal, MD 12/13/20 [phone]

## 2020-12-13 NOTE — ED Notes (Signed)
Attempted to give reportx1 

## 2020-12-13 NOTE — Consult Note (Signed)
NEUROLOGY CONSULTATION NOTE   Date of service: December 13, 2020 Patient Name: Cindy Larson MRN:  [id number] DOB:  April 07, 1946 Reason for consult: "focal neurological deficits" _ _ _   _ __   _ __ _ _  __ __   _ __   __ _  History of Present Illness  Jamal Haskin is a 75 y.o. female with PMH significant for  has a past medical history of ALLERGIC RHINITIS, ANXIETY, COLONIC POLYPS, HX OF, DEPRESSION, DIABETES MELLITUS, TYPE II, Diverticulitis, DIVERTICULOSIS, COLON, Esophageal stricture, Gallstones, GERD, HIATAL HERNIA, History of blood transfusion, HYPERLIPIDEMIA, Hypersomnolence, HYPERTENSION, INTERMITTENT VERTIGO, OBESITY, OSTEOARTHRITIS, OSTEOPENIA, Pneumonia, and Post-menopausal. who presents with left sided weakness.  She reports that she woke up around 6:30 this morning feeling "unwell.: She placed a heating pad under her back and went back to sleep. She woke up around 10 and had numbness and tingling in her Left side. She got up to go to the bathroom and had weakness in her left leg. She called her daughter and she noted slurred speech. Her other daughter came over to her house and made the patient come to the hospital. She also took 4 baby aspirin before coming to the hospital. She reports that she does have diabetes but that her monitor broke a while ago so she does not regularly check it but that she is diet controlled.  The daughter reports that she has noticed improved strength in his mother since she has been in the ED.  The daughter does report that 4 days ago the patient did have some chest pain and fatigue while attempting to garden. Her son attempted to get her to go to the hospital but she refused and only took a single baby aspirin.   ROS   Constitutional Denies weight loss, fever and chills.   HEENT Denies changes in vision and hearing.   Respiratory Denies SOB and cough.   CV Denies palpitations. Reports some chest pain  GI Denies abdominal pain, nausea,  vomiting and diarrhea.   GU Denies dysuria and urinary frequency.   MSK Denies myalgia and joint pain.   Skin Denies rash and pruritus.   Neurological Denies headache and syncope.   Psychiatric Denies recent changes in mood. Denies anxiety and depression.    Past History   Past Medical History:  Diagnosis Date  . ALLERGIC RHINITIS   . ANXIETY   . COLONIC POLYPS, HX OF   . DEPRESSION   . DIABETES MELLITUS, TYPE II   . Diverticulitis   . DIVERTICULOSIS, COLON   . Esophageal stricture   . Gallstones   . GERD   . HIATAL HERNIA   . History of blood transfusion    left knee replacement  . HYPERLIPIDEMIA   . Hypersomnolence   . HYPERTENSION   . INTERMITTENT VERTIGO   . OBESITY   . OSTEOARTHRITIS    L knee  . OSTEOPENIA   . Pneumonia   . Post-menopausal    hormone replacement therapy   Past Surgical History:  Procedure Laterality Date  . ABDOMINAL HYSTERECTOMY    . CESAREAN SECTION    . CHOLECYSTECTOMY    . COLONOSCOPY  03/02/2017   [name]  . Lysis of Adhesions    . OOPHORECTOMY     one  . POLYPECTOMY    . ROTATOR CUFF REPAIR Left   . TOTAL KNEE ARTHROPLASTY Left    Family History  Problem Relation Age of Onset  . Hypertension Mother   .  Atrial fibrillation Mother   . Heart disease Mother        CHF  . Cancer Mother        Cancer in "thigh"  . Colon cancer Paternal Aunt        originated as breast CA  . Breast cancer Paternal Aunt   . Lung cancer Brother        smoker  . Diabetes Other        paternal Aunt and uncle  . Breast cancer Maternal Grandmother   . Ovarian cancer Sister   . Heart failure Brother        CHF  . Kidney disease Maternal Aunt   . Esophageal cancer Neg Hx   . Rectal cancer Neg Hx   . Stomach cancer Neg Hx   . Colon polyps Neg Hx    Social History   Socioeconomic History  . Marital status: Married    Spouse name: Not on file  . Number of children: 3  . Years of education: Not on file  . Highest education level: Not on file   Occupational History  . Occupation: Control and instrumentation engineer  Tobacco Use  . Smoking status: Never Smoker  . Smokeless tobacco: Never Used  Vaping Use  . Vaping Use: Never used  Substance and Sexual Activity  . Alcohol use: No  . Drug use: No  . Sexual activity: Not on file  Other Topics Concern  . Not on file  Social History Narrative  . Not on file   Social Determinants of Health   Financial Resource Strain: Not on file  Food Insecurity: Not on file  Transportation Needs: Not on file  Physical Activity: Not on file  Stress: Not on file  Social Connections: Not on file   Allergies  Allergen Reactions  . Aspirin Nausea Only and Other (See Comments)    Made the stomach "hurt"  . Fexofenadine Other (See Comments)    "This makes me feel badly. I didn't feel well when I took it"  . Nsaids Nausea Only and Other (See Comments)    The patient CAN tolerate Tramadol and Tylenol- no Aleve or Motrin, though  . Penicillins Hives and Other (See Comments)    Blisters, also  . Prevnar [Pneumococcal 13-Val Conj Vacc] Swelling and Other (See Comments)    Left arm became swollen and welts appeared  . Zocor [Simvastatin] Itching    Medications  (Not in a hospital admission)    Vitals   Vitals:   12/13/20 1315 12/13/20 1330 12/13/20 1345 12/13/20 1400  BP: 111/61 123/65 (!) 114/93 115/63  Pulse: 82 84 87 84  Resp: 10 10 16 13   Temp:      TempSrc:      SpO2: 98% 100% 99% 99%     There is no height or weight on file to calculate BMI.  Physical Exam   General: Laying comfortably in bed; in no acute distress.  HENT: Normal oropharynx and mucosa. Normal external appearance of ears and nose.  Neck: Supple, no pain or tenderness  CV: No JVD. No peripheral edema.  Pulmonary: Symmetric Chest rise. Normal respiratory effort.  Abdomen: Soft to touch, non-tender.  Ext: No cyanosis, edema, or deformity  Skin: No rash. Normal palpation of skin.   Musculoskeletal: Normal digits and nails by  inspection. No clubbing.   Neurologic Examination  Mental status/Cognition: Alert, oriented to self, place, month and year, good attention. Able to follow all commands Speech/language: Fluent, comprehension intact, object naming  intact, repetition intact.  Cranial nerves:   CN II Pupils equal and reactive to light, no VF deficits    CN III,IV,VI EOM intact, no gaze preference or deviation, no nystagmus    CN V Reports decreased sensation on Left side of face   CN VII no asymmetry, no nasolabial fold flattening    CN VIII normal hearing to speech    CN IX & X normal palatal elevation, no uvular deviation    CN XI 5/5 head turn and 5/5 shoulder shrug bilaterally    CN XII midline tongue protrusion    Motor:  RUE: 5/5   LUE: 5-/5 RLE: 5/5   LLE: 5/5 Muscle bulk: normal, tone normal, no pronator drift or tremor present  Sensation:  Light touch Reports decreased sensation in Right arm and leg   Pin prick    Temperature Reports decreased cold sensation in her Left Arm   Vibration   Proprioception    Coordination/Complex Motor:  - Finger to Nose intact bilaterally - Heel to shin intact bilaterally - Rapid alternating movement intact bilaterally - Gait: deferred  Labs   CBC:  Recent Labs  Lab 12/13/20 1250 12/13/20 1334  WBC 5.2  --   NEUTROABS 2.6  --   HGB 10.6* 11.6*  HCT 34.9* 34.0*  MCV 91.1  --   PLT 314  --     Basic Metabolic Panel:  Lab Results  Component Value Date   NA 139 12/13/2020   K 4.6 12/13/2020   CO2 23 12/13/2020   GLUCOSE 63 (L) 12/13/2020   BUN 29 (H) 12/13/2020   CREATININE 1.80 (H) 12/13/2020   CALCIUM 8.8 (L) 12/13/2020   GFRNONAA 31 (L) 12/13/2020   GFRAA 64 (L) 07/21/2013   Lipid Panel:  Lab Results  Component Value Date   LDLCALC 51 07/03/2020   HgbA1c:  Lab Results  Component Value Date   HGBA1C 6.9 (H) 07/03/2020   Urine Drug Screen: No results found for: LABOPIA, COCAINSCRNUR, LABBENZ, AMPHETMU, THCU, LABBARB  Alcohol  Level No results found for: [facility]  CT Head without contrast: No acute intracranial process.    MRI Brain Ordered  MR Angio Head & Neck ordered    Impression   Oda Placke is a 75 y.o. female with PMH significant for HTN, HLD, Hiatal hernia, GERD. Her neurologic examination is notable for weakness in her Left Arm and Leg while having dulled sensation in her Right Arm, leg, and side of face. Given her CBG was 60 on arrival to ED with improvement in her symptoms after eating and her blood glucose at home being unknown it is possible she was hypoglycemic and this caused her symptoms. It is possible she has had a small stroke, MRI has been ordered. Will admit for stroke workup.  Recommendations  -Admit for Stroke Workup -MR Brain W WO Contrast ordered -MR Angio Head & Neck WO Contrast ordered -A1C and Lipid Panel ordered -2D Echo ordered ______________________________________________________________________   Fatima Sanger MD Resident

## 2020-12-13 NOTE — ED Notes (Signed)
Dr Kai Levins at bedside

## 2020-12-13 NOTE — H&P (Signed)
History and Physical  Cindy Larson MGQ:[id number] DOB: 06-08-1946 DOA: 12/13/2020  Referring physician: Eustaquio Maize, PA-C PCP: Biagio Borg, MD  Patient coming from: home  Chief Complaint: Left-sided numbness and tingling  HPI: Cindy Larson is a 75 y.o. female with medical history significant for hypertension, hyperlipidemia, GERD, hiatal hernia who presents to the emergency department due to left-sided numbness/tingling noted this morning.  She went to bed around 12:30 AM this morning and when she woke up around 6 AM she noticed numbness of left side of her face and lips.  Patient went back to sleep and when she woke up around 10:30 AM, she got up to use the restroom and noted that she was stumbling, she then noted numbness of her left leg with no facial droop or arm drift. She called her daughter who noticed a change in patient's speech and advised her to go to ED for further evaluation. Shortness of breath on exertion about 3 days ago while patient was gardening was mentioned by daughter at bedside.  Mild epigastric/substernal chest pain was reported and patient took 4 baby aspirin this morning for the pain without much relief per ED medical record.  She denies any shortness of breath and chest pain at bedside  ED Course:  In the emergency department, initial temperature was 97.48F but improved to 11F.  She was hemodynamically stable.  Work-up in the ED showed normocytic anemia, BUN to creatinine 23/1.70 (baseline creatinine 1.5-1.7).  Troponin x1 and BMP were negative.  She was also hypoglycemic with blood glucose of 63 on arrival to the ED, this improved after having some orange juice and [place] sandwich.  SARS coronavirus 2 was negative. CT of head without contrast showed no acute intracranial process Chest x-ray showed no active disease  MRI of brain without contrast showed no acute intracranial abnormality, normal intracranial MRA. Limited time-of-flight MRA of the  neck is normal. Plavix and Tylenol were given.  Neurologist was consulted and recommended dual antiplatelet with aspirin and Plavix therapy for 3 weeks followed by monotherapy.  Hospitalist was asked to admit patient for further evaluation and management.  Review of Systems: Constitutional: Negative for chills and fever.  HENT: Negative for ear pain and sore throat.   Eyes: Negative for pain and visual disturbance.  Respiratory: Negative for cough, chest tightness and shortness of breath.   Cardiovascular: Negative for chest pain and palpitations.  Gastrointestinal: Negative for abdominal pain and vomiting.  Endocrine: Negative for polyphagia and polyuria.  Genitourinary: Negative for decreased urine volume, dysuria, enuresis, hematuria Musculoskeletal: Positive for mild right lateral mid back pain.  Negative for arthralgias  Skin: Negative for color change and rash.  Allergic/Immunologic: Negative for immunocompromised state.  Neurological: Negative for tremors, syncope, speech difficulty, weakness, light-headedness and headaches.  Hematological: Does not bruise/bleed easily.  All other systems reviewed and are negative   Past Medical History:  Diagnosis Date  . ALLERGIC RHINITIS   . ANXIETY   . COLONIC POLYPS, HX OF   . DEPRESSION   . DIABETES MELLITUS, TYPE II   . Diverticulitis   . DIVERTICULOSIS, COLON   . Esophageal stricture   . Gallstones   . GERD   . HIATAL HERNIA   . History of blood transfusion    left knee replacement  . HYPERLIPIDEMIA   . Hypersomnolence   . HYPERTENSION   . INTERMITTENT VERTIGO   . OBESITY   . OSTEOARTHRITIS    L knee  . OSTEOPENIA   .  Pneumonia   . Post-menopausal    hormone replacement therapy   Past Surgical History:  Procedure Laterality Date  . ABDOMINAL HYSTERECTOMY    . CESAREAN SECTION    . CHOLECYSTECTOMY    . COLONOSCOPY  03/02/2017   [name]  . Lysis of Adhesions    . OOPHORECTOMY     one  . POLYPECTOMY    . ROTATOR  CUFF REPAIR Left   . TOTAL KNEE ARTHROPLASTY Left     Social History:  reports that she has never smoked. She has never used smokeless tobacco. She reports that she does not drink alcohol and does not use drugs.   Allergies  Allergen Reactions  . Aspirin Nausea Only and Other (See Comments)    Made the stomach "hurt"  . Fexofenadine Other (See Comments)    "This makes me feel badly. I didn't feel well when I took it"  . Nsaids Nausea Only and Other (See Comments)    The patient CAN tolerate Tramadol and Tylenol- no Aleve or Motrin, though  . Penicillins Hives and Other (See Comments)    Blisters, also  . Prevnar [Pneumococcal 13-Val Conj Vacc] Swelling and Other (See Comments)    Left arm became swollen and welts appeared  . Zocor [Simvastatin] Itching    Family History  Problem Relation Age of Onset  . Hypertension Mother   . Atrial fibrillation Mother   . Heart disease Mother        CHF  . Cancer Mother        Cancer in "thigh"  . Colon cancer Paternal Aunt        originated as breast CA  . Breast cancer Paternal Aunt   . Lung cancer Brother        smoker  . Diabetes Other        paternal Aunt and uncle  . Breast cancer Maternal Grandmother   . Ovarian cancer Sister   . Heart failure Brother        CHF  . Kidney disease Maternal Aunt   . Esophageal cancer Neg Hx   . Rectal cancer Neg Hx   . Stomach cancer Neg Hx   . Colon polyps Neg Hx      Prior to Admission medications   Medication Sig Start Date End Date Taking? Authorizing Provider  acetaminophen (TYLENOL) 500 MG tablet Take 500-1,000 mg by mouth every 8 (eight) hours as needed (for pain).   Yes [provider]  ALPRAZolam (XANAX) 0.25 MG tablet Take 1 tablet by mouth twice daily as needed for anxiety Patient taking differently: Take 0.25 mg by mouth 2 (two) times daily as needed for anxiety. 07/18/20  Yes Biagio Borg, MD  aspirin EC 81 MG tablet Take 1 tablet (81 mg total) by mouth daily. 04/18/15   Yes Biagio Borg, MD  atorvastatin (LIPITOR) 20 MG tablet Take 1 tablet by mouth once daily Patient taking differently: Take 20 mg by mouth at bedtime. 09/11/20  Yes Biagio Borg, MD  Cholecalciferol (VITAMIN D3) 50 MCG (2000 UT) TABS Take 2,000 Units by mouth daily.   Yes [provider]  citalopram (CELEXA) 10 MG tablet Take 1 tablet (10 mg total) by mouth daily. 02/16/20  Yes Biagio Borg, MD  glimepiride (AMARYL) 4 MG tablet Take 1 tablet by mouth twice daily Patient taking differently: Take 4 mg by mouth 2 (two) times daily with a meal. 12/02/20  Yes Biagio Borg, MD  guaiFENesin [facility]) 600  MG 12 hr tablet Take 2 tablets (1,200 mg total) by mouth 2 (two) times daily as needed. Patient taking differently: Take 1,200 mg by mouth 2 (two) times daily as needed for to loosen phlegm. 02/16/20  Yes Biagio Borg, MD  lisinopril (ZESTRIL) 5 MG tablet Take 1 tablet by mouth once daily Patient taking differently: Take 5 mg by mouth in the morning. 12/02/20  Yes Biagio Borg, MD  meclizine (ANTIVERT) 25 MG tablet TAKE ONE TABLET BY MOUTH EVERY 6 HOURS AS NEEDED FOR DIZZINESS Patient taking differently: Take 25 mg by mouth every 6 (six) hours as needed for dizziness. 10/31/18  Yes Biagio Borg, MD  metFORMIN (GLUCOPHAGE) 1000 MG tablet Take 1 tablet (1,000 mg total) by mouth 2 (two) times daily with a meal. 02/16/20  Yes Biagio Borg, MD  Multiple Vitamins-Minerals (ALIVE WOMENS GUMMY) CHEW Chew 1 tablet by mouth daily.   Yes [provider]  omeprazole (PRILOSEC) 20 MG capsule TAKE 1 CAPSULE BY MOUTH TWICE DAILY BEFORE MEAL(S) Patient taking differently: Take 40 mg by mouth in the morning. 07/29/20  Yes Biagio Borg, MD  Polyethyl Glycol-Propyl Glycol (SYSTANE OP) Place 1 drop into both eyes in the morning and at bedtime.   Yes [provider]  promethazine (PHENERGAN) 25 MG tablet TAKE ONE TABLET BY MOUTH EVERY 6 HOURS AS NEEDED FOR NAUSEA Patient taking differently: Take  25 mg by mouth every 6 (six) hours as needed for nausea. 11/19/16  Yes Biagio Borg, MD  traMADol (ULTRAM) 50 MG tablet Take 1 tablet (50 mg total) by mouth every 6 (six) hours as needed. Patient taking differently: Take 50 mg by mouth every 6 (six) hours as needed (for pain). 02/16/20  Yes Biagio Borg, MD  triamcinolone (NASACORT) 55 MCG/ACT AERO nasal inhaler Place 2 sprays into the nose daily. Patient taking differently: Place 2 sprays into the nose daily as needed (for allergies or sinus issues). 02/16/20  Yes Biagio Borg, MD  Continuous Blood Gluc Receiver (FREESTYLE LIBRE READER) DEVI Use as directed once daily E11.9 Patient not taking: No sig reported 02/16/20   Biagio Borg, MD  Continuous Blood Gluc Sensor ([place]) MISC Use as directed daily, change every 14 days E11.9 Patient not taking: Reported on 12/13/2020 02/16/20   Biagio Borg, MD  fexofenadine (ALLEGRA) 180 MG tablet Take 1 tablet (180 mg total) by mouth daily. Patient not taking: Reported on 12/13/2020 02/16/20 02/15/21  Biagio Borg, MD  PARoxetine (PAXIL) 20 MG tablet Take 1 tablet (20 mg total) by mouth daily. Patient not taking: Reported on 12/13/2020 04/12/19   Biagio Borg, MD    Physical Exam: BP 113/61   Pulse 82   Temp 98 F (36.7 C) (Oral)   Resp 15   Ht _0  (1.499 m)   Wt 74.8 kg   SpO2 100%   BMI 33.33 kg/m   . General: 75 y.o. year-old female well developed well nourished in no acute distress.  Alert and oriented x3. Marland Kitchen HEENT: NCAT, EOMI . Neck: Supple, trachea medial . Cardiovascular: Regular rate and rhythm with no rubs or gallops.  No thyromegaly or JVD noted.  2/4 pulses in all 4 extremities. Marland Kitchen Respiratory: Clear to auscultation with no wheezes or rales. Good inspiratory effort. . Abdomen: Soft nontender nondistended with normal bowel sounds x4 quadrants. . Muskuloskeletal: LLE swelling > RLE. No cyanosis, clubbing or edema noted bilaterally . Neuro: CN II-XII intact,  strength 5/5 x 4, sensation, reflexes intact . Skin: No ulcerative lesions noted or rashes . Psychiatry: Judgement and insight appear normal. Mood is appropriate for condition and setting          Labs on Admission:  Basic Metabolic Panel: Recent Labs  Lab 12/13/20 1250 12/13/20 1334  NA 138 139  K 4.6 4.6  CL 106 105  CO2 23  --   GLUCOSE 67* 63*  BUN 23 29*  CREATININE 1.70* 1.80*  CALCIUM 8.8*  --    Liver Function Tests: Recent Labs  Lab 12/13/20 1250  AST 21  ALT 15  ALKPHOS 54  BILITOT 0.5  PROT 6.5  ALBUMIN 3.8   No results for input(s): LIPASE, AMYLASE in the last 168 hours. No results for input(s): AMMONIA in the last 168 hours. CBC: Recent Labs  Lab 12/13/20 1250 12/13/20 1334  WBC 5.2  --   NEUTROABS 2.6  --   HGB 10.6* 11.6*  HCT 34.9* 34.0*  MCV 91.1  --   PLT 314  --    Cardiac Enzymes: No results for input(s): CKTOTAL, CKMB, CKMBINDEX, TROPONINI in the last 168 hours.  BNP (last 3 results) Recent Labs    12/13/20 1250  BNP 24.6    ProBNP (last 3 results) No results for input(s): PROBNP in the last 8760 hours.  CBG: Recent Labs  Lab 12/13/20 1322 12/13/20 1340 12/13/20 1435 12/13/20 1743  GLUCAP 60* 63* 77 149*    Radiological Exams on Admission: CT HEAD WO CONTRAST  Result Date: 12/13/2020 CLINICAL DATA:  Left-sided facial numbness, difficulty ambulating, left lower extremity numbness EXAM: CT HEAD WITHOUT CONTRAST TECHNIQUE: Contiguous axial images were obtained from the base of the skull through the vertex without intravenous contrast. COMPARISON:  02/22/2012 FINDINGS: Brain: No acute infarct or hemorrhage. Hypodensities in the bilateral frontal periventricular white matter consistent with chronic small vessel ischemic changes, stable. Lateral ventricles and remaining midline structures are unremarkable. No acute extra-axial fluid collections. No mass effect. Vascular: No hyperdense vessel or unexpected calcification. Skull:  Normal. Negative for fracture or focal lesion. Sinuses/Orbits: No acute finding. Other: None. IMPRESSION: 1. No acute intracranial process. Electronically Signed   By: Randa Ngo M.D.   On: 12/13/2020 15:14   MR ANGIO HEAD WO CONTRAST  Result Date: 12/13/2020 CLINICAL DATA:  Left-sided numbness EXAM: MRI HEAD WITHOUT CONTRAST MRA HEAD WITHOUT CONTRAST MRA NECK WITHOUT CONTRAST TECHNIQUE: Multiplanar, multiecho pulse sequences of the brain and surrounding structures were obtained without intravenous contrast. Angiographic images of the Circle of Willis were obtained using MRA technique without intravenous contrast. Angiographic images of the neck were obtained using MRA technique without intravenous contrast. Carotid stenosis measurements (when applicable) are obtained utilizing NASCET criteria, using the distal internal carotid diameter as the denominator. COMPARISON:  None. FINDINGS: MRI HEAD FINDINGS Brain: No acute infarct, mass effect or extra-axial collection. No acute or chronic hemorrhage. Single focus of white matter hyperintensity. Normal ventricles and extra-axial CSF spaces. The midline structures are normal. Vascular: Major flow voids are preserved. Skull and upper cervical spine: Normal calvarium and skull base. Visualized upper cervical spine and soft tissues are normal. Sinuses/Orbits:No paranasal sinus fluid levels or advanced mucosal thickening. No mastoid or middle ear effusion. Normal orbits. MRA HEAD FINDINGS POSTERIOR CIRCULATION: --Vertebral arteries: Normal --Inferior cerebellar arteries: Normal. --Basilar artery: Normal. --Superior cerebellar arteries: Normal. --Posterior cerebral arteries: Normal. ANTERIOR CIRCULATION: --Intracranial internal carotid arteries: Normal. --Anterior cerebral arteries (ACA): Normal. --Middle cerebral arteries (MCA): Normal. ANATOMIC VARIANTS: None  MRA NECK FINDINGS Limited time-of-flight imaging of the cervical carotid and vertebral arteries is normal.  IMPRESSION: 1. No acute intracranial abnormality. 2. Normal intracranial MRA. 3. Limited time-of-flight MRA of the neck is normal. Electronically Signed   By: Ulyses Jarred M.D.   On: 12/13/2020 21:14   MR ANGIO NECK WO CONTRAST  Result Date: 12/13/2020 CLINICAL DATA:  Left-sided numbness EXAM: MRI HEAD WITHOUT CONTRAST MRA HEAD WITHOUT CONTRAST MRA NECK WITHOUT CONTRAST TECHNIQUE: Multiplanar, multiecho pulse sequences of the brain and surrounding structures were obtained without intravenous contrast. Angiographic images of the Circle of Willis were obtained using MRA technique without intravenous contrast. Angiographic images of the neck were obtained using MRA technique without intravenous contrast. Carotid stenosis measurements (when applicable) are obtained utilizing NASCET criteria, using the distal internal carotid diameter as the denominator. COMPARISON:  None. FINDINGS: MRI HEAD FINDINGS Brain: No acute infarct, mass effect or extra-axial collection. No acute or chronic hemorrhage. Single focus of white matter hyperintensity. Normal ventricles and extra-axial CSF spaces. The midline structures are normal. Vascular: Major flow voids are preserved. Skull and upper cervical spine: Normal calvarium and skull base. Visualized upper cervical spine and soft tissues are normal. Sinuses/Orbits:No paranasal sinus fluid levels or advanced mucosal thickening. No mastoid or middle ear effusion. Normal orbits. MRA HEAD FINDINGS POSTERIOR CIRCULATION: --Vertebral arteries: Normal --Inferior cerebellar arteries: Normal. --Basilar artery: Normal. --Superior cerebellar arteries: Normal. --Posterior cerebral arteries: Normal. ANTERIOR CIRCULATION: --Intracranial internal carotid arteries: Normal. --Anterior cerebral arteries (ACA): Normal. --Middle cerebral arteries (MCA): Normal. ANATOMIC VARIANTS: None MRA NECK FINDINGS Limited time-of-flight imaging of the cervical carotid and vertebral arteries is normal. IMPRESSION:  1. No acute intracranial abnormality. 2. Normal intracranial MRA. 3. Limited time-of-flight MRA of the neck is normal. Electronically Signed   By: Ulyses Jarred M.D.   On: 12/13/2020 21:14   MR BRAIN WO CONTRAST  Result Date: 12/13/2020 CLINICAL DATA:  Left-sided numbness EXAM: MRI HEAD WITHOUT CONTRAST MRA HEAD WITHOUT CONTRAST MRA NECK WITHOUT CONTRAST TECHNIQUE: Multiplanar, multiecho pulse sequences of the brain and surrounding structures were obtained without intravenous contrast. Angiographic images of the Circle of Willis were obtained using MRA technique without intravenous contrast. Angiographic images of the neck were obtained using MRA technique without intravenous contrast. Carotid stenosis measurements (when applicable) are obtained utilizing NASCET criteria, using the distal internal carotid diameter as the denominator. COMPARISON:  None. FINDINGS: MRI HEAD FINDINGS Brain: No acute infarct, mass effect or extra-axial collection. No acute or chronic hemorrhage. Single focus of white matter hyperintensity. Normal ventricles and extra-axial CSF spaces. The midline structures are normal. Vascular: Major flow voids are preserved. Skull and upper cervical spine: Normal calvarium and skull base. Visualized upper cervical spine and soft tissues are normal. Sinuses/Orbits:No paranasal sinus fluid levels or advanced mucosal thickening. No mastoid or middle ear effusion. Normal orbits. MRA HEAD FINDINGS POSTERIOR CIRCULATION: --Vertebral arteries: Normal --Inferior cerebellar arteries: Normal. --Basilar artery: Normal. --Superior cerebellar arteries: Normal. --Posterior cerebral arteries: Normal. ANTERIOR CIRCULATION: --Intracranial internal carotid arteries: Normal. --Anterior cerebral arteries (ACA): Normal. --Middle cerebral arteries (MCA): Normal. ANATOMIC VARIANTS: None MRA NECK FINDINGS Limited time-of-flight imaging of the cervical carotid and vertebral arteries is normal. IMPRESSION: 1. No acute  intracranial abnormality. 2. Normal intracranial MRA. 3. Limited time-of-flight MRA of the neck is normal. Electronically Signed   By: Ulyses Jarred M.D.   On: 12/13/2020 21:14   DG Chest Port 1 View  Result Date: 12/13/2020 CLINICAL DATA:  Short of breath.  Left-sided numbness. EXAM: PORTABLE CHEST 1  VIEW COMPARISON:  04/18/2015 FINDINGS: The heart size and mediastinal contours are within normal limits. Both lungs are clear. The visualized skeletal structures are unremarkable. IMPRESSION: No active disease. Electronically Signed   By: Franchot Gallo M.D.   On: 12/13/2020 13:26    EKG: I independently viewed the EKG done and my findings are as followed: Sinus arrhythmia with rate of 83 bpm  Assessment/Plan Present on Admission: . TIA (transient ischemic attack) . OBESITY . GERD . Essential hypertension . Hyperlipidemia  Active Problems:   Type 2 diabetes mellitus with hyperlipidemia (HCC)   Hyperlipidemia   OBESITY   Essential hypertension   GERD   TIA (transient ischemic attack)   Hypoglycemia   Left leg swelling   TIA vs acute CVA CT of head without contrast showed no acute intracranial process MRI of brain without contrast showed no acute intracranial abnormality, normal intracranial MRA  Patient will be admitted to telemetry unit  Echocardiogram in the morning Continue aspirin and Plavix for 3 weeks therapy per neurology recommendation Continue fall precautions and neuro checks Lipid panel and hemoglobin A1c will be checked Continue PT/SLP/OT eval and treat Bedside swallow eval by nursing prior to diet Neurology was consulted and following per ED physician  Hypoglycemia-resolved Continue to monitor blood glucose level  Left leg swelling Left lower extremity ultrasound will be done to rule out DVT  CKD stage IIIb BUN to creatinine 23/1.70 (baseline creatinine 1.5-1.7), eGFR 31  Questionable chest pain Patient denies chest pain, but endorsed right lateral mid back  pain Troponin x1 and BMP were negative Continue Tylenol as needed  Essential hypertension Permissive hypertension will be allowed at this time  Hyperlipidemia Continue Lipitor  GERD Continue Protonix  T2DM Continue ISS and hypoglycemia protocol Glimepiride will be held at this time  Depression Continue Celexa  ObesityBMI (33.33) Patient counseled on diet and lifestyle modification  DVT prophylaxis: SCDs  Code Status: Full code  Family Communication: Daughter at bedside (all questions answered to satisfaction)  Disposition Plan:  Patient is from:                        home Anticipated DC to:                   home Anticipated DC date:               1 day Anticipated DC barriers:           Patient requires inpatient management for further stroke work-up    Consults called: Neurology (by ED team)  Admission status: Observation    Bernadette Hoit MD [facility]  12/14/2020, 12:25 AM

## 2020-12-13 NOTE — ED Notes (Addendum)
Pt given orange juice for low CBG, will recheck in 15 minutes. Margaux PA aware.

## 2020-12-13 NOTE — Progress Notes (Signed)
Attempted to call to receive report x1.

## 2020-12-14 ENCOUNTER — Observation Stay (HOSPITAL_BASED_OUTPATIENT_CLINIC_OR_DEPARTMENT_OTHER): Payer: Medicare Other

## 2020-12-14 ENCOUNTER — Encounter (HOSPITAL_COMMUNITY): Payer: Self-pay | Admitting: Internal Medicine

## 2020-12-14 ENCOUNTER — Other Ambulatory Visit: Payer: Self-pay

## 2020-12-14 DIAGNOSIS — E162 Hypoglycemia, unspecified: Secondary | ICD-10-CM | POA: Diagnosis not present

## 2020-12-14 DIAGNOSIS — M7989 Other specified soft tissue disorders: Secondary | ICD-10-CM | POA: Diagnosis not present

## 2020-12-14 DIAGNOSIS — G459 Transient cerebral ischemic attack, unspecified: Secondary | ICD-10-CM | POA: Diagnosis not present

## 2020-12-14 DIAGNOSIS — I639 Cerebral infarction, unspecified: Secondary | ICD-10-CM | POA: Diagnosis not present

## 2020-12-14 DIAGNOSIS — E1169 Type 2 diabetes mellitus with other specified complication: Secondary | ICD-10-CM | POA: Diagnosis not present

## 2020-12-14 DIAGNOSIS — I10 Essential (primary) hypertension: Secondary | ICD-10-CM | POA: Diagnosis not present

## 2020-12-14 DIAGNOSIS — E785 Hyperlipidemia, unspecified: Secondary | ICD-10-CM | POA: Diagnosis not present

## 2020-12-14 LAB — ECHOCARDIOGRAM COMPLETE
AR max vel: 1.75 cm2
AV Area VTI: 1.79 cm2
AV Area mean vel: 1.74 cm2
AV Mean grad: 4 mmHg
AV Peak grad: 7.2 mmHg
Ao pk vel: 1.34 m/s
Area-P 1/2: 3.83 cm2
Calc EF: 42.9 %
Height: 59 in
MV M vel: 1.21 m/s
MV Peak grad: 5.9 mmHg
S' Lateral: 3.4 cm
Single Plane A2C EF: 37.5 %
Single Plane A4C EF: 51.3 %
Weight: 2640 oz

## 2020-12-14 LAB — LIPID PANEL
Cholesterol: 115 mg/dL (ref 0–200)
HDL: 44 mg/dL (ref >40)
LDL Cholesterol: 54 mg/dL (ref 0–99)
Total CHOL/HDL Ratio: 2.6 RATIO
Triglycerides: 84 mg/dL (ref <150)
VLDL: 17 mg/dL (ref 0–40)

## 2020-12-14 LAB — COMPREHENSIVE METABOLIC PANEL
ALT: 14 U/L (ref 0–44)
AST: 15 U/L (ref 15–41)
Albumin: 3.4 g/dL — ABNORMAL LOW (ref 3.5–5.0)
Alkaline Phosphatase: 46 U/L (ref 38–126)
Anion gap: 7 (ref 5–15)
BUN: 22 mg/dL (ref 8–23)
CO2: 26 mmol/L (ref 22–32)
Calcium: 9 mg/dL (ref 8.9–10.3)
Chloride: 106 mmol/L (ref 98–111)
Creatinine, Ser: 1.56 mg/dL — ABNORMAL HIGH (ref 0.44–1.00)
GFR, Estimated: 35 mL/min — ABNORMAL LOW (ref >60)
Glucose, Bld: 148 mg/dL — ABNORMAL HIGH (ref 70–99)
Potassium: 5.4 mmol/L — ABNORMAL HIGH (ref 3.5–5.1)
Sodium: 139 mmol/L (ref 135–145)
Total Bilirubin: 0.5 mg/dL (ref 0.3–1.2)
Total Protein: 5.9 g/dL — ABNORMAL LOW (ref 6.5–8.1)

## 2020-12-14 LAB — APTT: aPTT: 31 seconds (ref 24–36)

## 2020-12-14 LAB — PHOSPHORUS: Phosphorus: 2.5 mg/dL (ref 2.5–4.6)

## 2020-12-14 LAB — CBC
HCT: 30.9 % — ABNORMAL LOW (ref 36.0–46.0)
Hemoglobin: 9.6 g/dL — ABNORMAL LOW (ref 12.0–15.0)
MCH: 27.5 pg (ref 26.0–34.0)
MCHC: 31.1 g/dL (ref 30.0–36.0)
MCV: 88.5 fL (ref 80.0–100.0)
Platelets: 264 10*3/uL (ref 150–400)
RBC: 3.49 MIL/uL — ABNORMAL LOW (ref 3.87–5.11)
RDW: 14 % (ref 11.5–15.5)
WBC: 4.8 10*3/uL (ref 4.0–10.5)
nRBC: 0 % (ref 0.0–0.2)

## 2020-12-14 LAB — GLUCOSE, CAPILLARY
Glucose-Capillary: 184 mg/dL — ABNORMAL HIGH (ref 70–99)
Glucose-Capillary: 90 mg/dL (ref 70–99)
Glucose-Capillary: 259 mg/dL — ABNORMAL HIGH (ref 70–99)

## 2020-12-14 LAB — MAGNESIUM: Magnesium: 1.3 mg/dL — ABNORMAL LOW (ref 1.7–2.4)

## 2020-12-14 LAB — PROTIME-INR
INR: 0.9 (ref 0.8–1.2)
Prothrombin Time: 12 seconds (ref 11.4–15.2)

## 2020-12-14 MED ORDER — SODIUM CHLORIDE 0.9 % IV SOLN: Freq: Once | INTRAVENOUS | Status: AC

## 2020-12-14 MED ORDER — PANTOPRAZOLE SODIUM 40 MG PO TBEC
40.0000 mg | DELAYED_RELEASE_TABLET | Freq: Every day | ORAL | Status: DC
  Administered 2020-12-14: 40 mg via ORAL
  Filled 2020-12-14: qty 1

## 2020-12-14 MED ORDER — INSULIN ASPART 100 UNIT/ML ~~LOC~~ SOLN: 0.0000 [IU] | Freq: Every day | SUBCUTANEOUS | Status: DC

## 2020-12-14 MED ORDER — VITAMIN D 25 MCG (1000 UNIT) PO TABS
2000.0000 [IU] | ORAL_TABLET | Freq: Every day | ORAL | Status: DC
  Administered 2020-12-14: 2000 [IU] via ORAL
  Filled 2020-12-14: qty 2

## 2020-12-14 MED ORDER — INSULIN ASPART 100 UNIT/ML ~~LOC~~ SOLN
0.0000 [IU] | Freq: Three times a day (TID) | SUBCUTANEOUS | Status: DC
  Administered 2020-12-14: 3 [IU] via SUBCUTANEOUS

## 2020-12-14 MED ORDER — ACCU-CHEK SMARTVIEW VI STRP: ORAL_STRIP | 12 refills | Status: DC

## 2020-12-14 MED ORDER — ACETAMINOPHEN 325 MG PO TABS
650.0000 mg | ORAL_TABLET | Freq: Four times a day (QID) | ORAL | Status: DC | PRN
  Administered 2020-12-14: 650 mg via ORAL
  Filled 2020-12-14: qty 2

## 2020-12-14 MED ORDER — TRAMADOL HCL 50 MG PO TABS: 50.0000 mg | ORAL_TABLET | Freq: Two times a day (BID) | ORAL | 0 refills | Status: DC | PRN

## 2020-12-14 MED ORDER — ACCU-CHEK NANO SMARTVIEW W/DEVICE KIT: 1.0000 [IU] | PACK | 0 refills | Status: DC

## 2020-12-14 MED ORDER — CITALOPRAM HYDROBROMIDE 10 MG PO TABS
10.0000 mg | ORAL_TABLET | Freq: Every day | ORAL | Status: DC
  Administered 2020-12-14: 10 mg via ORAL
  Filled 2020-12-14: qty 1

## 2020-12-14 MED ORDER — CLOPIDOGREL BISULFATE 75 MG PO TABS: 75.0000 mg | ORAL_TABLET | Freq: Every day | ORAL | 1 refills | Status: DC

## 2020-12-14 MED ORDER — MAGNESIUM SULFATE 2 GM/50ML IV SOLN
2.0000 g | Freq: Once | INTRAVENOUS | Status: AC
  Administered 2020-12-14: 2 g via INTRAVENOUS
  Filled 2020-12-14: qty 50

## 2020-12-14 NOTE — Discharge Instructions (Signed)
Stroke Prevention Some medical conditions and lifestyle choices can lead to a higher risk for a stroke. You can help to prevent a stroke by eating healthy foods and exercising. It also helps to not smoke and to manage any health problems you may have. How can this condition affect me? A stroke is an emergency. It should be treated right away. A stroke can lead to brain damage or threaten your life. There is a better chance of surviving and getting better after a stroke if you get medical help right away. What can increase my risk? The following medical conditions may increase your risk of a stroke:  Diseases of the heart and blood vessels (cardiovascular disease).  High blood pressure (hypertension).  Diabetes.  High cholesterol.  Sickle cell disease.  Problems with blood clotting.  Being very overweight.  Sleeping problems (obstructivesleep apnea). Other risk factors include:  Being older than age 29.  A history of blood clots, stroke, or mini-stroke (TIA).  Race, ethnic background, or a family history of stroke.  Smoking or using tobacco products.  Taking birth control pills, especially if you smoke.  Heavy alcohol and drug use.  Not being active. What actions can I take to prevent this? Manage your health conditions  High cholesterol. ? Eat a healthy diet. If this is not enough to manage your cholesterol, you may need to take medicines. ? Take medicines as told by your doctor.  High blood pressure. ? Try to keep your blood pressure below 130/80. ? If your blood pressure cannot be managed through a healthy diet and regular exercise, you may need to take medicines. ? Take medicines as told by your doctor. ? Ask your doctor if you should check your blood pressure at home. ? Have your blood pressure checked every year.  Diabetes. ? Eat a healthy diet and get regular exercise. If your blood sugar (glucose) cannot be managed through diet and exercise, you may need to  take medicines. ? Take medicines as told by your doctor.  Talk to your doctor about getting checked for sleeping problems. Signs of a problem can include: ? Snoring a lot. ? Feeling very tired.  Make sure that you manage any other conditions you have. Nutrition  Follow instructions from your doctor about what to eat or drink. You may be told to: ? Eat and drink fewer calories each day. ? Limit how much salt (sodium) you use to 1,500 milligrams (mg) each day. ? Use only healthy fats for cooking, such as olive oil, canola oil, and sunflower oil. ? Eat healthy foods. To do this:  Choose foods that are high in fiber. These include whole grains, and fresh fruits and vegetables.  Eat at least 5 servings of fruits and vegetables a day. Try to fill one-half of your plate with fruits and vegetables at each meal.  Choose low-fat (lean) proteins. These include low-fat cuts of meat, chicken without skin, fish, tofu, beans, and nuts.  Eat low-fat dairy products. ? Avoid foods that:  Are high in salt.  Have saturated fat.  Have trans fat.  Have cholesterol.  Are processed or pre-made. ? Count how many carbohydrates you eat and drink each day.   Lifestyle  If you drink alcohol: ? Limit how much you have to:  0-1 drink a day for women who are not pregnant.  0-2 drinks a day for men. ? Know how much alcohol is in your drink. In the [place] one drink equals one 12 oz bottle  of beer (346m), one 5 oz glass of wine (1436m, or one 1 oz glass of hard liquor (4437m  Do not smoke or use any products that have nicotine or tobacco. If you need help quitting, ask your doctor.  Avoid secondhand smoke.  Do not use drugs. Activity  Try to stay at a healthy weight.  Get at least 30 minutes of exercise on most days, such as: ? Fast walking. ? Biking. ? Swimming.   Medicines  Take over-the-counter and prescription medicines only as told by your doctor.  Avoid taking birth control pills.  Talk to your doctor about the risks of taking birth control pills if: ? You are over 35 65ars old. ? You smoke. ? You get very bad headaches. ? You have had a blood clot. Where to find more information  American Stroke Association: www.strokeassociation.org Get help right away if:  You or a loved one has any signs of a stroke. "BE FAST" is an easy way to remember the warning signs: ? B - Balance. Dizziness, sudden trouble walking, or loss of balance. ? E - Eyes. Trouble seeing or a change in how you see. ? F - Face. Sudden weakness or loss of feeling of the face. The face or eyelid may droop on one side. ? A - Arms. Weakness or loss of feeling in an arm. This happens all of a sudden and most often on one side of the body. ? S - Speech. Sudden trouble speaking, slurred speech, or trouble understanding what people say. ? T - Time. Time to call emergency services. Write down what time symptoms started.  You or a loved one has other signs of a stroke, such as: ? A sudden, very bad headache with no known cause. ? Feeling like you may vomit (nausea). ? Vomiting. ? A seizure. These symptoms may be an emergency. Get help right away. Call your local emergency services (911 in the [place]  Do not wait to see if the symptoms will go away.  Do not drive yourself to the hospital. Summary  You can help to prevent a stroke by eating healthy, exercising, and not smoking. It also helps to manage any health problems you have.  Do not smoke or use any products that contain nicotine or tobacco.  Get help right away if you or a loved one has any signs of a stroke. This information is not intended to replace advice given to you by your health care provider. Make sure you discuss any questions you have with your health care provider. Document Revised: 03/18/2020 Document Reviewed: 03/18/2020 Elsevier Patient Education  2021 [employer] Hypoglycemia is when the sugar (glucose) level in  your blood is too low. Low blood sugar can happen to people who have diabetes and people who do not have diabetes. Low blood sugar can happen quickly, and it can be an emergency. What are the causes? This condition happens most often in people who have diabetes and may be caused by:  Diabetes medicine.  Not eating enough, or not eating often enough.  Doing more physical activity.  Drinking alcohol on an empty stomach. If you do not have diabetes, hypoglycemia may be caused by:  A tumor in the pancreas.  Not eating enough, or not eating for long periods at a time (fasting).  A very bad infection or illness.  Problems after having weight loss (bariatric) surgery.  Kidney failure or liver failure.  Certain medicines. What increases the risk? This condition is  more likely to develop in people who:  Have diabetes and take medicines to lower their blood sugar.  Abuse alcohol.  Have a very bad illness. What are the signs or symptoms? Symptoms depend on whether your low blood sugar is mild, moderate, or very low. Mild  Hunger.  Feeling worried or nervous (anxious).  Sweating and feeling clammy.  Feeling dizzy or light-headed.  Being sleepy or having trouble sleeping.  Feeling like you may vomit (nauseous).  A fast heartbeat.  A headache.  Blurry vision.  Being irritable or grouchy.  Tingling or loss of feeling (numbness) around your mouth, lips, or tongue.  Trouble with moving (coordination). Moderate  Confusion and poor judgment.  Behavior changes.  Weakness.  Uneven heartbeats. Very low Very low blood sugar (severe hypoglycemia) is a medical emergency. It can cause:  Fainting.  Jerky movements that you cannot control (seizure).  Loss of consciousness (coma).  Death. How is this treated? Treating low blood sugar Low blood sugar is often treated by eating or drinking something sugary right away. The snack should contain 15 grams of a fast-acting  carb (carbohydrate). Options include:  4 oz (120 mL) of fruit juice.  4-6 oz (120-150 mL) of regular soda (not diet soda).  8 oz (240 mL) of low-fat milk.  Several pieces of hard candy. Check food labels to find out how many to eat for 15 grams.  1 Tbsp (15 mL) of sugar or honey. Treating low blood sugar if you have diabetes If you can think clearly and swallow safely, follow the 15:15 rule:  Take 15 grams of a fast-acting carb. Talk with your doctor about how much you should take.  Always keep a source of fast-acting carb with you, such as: ? Sugar tablets (glucose pills). Take 4 pills. ? Several pieces of hard candy. Check food labels to see how many pieces to eat for 15 grams. ? 4 oz (120 mL) of fruit juice. ? 4-6 oz (120-150 mL) of regular (not diet) soda. ? 1 Tbsp (15 mL) of honey or sugar.  Check your blood sugar 15 minutes after you take the carb.  If your blood sugar is still at or below 70 mg/dL (3.9 mmol/L), take 15 grams of a carb again.  If your blood sugar does not go above 70 mg/dL (3.9 mmol/L) after 3 tries, get help right away.  After your blood sugar goes back to normal, eat a meal or a snack within 1 hour.   Treating very low blood sugar If your blood sugar is at or below 54 mg/dL (3 mmol/L), you have very low blood sugar, or severe hypoglycemia. This is an emergency. Get medical help right away. If you have very low blood sugar and you cannot eat or drink, you will need to be given a hormone called glucagon. A family member or friend should learn how to check your blood sugar and how to give you glucagon. Ask your doctor if you need to have an emergency glucagon kit at home. Very low blood sugar may also need to be treated in a hospital. Follow these instructions at home: General instructions  Take over-the-counter and prescription medicines only as told by your doctor.  Stay aware of your blood sugar as told by your doctor.  If you drink alcohol: ? Limit  how much you use to:  0-1 drink a day for nonpregnant women.  0-2 drinks a day for men. ? Be aware of how much alcohol is in your  drink. In the [place] one drink equals one 12 oz bottle of beer (355 mL), one 5 oz glass of wine (148 mL), or one 1 oz glass of hard liquor (44 mL).  Keep all follow-up visits as told by your doctor. This is important. If you have diabetes:  Always have a rapid-acting carb (15 grams) option with you to treat low blood sugar.  Follow your diabetes care plan as told by your doctor. Make sure you: ? Know the symptoms of low blood sugar. ? Check your blood sugar as often as told by your doctor. Always check it before and after exercise. ? Always check your blood sugar before you drive. ? Take your medicines as told. ? Follow your meal plan. ? Eat on time. Do not skip meals.  Share your diabetes care plan with: ? Your work or school. ? People you live with.  Carry a card or wear jewelry that says you have diabetes.   Contact a doctor if:  You have trouble keeping your blood sugar in your target range.  You have low blood sugar often. Get help right away if:  You still have symptoms after you eat or drink something that contains 15 grams of fast-acting carb and you cannot get your blood sugar above 70 mg/dL by following the 15:15 rule.  Your blood sugar is at or below 54 mg/dL (3 mmol/L).  You have a seizure.  You faint. These symptoms may be an emergency. Do not wait to see if the symptoms will go away. Get medical help right away. Call your local emergency services (911 in the [place] Do not drive yourself to the hospital. Summary  Hypoglycemia happens when the level of sugar (glucose) in your blood is too low.  Low blood sugar can happen to people who have diabetes and people who do not have diabetes. Low blood sugar can happen quickly, and it can be an emergency.  Make sure you know the symptoms of low blood sugar and know how to treat  it.  Always keep a source of sugar (fast-acting carb) with you to treat low blood sugar. This information is not intended to replace advice given to you by your health care provider. Make sure you discuss any questions you have with your health care provider. Document Revised: 07/12/2019 Document Reviewed: 07/12/2019 Elsevier Patient Education  2021 [employer].

## 2020-12-14 NOTE — Progress Notes (Addendum)
Neurology Progress Note   S:// Cindy Larson is sitting in the chair this am.  She is alert and oriented. She voices that she occasionally gets dizzy but states she is back to baseline.  During my visit she had a dizzy spell when turning her head. She denies ear pain but endorses sinus pressure. No family present this morning    O:// Current vital signs: BP 114/70 (BP Location: Right Arm)   Pulse 84   Temp 97.9 F (36.6 C) (Oral)   Resp 20   Ht 4\' 11"  (1.499 m)   Wt 74.8 kg   SpO2 99%   BMI 33.33 kg/m  Vital signs in last 24 hours: Temp:  [97.4 F (36.3 C)-98.1 F (36.7 C)] 97.9 F (36.6 C) (04/16 0700) Pulse Rate:  [54-87] 84 (04/16 0700) Resp:  [10-20] 20 (04/16 0700) BP: (99-139)/(50-99) 114/70 (04/16 0700) SpO2:  [98 %-100 %] 99 % (04/16 0700) Weight:  [74.8 kg] 74.8 kg (04/15 2305)   GENERAL: Awake, alert in NAD HEENT: - Normocephalic and atraumatic, dry mm LUNGS - Clear to auscultation bilaterally with no wheezes CV - S1S2 RRR, no m/r/g, equal pulses bilaterally. ABDOMEN - Soft, nontender, nondistended with normoactive BS Ext: warm, well perfused, intact peripheral pulses  NEURO:  Mental Status: AA&Ox3  Language: speech is clear.  Naming, repetition, fluency, and comprehension intact. Cranial Nerves: PERRL 20mm/brisk. EOMI, visual fields full, no facial asymmetry,facial sensation intact, hearing intact, tongue/uvula/soft palate midline, normal sternocleidomastoid and trapezius muscle strength. No evidence of tongue atrophy or fibrillations Motor: 5/5 in all extremities. Tone: is normal and bulk is normal Sensation- Intact to light touch bilaterally Coordination: FTN intact bilaterally, no ataxia in BLE. Gait- deferred  NIHSS 0  Medications  Current Facility-Administered Medications:  .  acetaminophen (TYLENOL) tablet 650 mg, 650 mg, Oral, Q6H PRN, Adefeso, Oladapo, DO, 650 mg at 12/14/20 0852 .  aspirin EC tablet 81 mg, 81 mg, Oral, Daily, Rosalin Hawking, MD, 81  mg at 12/14/20 0848 .  atorvastatin (LIPITOR) tablet 20 mg, 20 mg, Oral, Daily, Rosalin Hawking, MD, 20 mg at 12/14/20 0849 .  cholecalciferol (VITAMIN D3) tablet 2,000 Units, 2,000 Units, Oral, Daily, Adefeso, Oladapo, DO, 2,000 Units at 12/14/20 0849 .  citalopram (CELEXA) tablet 10 mg, 10 mg, Oral, Daily, Adefeso, Oladapo, DO, 10 mg at 12/14/20 0848 .  clopidogrel (PLAVIX) tablet 75 mg, 75 mg, Oral, Daily, Adefeso, Oladapo, DO .  insulin aspart (novoLOG) injection 0-5 Units, 0-5 Units, Subcutaneous, QHS, Adefeso, Oladapo, DO .  insulin aspart (novoLOG) injection 0-9 Units, 0-9 Units, Subcutaneous, TID WC, Adefeso, Oladapo, DO .  magnesium sulfate IVPB 2 g 50 mL, 2 g, Intravenous, Once, Donnamae Jude, MD, Last Rate: 50 mL/hr at 12/14/20 1206, 2 g at 12/14/20 1206 .  pantoprazole (PROTONIX) EC tablet 40 mg, 40 mg, Oral, Daily, Adefeso, Oladapo, DO, 40 mg at 12/14/20 0848 Labs CBC    Component Value Date/Time   WBC 4.8 12/14/2020 0211   RBC 3.49 (L) 12/14/2020 0211   HGB 9.6 (L) 12/14/2020 0211   HCT 30.9 (L) 12/14/2020 0211   PLT 264 12/14/2020 0211   MCV 88.5 12/14/2020 0211   MCH 27.5 12/14/2020 0211   MCHC 31.1 12/14/2020 0211   RDW 14.0 12/14/2020 0211   LYMPHSABS 2.1 12/13/2020 1250   MONOABS 0.4 12/13/2020 1250   EOSABS 0.1 12/13/2020 1250   BASOSABS 0.0 12/13/2020 1250    CMP     Component Value Date/Time   NA 139 12/14/2020 0211  K 5.4 (H) 12/14/2020 0211   CL 106 12/14/2020 0211   CO2 26 12/14/2020 0211   GLUCOSE 148 (H) 12/14/2020 0211   GLUCOSE 122 (H) 09/10/2006 1520   BUN 22 12/14/2020 0211   CREATININE 1.56 (H) 12/14/2020 0211   CALCIUM 9.0 12/14/2020 0211   PROT 5.9 (L) 12/14/2020 0211   ALBUMIN 3.4 (L) 12/14/2020 0211   AST 15 12/14/2020 0211   ALT 14 12/14/2020 0211   ALKPHOS 46 12/14/2020 0211   BILITOT 0.5 12/14/2020 0211   GFRNONAA 35 (L) 12/14/2020 0211   GFRAA 64 (L) 07/21/2013 1355    glycosylated hemoglobin  Lipid Panel     Component Value  Date/Time   CHOL 115 12/14/2020 0211   TRIG 84 12/14/2020 0211   TRIG 377 (HH) 09/10/2006 1520   HDL 44 12/14/2020 0211   CHOLHDL 2.6 12/14/2020 0211   VLDL 17 12/14/2020 0211   LDLCALC 54 12/14/2020 0211   LDLDIRECT 73.0 02/16/2020 1428     Imaging I have reviewed images in epic and the results pertinent to this consultation are:  CT-scan of the brain No acute process   MRI/ MRA examination of the brain No acute intracranial abnormality. Normal intracranial MRA.  Assessment:  Cindy Larson is a 75 y.o. female with PMH significant for  has a past medical history of ALLERGIC RHINITIS, ANXIETY, COLONIC POLYPS, HX OF, DEPRESSION, DIABETES MELLITUS, TYPE II, Diverticulitis, DIVERTICULOSIS, COLON, Esophageal stricture, Gallstones, GERD, HIATAL HERNIA, History of blood transfusion, HYPERLIPIDEMIA, Hypersomnolence, HYPERTENSION, INTERMITTENT VERTIGO, OBESITY, OSTEOARTHRITIS, OSTEOPENIA, Pneumonia, and Post-menopausal   Recommendations: - stroke workup negative - awaiting results of echo. - continue bASA/Plavix for 3 weeks and then monotherapy  - Neurology will signoff   Beulah Gandy, DNP, ACNPC-AG   ATTENDING NOTE: I reviewed above note and agree with the assessment and plan. Pt was seen and examined.   75 year old female with history of diabetes, hypertension, hyperlipidemia, obesity, vertigo admitted for episode of left lip and tongue numbness and left leg weakness as well as slurred speech.  Symptoms gradually improved and resolved in 2 hours.  CT no acute abnormality.  MRI no acute infarct, MRA head and neck unremarkable.  EF 50 to 55%.  LDL 54, A1c pending.  LE venous Doppler negative for DVT.  On exam, patient lying in bed, no family at bedside.  Patient awake alert, oriented x3, no aphasia follows simple commands.  Neurological examination no focal deficit.  Etiology for patient's symptoms could be TIA.  DDx also including migraine equivalent although patient denies  headache this time but patient did have " sinus headache" from time to time.  Recommend aspirin 81 and Plavix 75 for 3 weeks and then Plavix alone.  Continue Lipitor 20.  Stroke risk factor modification.  Neurology will sign off. Please call with questions. Pt will follow up with stroke clinic NP at [facility] in about 4 weeks. Thanks for the consult.   Rosalin Hawking, MD PhD Stroke Neurology 12/14/2020 6:40 PM

## 2020-12-14 NOTE — Progress Notes (Signed)
2D echocardiogram completed.  12/14/2020 11:29 AM Kelby Aline., MHA, RVT, RDCS, RDMS

## 2020-12-14 NOTE — Progress Notes (Signed)
Bilateral lower extremity venous duplex completed. Refer to "CV Proc" under chart review to view preliminary results.  12/14/2020 11:29 AM Kelby Aline., MHA, RVT, RDCS, RDMS

## 2020-12-14 NOTE — Evaluation (Signed)
Occupational Therapy Evaluation Patient Details Name: Cindy Larson MRN: [id number] DOB: 09/08/1945 Today's Date: 12/14/2020    History of Present Illness 75 y/o female presented to ED on 4/15 with complaint of L sided numbness/tingling. MRI and CT head negative for acute abnormalities. PMH: anxiety, depression, DM type II, GERD, HLD, HTN, osteopenia, intermittent vertigo.   Clinical Impression   PTA, pt was living at home with her husband, pt reports she was indpendent with ADL/IADL and functional mobility. Pt currently requires supervision for functional mobility and supervision for completion of ADL. Pt would benefit from follow-up PT services to address stability. No OT needs are identified at this time. Patient evaluated by Occupational Therapy with no further acute OT needs identified. All education has been completed and the patient has no further questions. See below for any follow-up Occupational Therapy or equipment needs. OT to sign off. Thank you for referral.      Follow Up Recommendations  No OT follow up    Equipment Recommendations  None recommended by OT    Recommendations for Other Services       Precautions / Restrictions Precautions Precautions: Fall Restrictions Weight Bearing Restrictions: No      Mobility Bed Mobility Overal bed mobility: Modified Independent             General bed mobility comments: pt sitting in recliner upon arrival    Transfers Overall transfer level: Modified independent                    Balance Overall balance assessment: Mild deficits observed, not formally tested                                         ADL either performed or assessed with clinical judgement   ADL Overall ADL's : Needs assistance/impaired                                     Functional mobility during ADLs: Supervision/safety General ADL Comments: Pt requires supervision for completion of  ADL/IADL and functional mobility without use of AD. Pt completed grooming at sink level while standing and demonstrated ability to complete all ADL with supervision for safety due to mild instability; Pt reports numbness on left side of face, educated her on desensitization strategies     Vision Baseline Vision/History: No visual deficits Patient Visual Report: No change from baseline Vision Assessment?: No apparent visual deficits     Perception     Praxis      Pertinent Vitals/Pain Pain Assessment: 0-10 Pain Score: 3  Pain Location: R side of back Pain Descriptors / Indicators: Discomfort Pain Intervention(s): Limited activity within patient's tolerance;Monitored during session     Hand Dominance Right   Extremity/Trunk Assessment Upper Extremity Assessment Upper Extremity Assessment: Overall WFL for tasks assessed   Lower Extremity Assessment Lower Extremity Assessment: Defer to PT evaluation   Cervical / Trunk Assessment Cervical / Trunk Assessment: Kyphotic   Communication Communication Communication: No difficulties   Cognition Arousal/Alertness: Awake/alert Behavior During Therapy: WFL for tasks assessed/performed Overall Cognitive Status: Within Functional Limits for tasks assessed  General Comments  vss; educated pt on BE FAST accronym for signs and symptoms of a stroke    Exercises     Shoulder Instructions      Home Living Family/patient expects to be discharged to:: Private residence Living Arrangements: Spouse/significant other Available Help at Discharge: Family;Available 24 hours/day Type of Home: House Home Access: Stairs to enter [employer] of Steps: 3 Entrance Stairs-Rails: Right;Left;Can reach both Home Layout: One level     Bathroom Shower/Tub: Teacher, early years/pre: Standard     Home Equipment: None          Prior Functioning/Environment Level of  Independence: Independent        Comments: drives, gardens        OT Problem List: Impaired balance (sitting and/or standing)      OT Treatment/Interventions:      OT Goals(Current goals can be found in the care plan section) Acute Rehab OT Goals Patient Stated Goal: to go home OT Goal Formulation: With patient Time For Goal Achievement: 12/21/20 Potential to Achieve Goals: Good  OT Frequency:     Barriers to D/C:            Co-evaluation              AM-PAC OT "6 Clicks" Daily Activity     Outcome Measure Help from another person eating meals?: None Help from another person taking care of personal grooming?: A Little Help from another person toileting, which includes using toliet, bedpan, or urinal?: A Little Help from another person bathing (including washing, rinsing, drying)?: A Little Help from another person to put on and taking off regular upper body clothing?: A Little Help from another person to put on and taking off regular lower body clothing?: A Little 6 Click Score: 19   End of Session Equipment Utilized During Treatment: Gait belt Nurse Communication: Mobility status  Activity Tolerance: Patient tolerated treatment well Patient left: in chair;with call bell/phone within reach;with chair alarm set  OT Visit Diagnosis: Other abnormalities of gait and mobility (R26.89)                Time: 9470-9628 OT Time Calculation (min): 22 min Charges:  OT General Charges $OT Visit: 1 Visit OT Evaluation $OT Eval Low Complexity: 1 Low  Kaamil Morefield OTR/L Acute Rehabilitation Services Office: [place] 12/14/2020, 10:28 AM

## 2020-12-14 NOTE — Plan of Care (Signed)
  Problem: Education: Goal: Knowledge of disease or condition will improve Outcome: Adequate for Discharge Goal: Knowledge of secondary prevention will improve Outcome: Adequate for Discharge Goal: Knowledge of patient specific risk factors addressed and post discharge goals established will improve Outcome: Adequate for Discharge Goal: Individualized Educational Video(s) Outcome: Adequate for Discharge   Problem: Coping: Goal: Will verbalize positive feelings about self Outcome: Adequate for Discharge Goal: Will identify appropriate support needs Outcome: Adequate for Discharge   Problem: Self-Care: Goal: Ability to participate in self-care as condition permits will improve Outcome: Adequate for Discharge    Discharge instructions given to patient and her daughter, instructed patient to follow up with neurology as ordered and primary. Patient and daughter both verbalized discharge instructions.

## 2020-12-14 NOTE — Care Management (Signed)
1523 12-14-20 Case Manager spoke with patient regarding recommendations for outpatient physical therapy (PT)  for balance and strengthening. Patient is declining outpatient PT at this time-states she is active within and outside of the home. Case Manager made the patient aware that if she needs the services in the future to call her primary care provider. No further needs from Case Manager at this time. Bethena Roys, RN,BSN Case Manager

## 2020-12-14 NOTE — Progress Notes (Signed)
Patient and daughter both verbalized understanding and discharge instructions. Daughter has discharge paperwork.

## 2020-12-14 NOTE — Discharge Summary (Signed)
Physician Discharge Summary  Cindy Larson GUR:[id number] DOB: 1946/08/26 DOA: 12/13/2020  PCP: Biagio Borg, MD  Admit date: 12/13/2020 Discharge date: 12/14/2020  Admitted From: Home Disposition:  home  Recommendations for Outpatient Follow-up:  1. Follow up with PCP in 1-2 weeks 2. Please obtain BMP/CBC in one week 3. Please follow up on the following pending results:  Home Health: No Equipment/Devices:None   Discharge Condition: Stable CODE STATUS: Full code Diet recommendation: heart healthy, carb modified  Brief/Interim Summary: Patient is a 75 year old with history of hypertension, hyperlipidemia, GERD, hiatal hernia, type 2 diabetes who presented to the emergency room with left-sided numbness/tingling around her face and lips.  She was also having some trouble speaking as well as walking.  She was brought to the emergency room where she was found to be hypoglycemic.  Her blood sugar was 63.  Stroke work-up commenced she had a negative CT negative MR negative MRA.  Discharge Diagnoses:  Active Problems:   Type 2 diabetes mellitus with hyperlipidemia (HCC)   Hyperlipidemia   OBESITY   Essential hypertension   GERD   TIA (transient ischemic attack)   Hypoglycemia   Left leg swelling   TIA vs acute CVA CT of head without contrast showed no acute intracranial process MRI of brain without contrast showed no acute intracranial abnormality, normal intracranial MRA  Patient will be admitted to telemetry unit  Echocardiogram within normal limits, there was a trivial pericardial effusion.  Curbside cardiology they did not think this was significant. Continue aspirin and Plavix for 3 weeks therapy per neurology recommendation, then to remain on Plavix alone Lipid panel reveals LDL to be 54 and hemoglobin A1c is pending Continue PT/SLP/OT had no further recommendations Neurology was consulted and will follow up as an outpatient  Hypoglycemia-resolved Continue to  monitor blood glucose level  Left leg swelling Left lower extremity ultrasound ruled out DVT  CKD stage IIIb BUN to creatinine 23/1.70 (baseline creatinine 1.5-1.7), eGFR 31-stable  Questionable chest pain Patient denies chest pain, but endorsed right lateral mid back pain Troponin x2 and BMP were negative Continue Tylenol as needed  Essential hypertension Permissive hypertension will be allowed at this time  Hyperlipidemia Continue Lipitor  GERD Continue Protonix  T2DM Resume home medications, to discuss with primary care if there is ongoing issues with hypoglycemia.  Depression Continue Celexa  ObesityBMI (33.33)  Patient counseled on diet and lifestyle modification  Discharge Instructions:  Discharge Instructions    Ambulatory referral to Neurology   Complete by: As directed    Follow up with stroke clinic NP (Jessica Vanschaick or Cecille Rubin, if both not available, consider Zachery Dauer, or Ahern) at [facility] in about 4 weeks. Thanks.   Call MD for:  persistant nausea and vomiting   Complete by: As directed    Call MD for:  redness, tenderness, or signs of infection (pain, swelling, redness, odor or green/yellow discharge around incision site)   Complete by: As directed    Call MD for:  severe uncontrolled pain   Complete by: As directed    Call MD for:  temperature >100.4   Complete by: As directed    Diet - low sodium heart healthy   Complete by: As directed    Diet Carb Modified   Complete by: As directed    Discharge instructions   Complete by: As directed    Stop Aspirin and take Plavix only after 3 weeks.   Increase activity slowly   Complete by: As directed  Allergies as of 12/14/2020      Reactions   Aspirin Nausea Only, Other (See Comments)   Made the stomach "hurt"   Fexofenadine Other (See Comments)   "This makes me feel badly. I didn't feel well when I took it"   Nsaids Nausea Only, Other (See Comments)   The patient CAN  tolerate Tramadol and Tylenol- no Aleve or Motrin, though   Penicillins Hives, Other (See Comments)   Blisters, also   Prevnar [pneumococcal 13-val Conj Vacc] Swelling, Other (See Comments)   Left arm became swollen and welts appeared   Zocor [simvastatin] Itching      Medication List    TAKE these medications   Accu-Chek Nano SmartView w/Device Kit 1 Units by Does not apply route as directed. Check fasting, and two hours after breakfast, lunch and dinner   Accu-Chek SmartView test strip Generic drug: glucose blood Check blood sugars 4x/daily   acetaminophen 500 MG tablet Commonly known as: TYLENOL Take 500-1,000 mg by mouth every 8 (eight) hours as needed (for pain).   Alive Womens Gummy Chew Chew 1 tablet by mouth daily.   ALPRAZolam 0.25 MG tablet Commonly known as: XANAX Take 1 tablet by mouth twice daily as needed for anxiety What changed:   reasons to take this  additional instructions   aspirin EC 81 MG tablet Take 1 tablet (81 mg total) by mouth daily.   atorvastatin 20 MG tablet Commonly known as: LIPITOR Take 1 tablet by mouth once daily What changed: when to take this   citalopram 10 MG tablet Commonly known as: CELEXA Take 1 tablet (10 mg total) by mouth daily.   clopidogrel 75 MG tablet Commonly known as: PLAVIX Take 1 tablet (75 mg total) by mouth daily.   fexofenadine 180 MG tablet Commonly known as: ALLEGRA Take 1 tablet (180 mg total) by mouth daily.   FreeStyle [employer] Use as directed once daily E11.9   FreeStyle [employer] Use as directed daily, change every 14 days E11.9   glimepiride 4 MG tablet Commonly known as: AMARYL Take 1 tablet by mouth twice daily What changed: when to take this   lisinopril 5 MG tablet Commonly known as: ZESTRIL Take 1 tablet by mouth once daily What changed: when to take this   meclizine 25 MG tablet Commonly known as: ANTIVERT TAKE ONE TABLET BY MOUTH EVERY 6 HOURS AS  NEEDED FOR DIZZINESS What changed: See the new instructions.   metFORMIN 1000 MG tablet Commonly known as: GLUCOPHAGE Take 1 tablet (1,000 mg total) by mouth 2 (two) times daily with a meal.   Mucinex 600 MG 12 hr tablet Generic drug: guaiFENesin Take 2 tablets (1,200 mg total) by mouth 2 (two) times daily as needed. What changed: reasons to take this   omeprazole 20 MG capsule Commonly known as: PRILOSEC TAKE 1 CAPSULE BY MOUTH TWICE DAILY BEFORE MEAL(S) What changed: See the new instructions.   PARoxetine 20 MG tablet Commonly known as: PAXIL Take 1 tablet (20 mg total) by mouth daily.   promethazine 25 MG tablet Commonly known as: PHENERGAN TAKE ONE TABLET BY MOUTH EVERY 6 HOURS AS NEEDED FOR NAUSEA What changed:   reasons to take this  additional instructions   SYSTANE OP Place 1 drop into both eyes in the morning and at bedtime.   traMADol 50 MG tablet Commonly known as: ULTRAM Take 1 tablet (50 mg total) by mouth every 12 (twelve) hours as needed. What changed: when to  take this   triamcinolone 55 MCG/ACT Aero nasal inhaler Commonly known as: NASACORT Place 2 sprays into the nose daily. What changed:   when to take this  reasons to take this   Vitamin D3 50 MCG (2000 UT) Tabs Take 2,000 Units by mouth daily.       Follow-up Information    [facility]. Schedule an appointment as soon as possible for a visit in 4 week(s).   Specialty: Neurology Contact information: [address] [place] [phone]       Biagio Borg, MD Follow up.   Specialties: Internal Medicine, Radiology Contact information: [place] [address] [phone]              Allergies  Allergen Reactions  . Aspirin Nausea Only and Other (See Comments)    Made the stomach "hurt"  . Fexofenadine Other (See Comments)    "This makes me feel badly. I didn't feel well when I took it"  . Nsaids  Nausea Only and Other (See Comments)    The patient CAN tolerate Tramadol and Tylenol- no Aleve or Motrin, though  . Penicillins Hives and Other (See Comments)    Blisters, also  . Prevnar [Pneumococcal 13-Val Conj Vacc] Swelling and Other (See Comments)    Left arm became swollen and welts appeared  . Zocor [Simvastatin] Itching    Consultations:  Neurology   Procedures/Studies: CT HEAD WO CONTRAST  Result Date: 12/13/2020 CLINICAL DATA:  Left-sided facial numbness, difficulty ambulating, left lower extremity numbness EXAM: CT HEAD WITHOUT CONTRAST TECHNIQUE: Contiguous axial images were obtained from the base of the skull through the vertex without intravenous contrast. COMPARISON:  02/22/2012 FINDINGS: Brain: No acute infarct or hemorrhage. Hypodensities in the bilateral frontal periventricular white matter consistent with chronic small vessel ischemic changes, stable. Lateral ventricles and remaining midline structures are unremarkable. No acute extra-axial fluid collections. No mass effect. Vascular: No hyperdense vessel or unexpected calcification. Skull: Normal. Negative for fracture or focal lesion. Sinuses/Orbits: No acute finding. Other: None. IMPRESSION: 1. No acute intracranial process. Electronically Signed   By: Randa Ngo M.D.   On: 12/13/2020 15:14   MR ANGIO HEAD WO CONTRAST  Result Date: 12/13/2020 CLINICAL DATA:  Left-sided numbness EXAM: MRI HEAD WITHOUT CONTRAST MRA HEAD WITHOUT CONTRAST MRA NECK WITHOUT CONTRAST TECHNIQUE: Multiplanar, multiecho pulse sequences of the brain and surrounding structures were obtained without intravenous contrast. Angiographic images of the Circle of Willis were obtained using MRA technique without intravenous contrast. Angiographic images of the neck were obtained using MRA technique without intravenous contrast. Carotid stenosis measurements (when applicable) are obtained utilizing NASCET criteria, using the distal internal carotid  diameter as the denominator. COMPARISON:  None. FINDINGS: MRI HEAD FINDINGS Brain: No acute infarct, mass effect or extra-axial collection. No acute or chronic hemorrhage. Single focus of white matter hyperintensity. Normal ventricles and extra-axial CSF spaces. The midline structures are normal. Vascular: Major flow voids are preserved. Skull and upper cervical spine: Normal calvarium and skull base. Visualized upper cervical spine and soft tissues are normal. Sinuses/Orbits:No paranasal sinus fluid levels or advanced mucosal thickening. No mastoid or middle ear effusion. Normal orbits. MRA HEAD FINDINGS POSTERIOR CIRCULATION: --Vertebral arteries: Normal --Inferior cerebellar arteries: Normal. --Basilar artery: Normal. --Superior cerebellar arteries: Normal. --Posterior cerebral arteries: Normal. ANTERIOR CIRCULATION: --Intracranial internal carotid arteries: Normal. --Anterior cerebral arteries (ACA): Normal. --Middle cerebral arteries (MCA): Normal. ANATOMIC VARIANTS: None MRA NECK FINDINGS Limited time-of-flight imaging of the cervical  carotid and vertebral arteries is normal. IMPRESSION: 1. No acute intracranial abnormality. 2. Normal intracranial MRA. 3. Limited time-of-flight MRA of the neck is normal. Electronically Signed   By: Ulyses Jarred M.D.   On: 12/13/2020 21:14   MR ANGIO NECK WO CONTRAST  Result Date: 12/13/2020 CLINICAL DATA:  Left-sided numbness EXAM: MRI HEAD WITHOUT CONTRAST MRA HEAD WITHOUT CONTRAST MRA NECK WITHOUT CONTRAST TECHNIQUE: Multiplanar, multiecho pulse sequences of the brain and surrounding structures were obtained without intravenous contrast. Angiographic images of the Circle of Willis were obtained using MRA technique without intravenous contrast. Angiographic images of the neck were obtained using MRA technique without intravenous contrast. Carotid stenosis measurements (when applicable) are obtained utilizing NASCET criteria, using the distal internal carotid diameter as  the denominator. COMPARISON:  None. FINDINGS: MRI HEAD FINDINGS Brain: No acute infarct, mass effect or extra-axial collection. No acute or chronic hemorrhage. Single focus of white matter hyperintensity. Normal ventricles and extra-axial CSF spaces. The midline structures are normal. Vascular: Major flow voids are preserved. Skull and upper cervical spine: Normal calvarium and skull base. Visualized upper cervical spine and soft tissues are normal. Sinuses/Orbits:No paranasal sinus fluid levels or advanced mucosal thickening. No mastoid or middle ear effusion. Normal orbits. MRA HEAD FINDINGS POSTERIOR CIRCULATION: --Vertebral arteries: Normal --Inferior cerebellar arteries: Normal. --Basilar artery: Normal. --Superior cerebellar arteries: Normal. --Posterior cerebral arteries: Normal. ANTERIOR CIRCULATION: --Intracranial internal carotid arteries: Normal. --Anterior cerebral arteries (ACA): Normal. --Middle cerebral arteries (MCA): Normal. ANATOMIC VARIANTS: None MRA NECK FINDINGS Limited time-of-flight imaging of the cervical carotid and vertebral arteries is normal. IMPRESSION: 1. No acute intracranial abnormality. 2. Normal intracranial MRA. 3. Limited time-of-flight MRA of the neck is normal. Electronically Signed   By: Ulyses Jarred M.D.   On: 12/13/2020 21:14   MR BRAIN WO CONTRAST  Result Date: 12/13/2020 CLINICAL DATA:  Left-sided numbness EXAM: MRI HEAD WITHOUT CONTRAST MRA HEAD WITHOUT CONTRAST MRA NECK WITHOUT CONTRAST TECHNIQUE: Multiplanar, multiecho pulse sequences of the brain and surrounding structures were obtained without intravenous contrast. Angiographic images of the Circle of Willis were obtained using MRA technique without intravenous contrast. Angiographic images of the neck were obtained using MRA technique without intravenous contrast. Carotid stenosis measurements (when applicable) are obtained utilizing NASCET criteria, using the distal internal carotid diameter as the denominator.  COMPARISON:  None. FINDINGS: MRI HEAD FINDINGS Brain: No acute infarct, mass effect or extra-axial collection. No acute or chronic hemorrhage. Single focus of white matter hyperintensity. Normal ventricles and extra-axial CSF spaces. The midline structures are normal. Vascular: Major flow voids are preserved. Skull and upper cervical spine: Normal calvarium and skull base. Visualized upper cervical spine and soft tissues are normal. Sinuses/Orbits:No paranasal sinus fluid levels or advanced mucosal thickening. No mastoid or middle ear effusion. Normal orbits. MRA HEAD FINDINGS POSTERIOR CIRCULATION: --Vertebral arteries: Normal --Inferior cerebellar arteries: Normal. --Basilar artery: Normal. --Superior cerebellar arteries: Normal. --Posterior cerebral arteries: Normal. ANTERIOR CIRCULATION: --Intracranial internal carotid arteries: Normal. --Anterior cerebral arteries (ACA): Normal. --Middle cerebral arteries (MCA): Normal. ANATOMIC VARIANTS: None MRA NECK FINDINGS Limited time-of-flight imaging of the cervical carotid and vertebral arteries is normal. IMPRESSION: 1. No acute intracranial abnormality. 2. Normal intracranial MRA. 3. Limited time-of-flight MRA of the neck is normal. Electronically Signed   By: Ulyses Jarred M.D.   On: 12/13/2020 21:14   DG Chest Port 1 View  Result Date: 12/13/2020 CLINICAL DATA:  Short of breath.  Left-sided numbness. EXAM: PORTABLE CHEST 1 VIEW COMPARISON:  04/18/2015 FINDINGS: The heart size and mediastinal  contours are within normal limits. Both lungs are clear. The visualized skeletal structures are unremarkable. IMPRESSION: No active disease. Electronically Signed   By: Franchot Gallo M.D.   On: 12/13/2020 13:26   ECHOCARDIOGRAM COMPLETE  Result Date: 12/14/2020    ECHOCARDIOGRAM REPORT   Patient Name:   Cindy Larson Date of Exam: 12/14/2020 Medical Rec #:  [id number]               Height:       59.0 in Accession #:    [id number]              Weight:        165.0 lb Date of Birth:  1946-07-14               BSA:          1.700 m Patient Age:    6 years                BP:           114/70 mmHg Patient Gender: F                       HR:           79 bpm. Exam Location:  Inpatient Procedure: 2D Echo, Cardiac Doppler and Color Doppler Indications:    Stroke  History:        Patient has no prior history of Echocardiogram examinations.                 Risk Factors:Hypertension, Dyslipidemia and Diabetes.  Sonographer:    Maudry Mayhew MHA, RDMS, RVT, RDCS Referring Phys: [id number] [facility] ADEFESO  Sonographer Comments: Image acquisition challenging due to respiratory motion and Image acquisition challenging due to patient body habitus. IMPRESSIONS  1. Left ventricular ejection fraction, by estimation, is 50 to 55%. The left ventricle has low normal function. The left ventricle has no regional wall motion abnormalities. Left ventricular diastolic parameters were normal.  2. Right ventricular systolic function was not well visualized. The right ventricular size is not well visualized.  3. A small pericardial effusion is present. The pericardial effusion is circumferential.  4. The mitral valve is normal in structure. No evidence of mitral valve regurgitation. No evidence of mitral stenosis.  5. The aortic valve has an indeterminant number of cusps. There is mild calcification of the aortic valve. There is mild thickening of the aortic valve. Aortic valve regurgitation is not visualized. No aortic stenosis is present.  6. The inferior vena cava is normal in size with greater than 50% respiratory variability, suggesting right atrial pressure of 3 mmHg. FINDINGS  Left Ventricle: Left ventricular ejection fraction, by estimation, is 50 to 55%. The left ventricle has low normal function. The left ventricle has no regional wall motion abnormalities. The left ventricular internal cavity size was normal in size. There is no left ventricular hypertrophy. Left ventricular diastolic  parameters were normal. Right Ventricle: RV poorly visualized, grossly appears normal in size and function. The right ventricular size is not well visualized. Right vetricular wall thickness was not well visualized. Right ventricular systolic function was not well visualized. Left Atrium: Left atrial size was normal in size. Right Atrium: Right atrial size was not well visualized. Pericardium: A small pericardial effusion is present. The pericardial effusion is circumferential. Mitral Valve: The mitral valve is normal in structure. No evidence of mitral valve regurgitation. No evidence of mitral valve stenosis. Tricuspid Valve: The  tricuspid valve is normal in structure. Tricuspid valve regurgitation is trivial. No evidence of tricuspid stenosis. Aortic Valve: The aortic valve has an indeterminant number of cusps. There is mild calcification of the aortic valve. There is mild thickening of the aortic valve. There is mild aortic valve annular calcification. Aortic valve regurgitation is not visualized. No aortic stenosis is present. Aortic valve mean gradient measures 4.0 mmHg. Aortic valve peak gradient measures 7.2 mmHg. Aortic valve area, by VTI measures 1.79 cm. Pulmonic Valve: The pulmonic valve was not well visualized. Pulmonic valve regurgitation is not visualized. No evidence of pulmonic stenosis. Aorta: The aortic root is normal in size and structure. Pulmonary Artery: Indeterminant PASP, inadequate TR jet. Venous: The inferior vena cava is normal in size with greater than 50% respiratory variability, suggesting right atrial pressure of 3 mmHg. IAS/Shunts: No atrial level shunt detected by color flow Doppler.  LEFT VENTRICLE PLAX 2D LVIDd:         4.30 cm     Diastology LVIDs:         3.40 cm     LV e' medial:    7.62 cm/s LV PW:         0.70 cm     LV E/e' medial:  12.7 LV IVS:        0.70 cm     LV e' lateral:   10.40 cm/s LVOT diam:     1.90 cm     LV E/e' lateral: 9.3 LV SV:         52 LV SV Index:   30  LVOT Area:     2.84 cm  LV Volumes (MOD) LV vol d, MOD A2C: 45.9 ml LV vol d, MOD A4C: 27.5 ml LV vol s, MOD A2C: 28.7 ml LV vol s, MOD A4C: 13.4 ml LV SV MOD A2C:     17.2 ml LV SV MOD A4C:     27.5 ml LV SV MOD BP:      15.6 ml RIGHT VENTRICLE RV S prime:     9.46 cm/s LEFT ATRIUM             Index       RIGHT ATRIUM           Index LA diam:        3.00 cm 1.77 cm/m  RA Area:     11.00 cm LA Vol (A2C):   32.2 ml 18.95 ml/m RA Volume:   24.90 ml  14.65 ml/m LA Vol (A4C):   34.9 ml 20.53 ml/m LA Biplane Vol: 33.2 ml 19.53 ml/m  AORTIC VALVE AV Area (Vmax):    1.75 cm AV Area (Vmean):   1.74 cm AV Area (VTI):     1.79 cm AV Vmax:           134.00 cm/s AV Vmean:          88.800 cm/s AV VTI:            0.288 m AV Peak Grad:      7.2 mmHg AV Mean Grad:      4.0 mmHg LVOT Vmax:         82.90 cm/s LVOT Vmean:        54.500 cm/s LVOT VTI:          0.182 m LVOT/AV VTI ratio: 0.63  AORTA Ao Root diam: 2.20 cm MITRAL VALVE                TRICUSPID VALVE  MV Area (PHT): 3.83 cm     TR Peak grad:   19.2 mmHg MV Decel Time: 198 msec     TR Vmax:        219.00 cm/s MR Peak grad: 5.9 mmHg MR Mean grad: 3.0 mmHg      SHUNTS MR Vmax:      121.00 cm/s   Systemic VTI:  0.18 m MR Vmean:     83.3 cm/s     Systemic Diam: 1.90 cm MV E velocity: 96.40 cm/s MV A velocity: 104.00 cm/s MV E/A ratio:  0.93 Carlyle Dolly MD Electronically signed by Carlyle Dolly MD Signature Date/Time: 12/14/2020/1:20:58 PM    Final    VAS [place] LOWER EXTREMITY VENOUS (DVT)  Result Date: 12/14/2020  Lower Venous DVT Study Indications: Stroke.  Limitations: Poor ultrasound/tissue interface. Comparison Study: No prior study Performing Technologist: Maudry Mayhew MHA, RDMS, RVT, RDCS  Examination Guidelines: A complete evaluation includes B-mode imaging, spectral Doppler, color Doppler, and power Doppler as needed of all accessible portions of each vessel. Bilateral testing is considered an integral part of a complete examination. Limited  examinations for reoccurring indications may be performed as noted. The reflux portion of the exam is performed with the patient in reverse Trendelenburg.  +---------+---------------+---------+-----------+----------+--------------+ RIGHT    CompressibilityPhasicitySpontaneityPropertiesThrombus Aging +---------+---------------+---------+-----------+----------+--------------+ CFV      Full           Yes      Yes                                 +---------+---------------+---------+-----------+----------+--------------+ SFJ      Full                                                        +---------+---------------+---------+-----------+----------+--------------+ FV Prox  Full                                                        +---------+---------------+---------+-----------+----------+--------------+ FV Mid   Full                                                        +---------+---------------+---------+-----------+----------+--------------+ FV DistalFull                                                        +---------+---------------+---------+-----------+----------+--------------+ PFV      Full                                                        +---------+---------------+---------+-----------+----------+--------------+ POP      Full  Yes      Yes                                 +---------+---------------+---------+-----------+----------+--------------+ PTV      Full                                                        +---------+---------------+---------+-----------+----------+--------------+ PERO     Full                                                        +---------+---------------+---------+-----------+----------+--------------+   +---------+---------------+---------+-----------+----------+--------------+ LEFT     CompressibilityPhasicitySpontaneityPropertiesThrombus Aging  +---------+---------------+---------+-----------+----------+--------------+ CFV      Full           Yes      Yes                                 +---------+---------------+---------+-----------+----------+--------------+ SFJ      Full                                                        +---------+---------------+---------+-----------+----------+--------------+ FV Prox  Full                                                        +---------+---------------+---------+-----------+----------+--------------+ FV Mid   Full                                                        +---------+---------------+---------+-----------+----------+--------------+ FV DistalFull                                                        +---------+---------------+---------+-----------+----------+--------------+ PFV      Full                                                        +---------+---------------+---------+-----------+----------+--------------+ POP      Full           Yes      Yes                                 +---------+---------------+---------+-----------+----------+--------------+ PTV  Full                                                        +---------+---------------+---------+-----------+----------+--------------+ PERO     Full                                                        +---------+---------------+---------+-----------+----------+--------------+     Summary: BILATERAL: - No evidence of deep vein thrombosis seen in the lower extremities, bilaterally. -No evidence of popliteal cyst, bilaterally.   *See table(s) above for measurements and observations.    Preliminary       Subjective: No new complaints today.  She reports she is back to her baseline.  Discharge Exam: Vitals:   12/14/20 0309 12/14/20 0700  BP: 130/63 114/70  Pulse: 73 84  Resp: 18 20  Temp: 97.6 F (36.4 C) 97.9 F (36.6 C)  SpO2: 100% 99%   Vitals:    12/13/20 2330 12/14/20 0050 12/14/20 0309 12/14/20 0700  BP: 113/61 139/63 130/63 114/70  Pulse: 82 84 73 84  Resp: _0 Temp:  98.1 F (36.7 C) 97.6 F (36.4 C) 97.9 F (36.6 C)  TempSrc:  Oral Oral Oral  SpO2: 100% 100% 100% 99%  Weight:      Height:        General: Pt is alert, awake, not in acute distress Cardiovascular: RRR, S1/S2 +, no rubs, no gallops Respiratory: CTA bilaterally, no wheezing, no rhonchi Abdominal: Soft, NT, ND, bowel sounds + Extremities: no edema, no cyanosis, minimal swelling left ankle and able to bear weight without significant pain.    The results of significant diagnostics from this hospitalization (including imaging, microbiology, ancillary and laboratory) are listed below for reference.     Microbiology: Recent Results (from the past 240 hour(s))  SARS CORONAVIRUS 2 (TAT 6-24 HRS) Nasopharyngeal Nasopharyngeal Swab     Status: None   Collection Time: 12/13/20  3:35 PM   Specimen: Nasopharyngeal Swab  Result Value Ref Range Status   SARS Coronavirus 2 NEGATIVE NEGATIVE Final    Comment: (NOTE) SARS-CoV-2 target nucleic acids are NOT DETECTED.  The SARS-CoV-2 RNA is generally detectable in upper and lower respiratory specimens during the acute phase of infection. Negative results do not preclude SARS-CoV-2 infection, do not rule out co-infections with other pathogens, and should not be used as the sole basis for treatment or other patient management decisions. Negative results must be combined with clinical observations, patient history, and epidemiological information. The expected result is Negative.  Fact Sheet for Patients: SugarRoll.be  Fact Sheet for Healthcare Providers: https://www.woods-mathews.com/  This test is not yet approved or cleared by the [place] FDA and  has been authorized for detection and/or diagnosis of SARS-CoV-2 by FDA under an Emergency Use Authorization  (EUA). This EUA will remain  in effect (meaning this test can be used) for the duration of the COVID-19 declaration under Se ction 564(b)(1) of the Act, 21 U.S.C. section 360bbb-3(b)(1), unless the authorization is terminated or revoked sooner.  Performed at [facility], [place] [address], [place], [place] [address]      Labs: BNP (last  3 results) Recent Labs    12/13/20 1250  BNP 19.6   Basic Metabolic Panel: Recent Labs  Lab 12/13/20 1250 12/13/20 1334 12/14/20 0211  NA 138 139 139  K 4.6 4.6 5.4*  CL 106 105 106  CO2 23  --  26  GLUCOSE 67* 63* 148*  BUN 23 29* 22  CREATININE 1.70* 1.80* 1.56*  CALCIUM 8.8*  --  9.0  MG  --   --  1.3*  PHOS  --   --  2.5   Liver Function Tests: Recent Labs  Lab 12/13/20 1250 12/14/20 0211  AST 21 15  ALT 15 14  ALKPHOS 54 46  BILITOT 0.5 0.5  PROT 6.5 5.9*  ALBUMIN 3.8 3.4*   CBC: Recent Labs  Lab 12/13/20 1250 12/13/20 1334 12/14/20 0211  WBC 5.2  --  4.8  NEUTROABS 2.6  --   --   HGB 10.6* 11.6* 9.6*  HCT 34.9* 34.0* 30.9*  MCV 91.1  --  88.5  PLT 314  --  264   CBG: Recent Labs  Lab 12/13/20 1340 12/13/20 1435 12/13/20 1743 12/14/20 0105 12/14/20 0645  GLUCAP 63* 77 149* 184* 90   Lipid Profile Recent Labs    12/14/20 0211  CHOL 115  HDL 44  LDLCALC 54  TRIG 84  CHOLHDL 2.6   Component 1 d ago  (12/13/20) 1 d ago  (12/13/20)  Troponin I (High Sensitivity) 4  4 CM      Urinalysis    Component Value Date/Time   COLORURINE STRAW (A) 12/13/2020 1520   APPEARANCEUR CLEAR 12/13/2020 1520   LABSPEC 1.010 12/13/2020 1520   PHURINE 5.0 12/13/2020 1520   GLUCOSEU NEGATIVE 12/13/2020 1520   GLUCOSEU 100 (A) 02/16/2020 1429   HGBUR NEGATIVE 12/13/2020 1520   BILIRUBINUR NEGATIVE 12/13/2020 1520   KETONESUR NEGATIVE 12/13/2020 1520   PROTEINUR NEGATIVE 12/13/2020 1520   UROBILINOGEN 0.2 02/16/2020 1429   NITRITE NEGATIVE 12/13/2020 [place] 12/13/2020 1520   Sepsis  Labs Microbiology Recent Results (from the past 240 hour(s))  SARS CORONAVIRUS 2 (TAT 6-24 HRS) Nasopharyngeal Nasopharyngeal Swab     Status: None   Collection Time: 12/13/20  3:35 PM   Specimen: Nasopharyngeal Swab  Result Value Ref Range Status   SARS Coronavirus 2 NEGATIVE NEGATIVE Final    Comment: (NOTE) SARS-CoV-2 target nucleic acids are NOT DETECTED.  The SARS-CoV-2 RNA is generally detectable in upper and lower respiratory specimens during the acute phase of infection. Negative results do not preclude SARS-CoV-2 infection, do not rule out co-infections with other pathogens, and should not be used as the sole basis for treatment or other patient management decisions. Negative results must be combined with clinical observations, patient history, and epidemiological information. The expected result is Negative.  Fact Sheet for Patients: SugarRoll.be  Fact Sheet for Healthcare Providers: https://www.woods-mathews.com/  This test is not yet approved or cleared by the [place] FDA and  has been authorized for detection and/or diagnosis of SARS-CoV-2 by FDA under an Emergency Use Authorization (EUA). This EUA will remain  in effect (meaning this test can be used) for the duration of the COVID-19 declaration under Se ction 564(b)(1) of the Act, 21 U.S.C. section 360bbb-3(b)(1), unless the authorization is terminated or revoked sooner.  Performed at [facility], [place] [address], [place], [place] [address]      Time coordinating discharge: Over 30 minutes  SIGNED:   Donnamae Jude, MD  [facility]  12/14/2020, 2:19 PM  If 7PM-7AM, please contact night-coverage

## 2020-12-14 NOTE — Plan of Care (Signed)
  Problem: Education: Goal: Knowledge of disease or condition will improve Outcome: Progressing Goal: Knowledge of secondary prevention will improve Outcome: Progressing Goal: Knowledge of patient specific risk factors addressed and post discharge goals established will improve Outcome: Progressing Goal: Individualized Educational Video(s) Outcome: Progressing   Problem: Coping: Goal: Will verbalize positive feelings about self Outcome: Progressing Goal: Will identify appropriate support needs Outcome: Progressing   Problem: Self-Care: Goal: Ability to participate in self-care as condition permits will improve Outcome: Progressing

## 2020-12-14 NOTE — Evaluation (Signed)
Physical Therapy Evaluation Patient Details Name: Cindy Larson MRN: [id number] DOB: [birth year] Today's Date: 12/14/2020   History of Present Illness  75 y/o female presented to ED on 4/15 with complaint of L sided numbness/tingling. MRI and CT head negative for acute abnormalities. PMH: anxiety, depression, DM type II, GERD, HLD, HTN, osteopenia, intermittent vertigo.  Clinical Impression  PTA, patient lives with husband and reports independence, drives, and gardens. Patient presents with generalized weakness (R>L), mild balance deficits, and decreased activity tolerance. Patient modI for bed mobility and transfers. Patient ambulated 200' with supervision and no AD. Negotiated 2 stairs with supervision, B handrails, and step to pattern. Patient will benefit from skilled PT services during acute stay to address listed deficits. Recommend OPPT following discharge to improve dynamic balance and strengthening.     Follow Up Recommendations Outpatient PT    Equipment Recommendations  None recommended by PT    Recommendations for Other Services       Precautions / Restrictions Precautions Precautions: Fall Restrictions Weight Bearing Restrictions: No      Mobility  Bed Mobility Overal bed mobility: Modified Independent                  Transfers Overall transfer level: Modified independent                  Ambulation/Gait Ambulation/Gait assistance: Supervision Gait Distance (Feet): 200 Feet Assistive device: None Gait Pattern/deviations: Drifts right/left;Step-through pattern Gait velocity: decreased Gait velocity interpretation: >2.62 ft/sec, indicative of community ambulatory General Gait Details: drifts L/R throughout and reports feeling unsteady with ambulation  Stairs Stairs: Yes Stairs assistance: Supervision Stair Management: Two rails;Step to pattern;Forwards Number of Stairs: 2 General stair comments: supervision for safety  Wheelchair  Mobility    Modified Rankin (Stroke Patients Only)       Balance Overall balance assessment: Mild deficits observed, not formally tested                                           Pertinent Vitals/Pain Pain Assessment: 0-10 Pain Score: 3  Pain Location: R side of back Pain Descriptors / Indicators: Discomfort Pain Intervention(s): Monitored during session;Repositioned    Home Living Family/patient expects to be discharged to:: Private residence Living Arrangements: Spouse/significant other Available Help at Discharge: Family;Available 24 hours/day Type of Home: House Home Access: Stairs to enter Entrance Stairs-Rails: Right;Left;Can reach both Entrance Stairs-Number of Steps: 3 Home Layout: One level Home Equipment: None      Prior Function Level of Independence: Independent         Comments: drives, gardens     Journalist, newspaper        Extremity/Trunk Assessment   Upper Extremity Assessment Upper Extremity Assessment: Defer to OT evaluation    Lower Extremity Assessment Lower Extremity Assessment: Generalized weakness (R weaker than L)    Cervical / Trunk Assessment Cervical / Trunk Assessment: Kyphotic  Communication   Communication: No difficulties  Cognition Arousal/Alertness: Awake/alert Behavior During Therapy: WFL for tasks assessed/performed Overall Cognitive Status: Within Functional Limits for tasks assessed                                        General Comments      Exercises     Assessment/Plan    PT  Assessment Patient needs continued PT services  PT Problem List Decreased strength;Decreased activity tolerance;Decreased balance;Decreased mobility       PT Treatment Interventions DME instruction;Gait training;Stair training;Functional mobility training;Therapeutic activities;Therapeutic exercise;Balance training;Patient/family education    PT Goals (Current goals can be found in the Care Plan  section)  Acute Rehab PT Goals Patient Stated Goal: to go home PT Goal Formulation: With patient Time For Goal Achievement: 12/28/20 Potential to Achieve Goals: Good    Frequency Min 3X/week   Barriers to discharge        Co-evaluation               AM-PAC PT "6 Clicks" Mobility  Outcome Measure Help needed turning from your back to your side while in a flat bed without using bedrails?: None Help needed moving from lying on your back to sitting on the side of a flat bed without using bedrails?: None Help needed moving to and from a bed to a chair (including a wheelchair)?: A Little Help needed standing up from a chair using your arms (e.g., wheelchair or bedside chair)?: A Little Help needed to walk in hospital room?: A Little Help needed climbing 3-5 steps with a railing? : A Little 6 Click Score: 20    End of Session Equipment Utilized During Treatment: Gait belt Activity Tolerance: Patient tolerated treatment well Patient left: in chair;with call bell/phone within reach (handoff to OT) Nurse Communication: Mobility status PT Visit Diagnosis: Unsteadiness on feet (R26.81);Muscle weakness (generalized) (M62.81)    Time: 6644-0347 PT Time Calculation (min) (ACUTE ONLY): 30 min   Charges:   PT Evaluation $PT Eval Low Complexity: 1 Low          Norleen Xie A. Gilford Rile PT, DPT Acute Rehabilitation Services Pager [phone] Office [phone]   Linna Hoff 12/14/2020, 9:49 AM

## 2020-12-16 LAB — HEMOGLOBIN A1C
Hgb A1c MFr Bld: 6 % — ABNORMAL HIGH (ref 4.8–5.6)
Mean Plasma Glucose: 126 mg/dL

## 2020-12-19 ENCOUNTER — Other Ambulatory Visit: Payer: Self-pay

## 2020-12-19 ENCOUNTER — Encounter: Payer: Self-pay | Admitting: Internal Medicine

## 2020-12-19 ENCOUNTER — Ambulatory Visit (INDEPENDENT_AMBULATORY_CARE_PROVIDER_SITE_OTHER): Payer: Medicare Other | Admitting: Internal Medicine

## 2020-12-19 ENCOUNTER — Telehealth: Payer: Self-pay

## 2020-12-19 ENCOUNTER — Ambulatory Visit (INDEPENDENT_AMBULATORY_CARE_PROVIDER_SITE_OTHER): Payer: Medicare Other

## 2020-12-19 VITALS — BP 110/62 | HR 89 | Temp 97.7°F | Ht 59.0 in | Wt 166.0 lb

## 2020-12-19 DIAGNOSIS — S93402D Sprain of unspecified ligament of left ankle, subsequent encounter: Secondary | ICD-10-CM

## 2020-12-19 DIAGNOSIS — N1831 Chronic kidney disease, stage 3a: Secondary | ICD-10-CM | POA: Diagnosis not present

## 2020-12-19 DIAGNOSIS — E785 Hyperlipidemia, unspecified: Secondary | ICD-10-CM | POA: Diagnosis not present

## 2020-12-19 DIAGNOSIS — E782 Mixed hyperlipidemia: Secondary | ICD-10-CM

## 2020-12-19 DIAGNOSIS — G459 Transient cerebral ischemic attack, unspecified: Secondary | ICD-10-CM | POA: Diagnosis not present

## 2020-12-19 DIAGNOSIS — E1169 Type 2 diabetes mellitus with other specified complication: Secondary | ICD-10-CM

## 2020-12-19 DIAGNOSIS — S93402A Sprain of unspecified ligament of left ankle, initial encounter: Secondary | ICD-10-CM | POA: Insufficient documentation

## 2020-12-19 DIAGNOSIS — M7989 Other specified soft tissue disorders: Secondary | ICD-10-CM | POA: Diagnosis not present

## 2020-12-19 DIAGNOSIS — I10 Essential (primary) hypertension: Secondary | ICD-10-CM

## 2020-12-19 MED ORDER — GLIMEPIRIDE 2 MG PO TABS: 2.0000 mg | ORAL_TABLET | Freq: Every day | ORAL | 3 refills | Status: DC

## 2020-12-19 MED ORDER — LANCETS MISC: 11 refills | Status: DC

## 2020-12-19 MED ORDER — ACCU-CHEK SMARTVIEW VI STRP: ORAL_STRIP | 12 refills | Status: DC

## 2020-12-19 MED ORDER — ACCU-CHEK NANO SMARTVIEW W/DEVICE KIT: 1.0000 [IU] | PACK | 0 refills | Status: DC

## 2020-12-19 NOTE — Progress Notes (Signed)
Patient ID: Cindy Larson, female   DOB: [birth year], 75 y.o.   MRN: [id number]        Chief Complaint: follow up low blood sugar and tia with recent hospn       HPI:  Cindy Larson is a 75 y.o. female here with c/o f/u recent hospn apr 15 - 16 with above after recent mild wt loss and good compliance with oral meds including glimeparide 4 bid, not reduced on d/c.  Symptoms felt possibly TIA now on asa/plavix x 3 wks then plavix only and has f/u neurology may 25.  No further low sugars.  Pt denies chest pain, increased sob or doe, wheezing, orthopnea, PND, increased LE swelling, palpitations, dizziness or syncope.  deneis other new neuro focal s/s.  Pt denies fever, wt loss, night sweats, loss of appetite, or other constitutional symptoms  Also incidentally turned ankle x 2 days ago with pain and swelling medial, mod, intermittent, worse to walk, better to sit.         Wt Readings from Last 3 Encounters:  12/19/20 166 lb (75.3 kg)  12/13/20 165 lb (74.8 kg)  10/08/20 170 lb (77.1 kg)   BP Readings from Last 3 Encounters:  12/19/20 110/62  12/14/20 137/77  10/08/20 (!) 120/58         Past Medical History:  Diagnosis Date  . ALLERGIC RHINITIS   . ANXIETY   . COLONIC POLYPS, HX OF   . DEPRESSION   . DIABETES MELLITUS, TYPE II   . Diverticulitis   . DIVERTICULOSIS, COLON   . Esophageal stricture   . Gallstones   . GERD   . HIATAL HERNIA   . History of blood transfusion    left knee replacement  . HYPERLIPIDEMIA   . Hypersomnolence   . HYPERTENSION   . INTERMITTENT VERTIGO   . OBESITY   . OSTEOARTHRITIS    L knee  . OSTEOPENIA   . Pneumonia   . Post-menopausal    hormone replacement therapy   Past Surgical History:  Procedure Laterality Date  . ABDOMINAL HYSTERECTOMY    . CESAREAN SECTION    . CHOLECYSTECTOMY    . COLONOSCOPY  03/02/2017   [name]  . Lysis of Adhesions    . OOPHORECTOMY     one  . POLYPECTOMY    . ROTATOR CUFF REPAIR Left   . TOTAL  KNEE ARTHROPLASTY Left     reports that she has never smoked. She has never used smokeless tobacco. She reports that she does not drink alcohol and does not use drugs. family history includes Atrial fibrillation in her mother; Breast cancer in her maternal grandmother and paternal aunt; Cancer in her mother; Colon cancer in her paternal aunt; Diabetes in an other family member; Heart disease in her mother; Heart failure in her brother; Hypertension in her mother; Kidney disease in her maternal aunt; Lung cancer in her brother; Ovarian cancer in her sister. Allergies  Allergen Reactions  . Aspirin Nausea Only and Other (See Comments)    Made the stomach "hurt"  . Fexofenadine Other (See Comments)    "This makes me feel badly. I didn't feel well when I took it"  . Nsaids Nausea Only and Other (See Comments)    The patient CAN tolerate Tramadol and Tylenol- no Aleve or Motrin, though  . Penicillins Hives and Other (See Comments)    Blisters, also  . Prevnar [Pneumococcal 13-Val Conj Vacc] Swelling and Other (See Comments)  Left arm became swollen and welts appeared  . Zocor [Simvastatin] Itching   Current Outpatient Medications on File Prior to Visit  Medication Sig Dispense Refill  . acetaminophen (TYLENOL) 500 MG tablet Take 500-1,000 mg by mouth every 8 (eight) hours as needed (for pain).    Marland Kitchen ALPRAZolam (XANAX) 0.25 MG tablet Take 1 tablet by mouth twice daily as needed for anxiety (Patient taking differently: Take 0.25 mg by mouth 2 (two) times daily as needed for anxiety.) 60 tablet 5  . aspirin EC 81 MG tablet Take 1 tablet (81 mg total) by mouth daily. 90 tablet 11  . atorvastatin (LIPITOR) 20 MG tablet Take 1 tablet by mouth once daily (Patient taking differently: Take 20 mg by mouth at bedtime.) 90 tablet 1  . Cholecalciferol (VITAMIN D3) 50 MCG (2000 UT) TABS Take 2,000 Units by mouth daily.    . citalopram (CELEXA) 10 MG tablet Take 1 tablet (10 mg total) by mouth daily. 90 tablet  3  . clopidogrel (PLAVIX) 75 MG tablet Take 1 tablet (75 mg total) by mouth daily. 30 tablet 1  . fexofenadine (ALLEGRA) 180 MG tablet Take 1 tablet (180 mg total) by mouth daily. 30 tablet 2  . guaiFENesin (MUCINEX) 600 MG 12 hr tablet Take 2 tablets (1,200 mg total) by mouth 2 (two) times daily as needed. (Patient taking differently: Take 1,200 mg by mouth 2 (two) times daily as needed for to loosen phlegm.) 60 tablet 1  . lisinopril (ZESTRIL) 5 MG tablet Take 1 tablet by mouth once daily (Patient taking differently: Take 5 mg by mouth in the morning.) 30 tablet 0  . meclizine (ANTIVERT) 25 MG tablet TAKE ONE TABLET BY MOUTH EVERY 6 HOURS AS NEEDED FOR DIZZINESS (Patient taking differently: Take 25 mg by mouth every 6 (six) hours as needed for dizziness.) 30 tablet 2  . metFORMIN (GLUCOPHAGE) 1000 MG tablet Take 1 tablet (1,000 mg total) by mouth 2 (two) times daily with a meal. 180 tablet 3  . Multiple Vitamins-Minerals (ALIVE WOMENS GUMMY) CHEW Chew 1 tablet by mouth daily.    Marland Kitchen omeprazole (PRILOSEC) 20 MG capsule TAKE 1 CAPSULE BY MOUTH TWICE DAILY BEFORE MEAL(S) (Patient taking differently: Take 40 mg by mouth in the morning.) 180 capsule 0  . PARoxetine (PAXIL) 20 MG tablet Take 1 tablet (20 mg total) by mouth daily. 90 tablet 3  . Polyethyl Glycol-Propyl Glycol (SYSTANE OP) Place 1 drop into both eyes in the morning and at bedtime.    . promethazine (PHENERGAN) 25 MG tablet TAKE ONE TABLET BY MOUTH EVERY 6 HOURS AS NEEDED FOR NAUSEA (Patient taking differently: Take 25 mg by mouth every 6 (six) hours as needed for nausea.) 40 tablet 0  . traMADol (ULTRAM) 50 MG tablet Take 1 tablet (50 mg total) by mouth every 12 (twelve) hours as needed. 30 tablet 0  . triamcinolone (NASACORT) 55 MCG/ACT AERO nasal inhaler Place 2 sprays into the nose daily. (Patient taking differently: Place 2 sprays into the nose daily as needed (for allergies or sinus issues).) 1 Inhaler 12   No current  facility-administered medications on file prior to visit.        ROS:  All others reviewed and negative.  Objective        PE:  BP 110/62 (BP Location: Left Arm, Patient Position: Sitting, Cuff Size: Large)   Pulse 89   Temp 97.7 F (36.5 C) (Oral)   Ht 4\' 11"  (1.499 m)   Wt 166 lb (  75.3 kg)   SpO2 97%   BMI 33.53 kg/m                 Constitutional: Pt appears in NAD               HENT: Head: NCAT.                Right Ear: External ear normal.                 Left Ear: External ear normal.                Eyes: . Pupils are equal, round, and reactive to light. Conjunctivae and EOM are normal               Nose: without d/c or deformity               Neck: Neck supple. Gross normal ROM               Cardiovascular: Normal rate and regular rhythm.                 Pulmonary/Chest: Effort normal and breath sounds without rales or wheezing.                Abd:  Soft, NT, ND, + BS, no organomegaly               Neurological: Pt is alert. At baseline orientation, motor grossly intact, cn 2-12 intact               Skin: Skin is warm. No rashes, no other new lesions, LE edema - none; left ankle with 1+ diffuse swelling               Psychiatric: Pt behavior is normal without agitation   Micro: none  Cardiac tracings I have personally interpreted today:  none  Pertinent Radiological findings (summarize): none   Lab Results  Component Value Date   WBC 4.8 12/14/2020   HGB 9.6 (L) 12/14/2020   HCT 30.9 (L) 12/14/2020   PLT 264 12/14/2020   GLUCOSE 148 (H) 12/14/2020   CHOL 115 12/14/2020   TRIG 84 12/14/2020   HDL 44 12/14/2020   LDLDIRECT 73.0 02/16/2020   LDLCALC 54 12/14/2020   ALT 14 12/14/2020   AST 15 12/14/2020   NA 139 12/14/2020   K 5.4 (H) 12/14/2020   CL 106 12/14/2020   CREATININE 1.56 (H) 12/14/2020   BUN 22 12/14/2020   CO2 26 12/14/2020   TSH 2.25 02/16/2020   INR 0.9 12/14/2020   HGBA1C 6.0 (H) 12/14/2020   MICROALBUR 3.4 (H) 02/16/2020    Assessment/Plan:  Cindy Larson is a 75 y.o. Black or African American [2] female with  has a past medical history of ALLERGIC RHINITIS, ANXIETY, COLONIC POLYPS, HX OF, DEPRESSION, DIABETES MELLITUS, TYPE II, Diverticulitis, DIVERTICULOSIS, COLON, Esophageal stricture, Gallstones, GERD, HIATAL HERNIA, History of blood transfusion, HYPERLIPIDEMIA, Hypersomnolence, HYPERTENSION, INTERMITTENT VERTIGO, OBESITY, OSTEOARTHRITIS, OSTEOPENIA, Pneumonia, and Post-menopausal.  Type 2 diabetes mellitus with hyperlipidemia ([place]) Overcontrolled, to decrease the glimeparide to 2 mg only daily  Hyperlipidemia Lab Results  Component Value Date   LDLCALC 54 12/14/2020   Stable, pt to continue current statin lipitor 20   Essential hypertension BP Readings from Last 3 Encounters:  12/19/20 110/62  12/14/20 137/77  10/08/20 (!) 120/58   Stable, pt to continue medical treatment zestril 5   CKD (chronic kidney disease) Lab Results  Component Value  Date   CREATININE 1.56 (H) 12/14/2020   Stable overall, cont to avoid nephrotoxins  Left ankle sprain For left ankle film, consider sport med referral  TIA (transient ischemic attack) Resolved, stable, for asa /plavix x 3 wks,then plavix only, and f/u neurology may 25  Followup: Return if symptoms worsen or fail to improve.  Cathlean Cower, MD 12/22/2020 1:04 PM [facility]

## 2020-12-19 NOTE — Telephone Encounter (Signed)
accu check done

## 2020-12-19 NOTE — Patient Instructions (Signed)
Ok to decrease the glimeparide to 2 mg in the am only  Please continue all other medications as before, and refills have been done if requested.  Please have the pharmacy call with any other refills you may need.  Please keep your appointments with your specialists as you may have planned  Please go to the XRAY Department in the first floor for the x-ray testing  You will be contacted by phone if any changes need to be made immediately.  Otherwise, you will receive a letter about your results with an explanation, but please check with MyChart first.  Please remember to sign up for MyChart if you have not done so, as this will be important to you in the future with finding out test results, communicating by private email, and scheduling acute appointments online when needed.

## 2020-12-20 ENCOUNTER — Encounter: Payer: Self-pay | Admitting: Internal Medicine

## 2020-12-20 MED ORDER — ACCU-CHEK GUIDE VI STRP: ORAL_STRIP | 12 refills | Status: DC

## 2020-12-20 MED ORDER — ACCU-CHEK GUIDE ME W/DEVICE KIT: PACK | 0 refills | Status: DC

## 2020-12-20 NOTE — Addendum Note (Signed)
Addended by: Biagio Borg on: 12/20/2020 12:51 PM   Modules accepted: Orders

## 2020-12-20 NOTE — Telephone Encounter (Signed)
[facility] calling, states we sent in the smart view which is now discontinued so she needs [place] to send in the accucheck guide me and the guide strips

## 2020-12-22 ENCOUNTER — Encounter: Payer: Self-pay | Admitting: Internal Medicine

## 2020-12-22 NOTE — Assessment & Plan Note (Signed)
BP Readings from Last 3 Encounters:  12/19/20 110/62  12/14/20 137/77  10/08/20 (!) 120/58   Stable, pt to continue medical treatment zestril 5

## 2020-12-22 NOTE — Assessment & Plan Note (Signed)
For left ankle film, consider sport med referral

## 2020-12-22 NOTE — Assessment & Plan Note (Signed)
Resolved, stable, for asa /plavix x 3 wks,then plavix only, and f/u neurology may 25

## 2020-12-22 NOTE — Assessment & Plan Note (Signed)
Lab Results  Component Value Date   LDLCALC 54 12/14/2020   Stable, pt to continue current statin lipitor 20

## 2020-12-22 NOTE — Assessment & Plan Note (Signed)
Lab Results  Component Value Date   CREATININE 1.56 (H) 12/14/2020   Stable overall, cont to avoid nephrotoxins

## 2020-12-22 NOTE — Assessment & Plan Note (Signed)
Overcontrolled, to decrease the glimeparide to 2 mg only daily

## 2020-12-23 ENCOUNTER — Telehealth: Payer: Self-pay | Admitting: Internal Medicine

## 2020-12-23 NOTE — Telephone Encounter (Signed)
Patient blood sugar readings have been low since 12/19/20. Last reading was 68 which was taken this morning. She has follow the instruction to take it only the glimepiride (AMARYL) 2 MG tablet in the AM.   Please advise and call back at [phone]

## 2020-12-23 NOTE — Telephone Encounter (Signed)
I called the patient back to advise her of what Dr. Jenny Reichmann instructed. She just took her CBS and it was 96.

## 2020-12-23 NOTE — Telephone Encounter (Signed)
Patient notified and verbalizes understanding.

## 2020-12-23 NOTE — Telephone Encounter (Signed)
Please if she can, please check CBGs before each meal and also bedtime for 3 days, and call with results (or mychart message)

## 2020-12-27 ENCOUNTER — Telehealth: Payer: Self-pay | Admitting: Internal Medicine

## 2020-12-27 NOTE — Telephone Encounter (Signed)
Patient calling, states she was told to call back about her sugars, the lowest she has been is 70 and the highest she got was 217. She also didn't take any medicine on Tuesday and it was 180.  Patient also wondering if Dr. Jenny Reichmann would send her medication for anxiety.

## 2020-12-27 NOTE — Telephone Encounter (Signed)
Ok to let the patient know -   We should change the glimeparide 2 mg to say ONLY take for glucose > 200  Ok to continue all other medication, including metformin as is

## 2020-12-30 NOTE — Telephone Encounter (Signed)
Patient verbally understood last message by Dr. Jenny Reichmann.

## 2021-01-06 ENCOUNTER — Other Ambulatory Visit: Payer: Self-pay | Admitting: Internal Medicine

## 2021-01-06 NOTE — Telephone Encounter (Signed)
Please refill as per office routine med refill policy (all routine meds refilled for 3 mo or monthly per pt preference up to one year from last visit, then month to month grace period for 3 mo, then further med refills will have to be denied)  

## 2021-01-08 ENCOUNTER — Ambulatory Visit: Payer: Medicare Other | Attending: Internal Medicine

## 2021-01-08 DIAGNOSIS — Z23 Encounter for immunization: Secondary | ICD-10-CM

## 2021-01-08 NOTE — Progress Notes (Signed)
   Covid-19 Vaccination Clinic  Name:  Cindy Larson    MRN: [id number] DOB: 04-Jan-1946  01/08/2021  [name] was observed post Covid-19 immunization for 15 minutes without incident. She was provided with Vaccine Information Sheet and instruction to access the V-Safe system.   [name] was instructed to call 911 with any severe reactions post vaccine: Marland Kitchen Difficulty breathing  . Swelling of face and throat  . A fast heartbeat  . A bad rash all over body  . Dizziness and weakness   Immunizations Administered    Name Date Dose VIS Date Route   PFIZER Comrnaty(Gray TOP) Covid-19 Vaccine 01/08/2021  9:53 AM 0.3 mL 08/08/2020 Intramuscular   Manufacturer: [employer]   Lot: [id number]   NDC: [phone]     bs

## 2021-01-09 ENCOUNTER — Other Ambulatory Visit: Payer: Self-pay | Admitting: Internal Medicine

## 2021-01-09 ENCOUNTER — Other Ambulatory Visit (HOSPITAL_COMMUNITY): Payer: Self-pay

## 2021-01-09 MED ORDER — COVID-19 MRNA VAC-TRIS(PFIZER) 30 MCG/0.3ML IM SUSP
INTRAMUSCULAR | 0 refills | Status: DC
  Filled 2021-01-09: qty 0.3, 1d supply, fill #0

## 2021-01-09 NOTE — Telephone Encounter (Signed)
Please refill as per office routine med refill policy (all routine meds refilled for 3 mo or monthly per pt preference up to one year from last visit, then month to month grace period for 3 mo, then further med refills will have to be denied)  

## 2021-01-10 ENCOUNTER — Other Ambulatory Visit: Payer: Self-pay | Admitting: Internal Medicine

## 2021-01-10 DIAGNOSIS — R069 Unspecified abnormalities of breathing: Secondary | ICD-10-CM | POA: Diagnosis not present

## 2021-01-10 DIAGNOSIS — R0902 Hypoxemia: Secondary | ICD-10-CM | POA: Diagnosis not present

## 2021-01-10 DIAGNOSIS — R000 Tachycardia, unspecified: Secondary | ICD-10-CM | POA: Diagnosis not present

## 2021-01-10 DIAGNOSIS — Z743 Need for continuous supervision: Secondary | ICD-10-CM | POA: Diagnosis not present

## 2021-01-10 DIAGNOSIS — R0602 Shortness of breath: Secondary | ICD-10-CM | POA: Diagnosis not present

## 2021-01-10 NOTE — Telephone Encounter (Signed)
Please refill as per office routine med refill policy (all routine meds refilled for 3 mo or monthly per pt preference up to one year from last visit, then month to month grace period for 3 mo, then further med refills will have to be denied)  

## 2021-01-13 ENCOUNTER — Other Ambulatory Visit (HOSPITAL_COMMUNITY): Payer: Self-pay

## 2021-01-22 ENCOUNTER — Encounter: Payer: Self-pay | Admitting: Adult Health

## 2021-01-22 ENCOUNTER — Other Ambulatory Visit: Payer: Self-pay

## 2021-01-22 ENCOUNTER — Ambulatory Visit: Payer: Medicare Other | Admitting: Adult Health

## 2021-01-22 VITALS — BP 110/73 | HR 99 | Ht 59.0 in | Wt 168.0 lb

## 2021-01-22 DIAGNOSIS — I10 Essential (primary) hypertension: Secondary | ICD-10-CM | POA: Diagnosis not present

## 2021-01-22 DIAGNOSIS — G459 Transient cerebral ischemic attack, unspecified: Secondary | ICD-10-CM | POA: Diagnosis not present

## 2021-01-22 DIAGNOSIS — E782 Mixed hyperlipidemia: Secondary | ICD-10-CM | POA: Diagnosis not present

## 2021-01-22 DIAGNOSIS — E11649 Type 2 diabetes mellitus with hypoglycemia without coma: Secondary | ICD-10-CM

## 2021-01-22 NOTE — Progress Notes (Signed)
[facility] [address] [place]. [place] [address] [phone]       HOSPITAL FOLLOW UP NOTE  [name] Date of Birth:  [birth year] Medical Record Number:  [id number]   Reason for Referral:  Hospital follow up    SUBJECTIVE:   CHIEF COMPLAINT:  Chief Complaint  Patient presents with  . Follow-up    Tr with daughter Kenyon Ana) Pt is well. No complaints      HPI:   Cindy Larson is a 75 year old female with history of diabetes, hypertension, hyperlipidemia, obesity, vertigo admitted on 12/13/2020 for episode of left lip and tongue numbness and left leg weakness as well as slurred speech as well as hypoglycemic.  Symptoms gradually improved and resolved in 2 hours.  CT no acute abnormality.  MRI no acute infarct, MRA head and neck unremarkable.  EF 50 to 55%.  LDL 54, A1c 6.0 (down from 6.9). LE venous Doppler negative for DVT.  Concern of possible TIA vs hypoglycemic event.  Recommended DAPT for 3 weeks and Plavix alone as well as continuation of atorvastatin.  Evaluated by therapy and discharged home in stable condition without therapy needs.  Today, 01/22/2021, Cindy Larson is being seen for hospital accompanied by her daughter.  She reports she has been doing well since discharge without new or reoccurring stroke/TIA symptoms.  She has remained on aspirin and Plavix despite 3-week recommendation but denies bleeding or bruising.  Remains on atorvastatin 20 mg daily without myalgias.  Blood pressure today 110/73.  Routinely monitors at home and has been stable.  Monitors glucose levels at home which have been stable since diabetic regimen adjustments by PCP and denies any additional hypoglycemic events.  No concerns at this time.       ROS:   14 system review of systems performed and negative with exception of no complaints  PMH:  Past Medical History:  Diagnosis Date  . ALLERGIC RHINITIS   . ANXIETY   . COLONIC POLYPS, HX OF    . DEPRESSION   . DIABETES MELLITUS, TYPE II   . Diverticulitis   . DIVERTICULOSIS, COLON   . Esophageal stricture   . Gallstones   . GERD   . HIATAL HERNIA   . History of blood transfusion    left knee replacement  . HYPERLIPIDEMIA   . Hypersomnolence   . HYPERTENSION   . INTERMITTENT VERTIGO   . OBESITY   . OSTEOARTHRITIS    L knee  . OSTEOPENIA   . Pneumonia   . Post-menopausal    hormone replacement therapy    PSH:  Past Surgical History:  Procedure Laterality Date  . ABDOMINAL HYSTERECTOMY    . CESAREAN SECTION    . CHOLECYSTECTOMY    . COLONOSCOPY  03/02/2017   [name]  . Lysis of Adhesions    . OOPHORECTOMY     one  . POLYPECTOMY    . ROTATOR CUFF REPAIR Left   . TOTAL KNEE ARTHROPLASTY Left     Social History:  Social History   Socioeconomic History  . Marital status: Married    Spouse name: Not on file  . Number of children: 3  . Years of education: Not on file  . Highest education level: Not on file  Occupational History  . Occupation: Control and instrumentation engineer  Tobacco Use  . Smoking status: Never Smoker  . Smokeless tobacco: Never Used  Vaping Use  . Vaping Use: Never used  Substance and Sexual Activity  . Alcohol  use: No  . Drug use: No  . Sexual activity: Not on file  Other Topics Concern  . Not on file  Social History Narrative  . Not on file   Social Determinants of Health   Financial Resource Strain: Not on file  Food Insecurity: Not on file  Transportation Needs: Not on file  Physical Activity: Not on file  Stress: Not on file  Social Connections: Not on file  Intimate Partner Violence: Not on file    Family History:  Family History  Problem Relation Age of Onset  . Hypertension Mother   . Atrial fibrillation Mother   . Heart disease Mother        CHF  . Cancer Mother        Cancer in "thigh"  . Colon cancer Paternal Aunt        originated as breast CA  . Breast cancer Paternal Aunt   . Lung cancer Brother        smoker  .  Diabetes Other        paternal Aunt and uncle  . Breast cancer Maternal Grandmother   . Ovarian cancer Sister   . Heart failure Brother        CHF  . Kidney disease Maternal Aunt   . Esophageal cancer Neg Hx   . Rectal cancer Neg Hx   . Stomach cancer Neg Hx   . Colon polyps Neg Hx     Medications:   Current Outpatient Medications on File Prior to Visit  Medication Sig Dispense Refill  . acetaminophen (TYLENOL) 500 MG tablet Take 500-1,000 mg by mouth every 8 (eight) hours as needed (for pain).    Marland Kitchen ALPRAZolam (XANAX) 0.25 MG tablet Take 1 tablet by mouth twice daily as needed for anxiety (Patient taking differently: Take 0.25 mg by mouth 2 (two) times daily as needed for anxiety.) 60 tablet 5  . atorvastatin (LIPITOR) 20 MG tablet Take 1 tablet by mouth once daily (Patient taking differently: Take 20 mg by mouth at bedtime.) 90 tablet 1  . Blood Glucose Monitoring Suppl (ACCU-CHEK GUIDE ME) w/Device KIT Use as directed four times per day E11.9 1 kit 0  . Cholecalciferol (VITAMIN D3) 50 MCG (2000 UT) TABS Take 2,000 Units by mouth daily.    . citalopram (CELEXA) 10 MG tablet Take 1 tablet (10 mg total) by mouth daily. 90 tablet 3  . clopidogrel (PLAVIX) 75 MG tablet Take 1 tablet (75 mg total) by mouth daily. 30 tablet 1  . COVID-19 mRNA Vac-TriS, Pfizer, SUSP injection Inject into the muscle. 0.3 mL 0  . fexofenadine (ALLEGRA) 180 MG tablet Take 1 tablet (180 mg total) by mouth daily. 30 tablet 2  . glimepiride (AMARYL) 2 MG tablet Take 1 tablet (2 mg total) by mouth daily before breakfast. 90 tablet 3  . glucose blood (ACCU-CHEK GUIDE) test strip Use as instructed four times per day  E11.9 400 each 12  . guaiFENesin (MUCINEX) 600 MG 12 hr tablet Take 2 tablets (1,200 mg total) by mouth 2 (two) times daily as needed. (Patient taking differently: Take 1,200 mg by mouth 2 (two) times daily as needed for to loosen phlegm.) 60 tablet 1  . Lancets MISC Use as directed four time per day E11.9  400 each 11  . lisinopril (ZESTRIL) 5 MG tablet TAKE 1 TABLET BY MOUTH ONCE DAILY . APPOINTMENT REQUIRED FOR FUTURE REFILLS 30 tablet 0  . meclizine (ANTIVERT) 25 MG tablet TAKE ONE TABLET BY  MOUTH EVERY 6 HOURS AS NEEDED FOR DIZZINESS (Patient taking differently: Take 25 mg by mouth every 6 (six) hours as needed for dizziness.) 30 tablet 2  . metFORMIN (GLUCOPHAGE) 1000 MG tablet Take 1 tablet (1,000 mg total) by mouth 2 (two) times daily with a meal. 180 tablet 3  . Multiple Vitamins-Minerals (ALIVE WOMENS GUMMY) CHEW Chew 1 tablet by mouth daily.    Marland Kitchen omeprazole (PRILOSEC) 20 MG capsule TAKE 1 CAPSULE BY MOUTH TWICE DAILY BEFORE MEAL(S) 180 capsule 0  . PARoxetine (PAXIL) 20 MG tablet Take 1 tablet (20 mg total) by mouth daily. 90 tablet 3  . Polyethyl Glycol-Propyl Glycol (SYSTANE OP) Place 1 drop into both eyes in the morning and at bedtime.    . promethazine (PHENERGAN) 25 MG tablet TAKE ONE TABLET BY MOUTH EVERY 6 HOURS AS NEEDED FOR NAUSEA (Patient taking differently: Take 25 mg by mouth every 6 (six) hours as needed for nausea.) 40 tablet 0  . traMADol (ULTRAM) 50 MG tablet Take 1 tablet (50 mg total) by mouth every 12 (twelve) hours as needed. 30 tablet 0  . triamcinolone (NASACORT) 55 MCG/ACT AERO nasal inhaler Place 2 sprays into the nose daily. (Patient taking differently: Place 2 sprays into the nose daily as needed (for allergies or sinus issues).) 1 Inhaler 12   No current facility-administered medications on file prior to visit.    Allergies:   Allergies  Allergen Reactions  . Aspirin Nausea Only and Other (See Comments)    Made the stomach "hurt"  . Fexofenadine Other (See Comments)    "This makes me feel badly. I didn't feel well when I took it"  . Nsaids Nausea Only and Other (See Comments)    The patient CAN tolerate Tramadol and Tylenol- no Aleve or Motrin, though  . Penicillins Hives and Other (See Comments)    Blisters, also  . Prevnar [Pneumococcal 13-Val Conj Vacc]  Swelling and Other (See Comments)    Left arm became swollen and welts appeared  . Zocor [Simvastatin] Itching      OBJECTIVE:  Physical Exam  Vitals:   01/22/21 1509  BP: 110/73  Pulse: 99  Weight: 168 lb (76.2 kg)  Height: $Remove'4\' 11"'eWyhADV$  (1.499 m)   Body mass index is 33.93 kg/m. No exam data present  General: well developed, well nourished,  very pleasant elderly African-American female, seated, in no evident distress Head: head normocephalic and atraumatic.   Neck: supple with no carotid or supraclavicular bruits Cardiovascular: regular rate and rhythm, no murmurs Musculoskeletal: no deformity Skin:  no rash/petichiae Vascular:  Normal pulses all extremities   Neurologic Exam Mental Status: Awake and fully alert.   Fluent speech and language.  Oriented to place and time. Recent and remote memory intact. Attention span, concentration and fund of knowledge appropriate. Mood and affect appropriate.  Cranial Nerves: Pupils equal, briskly reactive to light. Extraocular movements full without nystagmus. Visual fields full to confrontation. Hearing intact. Facial sensation intact. Face, tongue, palate moves normally and symmetrically.  Motor: Normal bulk and tone. Normal strength in all tested extremity muscles Sensory.: intact to touch , pinprick , position and vibratory sensation.  Coordination: Rapid alternating movements normal in all extremities. Finger-to-nose and heel-to-shin performed accurately bilaterally. Gait and Station: Arises from chair without difficulty. Stance is normal. Gait demonstrates normal stride length and balance without use of assistive device. Tandem walk and heel toe moderate difficulty.  Reflexes: 1+ and symmetric. Toes downgoing.     NIHSS  0 Modified  Rankin  0      ASSESSMENT: Cindy Larson is a 75 y.o. year old female presented with left-sided numbness/tingling around her face, lips and leg as well as dysarthria on 01/11/2021 possibly TIA  vs hypoglycemic event. Vascular risk factors include DM, HTN, HLD.      PLAN:  1. TIA : Continue clopidogrel 75 mg daily  and atorvastatin 20 mg daily for secondary stroke prevention.  Patient request refill on Plavix which will be sent to her pharmacy but request ongoing refills provided by PCP.  Advised to discontinue aspirin as 3 weeks DAPT completed.  Discussed secondary stroke prevention measures and importance of close PCP follow up for aggressive stroke risk factor management  2. HTN: BP goal <130/90.  Stable on current regimen per PCP 3. HLD: LDL goal <70. Recent LDL 54 on atorvastatin 20 mg daily per PCP..  4. DMII: A1c goal<7.0. Recent A1c 6.0.  Recent medication adjustments by PCP.  Denies any additional hypoglycemic events    Follow up in 6 months or call earlier if needed   CC:  [facility] provider: Dr. Leonie Man PCP: Biagio Borg, MD    I spent 43 minutes of face-to-face and non-face-to-face time with patient and daughter.  This included previsit chart review including recent hospitalization pertinent progress notes, lab work and imaging, lab review, study review, electronic health record documentation, and extensive discussion with patient and daughter regarding recent event possibly TIA versus hypoglycemic event, education on secondary stroke prevention measures and importance of managing stroke risk factors as well as compliance with all above medications and answered all other questions to patient and daughters satisfaction  Frann Rider, AGNP-BC  [facility] [address] [place], [place] [address]  Phone [phone] Fax [phone] Note: This document was prepared with digital dictation and possible smart phrase technology. Any transcriptional errors that result from this process are unintentional.

## 2021-01-22 NOTE — Patient Instructions (Addendum)
Continue clopidogrel 75 mg daily  and atorvastatin  for secondary stroke prevention  Stop aspirin as 3 weeks of aspirin and plavix together are completed  Continue to follow up with PCP regarding cholesterol, blood pressure and diabetes management  Maintain strict control of hypertension with blood pressure goal below 130/90, diabetes with hemoglobin A1c goal below 7% and cholesterol with LDL cholesterol (bad cholesterol) goal below 70 mg/dL.       Followup in the future with me in 6 months or call earlier if needed       Thank you for coming to see Cindy Larson at [facility]. I hope we have been able to provide you high quality care today.  You may receive a patient satisfaction survey over the next few weeks. We would appreciate your feedback and comments so that we may continue to improve ourselves and the health of our patients.    Transient Ischemic Attack A transient ischemic attack (TIA) causes stroke-like symptoms that go away quickly. Having a TIA means that a person is at higher risk for a stroke. A TIA happens when blood supply to the brain is blocked temporarily. A TIA is a medical emergency. What are the causes? This condition is caused by a temporary blockage in an artery in the head or neck. This means the brain does not get the blood supply it needs. There is no permanent brain damage with a TIA. A blockage can be caused by:  Fatty buildup in an artery in the head or neck (atherosclerosis).  A blood clot.  An artery tear (dissection).  Inflammation of an artery (vasculitis). Sometimes the cause is not known. What increases the risk? Certain factors may make you more likely to develop this condition. Some of these are things that you can change, such as:  Obesity.  Using products that contain nicotine or tobacco.  Taking oral birth control, especially if you also use tobacco.  Not being active.  Heavy alcohol use.  Drug use, especially  cocaine and methamphetamine. Medical conditions that may increase your risk include:  High blood pressure (hypertension).  High cholesterol.  Diabetes.  Heart disease (coronary artery disease).  An irregular heartbeat, also called atrial fibrillation (AFib).  Sickle cell disease.  Sleep problems (sleep apnea).  Chronic inflammatory diseases, such as rheumatoid arthritis or lupus.  Blood clotting disorders (hypercoagulable state). Other risk factors include:  Being over the age of 30.  Being female.  Family history of stroke.  Previous history of blood clots, stroke, TIA, or heart attack.  Having a history of preeclampsia.  Migraine headache. What are the signs or symptoms? Symptoms of a TIA are the same as those of a stroke. The symptoms develop suddenly, and then go away quickly. They may include:  Weakness or numbness in your face, arm, or leg, especially on one side of your body.  Trouble walking or moving your arms or legs.  Trouble speaking, understanding speech, or both (aphasia).  Vision changes, such as double vision, blurred vision, or loss of vision.  Dizziness.  Confusion.  Loss of balance or coordination.  Nausea and vomiting.  Severe headache. If possible, note what time your symptoms started. Tell your health care provider.   How is this diagnosed? This condition may be diagnosed based on:  Your symptoms and medical history.  A physical exam.  Imaging tests, usually a CT scan or MRI of the brain.  Blood tests. You may also have other tests, including:  Electrocardiogram (ECG).  Echocardiogram.  Carotid ultrasound.  A scan of blood circulation in the brain (CT angiogram or MR angiogram).  Continuous heart monitoring. How is this treated? The goal of treatment is to reduce the risk for a stroke. Stroke prevention therapies may include:  Changes to diet and lifestyle, such as being physically active and stopping  smoking.  Medicines to thin the blood (antiplatelets or anticoagulants).  Blood pressure medicines.  Medicines to reduce cholesterol.  Treating other health conditions, such as diabetes or AFib. If testing shows a narrowing in the arteries to your brain, your health care provider may recommend a procedure, such as:  Carotid endarterectomy. This is done to remove the blockage from your artery.  Carotid angioplasty and stenting. This uses a tube (stent) to open or widen an artery in the neck. The stent helps keep the artery open by supporting the artery walls. Follow these instructions at home: Medicines  Take over-the-counter and prescription medicines only as told by your health care provider.  If you were told to take a medicine to thin your blood, such as aspirin or an anticoagulant, take it exactly as told by your health care provider. ? Taking too much blood-thinning medicine can cause bleeding. Taking too little will not protect you against a stroke and other problems. Eating and drinking  Eat 5 or more servings of fruits and vegetables each day.  Follow guidelines from your health care provider about your diet. You may need to follow a certain diet to help manage risk factors for stroke. This may include: ? Eating a low-fat, low-salt diet. ? Choosing high-fiber foods. ? Limiting carbohydrates and sugar.  If you drink alcohol: ? Limit how much you have to:  0-1 drink a day for women who are not pregnant.  0-2 drinks a day for men. ? Know how much alcohol is in a drink. In the [place] one drink equals one 12 oz bottle of beer (371mL), one 5 oz glass of wine (133mL), or one 1 oz glass of hard liquor (81mL).   General instructions  Maintain a healthy weight.  Try to get at least 30 minutes of exercise on most days.  Get treatment if you have sleep apnea.  Do not use any products that contain nicotine or tobacco. These products include cigarettes, chewing tobacco, and  vaping devices, such as e-cigarettes. If you need help quitting, ask your health care provider.  Do not use drugs.  Keep all follow-up visits. This is important. Where to find more information  American Stroke Association: www.stroke.org Get help right away if:  You have chest pain or an irregular heartbeat.  You have any symptoms of a stroke. "BE FAST" is an easy way to remember the main warning signs of a stroke. ? B - Balance. Signs are dizziness, sudden trouble walking, or loss of balance. ? E - Eyes. Signs are trouble seeing or a sudden change in vision. ? F - Face. Signs are sudden weakness or numbness of the face, or the face or eyelid drooping on one side. ? A - Arms. Signs are weakness or numbness in an arm. This happens suddenly and usually on one side of the body. ? S - Speech. Signs are sudden trouble speaking, slurred speech, or trouble understanding what people say. ? T - Time. Time to call emergency services. Write down what time symptoms started.  You have other signs of a stroke, such as: ? A sudden, severe headache with no known cause. ? Nausea or  vomiting. ? Seizure. These symptoms may represent a serious problem that is an emergency. Do not wait to see if the symptoms will go away. Get medical help right away. Call your local emergency services (911 in the [place] Do not drive yourself to the hospital. Summary  A transient ischemic attack (TIA) happens when an artery in the head or neck is blocked. The blockage clears before there is any permanent brain damage. A TIA is a medical emergency.  Symptoms of a TIA are the same as those of a stroke. The symptoms develop suddenly, and then go away quickly.  Having a TIA means that you are at higher risk for a stroke.  The goal of treatment is to reduce your risk for a stroke. Treatment may include medicines to thin the blood and changes to diet and lifestyle. This information is not intended to replace advice given to  you by your health care provider. Make sure you discuss any questions you have with your health care provider. Document Revised: 03/12/2020 Document Reviewed: 03/12/2020 Elsevier Patient Education  [place].

## 2021-01-23 NOTE — Progress Notes (Signed)
I agree with the above plan 

## 2021-01-24 ENCOUNTER — Other Ambulatory Visit: Payer: Self-pay | Admitting: Internal Medicine

## 2021-01-30 ENCOUNTER — Telehealth: Payer: Self-pay | Admitting: Internal Medicine

## 2021-01-30 NOTE — Telephone Encounter (Signed)
LV with patients husband  to rtn my call to schedule AWV with [facility]

## 2021-02-10 ENCOUNTER — Other Ambulatory Visit: Payer: Self-pay | Admitting: Internal Medicine

## 2021-02-10 ENCOUNTER — Other Ambulatory Visit: Payer: Self-pay | Admitting: Family Medicine

## 2021-02-10 NOTE — Telephone Encounter (Signed)
Please refill as per office routine med refill policy (all routine meds refilled for 3 mo or monthly per pt preference up to one year from last visit, then month to month grace period for 3 mo, then further med refills will have to be denied)  

## 2021-02-12 ENCOUNTER — Other Ambulatory Visit: Payer: Self-pay | Admitting: Family Medicine

## 2021-02-12 ENCOUNTER — Other Ambulatory Visit: Payer: Self-pay

## 2021-02-13 ENCOUNTER — Other Ambulatory Visit: Payer: Self-pay | Admitting: Family Medicine

## 2021-02-14 ENCOUNTER — Telehealth: Payer: Self-pay | Admitting: Adult Health

## 2021-02-14 NOTE — Telephone Encounter (Signed)
Pt called needing a refill on her clopidogrel (PLAVIX) 75 MG tablet sent in to the [facility] on [address] would like to know if she can get this for a 90 day qt. Please advise.

## 2021-02-17 MED ORDER — CLOPIDOGREL BISULFATE 75 MG PO TABS: 75.0000 mg | ORAL_TABLET | Freq: Every day | ORAL | 0 refills | Status: DC

## 2021-02-17 NOTE — Addendum Note (Signed)
Addended by: Wyvonnia Lora on: 02/17/2021 09:39 AM   Modules accepted: Orders

## 2021-02-17 NOTE — Telephone Encounter (Signed)
Called pt back. Advised we will send 90 days supply refill for plavix for her. Her next follow up is 07/30/20. She verbalized understanding and appreciation.

## 2021-03-03 ENCOUNTER — Encounter (HOSPITAL_COMMUNITY): Payer: Self-pay | Admitting: *Deleted

## 2021-03-03 ENCOUNTER — Ambulatory Visit (INDEPENDENT_AMBULATORY_CARE_PROVIDER_SITE_OTHER): Payer: Medicare Other

## 2021-03-03 ENCOUNTER — Ambulatory Visit (HOSPITAL_COMMUNITY)
Admission: EM | Admit: 2021-03-03 | Discharge: 2021-03-03 | Disposition: A | Discharge status: Home / Self Care | Payer: Medicare Other | Attending: Urgent Care | Admitting: Urgent Care

## 2021-03-03 ENCOUNTER — Other Ambulatory Visit: Payer: Self-pay

## 2021-03-03 DIAGNOSIS — J3089 Other allergic rhinitis: Secondary | ICD-10-CM

## 2021-03-03 DIAGNOSIS — J069 Acute upper respiratory infection, unspecified: Secondary | ICD-10-CM

## 2021-03-03 DIAGNOSIS — R059 Cough, unspecified: Secondary | ICD-10-CM | POA: Diagnosis not present

## 2021-03-03 DIAGNOSIS — J029 Acute pharyngitis, unspecified: Secondary | ICD-10-CM | POA: Diagnosis not present

## 2021-03-03 DIAGNOSIS — R0981 Nasal congestion: Secondary | ICD-10-CM

## 2021-03-03 MED ORDER — FLUTICASONE PROPIONATE 50 MCG/ACT NA SUSP: 2.0000 | Freq: Every day | NASAL | 0 refills | Status: AC

## 2021-03-03 MED ORDER — CETIRIZINE HCL 5 MG PO TABS: 5.0000 mg | ORAL_TABLET | Freq: Every day | ORAL | 0 refills | Status: DC

## 2021-03-03 NOTE — ED Provider Notes (Signed)
Cindy Larson   MRN: [id number] DOB: 31-Aug-1946  Subjective:   Cindy Larson is a 75 y.o. female presenting for 4 day history of acute onset sinus congestion, sinus pressure, dry hacking cough, post-nasal drainage and throat pain. Reports that her family members said she was wheezing over night. Denies fever, chest pain, shob, body aches. Does not want a COVID 19 test. Denies history of asthma.  She does have a history of allergic rhinitis, uses Nasacort intermittently but not consistently.  Does not take an oral antihistamine.  No current facility-administered medications for this encounter.  Current Outpatient Medications:    acetaminophen (TYLENOL) 500 MG tablet, Take 500-1,000 mg by mouth every 8 (eight) hours as needed (for pain)., Disp: , Rfl:    ALPRAZolam (XANAX) 0.25 MG tablet, Take 1 tablet by mouth twice daily as needed for anxiety, Disp: 60 tablet, Rfl: 5   atorvastatin (LIPITOR) 20 MG tablet, Take 1 tablet by mouth once daily (Patient taking differently: Take 20 mg by mouth at bedtime.), Disp: 90 tablet, Rfl: 1   Blood Glucose Monitoring Suppl (ACCU-CHEK GUIDE ME) w/Device KIT, Use as directed four times per day E11.9, Disp: 1 kit, Rfl: 0   Cholecalciferol (VITAMIN D3) 50 MCG (2000 UT) TABS, Take 2,000 Units by mouth daily., Disp: , Rfl:    citalopram (CELEXA) 10 MG tablet, Take 1 tablet (10 mg total) by mouth daily., Disp: 90 tablet, Rfl: 3   clopidogrel (PLAVIX) 75 MG tablet, Take 1 tablet (75 mg total) by mouth daily., Disp: 90 tablet, Rfl: 0   COVID-19 mRNA Vac-TriS, Pfizer, SUSP injection, Inject into the muscle., Disp: 0.3 mL, Rfl: 0   fexofenadine (ALLEGRA) 180 MG tablet, Take 1 tablet (180 mg total) by mouth daily., Disp: 30 tablet, Rfl: 2   glimepiride (AMARYL) 2 MG tablet, Take 1 tablet (2 mg total) by mouth daily before breakfast., Disp: 90 tablet, Rfl: 3   glucose blood (ACCU-CHEK GUIDE) test strip, Use as instructed four times per day   E11.9, Disp: 400 each, Rfl: 12   guaiFENesin (MUCINEX) 600 MG 12 hr tablet, Take 2 tablets (1,200 mg total) by mouth 2 (two) times daily as needed. (Patient taking differently: Take 1,200 mg by mouth 2 (two) times daily as needed for to loosen phlegm.), Disp: 60 tablet, Rfl: 1   Lancets MISC, Use as directed four time per day E11.9, Disp: 400 each, Rfl: 11   lisinopril (ZESTRIL) 5 MG tablet, TAKE 1 TABLET BY MOUTH ONCE DAILY, Disp: 90 tablet, Rfl: 3   meclizine (ANTIVERT) 25 MG tablet, TAKE ONE TABLET BY MOUTH EVERY 6 HOURS AS NEEDED FOR DIZZINESS (Patient taking differently: Take 25 mg by mouth every 6 (six) hours as needed for dizziness.), Disp: 30 tablet, Rfl: 2   metFORMIN (GLUCOPHAGE) 1000 MG tablet, Take 1 tablet (1,000 mg total) by mouth 2 (two) times daily with a meal., Disp: 180 tablet, Rfl: 3   Multiple Vitamins-Minerals (ALIVE WOMENS GUMMY) CHEW, Chew 1 tablet by mouth daily., Disp: , Rfl:    omeprazole (PRILOSEC) 20 MG capsule, TAKE 1 CAPSULE BY MOUTH TWICE DAILY BEFORE MEAL(S), Disp: 180 capsule, Rfl: 0   PARoxetine (PAXIL) 20 MG tablet, Take 1 tablet (20 mg total) by mouth daily., Disp: 90 tablet, Rfl: 3   Polyethyl Glycol-Propyl Glycol (SYSTANE OP), Place 1 drop into both eyes in the morning and at bedtime., Disp: , Rfl:    promethazine (PHENERGAN) 25 MG tablet, TAKE ONE TABLET BY MOUTH EVERY 6  HOURS AS NEEDED FOR NAUSEA (Patient taking differently: Take 25 mg by mouth every 6 (six) hours as needed for nausea.), Disp: 40 tablet, Rfl: 0   traMADol (ULTRAM) 50 MG tablet, Take 1 tablet (50 mg total) by mouth every 12 (twelve) hours as needed., Disp: 30 tablet, Rfl: 0   triamcinolone (NASACORT) 55 MCG/ACT AERO nasal inhaler, Place 2 sprays into the nose daily. (Patient taking differently: Place 2 sprays into the nose daily as needed (for allergies or sinus issues).), Disp: 1 Inhaler, Rfl: 12   Allergies  Allergen Reactions   Aspirin Nausea Only and Other (See Comments)    Made the stomach  "hurt"   Fexofenadine Other (See Comments)    "This makes me feel badly. I didn't feel well when I took it"   Nsaids Nausea Only and Other (See Comments)    The patient CAN tolerate Tramadol and Tylenol- no Aleve or Motrin, though   Penicillins Hives and Other (See Comments)    Blisters, also   Prevnar [Pneumococcal 13-Val Conj Vacc] Swelling and Other (See Comments)    Left arm became swollen and welts appeared   Zocor [Simvastatin] Itching    Past Medical History:  Diagnosis Date   ALLERGIC RHINITIS    ANXIETY    COLONIC POLYPS, HX OF    DEPRESSION    DIABETES MELLITUS, TYPE II    Diverticulitis    DIVERTICULOSIS, COLON    Esophageal stricture    Gallstones    GERD    HIATAL HERNIA    History of blood transfusion    left knee replacement   HYPERLIPIDEMIA    Hypersomnolence    HYPERTENSION    INTERMITTENT VERTIGO    OBESITY    OSTEOARTHRITIS    L knee   OSTEOPENIA    Pneumonia    Post-menopausal    hormone replacement therapy     Past Surgical History:  Procedure Laterality Date   ABDOMINAL HYSTERECTOMY     CESAREAN SECTION     CHOLECYSTECTOMY     COLONOSCOPY  03/02/2017   Marina Goodell   Lysis of Adhesions     OOPHORECTOMY     one   POLYPECTOMY     ROTATOR CUFF REPAIR Left    TOTAL KNEE ARTHROPLASTY Left     Family History  Problem Relation Age of Onset   Hypertension Mother    Atrial fibrillation Mother    Heart disease Mother        CHF   Cancer Mother        Cancer in "thigh"   Ovarian cancer Sister    Lung cancer Brother        smoker   Heart failure Brother        CHF   Breast cancer Maternal Grandmother    Kidney disease Maternal Aunt    Colon cancer Paternal Aunt        originated as breast CA   Breast cancer Paternal Aunt    Diabetes Other        paternal Aunt and uncle   Esophageal cancer Neg Hx    Rectal cancer Neg Hx    Stomach cancer Neg Hx    Colon polyps Neg Hx     Social History   Tobacco Use   Smoking status: Never    Smokeless tobacco: Never  Vaping Use   Vaping Use: Never used  Substance Use Topics   Alcohol use: No   Drug use: No    ROS  Objective:   Vitals: BP 132/67   Pulse 83   Temp 98.8 F (37.1 C) (Oral)   Resp 20   SpO2 100%   Physical Exam Constitutional:      General: She is not in acute distress.    Appearance: Normal appearance. She is well-developed. She is not ill-appearing, toxic-appearing or diaphoretic.  HENT:     Head: Normocephalic and atraumatic.     Right Ear: Tympanic membrane and ear canal normal. No drainage or tenderness. No middle ear effusion. Tympanic membrane is not erythematous.     Left Ear: Tympanic membrane and ear canal normal. No drainage or tenderness.  No middle ear effusion. Tympanic membrane is not erythematous.     Nose: Congestion present. No rhinorrhea.     Mouth/Throat:     Mouth: Mucous membranes are moist. No oral lesions.     Pharynx: No pharyngeal swelling, oropharyngeal exudate, posterior oropharyngeal erythema or uvula swelling.     Tonsils: No tonsillar exudate or tonsillar abscesses.     Comments: Thick streaks of post-nasal drainage overlying her throat.  Eyes:     Extraocular Movements: Extraocular movements intact.     Right eye: Normal extraocular motion.     Left eye: Normal extraocular motion.     Conjunctiva/sclera: Conjunctivae normal.     Pupils: Pupils are equal, round, and reactive to light.  Cardiovascular:     Rate and Rhythm: Normal rate and regular rhythm.     Pulses: Normal pulses.     Heart sounds: Normal heart sounds. No murmur heard.   No friction rub. No gallop.  Pulmonary:     Effort: Pulmonary effort is normal. No respiratory distress.     Breath sounds: Normal breath sounds. No stridor. No wheezing, rhonchi or rales.  Musculoskeletal:     Cervical back: Normal range of motion and neck supple.  Lymphadenopathy:     Cervical: No cervical adenopathy.  Skin:    General: Skin is warm and dry.     Findings:  No rash.  Neurological:     General: No focal deficit present.     Mental Status: She is alert and oriented to person, place, and time.  Psychiatric:        Mood and Affect: Mood normal.        Behavior: Behavior normal.        Thought Content: Thought content normal.        Judgment: Judgment normal.    DG Chest 2 View  Result Date: 03/03/2021 CLINICAL DATA:  Cough and sore throat. EXAM: CHEST - 2 VIEW COMPARISON:  12/13/2020 FINDINGS: The lungs are clear without focal pneumonia, edema, pneumothorax or pleural effusion. The cardiopericardial silhouette is within normal limits for size. The visualized bony structures of the thorax show no acute abnormality. IMPRESSION: No active cardiopulmonary disease. Electronically Signed   By: Misty Stanley M.D.   On: 03/03/2021 18:21     Assessment and Plan :   PDMP not reviewed this encounter.  1. Viral URI with cough   2. Allergic rhinitis due to other allergic trigger, unspecified seasonality   3. Nasal congestion     Will manage for viral illness such as viral URI, viral syndrome, viral rhinitis. Recommended supportive care. Patient refused COVID 19 test. Counseled patient on potential for adverse effects with medications prescribed/recommended today, ER and return-to-clinic precautions discussed, patient verbalized understanding.     Jaynee Eagles, [place] 03/03/21 [phone]

## 2021-03-03 NOTE — ED Triage Notes (Signed)
Pt reports Sx's started on Friday.

## 2021-03-11 ENCOUNTER — Telehealth (INDEPENDENT_AMBULATORY_CARE_PROVIDER_SITE_OTHER): Payer: Medicare Other | Admitting: Family Medicine

## 2021-03-11 ENCOUNTER — Encounter: Payer: Self-pay | Admitting: Family Medicine

## 2021-03-11 ENCOUNTER — Other Ambulatory Visit: Payer: Self-pay

## 2021-03-11 VITALS — Temp 99.9°F

## 2021-03-11 DIAGNOSIS — U071 COVID-19: Secondary | ICD-10-CM | POA: Diagnosis not present

## 2021-03-11 DIAGNOSIS — R059 Cough, unspecified: Secondary | ICD-10-CM

## 2021-03-11 DIAGNOSIS — R0981 Nasal congestion: Secondary | ICD-10-CM

## 2021-03-11 MED ORDER — BENZONATATE 100 MG PO CAPS: 100.0000 mg | ORAL_CAPSULE | Freq: Three times a day (TID) | ORAL | 0 refills | Status: DC | PRN

## 2021-03-11 MED ORDER — DOXYCYCLINE HYCLATE 100 MG PO TABS: 100.0000 mg | ORAL_TABLET | Freq: Two times a day (BID) | ORAL | 0 refills | Status: DC

## 2021-03-11 NOTE — Patient Instructions (Signed)
  HOME CARE TIPS:  -I sent the medication(s) we discussed to your pharmacy: Meds ordered this encounter  Medications   doxycycline (VIBRA-TABS) 100 MG tablet    Sig: Take 1 tablet (100 mg total) by mouth 2 (two) times daily.    Dispense:  14 tablet    Refill:  0   benzonatate (TESSALON PERLES) 100 MG capsule    Sig: Take 1 capsule (100 mg total) by mouth 3 (three) times daily as needed.    Dispense:  20 capsule    Refill:  0     -can use tylenol or aleve if needed for fevers, aches and pains per instructions  -can use nasal saline a few times per day if you have nasal congestion;  -stay hydrated, drink plenty of fluids and eat small healthy meals - avoid dairy  -can take 1000 IU (50mcg) Vit D3 and 100-500 mg of Vit C daily per instructions  -If the Covid test is positive, check out the CDC website for more information on home care, transmission and treatment for COVID19  -follow up with your doctor in 2-3 days unless improving and feeling better  -stay home while sick  It was nice to meet you today, and I really hope you are feeling better soon. I help [place] out with telemedicine visits on Tuesdays and Thursdays and am available for visits on those days. If you have any concerns or questions following this visit please schedule a follow up visit with your Primary Care doctor or seek care at a local urgent care clinic to avoid delays in care.    Seek in person care or schedule a follow up video visit promptly if your symptoms worsen, new concerns arise or you are not improving with treatment. Call 911 and/or seek emergency care if your symptoms are severe or life threatening.

## 2021-03-11 NOTE — Progress Notes (Signed)
Virtual Visit via Video Note  I connected with Cindy Larson  on 03/11/21 at 11:40 AM EDT by a video enabled telemedicine application and verified that I am speaking with the correct person using two identifiers.  Location patient: home, [place] Location provider:work or home office Persons participating in the virtual visit: patient, provider, patient's husband  I discussed the limitations of evaluation and management by telemedicine and the availability of in person appointments. The patient expressed understanding and agreed to proceed.   HPI:  Acute telemedicine visit for cough: -Onset: about 12 days ago, seen in [facility] on the 4th and CXR was clear, she refused covid testing at the time, however when symptoms persisted she tested at home yesterday and was positive -Symptoms include: sinus congestion, cough, nasal congestion, diarrhea at times -the nasal congestion has turn thick and white -Denies: CP, SOB, NV, inability to eat/drink/get out of bed -Pertinent past medical history:see below -Pertinent medication allergies:  Allergies  Allergen Reactions   Aspirin Nausea Only and Other (See Comments)    Made the stomach "hurt"   Fexofenadine Other (See Comments)    "This makes me feel badly. I didn't feel well when I took it"   Nsaids Nausea Only and Other (See Comments)    The patient CAN tolerate Tramadol and Tylenol- no Aleve or Motrin, though   Penicillins Hives and Other (See Comments)    Blisters, also   Prevnar [Pneumococcal 13-Val Conj Vacc] Swelling and Other (See Comments)    Left arm became swollen and welts appeared   Zocor [Simvastatin] Itching  -COVID-19 vaccine status: vaccinated x2 + 2 boosters, has also had flu and pneumonia vaccines  ROS: See pertinent positives and negatives per HPI.  Past Medical History:  Diagnosis Date   ALLERGIC RHINITIS    ANXIETY    COLONIC POLYPS, HX OF    DEPRESSION    DIABETES MELLITUS, TYPE II    Diverticulitis    DIVERTICULOSIS, COLON     Esophageal stricture    Gallstones    GERD    HIATAL HERNIA    History of blood transfusion    left knee replacement   HYPERLIPIDEMIA    Hypersomnolence    HYPERTENSION    INTERMITTENT VERTIGO    OBESITY    OSTEOARTHRITIS    L knee   OSTEOPENIA    Pneumonia    Post-menopausal    hormone replacement therapy    Past Surgical History:  Procedure Laterality Date   ABDOMINAL HYSTERECTOMY     CESAREAN SECTION     CHOLECYSTECTOMY     COLONOSCOPY  03/02/2017   [name]   Lysis of Adhesions     OOPHORECTOMY     one   POLYPECTOMY     ROTATOR CUFF REPAIR Left    TOTAL KNEE ARTHROPLASTY Left      Current Outpatient Medications:    acetaminophen (TYLENOL) 500 MG tablet, Take 500-1,000 mg by mouth every 8 (eight) hours as needed (for pain)., Disp: , Rfl:    ALPRAZolam (XANAX) 0.25 MG tablet, Take 1 tablet by mouth twice daily as needed for anxiety, Disp: 60 tablet, Rfl: 5   atorvastatin (LIPITOR) 20 MG tablet, Take 1 tablet by mouth once daily (Patient taking differently: Take 20 mg by mouth at bedtime.), Disp: 90 tablet, Rfl: 1   benzonatate (TESSALON PERLES) 100 MG capsule, Take 1 capsule (100 mg total) by mouth 3 (three) times daily as needed., Disp: 20 capsule, Rfl: 0   Blood Glucose Monitoring Suppl (ACCU-CHEK  GUIDE ME) w/Device KIT, Use as directed four times per day E11.9, Disp: 1 kit, Rfl: 0   cetirizine (ZYRTEC) 5 MG tablet, Take 1 tablet (5 mg total) by mouth daily., Disp: 30 tablet, Rfl: 0   Cholecalciferol (VITAMIN D3) 50 MCG (2000 UT) TABS, Take 2,000 Units by mouth daily., Disp: , Rfl:    citalopram (CELEXA) 10 MG tablet, Take 1 tablet (10 mg total) by mouth daily., Disp: 90 tablet, Rfl: 3   clopidogrel (PLAVIX) 75 MG tablet, Take 1 tablet (75 mg total) by mouth daily., Disp: 90 tablet, Rfl: 0   COVID-19 mRNA Vac-TriS, Pfizer, SUSP injection, Inject into the muscle., Disp: 0.3 mL, Rfl: 0   doxycycline (VIBRA-TABS) 100 MG tablet, Take 1 tablet (100 mg total) by mouth 2 (two)  times daily., Disp: 14 tablet, Rfl: 0   fluticasone (FLONASE) 50 MCG/ACT nasal spray, Place 2 sprays into both nostrils daily., Disp: 16 g, Rfl: 0   glimepiride (AMARYL) 2 MG tablet, Take 1 tablet (2 mg total) by mouth daily before breakfast., Disp: 90 tablet, Rfl: 3   glucose blood (ACCU-CHEK GUIDE) test strip, Use as instructed four times per day  E11.9, Disp: 400 each, Rfl: 12   Lancets MISC, Use as directed four time per day E11.9, Disp: 400 each, Rfl: 11   lisinopril (ZESTRIL) 5 MG tablet, TAKE 1 TABLET BY MOUTH ONCE DAILY, Disp: 90 tablet, Rfl: 3   meclizine (ANTIVERT) 25 MG tablet, TAKE ONE TABLET BY MOUTH EVERY 6 HOURS AS NEEDED FOR DIZZINESS (Patient taking differently: Take 25 mg by mouth every 6 (six) hours as needed for dizziness.), Disp: 30 tablet, Rfl: 2   metFORMIN (GLUCOPHAGE) 1000 MG tablet, Take 1 tablet (1,000 mg total) by mouth 2 (two) times daily with a meal., Disp: 180 tablet, Rfl: 3   omeprazole (PRILOSEC) 20 MG capsule, TAKE 1 CAPSULE BY MOUTH TWICE DAILY BEFORE MEAL(S), Disp: 180 capsule, Rfl: 0   PARoxetine (PAXIL) 20 MG tablet, Take 1 tablet (20 mg total) by mouth daily., Disp: 90 tablet, Rfl: 3   Polyethyl Glycol-Propyl Glycol (SYSTANE OP), Place 1 drop into both eyes in the morning and at bedtime., Disp: , Rfl:    promethazine (PHENERGAN) 25 MG tablet, TAKE ONE TABLET BY MOUTH EVERY 6 HOURS AS NEEDED FOR NAUSEA (Patient taking differently: Take 25 mg by mouth every 6 (six) hours as needed for nausea.), Disp: 40 tablet, Rfl: 0   traMADol (ULTRAM) 50 MG tablet, Take 1 tablet (50 mg total) by mouth every 12 (twelve) hours as needed., Disp: 30 tablet, Rfl: 0   triamcinolone (NASACORT) 55 MCG/ACT AERO nasal inhaler, Place 2 sprays into the nose daily. (Patient taking differently: Place 2 sprays into the nose daily as needed (for allergies or sinus issues).), Disp: 1 Inhaler, Rfl: 12   fexofenadine (ALLEGRA) 180 MG tablet, Take 1 tablet (180 mg total) by mouth daily., Disp: 30  tablet, Rfl: 2  EXAM:  VITALS per patient if applicable:  GENERAL: alert, oriented, appears well and in no acute distress  HEENT: atraumatic, conjunttiva clear, no obvious abnormalities on inspection of external nose and ears  NECK: normal movements of the head and neck  LUNGS: on inspection no signs of respiratory distress, breathing rate appears normal, no obvious gross SOB, gasping or wheezing  CV: no obvious cyanosis  MS: moves all visible extremities without noticeable abnormality  PSYCH/NEURO: pleasant and cooperative, no obvious depression or anxiety, speech and thought processing grossly intact  ASSESSMENT AND PLAN:  Discussed  the following assessment and plan:  COVID-19  Nasal congestion  Cough  -we discussed possible serious and likely etiologies, options for evaluation and workup, limitations of telemedicine visit vs in person visit, treatment, treatment risks and precautions. Pt prefers to treat via telemedicine empirically rather than in person at this moment.  She feels that her symptoms began 12 days ago and have not worsened.  She has persistent nasal congestion, now with thick sinus drainage and a cough.  She refused COVID testing early in the course of her illness, but did have a positive COVID test yesterday.  Seems she may have a prolonged course of COVID, possible developing sinusitis from this versus other.  There is a small possibility that she developed COVID before her prior illness have resolved, though she reports she has not been out anywhere since she got sick.  She has opted for treatment with a cough medicine, Tessalon and a prescription for doxycycline in case worsening or not improving over the next few days to take in case of a secondary bacterial illness.  She is out of the treatment window for COVID antivirals unfortunately.  She is vaccinated and boosted. Advised low threshold to seek prompt in person care if worsening, new symptoms arise, or if is  not improving with treatment. Discussed options for inperson care if PCP office not available. Did let this patient know that I only do telemedicine on Tuesdays and Thursdays for [place]. Advised to schedule follow up visit with PCP or [facility] if any further questions or concerns to avoid delays in care.   I discussed the assessment and treatment plan with the patient. The patient was provided an opportunity to ask questions and all were answered. The patient agreed with the plan and demonstrated an understanding of the instructions.     Lucretia Kern, DO

## 2021-03-18 ENCOUNTER — Other Ambulatory Visit: Payer: Self-pay | Admitting: Internal Medicine

## 2021-04-11 ENCOUNTER — Other Ambulatory Visit: Payer: Self-pay | Admitting: Internal Medicine

## 2021-04-21 ENCOUNTER — Encounter: Payer: Self-pay | Admitting: Internal Medicine

## 2021-04-21 ENCOUNTER — Other Ambulatory Visit: Payer: Self-pay

## 2021-04-21 ENCOUNTER — Ambulatory Visit (INDEPENDENT_AMBULATORY_CARE_PROVIDER_SITE_OTHER): Payer: Medicare Other | Admitting: Internal Medicine

## 2021-04-21 VITALS — BP 132/78 | HR 93 | Temp 99.1°F | Ht <= 58 in | Wt 163.2 lb

## 2021-04-21 DIAGNOSIS — E559 Vitamin D deficiency, unspecified: Secondary | ICD-10-CM

## 2021-04-21 DIAGNOSIS — I10 Essential (primary) hypertension: Secondary | ICD-10-CM

## 2021-04-21 DIAGNOSIS — E785 Hyperlipidemia, unspecified: Secondary | ICD-10-CM

## 2021-04-21 DIAGNOSIS — Z0001 Encounter for general adult medical examination with abnormal findings: Secondary | ICD-10-CM | POA: Diagnosis not present

## 2021-04-21 DIAGNOSIS — N1831 Chronic kidney disease, stage 3a: Secondary | ICD-10-CM | POA: Diagnosis not present

## 2021-04-21 DIAGNOSIS — Z0000 Encounter for general adult medical examination without abnormal findings: Secondary | ICD-10-CM

## 2021-04-21 DIAGNOSIS — E1169 Type 2 diabetes mellitus with other specified complication: Secondary | ICD-10-CM | POA: Diagnosis not present

## 2021-04-21 DIAGNOSIS — E538 Deficiency of other specified B group vitamins: Secondary | ICD-10-CM

## 2021-04-21 DIAGNOSIS — R079 Chest pain, unspecified: Secondary | ICD-10-CM | POA: Diagnosis not present

## 2021-04-21 LAB — HEPATIC FUNCTION PANEL
ALT: 13 U/L (ref 0–35)
AST: 17 U/L (ref 0–37)
Albumin: 4.3 g/dL (ref 3.5–5.2)
Alkaline Phosphatase: 51 U/L (ref 39–117)
Bilirubin, Direct: 0.1 mg/dL (ref 0.0–0.3)
Total Bilirubin: 0.6 mg/dL (ref 0.2–1.2)
Total Protein: 7.3 g/dL (ref 6.0–8.3)

## 2021-04-21 LAB — LIPID PANEL
Cholesterol: 166 mg/dL (ref 0–200)
HDL: 48.7 mg/dL (ref >39.00)
NonHDL: 117.72
Total CHOL/HDL Ratio: 3
Triglycerides: 257 mg/dL — ABNORMAL HIGH (ref 0.0–149.0)
VLDL: 51.4 mg/dL — ABNORMAL HIGH (ref 0.0–40.0)

## 2021-04-21 LAB — CBC WITH DIFFERENTIAL/PLATELET
Basophils Absolute: 0 10*3/uL (ref 0.0–0.1)
Basophils Relative: 0.4 % (ref 0.0–3.0)
Eosinophils Absolute: 0.1 10*3/uL (ref 0.0–0.7)
Eosinophils Relative: 2.1 % (ref 0.0–5.0)
HCT: 34.9 % — ABNORMAL LOW (ref 36.0–46.0)
Hemoglobin: 11.1 g/dL — ABNORMAL LOW (ref 12.0–15.0)
Lymphocytes Relative: 43.1 % (ref 12.0–46.0)
Lymphs Abs: 2.8 10*3/uL (ref 0.7–4.0)
MCHC: 31.7 g/dL (ref 30.0–36.0)
MCV: 87.5 fl (ref 78.0–100.0)
Monocytes Absolute: 0.4 10*3/uL (ref 0.1–1.0)
Monocytes Relative: 6.8 % (ref 3.0–12.0)
Neutro Abs: 3.1 10*3/uL (ref 1.4–7.7)
Neutrophils Relative %: 47.6 % (ref 43.0–77.0)
Platelets: 321 10*3/uL (ref 150.0–400.0)
RBC: 3.99 Mil/uL (ref 3.87–5.11)
RDW: 15.7 % — ABNORMAL HIGH (ref 11.5–15.5)
WBC: 6.5 10*3/uL (ref 4.0–10.5)

## 2021-04-21 LAB — VITAMIN B12: Vitamin B-12: 416 pg/mL (ref 211–911)

## 2021-04-21 LAB — BASIC METABOLIC PANEL
BUN: 18 mg/dL (ref 6–23)
CO2: 25 mEq/L (ref 19–32)
Calcium: 10.3 mg/dL (ref 8.4–10.5)
Chloride: 104 mEq/L (ref 96–112)
Creatinine, Ser: 1.57 mg/dL — ABNORMAL HIGH (ref 0.40–1.20)
GFR: 32.21 mL/min — ABNORMAL LOW (ref >60.00)
Glucose, Bld: 68 mg/dL — ABNORMAL LOW (ref 70–99)
Potassium: 4.6 mEq/L (ref 3.5–5.1)
Sodium: 139 mEq/L (ref 135–145)

## 2021-04-21 LAB — VITAMIN D 25 HYDROXY (VIT D DEFICIENCY, FRACTURES): VITD: 48.97 ng/mL (ref 30.00–100.00)

## 2021-04-21 LAB — MICROALBUMIN / CREATININE URINE RATIO
Creatinine,U: 230.4 mg/dL
Microalb Creat Ratio: 1.1 mg/g (ref 0.0–30.0)
Microalb, Ur: 2.4 mg/dL — ABNORMAL HIGH (ref 0.0–1.9)

## 2021-04-21 LAB — LDL CHOLESTEROL, DIRECT: Direct LDL: 83 mg/dL

## 2021-04-21 LAB — HEMOGLOBIN A1C: Hgb A1c MFr Bld: 6.2 % (ref 4.6–6.5)

## 2021-04-21 LAB — TSH: TSH: 1.93 u[IU]/mL (ref 0.35–5.50)

## 2021-04-21 MED ORDER — GABAPENTIN 300 MG PO CAPS
300.0000 mg | ORAL_CAPSULE | Freq: Three times a day (TID) | ORAL | 1 refills | Status: DC
Start: 2021-04-21 — End: 2022-01-09

## 2021-04-21 MED ORDER — METFORMIN HCL 1000 MG PO TABS: 1000.0000 mg | ORAL_TABLET | Freq: Two times a day (BID) | ORAL | 3 refills | Status: DC

## 2021-04-21 MED ORDER — CITALOPRAM HYDROBROMIDE 10 MG PO TABS: 10.0000 mg | ORAL_TABLET | Freq: Every day | ORAL | 3 refills | Status: DC

## 2021-04-21 MED ORDER — TRAMADOL HCL 50 MG PO TABS: 50.0000 mg | ORAL_TABLET | Freq: Two times a day (BID) | ORAL | 2 refills | Status: DC | PRN

## 2021-04-21 MED ORDER — ATORVASTATIN CALCIUM 20 MG PO TABS: 20.0000 mg | ORAL_TABLET | Freq: Every day | ORAL | 3 refills | Status: DC

## 2021-04-21 NOTE — Assessment & Plan Note (Signed)
Lab Results  Component Value Date   HGBA1C 6.0 (H) 12/14/2020   Stable, pt to continue current medical treatment amaryl, glucophage

## 2021-04-21 NOTE — Assessment & Plan Note (Signed)
Etiology unclear, ecg reviewed, for cxr, and stress testing,  to f/u any worsening symptoms or concerns

## 2021-04-21 NOTE — Assessment & Plan Note (Signed)
Age and sex appropriate education and counseling updated with regular exercise and diet Referrals for preventative services - declines eye exam referral Immunizations addressed - declines flu shot, shingrix, tdap Smoking counseling  - none needed Evidence for depression or other mood disorder - none significant Most recent labs reviewed. I have personally reviewed and have noted: 1) the patient's medical and social history 2) The patient's current medications and supplements 3) The patient's height, weight, and BMI have been recorded in the chart

## 2021-04-21 NOTE — Patient Instructions (Signed)
Your EKG was OK today  Please continue all other medications as before, and refills have been done if requested - the gabapentin  Please have the pharmacy call with any other refills you may need.  Please continue your efforts at being more active, low cholesterol diet, and weight control.  You are otherwise up to date with prevention measures today.  Please keep your appointments with your specialists as you may have planned   You will be contacted regarding the referral for: stress testing  Please go to the LAB at the blood drawing area for the tests to be done  You will be contacted by phone if any changes need to be made immediately.  Otherwise, you will receive a letter about your results with an explanation, but please check with MyChart first.  Please remember to sign up for MyChart if you have not done so, as this will be important to you in the future with finding out test results, communicating by private email, and scheduling acute appointments online when needed.  Please make an Appointment to return in 6 months, or sooner if needed

## 2021-04-21 NOTE — Progress Notes (Signed)
Patient ID: Cindy Larson, female   DOB: [birth year], 75 y.o.   MRN: [id number]         Chief Complaint:: wellness exam and cp, ckd, neuropathy       HPI:  Cindy Larson is a 75 y.o. female here for wellness exam; declienes flu shot, shingirx, eye exam referral, Tdap o/w up to date with preventive referrals and immunizations.                        Also needs gabapentin refill as this is working well for leg neuritic symptoms that are worse at night.  Also c/o 2 wks onset mild intermittent CP dull, without radiation, and not assoc with sob, diaphoresis, n/v, palps or syncope.  Pt is not positional, exertional, or pleuritic.  Pt denies orthopnea, PND, increased LE swelling.   Pt denies fever, wt loss, night sweats, loss of appetite, or other constitutional symptoms  Denies worsening reflux, abd pain, dysphagia, n/v, bowel change or blood. Denies worsening depressive symptoms, suicidal ideation, or panic.  Pt denies polydipsia, polyuria, or new focal neuro s/s.     Wt Readings from Last 3 Encounters:  04/21/21 163 lb 3.2 oz (74 kg)  01/22/21 168 lb (76.2 kg)  12/19/20 166 lb (75.3 kg)   BP Readings from Last 3 Encounters:  04/21/21 132/78  03/03/21 132/67  01/22/21 110/73   Immunization History  Administered Date(s) Administered   Fluad Quad(high Dose 65+) 05/24/2019, 06/15/2020   Influenza Split 06/29/2012   Influenza Whole 07/18/2003, 06/22/2008   Influenza, High Dose Seasonal PF 06/15/2013, 06/25/2015, 06/09/2016, 06/17/2017, 06/01/2018   Influenza,inj,Quad PF,6+ Mos 06/15/2014   PFIZER Comirnaty(Gray Top)Covid-19 Tri-Sucrose Vaccine 01/08/2021   PFIZER(Purple Top)SARS-COV-2 Vaccination 10/13/2019, 11/05/2019, 06/04/2020   Pneumococcal Conjugate-13 06/29/2013   Pneumococcal Polysaccharide-23 08/17/2005, 10/24/2010, 08/04/2016   Td 10/24/2010   There are no preventive care reminders to display for this patient.     Past Medical History:  Diagnosis Date    ALLERGIC RHINITIS    ANXIETY    COLONIC POLYPS, HX OF    DEPRESSION    DIABETES MELLITUS, TYPE II    Diverticulitis    DIVERTICULOSIS, COLON    Esophageal stricture    Gallstones    GERD    HIATAL HERNIA    History of blood transfusion    left knee replacement   HYPERLIPIDEMIA    Hypersomnolence    HYPERTENSION    INTERMITTENT VERTIGO    OBESITY    OSTEOARTHRITIS    L knee   OSTEOPENIA    Pneumonia    Post-menopausal    hormone replacement therapy   Past Surgical History:  Procedure Laterality Date   ABDOMINAL HYSTERECTOMY     CESAREAN SECTION     CHOLECYSTECTOMY     COLONOSCOPY  03/02/2017   [name]   Lysis of Adhesions     OOPHORECTOMY     one   POLYPECTOMY     ROTATOR CUFF REPAIR Left    TOTAL KNEE ARTHROPLASTY Left     reports that she has never smoked. She has never used smokeless tobacco. She reports that she does not drink alcohol and does not use drugs. family history includes Atrial fibrillation in her mother; Breast cancer in her maternal grandmother and paternal aunt; Cancer in her mother; Colon cancer in her paternal aunt; Diabetes in an other family member; Heart disease in her mother; Heart failure in her brother; Hypertension in her mother; Kidney disease in  her maternal aunt; Lung cancer in her brother; Ovarian cancer in her sister. Allergies  Allergen Reactions   Aspirin Nausea Only and Other (See Comments)    Made the stomach "hurt"   Fexofenadine Other (See Comments)    "This makes me feel badly. I didn't feel well when I took it"   Nsaids Nausea Only and Other (See Comments)    The patient CAN tolerate Tramadol and Tylenol- no Aleve or Motrin, though   Penicillins Hives and Other (See Comments)    Blisters, also   Prevnar [Pneumococcal 13-Val Conj Vacc] Swelling and Other (See Comments)    Left arm became swollen and welts appeared   Zocor [Simvastatin] Itching   Current Outpatient Medications on File Prior to Visit  Medication Sig Dispense  Refill   acetaminophen (TYLENOL) 500 MG tablet Take 500-1,000 mg by mouth every 8 (eight) hours as needed (for pain).     ALPRAZolam (XANAX) 0.25 MG tablet Take 1 tablet by mouth twice daily as needed for anxiety 60 tablet 5   Blood Glucose Monitoring Suppl (ACCU-CHEK GUIDE ME) w/Device KIT Use as directed four times per day E11.9 1 kit 0   cetirizine (ZYRTEC) 5 MG tablet Take 1 tablet (5 mg total) by mouth daily. 30 tablet 0   Cholecalciferol (VITAMIN D3) 50 MCG (2000 UT) TABS Take 2,000 Units by mouth daily.     clopidogrel (PLAVIX) 75 MG tablet Take 1 tablet (75 mg total) by mouth daily. 90 tablet 0   fluticasone (FLONASE) 50 MCG/ACT nasal spray Place 2 sprays into both nostrils daily. 16 g 0   glimepiride (AMARYL) 2 MG tablet Take 1 tablet (2 mg total) by mouth daily before breakfast. 90 tablet 3   glucose blood (ACCU-CHEK GUIDE) test strip Use as instructed four times per day  E11.9 400 each 12   Lancets MISC Use as directed four time per day E11.9 400 each 11   lisinopril (ZESTRIL) 5 MG tablet TAKE 1 TABLET BY MOUTH ONCE DAILY 90 tablet 3   meclizine (ANTIVERT) 25 MG tablet TAKE ONE TABLET BY MOUTH EVERY 6 HOURS AS NEEDED FOR DIZZINESS (Patient taking differently: Take 25 mg by mouth every 6 (six) hours as needed for dizziness.) 30 tablet 2   omeprazole (PRILOSEC) 20 MG capsule TAKE 1 CAPSULE BY MOUTH TWICE DAILY BEFORE MEAL(S) 180 capsule 0   PARoxetine (PAXIL) 20 MG tablet Take 1 tablet (20 mg total) by mouth daily. 90 tablet 3   Polyethyl Glycol-Propyl Glycol (SYSTANE OP) Place 1 drop into both eyes in the morning and at bedtime.     promethazine (PHENERGAN) 25 MG tablet TAKE ONE TABLET BY MOUTH EVERY 6 HOURS AS NEEDED FOR NAUSEA (Patient taking differently: Take 25 mg by mouth every 6 (six) hours as needed for nausea.) 40 tablet 0   triamcinolone (NASACORT) 55 MCG/ACT AERO nasal inhaler Place 2 sprays into the nose daily. (Patient taking differently: Place 2 sprays into the nose daily as  needed (for allergies or sinus issues).) 1 Inhaler 12   fexofenadine (ALLEGRA) 180 MG tablet Take 1 tablet (180 mg total) by mouth daily. 30 tablet 2   No current facility-administered medications on file prior to visit.        ROS:  All others reviewed and negative.  Objective        PE:  BP 132/78 (BP Location: Right Arm, Patient Position: Sitting, Cuff Size: Normal)   Pulse 93   Temp 99.1 F (37.3 C) (Oral)  Ht '4\' 10"'$  (1.473 m)   Wt 163 lb 3.2 oz (74 kg)   SpO2 98%   BMI 34.11 kg/m                 Constitutional: Pt appears in NAD               HENT: Head: NCAT.                Right Ear: External ear normal.                 Left Ear: External ear normal.                Eyes: . Pupils are equal, round, and reactive to light. Conjunctivae and EOM are normal               Nose: without d/c or deformity               Neck: Neck supple. Gross normal ROM               Cardiovascular: Normal rate and regular rhythm.                 Pulmonary/Chest: Effort normal and breath sounds without rales or wheezing.                Abd:  Soft, NT, ND, + BS, no organomegaly               Neurological: Pt is alert. At baseline orientation, motor grossly intact               Skin: Skin is warm. No rashes, no other new lesions, LE edema - none               Psychiatric: Pt behavior is normal without agitation   Micro: none  Cardiac tracings I have personally interpreted today:  NSR 90  Pertinent Radiological findings (summarize): none   Lab Results  Component Value Date   WBC 4.8 12/14/2020   HGB 9.6 (L) 12/14/2020   HCT 30.9 (L) 12/14/2020   PLT 264 12/14/2020   GLUCOSE 148 (H) 12/14/2020   CHOL 115 12/14/2020   TRIG 84 12/14/2020   HDL 44 12/14/2020   LDLDIRECT 73.0 02/16/2020   LDLCALC 54 12/14/2020   ALT 14 12/14/2020   AST 15 12/14/2020   NA 139 12/14/2020   K 5.4 (H) 12/14/2020   CL 106 12/14/2020   CREATININE 1.56 (H) 12/14/2020   BUN 22 12/14/2020   CO2 26 12/14/2020    TSH 2.25 02/16/2020   INR 0.9 12/14/2020   HGBA1C 6.0 (H) 12/14/2020   MICROALBUR 3.4 (H) 02/16/2020   Assessment/Plan:  Damaris Abeln is a 75 y.o. Black or African American [2] female with  has a past medical history of ALLERGIC RHINITIS, ANXIETY, COLONIC POLYPS, HX OF, DEPRESSION, DIABETES MELLITUS, TYPE II, Diverticulitis, DIVERTICULOSIS, COLON, Esophageal stricture, Gallstones, GERD, HIATAL HERNIA, History of blood transfusion, HYPERLIPIDEMIA, Hypersomnolence, HYPERTENSION, INTERMITTENT VERTIGO, OBESITY, OSTEOARTHRITIS, OSTEOPENIA, Pneumonia, and Post-menopausal.  Encounter for well adult exam with abnormal findings Age and sex appropriate education and counseling updated with regular exercise and diet Referrals for preventative services - declines eye exam referral Immunizations addressed - declines flu shot, shingrix, tdap Smoking counseling  - none needed Evidence for depression or other mood disorder - none significant Most recent labs reviewed. I have personally reviewed and have noted: 1) the patient's medical and social history 2) The patient's current medications  and supplements 3) The patient's height, weight, and BMI have been recorded in the chart   Type 2 diabetes mellitus with hyperlipidemia ([place]) Lab Results  Component Value Date   HGBA1C 6.0 (H) 12/14/2020   Stable, pt to continue current medical treatment amaryl, glucophage   CKD (chronic kidney disease) Lab Results  Component Value Date   CREATININE 1.56 (H) 12/14/2020   Stable overall, cont to avoid nephrotoxins   Chest pain Etiology unclear, ecg reviewed, for cxr, and stress testing,  to f/u any worsening symptoms or concerns  Essential hypertension . BP Readings from Last 3 Encounters:  04/21/21 132/78  03/03/21 132/67  01/22/21 110/73   Stable, pt to continue medical treatment lisiniopril  Followup: Return in about 6 months (around 10/22/2021).  Cindy Cower, MD 04/21/2021 9:39  PM [facility]

## 2021-04-21 NOTE — Assessment & Plan Note (Signed)
Lab Results  Component Value Date   CREATININE 1.56 (H) 12/14/2020   Stable overall, cont to avoid nephrotoxins

## 2021-04-21 NOTE — Assessment & Plan Note (Signed)
.   BP Readings from Last 3 Encounters:  04/21/21 132/78  03/03/21 132/67  01/22/21 110/73   Stable, pt to continue medical treatment lisiniopril

## 2021-04-22 ENCOUNTER — Encounter: Payer: Self-pay | Admitting: Internal Medicine

## 2021-04-22 LAB — URINALYSIS, ROUTINE W REFLEX MICROSCOPIC
Bilirubin Urine: NEGATIVE
Hgb urine dipstick: NEGATIVE
Leukocytes,Ua: NEGATIVE
Nitrite: NEGATIVE
RBC / HPF: NONE SEEN (ref 0–?)
Specific Gravity, Urine: 1.025 (ref 1.000–1.030)
Total Protein, Urine: NEGATIVE
Urine Glucose: NEGATIVE
Urobilinogen, UA: 0.2 (ref 0.0–1.0)
pH: 5.5 (ref 5.0–8.0)

## 2021-05-02 ENCOUNTER — Other Ambulatory Visit: Payer: Self-pay | Admitting: Internal Medicine

## 2021-05-02 NOTE — Telephone Encounter (Signed)
   Patient requesting today, due to extreme dizziness

## 2021-05-06 ENCOUNTER — Telehealth (HOSPITAL_COMMUNITY): Payer: Self-pay | Admitting: Internal Medicine

## 2021-05-06 NOTE — Telephone Encounter (Signed)
Patient declined to schedule the Myoview that you ordered due to she was not feeling well and will call us back when she is ready. Order will be removed from the active [place] and when patient calls back we will reinstate the order. Thank you

## 2021-05-19 ENCOUNTER — Telehealth (HOSPITAL_COMMUNITY): Payer: Self-pay | Admitting: *Deleted

## 2021-05-19 ENCOUNTER — Other Ambulatory Visit: Payer: Self-pay | Admitting: Adult Health

## 2021-05-19 NOTE — Telephone Encounter (Signed)
Left message on voicemail per DPR in reference to upcoming appointment scheduled on 05/21/21 with detailed instructions given per Myocardial Perfusion Study Information Sheet for the test. LM to arrive 15 minutes early, and that it is imperative to arrive on time for appointment to keep from having the test rescheduled. If you need to cancel or reschedule your appointment, please call the office within 24 hours of your appointment. Failure to do so may result in a cancellation of your appointment, and a $50 no show fee. Phone number given for call back for any questions. Kirstie Peri

## 2021-05-21 ENCOUNTER — Encounter: Payer: Self-pay | Admitting: Internal Medicine

## 2021-05-21 ENCOUNTER — Ambulatory Visit (HOSPITAL_COMMUNITY): Payer: Medicare Other | Attending: Cardiovascular Disease

## 2021-05-21 ENCOUNTER — Other Ambulatory Visit: Payer: Self-pay

## 2021-05-21 DIAGNOSIS — R079 Chest pain, unspecified: Secondary | ICD-10-CM | POA: Diagnosis not present

## 2021-05-21 LAB — MYOCARDIAL PERFUSION IMAGING
Base ST Depression (mm): 0 mm
LV dias vol: 37 mL (ref 46–106)
LV sys vol: 13 mL
Nuc Stress EF: 66 %
Peak HR: 102 {beats}/min
Rest HR: 78 {beats}/min
Rest Nuclear Isotope Dose: 10.8 mCi
SDS: 1
SRS: 0
SSS: 1
ST Depression (mm): 0 mm
Stress Nuclear Isotope Dose: 31.3 mCi
TID: 1

## 2021-05-21 MED ORDER — TECHNETIUM TC 99M TETROFOSMIN IV KIT
10.8000 | PACK | Freq: Once | INTRAVENOUS | Status: AC | PRN
  Administered 2021-05-21: 10.8 via INTRAVENOUS
  Filled 2021-05-21: qty 11

## 2021-05-21 MED ORDER — REGADENOSON 0.4 MG/5ML IV SOLN
0.4000 mg | Freq: Once | INTRAVENOUS | Status: AC
Start: 2021-05-21 — End: 2021-05-21
  Administered 2021-05-21: 0.4 mg via INTRAVENOUS

## 2021-05-21 MED ORDER — TECHNETIUM TC 99M TETROFOSMIN IV KIT
31.3000 | PACK | Freq: Once | INTRAVENOUS | Status: AC | PRN
  Administered 2021-05-21: 31.3 via INTRAVENOUS
  Filled 2021-05-21: qty 32

## 2021-06-13 ENCOUNTER — Telehealth: Payer: Self-pay | Admitting: Internal Medicine

## 2021-06-13 NOTE — Telephone Encounter (Signed)
Patient states she has a tingling and burning sensation all over her body  I spoke with [facility], [place] states they will call the patient in an hour.  Please advise

## 2021-06-13 NOTE — Telephone Encounter (Signed)
[place] for benadryl 50 mg every 6 hrs as needed, o/w not sure what to suggest based on this limited hx

## 2021-06-16 ENCOUNTER — Other Ambulatory Visit: Payer: Self-pay

## 2021-06-16 ENCOUNTER — Ambulatory Visit (INDEPENDENT_AMBULATORY_CARE_PROVIDER_SITE_OTHER): Payer: Medicare Other | Admitting: Internal Medicine

## 2021-06-16 ENCOUNTER — Ambulatory Visit: Payer: Medicare Other

## 2021-06-16 ENCOUNTER — Encounter: Payer: Self-pay | Admitting: Internal Medicine

## 2021-06-16 VITALS — BP 112/71 | HR 97 | Temp 98.1°F

## 2021-06-16 DIAGNOSIS — N1831 Chronic kidney disease, stage 3a: Secondary | ICD-10-CM

## 2021-06-16 DIAGNOSIS — E1169 Type 2 diabetes mellitus with other specified complication: Secondary | ICD-10-CM | POA: Diagnosis not present

## 2021-06-16 DIAGNOSIS — Z23 Encounter for immunization: Secondary | ICD-10-CM | POA: Diagnosis not present

## 2021-06-16 DIAGNOSIS — E785 Hyperlipidemia, unspecified: Secondary | ICD-10-CM | POA: Diagnosis not present

## 2021-06-16 DIAGNOSIS — L299 Pruritus, unspecified: Secondary | ICD-10-CM | POA: Insufficient documentation

## 2021-06-16 MED ORDER — CETIRIZINE HCL 5 MG PO TABS: 5.0000 mg | ORAL_TABLET | Freq: Every day | ORAL | 11 refills | Status: AC

## 2021-06-16 NOTE — Assessment & Plan Note (Addendum)
A1c was ok in 8/22 Cont on glimeperide

## 2021-06-16 NOTE — Assessment & Plan Note (Addendum)
C/o stinging and burning all over except for her palms and feet. Worse after hot long showers. No new meds Start Zyrtec po qd Short cooler showers every other day Do not use soap all over Aquaphore daily RTC to see Dr Jenny Reichmann if not better Pt offered inj of Depo-Medrol - refused

## 2021-06-16 NOTE — Progress Notes (Signed)
Subjective:  Patient ID: Katleen Carraway, female    DOB: 29-Mar-1946  Age: 75 y.o. MRN: [id number]  CC: Itching all over (Flu shot)   HPI Katherin Ramey presents for stinging and burning all over except for her palms and feet. Worse after hot long showers. No new meds F/u DM, CKD 3  Outpatient Medications Prior to Visit  Medication Sig Dispense Refill   acetaminophen (TYLENOL) 500 MG tablet Take 500-1,000 mg by mouth every 8 (eight) hours as needed (for pain).     ALPRAZolam (XANAX) 0.25 MG tablet Take 1 tablet by mouth twice daily as needed for anxiety 60 tablet 5   atorvastatin (LIPITOR) 20 MG tablet Take 1 tablet (20 mg total) by mouth daily. 90 tablet 3   Blood Glucose Monitoring Suppl (ACCU-CHEK GUIDE ME) w/Device KIT Use as directed four times per day E11.9 1 kit 0   Cholecalciferol (VITAMIN D3) 50 MCG (2000 UT) TABS Take 2,000 Units by mouth daily.     citalopram (CELEXA) 10 MG tablet Take 1 tablet (10 mg total) by mouth daily. 90 tablet 3   clopidogrel (PLAVIX) 75 MG tablet Take 1 tablet by mouth once daily 90 tablet 0   fluticasone (FLONASE) 50 MCG/ACT nasal spray Place 2 sprays into both nostrils daily. 16 g 0   gabapentin (NEURONTIN) 300 MG capsule Take 1 capsule (300 mg total) by mouth 3 (three) times daily. 270 capsule 1   glimepiride (AMARYL) 2 MG tablet Take 1 tablet (2 mg total) by mouth daily before breakfast. 90 tablet 3   glucose blood (ACCU-CHEK GUIDE) test strip Use as instructed four times per day  E11.9 400 each 12   Lancets MISC Use as directed four time per day E11.9 400 each 11   lisinopril (ZESTRIL) 5 MG tablet TAKE 1 TABLET BY MOUTH ONCE DAILY 90 tablet 3   meclizine (ANTIVERT) 25 MG tablet TAKE 1 TABLET BY MOUTH EVERY 6 HOURS AS NEEDED FOR DIZZINESS 30 tablet 0   metFORMIN (GLUCOPHAGE) 1000 MG tablet Take 1 tablet (1,000 mg total) by mouth 2 (two) times daily with a meal. 180 tablet 3   omeprazole (PRILOSEC) 20 MG capsule TAKE 1 CAPSULE BY  MOUTH TWICE DAILY BEFORE MEAL(S) 180 capsule 0   PARoxetine (PAXIL) 20 MG tablet Take 1 tablet (20 mg total) by mouth daily. 90 tablet 3   Polyethyl Glycol-Propyl Glycol (SYSTANE OP) Place 1 drop into both eyes in the morning and at bedtime.     promethazine (PHENERGAN) 25 MG tablet TAKE ONE TABLET BY MOUTH EVERY 6 HOURS AS NEEDED FOR NAUSEA (Patient taking differently: Take 25 mg by mouth every 6 (six) hours as needed for nausea.) 40 tablet 0   traMADol (ULTRAM) 50 MG tablet Take 1 tablet (50 mg total) by mouth every 12 (twelve) hours as needed. 30 tablet 2   triamcinolone (NASACORT) 55 MCG/ACT AERO nasal inhaler Place 2 sprays into the nose daily. (Patient taking differently: Place 2 sprays into the nose daily as needed (for allergies or sinus issues).) 1 Inhaler 12   cetirizine (ZYRTEC) 5 MG tablet Take 1 tablet (5 mg total) by mouth daily. 30 tablet 0   fexofenadine (ALLEGRA) 180 MG tablet Take 1 tablet (180 mg total) by mouth daily. 30 tablet 2   No facility-administered medications prior to visit.    ROS: Review of Systems  Objective:  BP 112/71 (BP Location: Left Arm)   Pulse 97   Temp 98.1 F (36.7 C) (Oral)  SpO2 97%   BP Readings from Last 3 Encounters:  06/16/21 112/71  04/21/21 132/78  03/03/21 132/67    Wt Readings from Last 3 Encounters:  05/21/21 163 lb (73.9 kg)  04/21/21 163 lb 3.2 oz (74 kg)  01/22/21 168 lb (76.2 kg)    Physical Exam  Lab Results  Component Value Date   WBC 6.5 04/21/2021   HGB 11.1 (L) 04/21/2021   HCT 34.9 (L) 04/21/2021   PLT 321.0 04/21/2021   GLUCOSE 68 (L) 04/21/2021   CHOL 166 04/21/2021   TRIG 257.0 (H) 04/21/2021   HDL 48.70 04/21/2021   LDLDIRECT 83.0 04/21/2021   LDLCALC 54 12/14/2020   ALT 13 04/21/2021   AST 17 04/21/2021   NA 139 04/21/2021   K 4.6 04/21/2021   CL 104 04/21/2021   CREATININE 1.57 (H) 04/21/2021   BUN 18 04/21/2021   CO2 25 04/21/2021   TSH 1.93 04/21/2021   INR 0.9 12/14/2020   HGBA1C 6.2  04/21/2021   MICROALBUR 2.4 (H) 04/21/2021    DG Chest 2 View  Result Date: 03/03/2021 CLINICAL DATA:  Cough and sore throat. EXAM: CHEST - 2 VIEW COMPARISON:  12/13/2020 FINDINGS: The lungs are clear without focal pneumonia, edema, pneumothorax or pleural effusion. The cardiopericardial silhouette is within normal limits for size. The visualized bony structures of the thorax show no acute abnormality. IMPRESSION: No active cardiopulmonary disease. Electronically Signed   By: Misty Stanley M.D.   On: 03/03/2021 18:21    Assessment & Plan:   Problem List Items Addressed This Visit     CKD (chronic kidney disease)    Reviewed labs from 8/22      Pruritic condition    C/o stinging and burning all over except for her palms and feet. Worse after hot long showers. No new meds Start Zyrtec po qd Short cooler showers every other day Do not use soap all over Aquaphore daily RTC to see Dr Jenny Reichmann if not better      Type 2 diabetes mellitus with hyperlipidemia ([place])    A1c was ok in 8/22 Cont on glimeperide      Other Visit Diagnoses     Needs flu shot    -  Primary   Relevant Orders   Flu Vaccine QUAD High Dose(Fluad) (Completed)         Follow-up: No follow-ups on file.  Walker Kehr, MD

## 2021-06-16 NOTE — Telephone Encounter (Signed)
Patient notified

## 2021-06-16 NOTE — Assessment & Plan Note (Signed)
Reviewed labs from 8/22

## 2021-06-16 NOTE — Patient Instructions (Signed)
  Start Zyrtec po daily Short cooler showers every other day Do not use soap all over Aquaphore daily See Dr Jenny Reichmann if not better

## 2021-06-23 ENCOUNTER — Ambulatory Visit: Payer: Medicare Other

## 2021-07-01 ENCOUNTER — Ambulatory Visit (INDEPENDENT_AMBULATORY_CARE_PROVIDER_SITE_OTHER): Payer: Medicare Other | Admitting: Internal Medicine

## 2021-07-01 ENCOUNTER — Encounter: Payer: Self-pay | Admitting: Internal Medicine

## 2021-07-01 ENCOUNTER — Other Ambulatory Visit: Payer: Self-pay

## 2021-07-01 VITALS — BP 130/80 | HR 99 | Resp 18 | Ht <= 58 in | Wt 171.4 lb

## 2021-07-01 DIAGNOSIS — R1031 Right lower quadrant pain: Secondary | ICD-10-CM

## 2021-07-01 LAB — CBC
HCT: 32.9 % — ABNORMAL LOW (ref 36.0–46.0)
Hemoglobin: 10.4 g/dL — ABNORMAL LOW (ref 12.0–15.0)
MCHC: 31.7 g/dL (ref 30.0–36.0)
MCV: 87.6 fl (ref 78.0–100.0)
Platelets: 273 10*3/uL (ref 150.0–400.0)
RBC: 3.75 Mil/uL — ABNORMAL LOW (ref 3.87–5.11)
RDW: 14.2 % (ref 11.5–15.5)
WBC: 5.3 10*3/uL (ref 4.0–10.5)

## 2021-07-01 LAB — COMPREHENSIVE METABOLIC PANEL
ALT: 10 U/L (ref 0–35)
AST: 15 U/L (ref 0–37)
Albumin: 4.1 g/dL (ref 3.5–5.2)
Alkaline Phosphatase: 56 U/L (ref 39–117)
BUN: 18 mg/dL (ref 6–23)
CO2: 23 mEq/L (ref 19–32)
Calcium: 9.2 mg/dL (ref 8.4–10.5)
Chloride: 105 mEq/L (ref 96–112)
Creatinine, Ser: 1.7 mg/dL — ABNORMAL HIGH (ref 0.40–1.20)
GFR: 29.24 mL/min — ABNORMAL LOW (ref >60.00)
Glucose, Bld: 143 mg/dL — ABNORMAL HIGH (ref 70–99)
Potassium: 4.8 mEq/L (ref 3.5–5.1)
Sodium: 137 mEq/L (ref 135–145)
Total Bilirubin: 0.6 mg/dL (ref 0.2–1.2)
Total Protein: 6.7 g/dL (ref 6.0–8.3)

## 2021-07-01 LAB — LIPASE: Lipase: 27 U/L (ref 11.0–59.0)

## 2021-07-01 MED ORDER — METRONIDAZOLE 500 MG PO TABS: 500.0000 mg | ORAL_TABLET | Freq: Two times a day (BID) | ORAL | 0 refills | Status: AC

## 2021-07-01 MED ORDER — CIPROFLOXACIN HCL 500 MG PO TABS: 500.0000 mg | ORAL_TABLET | Freq: Two times a day (BID) | ORAL | 0 refills | Status: AC

## 2021-07-01 MED ORDER — DICYCLOMINE HCL 10 MG PO CAPS: 10.0000 mg | ORAL_CAPSULE | Freq: Three times a day (TID) | ORAL | 0 refills | Status: DC

## 2021-07-01 NOTE — Progress Notes (Signed)
   Subjective:   Patient ID: Cindy Larson, female    DOB: 17-Oct-1945, 75 y.o.   MRN: [id number]  HPI The patient is a 75 YO female coming in for RLQ abdominal pain. Started in October and is taking tylenol for this.   Review of Systems  Constitutional: Negative.   HENT: Negative.    Eyes: Negative.   Respiratory:  Negative for cough, chest tightness and shortness of breath.   Cardiovascular:  Negative for chest pain, palpitations and leg swelling.  Gastrointestinal:  Positive for abdominal pain. Negative for abdominal distention, constipation, diarrhea, nausea and vomiting.  Musculoskeletal: Negative.   Skin: Negative.   Neurological: Negative.   Psychiatric/Behavioral: Negative.     Objective:  Physical Exam Constitutional:      Appearance: She is well-developed.  HENT:     Head: Normocephalic and atraumatic.  Cardiovascular:     Rate and Rhythm: Normal rate and regular rhythm.  Pulmonary:     Effort: Pulmonary effort is normal. No respiratory distress.     Breath sounds: Normal breath sounds. No wheezing or rales.  Abdominal:     General: Bowel sounds are normal. There is no distension.     Palpations: Abdomen is soft.     Tenderness: There is no abdominal tenderness. There is no rebound.  Musculoskeletal:     Cervical back: Normal range of motion.  Skin:    General: Skin is warm and dry.  Neurological:     Mental Status: She is alert and oriented to person, place, and time.     Coordination: Coordination normal.    Vitals:   07/01/21 1513  BP: 130/80  Pulse: 99  Resp: 18  SpO2: 100%  Weight: 171 lb 6.4 oz (77.7 kg)  Height: 4\' 10"  (1.473 m)    This visit occurred during the SARS-CoV-2 public health emergency.  Safety protocols were in place, including screening questions prior to the visit, additional usage of staff PPE, and extensive cleaning of exam room while observing appropriate contact time as indicated for disinfecting solutions.   Assessment  & Plan:

## 2021-07-01 NOTE — Patient Instructions (Signed)
We have sent in the cipro to take 1 pill twice a day for 1 week. We have also sent in flagyl to take 1 pill twice a day for 1 week.  We have sent in bentyl for pain to use 1 pill up to 3 times a day as needed.

## 2021-07-04 ENCOUNTER — Encounter: Payer: Self-pay | Admitting: Internal Medicine

## 2021-07-04 DIAGNOSIS — R1031 Right lower quadrant pain: Secondary | ICD-10-CM | POA: Insufficient documentation

## 2021-07-04 NOTE — Assessment & Plan Note (Addendum)
Checking CBC, CMP, lipase. Rx cipro and metronidazole to cover for diverticulitis as known diverticulosis and bentyl for pain prn.

## 2021-07-12 ENCOUNTER — Other Ambulatory Visit: Payer: Self-pay | Admitting: Internal Medicine

## 2021-07-12 NOTE — Telephone Encounter (Signed)
Please refill as per office routine med refill policy (all routine meds to be refilled for 3 mo or monthly (per pt preference) up to one year from last visit, then month to month grace period for 3 mo, then further med refills will have to be denied) ? ?

## 2021-07-30 ENCOUNTER — Encounter: Payer: Self-pay | Admitting: Adult Health

## 2021-07-30 ENCOUNTER — Ambulatory Visit: Payer: Medicare Other | Admitting: Adult Health

## 2021-07-30 VITALS — BP 104/72 | HR 90 | Ht <= 58 in | Wt 164.0 lb

## 2021-07-30 DIAGNOSIS — E782 Mixed hyperlipidemia: Secondary | ICD-10-CM

## 2021-07-30 DIAGNOSIS — I10 Essential (primary) hypertension: Secondary | ICD-10-CM | POA: Diagnosis not present

## 2021-07-30 DIAGNOSIS — E11649 Type 2 diabetes mellitus with hypoglycemia without coma: Secondary | ICD-10-CM | POA: Diagnosis not present

## 2021-07-30 DIAGNOSIS — G459 Transient cerebral ischemic attack, unspecified: Secondary | ICD-10-CM | POA: Diagnosis not present

## 2021-07-30 DIAGNOSIS — G4719 Other hypersomnia: Secondary | ICD-10-CM

## 2021-07-30 NOTE — Progress Notes (Deleted)
[facility] [address] [place]. [place] [address] [phone]       STROKE FOLLOW UP NOTE  [name] Date of Birth:  [birth year] Medical Record Number:  [id number]   Reason for Referral: TIA follow up    SUBJECTIVE:   CHIEF COMPLAINT:  Chief Complaint  Patient presents with   Follow-up    Rm 3 alone here for 6 month f/u. Pt reports she has been doing ok, feels like fatigue has increased since her last visit.      HPI:   Update 07/30/2021 JM: Returns for 67-month TIA follow-up.  Stable since prior visit -denies new or reoccurring stroke/TIA symptoms Compliant on Plavix and atorvastatin -denies side effects Blood pressure today *** Glucose levels ***  No new concerns at this time     History provided for reference purposes only Initial visit 01/22/2021 JM: Ms. Scroggs is being seen for hospital accompanied by her daughter.  She reports she has been doing well since discharge without new or reoccurring stroke/TIA symptoms.  She has remained on aspirin and Plavix despite 3-week recommendation but denies bleeding or bruising.  Remains on atorvastatin 20 mg daily without myalgias.  Blood pressure today 110/73.  Routinely monitors at home and has been stable.  Monitors glucose levels at home which have been stable since diabetic regimen adjustments by PCP and denies any additional hypoglycemic events.  No concerns at this time.  Stroke admission 12/13/2020 Divine Imber is a 75 year old female with history of diabetes, hypertension, hyperlipidemia, obesity, vertigo admitted on 12/13/2020 for episode of left lip and tongue numbness and left leg weakness as well as slurred speech as well as hypoglycemic.  Symptoms gradually improved and resolved in 2 hours.  CT no acute abnormality.  MRI no acute infarct, MRA head and neck unremarkable.  EF 50 to 55%.  LDL 54, A1c 6.0 (down from 6.9). LE venous Doppler negative for DVT.  Concern of  possible TIA vs hypoglycemic event.  Recommended DAPT for 3 weeks and Plavix alone as well as continuation of atorvastatin.  Evaluated by therapy and discharged home in stable condition without therapy needs.        ROS:   14 system review of systems performed and negative with exception of no complaints  PMH:  Past Medical History:  Diagnosis Date   ALLERGIC RHINITIS    ANXIETY    COLONIC POLYPS, HX OF    DEPRESSION    DIABETES MELLITUS, TYPE II    Diverticulitis    DIVERTICULOSIS, COLON    Esophageal stricture    Gallstones    GERD    HIATAL HERNIA    History of blood transfusion    left knee replacement   HYPERLIPIDEMIA    Hypersomnolence    HYPERTENSION    INTERMITTENT VERTIGO    OBESITY    OSTEOARTHRITIS    L knee   OSTEOPENIA    Pneumonia    Post-menopausal    hormone replacement therapy    PSH:  Past Surgical History:  Procedure Laterality Date   ABDOMINAL HYSTERECTOMY     CESAREAN SECTION     CHOLECYSTECTOMY     COLONOSCOPY  03/02/2017   [name]   Lysis of Adhesions     OOPHORECTOMY     one   POLYPECTOMY     ROTATOR CUFF REPAIR Left    TOTAL KNEE ARTHROPLASTY Left     Social History:  Social History   Socioeconomic History   Marital status:  Married    Spouse name: Not on file   Number of children: 3   Years of education: Not on file   Highest education level: Not on file  Occupational History   Occupation: Control and instrumentation engineer  Tobacco Use   Smoking status: Never   Smokeless tobacco: Never  Vaping Use   Vaping Use: Never used  Substance and Sexual Activity   Alcohol use: No   Drug use: No   Sexual activity: Not on file  Other Topics Concern   Not on file  Social History Narrative   Not on file   Social Determinants of Health   Financial Resource Strain: Not on file  Food Insecurity: Not on file  Transportation Needs: Not on file  Physical Activity: Not on file  Stress: Not on file  Social Connections: Not on file  Intimate Partner  Violence: Not on file    Family History:  Family History  Problem Relation Age of Onset   Hypertension Mother    Atrial fibrillation Mother    Heart disease Mother        CHF   Cancer Mother        Cancer in "thigh"   Ovarian cancer Sister    Lung cancer Brother        smoker   Heart failure Brother        CHF   Breast cancer Maternal Grandmother    Kidney disease Maternal Aunt    Colon cancer Paternal Aunt        originated as breast CA   Breast cancer Paternal Aunt    Diabetes Other        paternal 40 and uncle   Esophageal cancer Neg Hx    Rectal cancer Neg Hx    Stomach cancer Neg Hx    Colon polyps Neg Hx     Medications:   Current Outpatient Medications on File Prior to Visit  Medication Sig Dispense Refill   acetaminophen (TYLENOL) 500 MG tablet Take 500-1,000 mg by mouth every 8 (eight) hours as needed (for pain).     ALPRAZolam (XANAX) 0.25 MG tablet Take 1 tablet by mouth twice daily as needed for anxiety 60 tablet 5   atorvastatin (LIPITOR) 20 MG tablet Take 1 tablet (20 mg total) by mouth daily. 90 tablet 3   Blood Glucose Monitoring Suppl (ACCU-CHEK GUIDE ME) w/Device KIT Use as directed four times per day E11.9 1 kit 0   cetirizine (ZYRTEC) 5 MG tablet Take 1 tablet (5 mg total) by mouth daily. 30 tablet 11   Cholecalciferol (VITAMIN D3) 50 MCG (2000 UT) TABS Take 2,000 Units by mouth daily.     citalopram (CELEXA) 10 MG tablet Take 1 tablet (10 mg total) by mouth daily. 90 tablet 3   clopidogrel (PLAVIX) 75 MG tablet Take 1 tablet by mouth once daily 90 tablet 0   fluticasone (FLONASE) 50 MCG/ACT nasal spray Place 2 sprays into both nostrils daily. 16 g 0   gabapentin (NEURONTIN) 300 MG capsule Take 1 capsule (300 mg total) by mouth 3 (three) times daily. 270 capsule 1   glimepiride (AMARYL) 2 MG tablet Take 1 tablet (2 mg total) by mouth daily before breakfast. 90 tablet 3   glucose blood (ACCU-CHEK GUIDE) test strip Use as instructed four times per day   E11.9 400 each 12   Lancets MISC Use as directed four time per day E11.9 400 each 11   lisinopril (ZESTRIL) 5 MG tablet TAKE 1 TABLET  BY MOUTH ONCE DAILY 90 tablet 3   meclizine (ANTIVERT) 25 MG tablet TAKE 1 TABLET BY MOUTH EVERY 6 HOURS AS NEEDED FOR DIZZINESS 30 tablet 0   metFORMIN (GLUCOPHAGE) 1000 MG tablet Take 1 tablet (1,000 mg total) by mouth 2 (two) times daily with a meal. 180 tablet 3   omeprazole (PRILOSEC) 20 MG capsule TAKE 1 CAPSULE BY MOUTH TWICE DAILY BEFORE MEAL(S) 180 capsule 0   PARoxetine (PAXIL) 20 MG tablet Take 1 tablet (20 mg total) by mouth daily. 90 tablet 3   Polyethyl Glycol-Propyl Glycol (SYSTANE OP) Place 1 drop into both eyes in the morning and at bedtime.     promethazine (PHENERGAN) 25 MG tablet TAKE ONE TABLET BY MOUTH EVERY 6 HOURS AS NEEDED FOR NAUSEA (Patient taking differently: Take 25 mg by mouth every 6 (six) hours as needed for nausea.) 40 tablet 0   traMADol (ULTRAM) 50 MG tablet Take 1 tablet (50 mg total) by mouth every 12 (twelve) hours as needed. 30 tablet 2   triamcinolone (NASACORT) 55 MCG/ACT AERO nasal inhaler Place 2 sprays into the nose daily. (Patient taking differently: Place 2 sprays into the nose daily as needed (for allergies or sinus issues).) 1 Inhaler 12   No current facility-administered medications on file prior to visit.    Allergies:   Allergies  Allergen Reactions   Aspirin Nausea Only and Other (See Comments)    Made the stomach "hurt"   Fexofenadine Other (See Comments)    "This makes me feel badly. I didn't feel well when I took it"   Nsaids Nausea Only and Other (See Comments)    The patient CAN tolerate Tramadol and Tylenol- no Aleve or Motrin, though   Penicillins Hives and Other (See Comments)    Blisters, also   Prevnar [Pneumococcal 13-Val Conj Vacc] Swelling and Other (See Comments)    Left arm became swollen and welts appeared   Zocor [Simvastatin] Itching      OBJECTIVE:  Physical Exam  Vitals:    07/30/21 1548  BP: 104/72  Pulse: 90  SpO2: 99%  Weight: 164 lb (74.4 kg)  Height: $Remove'4\' 10"'qiNwgfi$  (1.473 m)    Body mass index is 34.28 kg/m. No results found.  General: well developed, well nourished,  very pleasant elderly African-American female, seated, in no evident distress Head: head normocephalic and atraumatic.   Neck: supple with no carotid or supraclavicular bruits Cardiovascular: regular rate and rhythm, no murmurs Musculoskeletal: no deformity Skin:  no rash/petichiae Vascular:  Normal pulses all extremities   Neurologic Exam Mental Status: Awake and fully alert.   Fluent speech and language.  Oriented to place and time. Recent and remote memory intact. Attention span, concentration and fund of knowledge appropriate. Mood and affect appropriate.  Cranial Nerves: Pupils equal, briskly reactive to light. Extraocular movements full without nystagmus. Visual fields full to confrontation. Hearing intact. Facial sensation intact. Face, tongue, palate moves normally and symmetrically.  Motor: Normal bulk and tone. Normal strength in all tested extremity muscles Sensory.: intact to touch , pinprick , position and vibratory sensation.  Coordination: Rapid alternating movements normal in all extremities. Finger-to-nose and heel-to-shin performed accurately bilaterally. Gait and Station: Arises from chair without difficulty. Stance is normal. Gait demonstrates normal stride length and balance without use of assistive device. Tandem walk and heel toe moderate difficulty.  Reflexes: 1+ and symmetric. Toes downgoing.         ASSESSMENT: Cindy Larson is a 75 y.o. year old female  presented with left-sided numbness/tingling around her face, lips and leg as well as dysarthria on 01/11/2021 possibly TIA vs hypoglycemic event. Vascular risk factors include DM, HTN, HLD.      PLAN:  TIA : Continue clopidogrel 75 mg daily  and atorvastatin 20 mg daily for secondary stroke  prevention.  Patient request refill on Plavix which will be sent to her pharmacy but request ongoing refills provided by PCP.  Advised to discontinue aspirin as 3 weeks DAPT completed.  Discussed secondary stroke prevention measures and importance of close PCP follow up for aggressive stroke risk factor management  HTN: BP goal <130/90.  Stable on current regimen per PCP HLD: LDL goal <70. On atorvastatin 20 mg daily per PCP. Recent lipid panel satisfactory DMII: A1c goal<7.0.  Prior A1c 6.2 (03/2021).  Managed by PCP    Follow up in 6 months or call earlier if needed   CC:  PCP: Biagio Borg, MD    I spent 43 minutes of face-to-face and non-face-to-face time with patient and daughter.  This included previsit chart review including recent hospitalization pertinent progress notes, lab work and imaging, lab review, study review, electronic health record documentation, and extensive discussion with patient and daughter regarding recent event possibly TIA versus hypoglycemic event, education on secondary stroke prevention measures and importance of managing stroke risk factors as well as compliance with all above medications and answered all other questions to patient and daughters satisfaction  Frann Rider, AGNP-BC  [facility] [address] [place], [place] [address]  Phone [phone] Fax [phone] Note: This document was prepared with digital dictation and possible smart phrase technology. Any transcriptional errors that result from this process are unintentional.

## 2021-07-30 NOTE — Progress Notes (Signed)
[facility] [address] [place] [address] [phone]       STROKE FOLLOW UP NOTE  [name] Date of Birth:  1946-06-28 Medical Record Number:  [id number]   Reason for Referral: TIA follow up    SUBJECTIVE:   CHIEF COMPLAINT:  Chief Complaint  Patient presents with   Follow-up    Rm 3 alone here for 6 month f/u. Pt reports she has been doing ok, feels like fatigue has increased since her last visit.      HPI:   Update 07/30/2021 JM: Returns for 75-monthTIA follow-up unaccompanied  Stable since prior visit -denies new or reoccurring stroke/TIA symptoms.  Compliant on Plavix and atorvastatin -denies side effects Blood pressure today 104/72 Glucose levels checked QID, reports it is low in the AM from 60-90 and better throughout the day with meals. Takes Metformin and Glimepiride as prescribed.  Recent A1c 6.2.  Followed by PCP Dr. JJenny Larson  C/o ongoing fatigue throughout the day. She does perform household chores daily and cleans but seems majority of the time she is sedentary.  She does not do any routine physical activity or exercise.  She does not sleep well at night having trouble going to sleep and awakens frequently with nocturia. She reports taking multiple naps throughout the day.  She denies snoring but states her husband says she "makes funny noises". She has been trying to increase her daily activity and try to avoid day time napping which helps her sleep better at night. She has not previously been tested for sleep apnea. She is currently on Paxil and Celexa and does have some anxiety but denies depression  No further concerns at this time     History provided for reference purposes only Initial visit 01/22/2021 JM: [name] being seen for hospital accompanied by her daughter.  She reports she has been doing well since discharge without new or reoccurring stroke/TIA symptoms.  She has remained on aspirin and  Plavix despite 3-week recommendation but denies bleeding or bruising.  Remains on atorvastatin 20 mg daily without myalgias.  Blood pressure today 110/73.  Routinely monitors at home and has been stable.  Monitors glucose levels at home which have been stable since diabetic regimen adjustments by PCP and denies any additional hypoglycemic events.  No concerns at this time.  Stroke admission 12/13/2020 [name] Cindy 75year old female with history of diabetes, hypertension, hyperlipidemia, obesity, vertigo admitted on 12/13/2020 for episode of left lip and tongue numbness and left leg weakness as well as slurred speech as well as hypoglycemic.  Symptoms gradually improved and resolved in 2 hours.  CT no acute abnormality.  MRI no acute infarct, MRA head and neck unremarkable.  EF 50 to 55%.  LDL 54, A1c 6.0 (down from 6.9). LE venous Doppler negative for DVT.  Concern of possible TIA vs hypoglycemic event.  Recommended DAPT for 3 weeks and Plavix alone as well as continuation of atorvastatin.  Evaluated by therapy and discharged home in stable condition without therapy needs.        ROS:   14 system review of systems performed and negative with exception of no complaints  PMH:  Past Medical History:  Diagnosis Date   ALLERGIC RHINITIS    ANXIETY    COLONIC POLYPS, HX OF    DEPRESSION    DIABETES MELLITUS, TYPE II    Diverticulitis    DIVERTICULOSIS, COLON    Esophageal stricture  Gallstones    GERD    HIATAL HERNIA    History of blood transfusion    left knee replacement   HYPERLIPIDEMIA    Hypersomnolence    HYPERTENSION    INTERMITTENT VERTIGO    OBESITY    OSTEOARTHRITIS    L knee   OSTEOPENIA    Pneumonia    Post-menopausal    hormone replacement therapy    PSH:  Past Surgical History:  Procedure Laterality Date   ABDOMINAL HYSTERECTOMY     CESAREAN SECTION     CHOLECYSTECTOMY     COLONOSCOPY  03/02/2017   [name]   Lysis of Adhesions      OOPHORECTOMY     one   POLYPECTOMY     ROTATOR CUFF REPAIR Left    TOTAL KNEE ARTHROPLASTY Left     Social History:  Social History   Socioeconomic History   Marital status: Married    Spouse name: Not on file   Number of children: 3   Years of education: Not on file   Highest education level: Not on file  Occupational History   Occupation: Control and instrumentation engineer  Tobacco Use   Smoking status: Never   Smokeless tobacco: Never  Vaping Use   Vaping Use: Never used  Substance and Sexual Activity   Alcohol use: No   Drug use: No   Sexual activity: Not on file  Other Topics Concern   Not on file  Social History Narrative   Not on file   Social Determinants of Health   Financial Resource Strain: Not on file  Food Insecurity: Not on file  Transportation Needs: Not on file  Physical Activity: Not on file  Stress: Not on file  Social Connections: Not on file  Intimate Partner Violence: Not on file    Family History:  Family History  Problem Relation Age of Onset   Hypertension Mother    Atrial fibrillation Mother    Heart disease Mother        CHF   Cancer Mother        Cancer in "thigh"   Ovarian cancer Sister    Lung cancer Brother        smoker   Heart failure Brother        CHF   Breast cancer Maternal Grandmother    Kidney disease Maternal Aunt    Colon cancer Paternal Aunt        originated as breast CA   Breast cancer Paternal Aunt    Diabetes Other        paternal 64 and uncle   Esophageal cancer Neg Hx    Rectal cancer Neg Hx    Stomach cancer Neg Hx    Colon polyps Neg Hx     Medications:   Current Outpatient Medications on File Prior to Visit  Medication Sig Dispense Refill   acetaminophen (TYLENOL) 500 MG tablet Take 500-1,000 mg by mouth every 8 (eight) hours as needed (for pain).     ALPRAZolam (XANAX) 0.25 MG tablet Take 1 tablet by mouth twice daily as needed for anxiety 60 tablet 5   atorvastatin (LIPITOR) 20 MG tablet Take 1 tablet (20 mg  total) by mouth daily. 90 tablet 3   Blood Glucose Monitoring Suppl (ACCU-CHEK GUIDE ME) w/Device KIT Use as directed four times per day E11.9 1 kit 0   cetirizine (ZYRTEC) 5 MG tablet Take 1 tablet (5 mg total) by mouth daily. 30 tablet 11   Cholecalciferol (VITAMIN  D3) 50 MCG (2000 UT) TABS Take 2,000 Units by mouth daily.     citalopram (CELEXA) 10 MG tablet Take 1 tablet (10 mg total) by mouth daily. 90 tablet 3   clopidogrel (PLAVIX) 75 MG tablet Take 1 tablet by mouth once daily 90 tablet 0   fluticasone (FLONASE) 50 MCG/ACT nasal spray Place 2 sprays into both nostrils daily. 16 Cindy 0   gabapentin (NEURONTIN) 300 MG capsule Take 1 capsule (300 mg total) by mouth 3 (three) times daily. 270 capsule 1   glimepiride (AMARYL) 2 MG tablet Take 1 tablet (2 mg total) by mouth daily before breakfast. 90 tablet 3   glucose blood (ACCU-CHEK GUIDE) test strip Use as instructed four times per day  E11.9 400 each 12   Lancets MISC Use as directed four time per day E11.9 400 each 11   lisinopril (ZESTRIL) 5 MG tablet TAKE 1 TABLET BY MOUTH ONCE DAILY 90 tablet 3   meclizine (ANTIVERT) 25 MG tablet TAKE 1 TABLET BY MOUTH EVERY 6 HOURS AS NEEDED FOR DIZZINESS 30 tablet 0   metFORMIN (GLUCOPHAGE) 1000 MG tablet Take 1 tablet (1,000 mg total) by mouth 2 (two) times daily with Cindy meal. 180 tablet 3   omeprazole (PRILOSEC) 20 MG capsule TAKE 1 CAPSULE BY MOUTH TWICE DAILY BEFORE MEAL(S) 180 capsule 0   PARoxetine (PAXIL) 20 MG tablet Take 1 tablet (20 mg total) by mouth daily. 90 tablet 3   Polyethyl Glycol-Propyl Glycol (SYSTANE OP) Place 1 drop into both eyes in the morning and at bedtime.     promethazine (PHENERGAN) 25 MG tablet TAKE ONE TABLET BY MOUTH EVERY 6 HOURS AS NEEDED FOR NAUSEA (Patient taking differently: Take 25 mg by mouth every 6 (six) hours as needed for nausea.) 40 tablet 0   traMADol (ULTRAM) 50 MG tablet Take 1 tablet (50 mg total) by mouth every 12 (twelve) hours as needed. 30 tablet 2    triamcinolone (NASACORT) 55 MCG/ACT AERO nasal inhaler Place 2 sprays into the nose daily. (Patient taking differently: Place 2 sprays into the nose daily as needed (for allergies or sinus issues).) 1 Inhaler 12   No current facility-administered medications on file prior to visit.    Allergies:   Allergies  Allergen Reactions   Aspirin Nausea Only and Other (See Comments)    Made the stomach "hurt"   Fexofenadine Other (See Comments)    "This makes me feel badly. I didn't feel well when I took it"   Nsaids Nausea Only and Other (See Comments)    The patient CAN tolerate Tramadol and Tylenol- no Aleve or Motrin, though   Penicillins Hives and Other (See Comments)    Blisters, also   Prevnar [Pneumococcal 13-Val Conj Vacc] Swelling and Other (See Comments)    Left arm became swollen and welts appeared   Zocor [Simvastatin] Itching      OBJECTIVE:  Physical Exam  Vitals:   07/30/21 1548  BP: 104/72  Pulse: 90  SpO2: 99%  Weight: 164 lb (74.4 kg)  Height: _0  (1.473 m)    Body mass index is 34.28 kg/m. No results found.  General: well developed, well nourished,  very pleasant elderly African-American female, seated, in no evident distress Head: head normocephalic and atraumatic.   Neck: supple with no carotid or supraclavicular bruits Cardiovascular: regular rate and rhythm, no murmurs Musculoskeletal: no deformity Skin:  no rash/petichiae Vascular:  Normal pulses all extremities   Neurologic Exam Mental Status: Awake and fully  alert.   Fluent speech and language.  Oriented to place and time. Recent and remote memory intact. Attention span, concentration and fund of knowledge appropriate. Mood and affect appropriate.  Cranial Nerves: Pupils equal, briskly reactive to light. Extraocular movements full without nystagmus. Visual fields full to confrontation. Hearing intact. Facial sensation intact. Face, tongue, palate moves normally and symmetrically.  Motor: Normal  bulk and tone. Normal strength in all tested extremity muscles Sensory.: intact to touch , pinprick , position and vibratory sensation.  Coordination: Rapid alternating movements normal in all extremities. Finger-to-nose and heel-to-shin performed accurately bilaterally. Gait and Station: Arises from chair without difficulty. Stance is normal. Gait demonstrates normal stride length and balance without use of assistive device. Tandem walk and heel toe moderate difficulty.  Reflexes: 1+ and symmetric. Toes downgoing.         ASSESSMENT: Cindy Larson is Cindy 75 y.o. year old female presented with transient left-sided numbness/tingling around her face, lips and leg as well as dysarthria on 01/11/2021 possibly TIA vs hypoglycemic event. Vascular risk factors include DM, HTN, HLD.      PLAN:  TIA : Continue clopidogrel 75 mg daily  and atorvastatin 20 mg daily for secondary stroke prevention.  Patient request refill on Plavix which will be sent to her pharmacy but request ongoing refills provided by PCP.  Advised to discontinue aspirin as 3 weeks DAPT completed.  Discussed secondary stroke prevention measures and importance of close PCP follow up for aggressive stroke risk factor management  HTN: BP goal <130/90.  Stable on current regimen per PCP HLD: LDL goal <70. On atorvastatin 20 mg daily per PCP. Recent lipid panel satisfactory DMII: A1c goal<7.0.  Prior A1c 6.2 (03/2021).  Managed by PCP Fatigue: Likely multifactorial as she is sedentary, altered sleep cycle, possible underlying depression and chronic anemia ?iron deficiency.  Discussed pursuing sleep apnea evaluation but declines interest at this time. She was advised to contact office if she wishes to pursue. Encouraged to follow up with PCP for further evaluation and discussion    Overall stable from stroke standpoint - plan to collaborate care with PCP and may follow-up here as needed   CC:  PCP: Biagio Borg, MD    I  spent 34 minutes of face-to-face and non-face-to-face time with patient.  This included previsit chart review, lab review, study review, electronic health record documentation, and education and discussion with patient regarding TIA and secondary stroke prevention measures and importance of managing stroke risk factors, day time fatigue and possible underlying factors and answered all other questions to patient satisfaction  Frann Rider, AGNP-BC  [facility] [address] [place], [place] [address]  Phone [phone] Fax [phone] Note: This document was prepared with digital dictation and possible smart phrase technology. Any transcriptional errors that result from this process are unintentional.

## 2021-07-30 NOTE — Patient Instructions (Addendum)
Continue clopidogrel 75 mg daily  and atorvastatin 20mg  daily for secondary stroke prevention  Continue to follow up with PCP regarding cholesterol, blood pressure and diabetes management  Maintain strict control of hypertension with blood pressure goal below 130/90, diabetes with hemoglobin A1c goal below 7.0% and cholesterol with LDL cholesterol (bad cholesterol) goal below 70 mg/dL.   Please ensure you follow up with your PCP regarding fatigue concerns        Thank you for coming to see [place] at [facility]. I hope we have been able to provide you high quality care today.  You may receive a patient satisfaction survey over the next few weeks. We would appreciate your feedback and comments so that we may continue to improve ourselves and the health of our patients.

## 2021-08-02 ENCOUNTER — Other Ambulatory Visit: Payer: Self-pay | Admitting: Internal Medicine

## 2021-08-05 ENCOUNTER — Other Ambulatory Visit: Payer: Self-pay | Admitting: Adult Health

## 2021-08-11 ENCOUNTER — Other Ambulatory Visit: Payer: Self-pay | Admitting: Adult Health

## 2021-09-10 NOTE — Progress Notes (Signed)
Subjective:    Patient ID: Cindy Larson, female    DOB: 1945-09-02, 76 y.o.   MRN: [id number]  This visit occurred during the SARS-CoV-2 public health emergency.  Safety protocols were in place, including screening questions prior to the visit, additional usage of staff PPE, and extensive cleaning of exam room while observing appropriate contact time as indicated for disinfecting solutions.    HPI The patient is here for an acute visit.   Diarrhea - it started about the 29th of December.  She had it all through the night.  She still has some diarrhea, but is better.  She has diarrhea 2-3 times a day.  No abdominal pain, fever, cold symptoms, brbpr.  No normal BM - typically just soft or diarrhea.    Medications and allergies reviewed with patient and updated if appropriate.  Patient Active Problem List   Diagnosis Date Noted   RLQ abdominal pain 07/04/2021   Pruritic condition 06/16/2021   Left ankle sprain 12/19/2020   Hypoglycemia 12/14/2020   Left leg swelling 12/14/2020   TIA (transient ischemic attack)    Right knee pain 02/17/2020   Acute sinus infection 01/03/2020   Left-sided face pain 02/24/2019   CKD (chronic kidney disease) 06/01/2018   Diverticulitis of colon without hemorrhage 04/29/2018   Bilateral shoulder pain 01/31/2016   Chest pain 04/18/2015   Left arm pain 07/25/2013   Neck pain on left side 06/15/2013   LLQ abdominal pain 10/11/2012   Back pain 03/03/2012   Right upper lobe pneumonia 02/16/2012   Encounter for well adult exam with abnormal findings 05/21/2011   ABDOMINAL PAIN OTHER SPECIFIED SITE 10/24/2010   INTERMITTENT VERTIGO 08/07/2009   Dehydration 01/11/2009   OSTEOPENIA 04/09/2008   PARESTHESIA 03/08/2008   Type 2 diabetes mellitus with hyperlipidemia (HCC) 07/03/2007   Hyperlipidemia 07/03/2007   OBESITY 07/03/2007   Anxiety state 07/03/2007   Depression 07/03/2007   Essential hypertension 07/03/2007   Allergic rhinitis  07/03/2007   GERD 07/03/2007   HIATAL HERNIA 07/03/2007   DIVERTICULOSIS, COLON 07/03/2007   OSTEOARTHRITIS 07/03/2007   History of colon polyps 07/03/2007    Current Outpatient Medications on File Prior to Visit  Medication Sig Dispense Refill   acetaminophen (TYLENOL) 500 MG tablet Take 500-1,000 mg by mouth every 8 (eight) hours as needed (for pain).     ALPRAZolam (XANAX) 0.25 MG tablet Take 1 tablet by mouth twice daily as needed for anxiety 60 tablet 5   atorvastatin (LIPITOR) 20 MG tablet Take 1 tablet (20 mg total) by mouth daily. 90 tablet 3   Blood Glucose Monitoring Suppl (ACCU-CHEK GUIDE ME) w/Device KIT Use as directed four times per day E11.9 1 kit 0   cetirizine (ZYRTEC) 5 MG tablet Take 1 tablet (5 mg total) by mouth daily. 30 tablet 11   Cholecalciferol (VITAMIN D3) 50 MCG (2000 UT) TABS Take 2,000 Units by mouth daily.     citalopram (CELEXA) 10 MG tablet Take 1 tablet by mouth once daily 90 tablet 2   clopidogrel (PLAVIX) 75 MG tablet Take 1 tablet by mouth once daily 90 tablet 0   fluticasone (FLONASE) 50 MCG/ACT nasal spray Place 2 sprays into both nostrils daily. 16 g 0   gabapentin (NEURONTIN) 300 MG capsule Take 1 capsule (300 mg total) by mouth 3 (three) times daily. 270 capsule 1   glimepiride (AMARYL) 2 MG tablet Take 1 tablet (2 mg total) by mouth daily before breakfast. 90 tablet 3   glucose  blood (ACCU-CHEK GUIDE) test strip Use as instructed four times per day  E11.9 400 each 12   Lancets MISC Use as directed four time per day E11.9 400 each 11   lisinopril (ZESTRIL) 5 MG tablet TAKE 1 TABLET BY MOUTH ONCE DAILY 90 tablet 3   meclizine (ANTIVERT) 25 MG tablet TAKE 1 TABLET BY MOUTH EVERY 6 HOURS AS NEEDED FOR DIZZINESS 30 tablet 0   omeprazole (PRILOSEC) 20 MG capsule TAKE 1 CAPSULE BY MOUTH TWICE DAILY BEFORE MEAL(S) 180 capsule 0   PARoxetine (PAXIL) 20 MG tablet Take 1 tablet (20 mg total) by mouth daily. 90 tablet 3   Polyethyl Glycol-Propyl Glycol  (SYSTANE OP) Place 1 drop into both eyes in the morning and at bedtime.     promethazine (PHENERGAN) 25 MG tablet TAKE ONE TABLET BY MOUTH EVERY 6 HOURS AS NEEDED FOR NAUSEA (Patient taking differently: Take 25 mg by mouth every 6 (six) hours as needed for nausea.) 40 tablet 0   traMADol (ULTRAM) 50 MG tablet Take 1 tablet (50 mg total) by mouth every 12 (twelve) hours as needed. 30 tablet 2   triamcinolone (NASACORT) 55 MCG/ACT AERO nasal inhaler Place 2 sprays into the nose daily. (Patient taking differently: Place 2 sprays into the nose daily as needed (for allergies or sinus issues).) 1 Inhaler 12   No current facility-administered medications on file prior to visit.    Past Medical History:  Diagnosis Date   ALLERGIC RHINITIS    ANXIETY    COLONIC POLYPS, HX OF    DEPRESSION    DIABETES MELLITUS, TYPE II    Diverticulitis    DIVERTICULOSIS, COLON    Esophageal stricture    Gallstones    GERD    HIATAL HERNIA    History of blood transfusion    left knee replacement   HYPERLIPIDEMIA    Hypersomnolence    HYPERTENSION    INTERMITTENT VERTIGO    OBESITY    OSTEOARTHRITIS    L knee   OSTEOPENIA    Pneumonia    Post-menopausal    hormone replacement therapy    Past Surgical History:  Procedure Laterality Date   ABDOMINAL HYSTERECTOMY     CESAREAN SECTION     CHOLECYSTECTOMY     COLONOSCOPY  03/02/2017   [name]   Lysis of Adhesions     OOPHORECTOMY     one   POLYPECTOMY     ROTATOR CUFF REPAIR Left    TOTAL KNEE ARTHROPLASTY Left     Social History   Socioeconomic History   Marital status: Married    Spouse name: Not on file   Number of children: 3   Years of education: Not on file   Highest education level: Not on file  Occupational History   Occupation: Control and instrumentation engineer  Tobacco Use   Smoking status: Never   Smokeless tobacco: Never  Vaping Use   Vaping Use: Never used  Substance and Sexual Activity   Alcohol use: No   Drug use: No   Sexual activity: Not  on file  Other Topics Concern   Not on file  Social History Narrative   Not on file   Social Determinants of Health   Financial Resource Strain: Not on file  Food Insecurity: Not on file  Transportation Needs: Not on file  Physical Activity: Not on file  Stress: Not on file  Social Connections: Not on file    Family History  Problem Relation Age of Onset  Hypertension Mother    Atrial fibrillation Mother    Heart disease Mother        CHF   Cancer Mother        Cancer in "thigh"   Ovarian cancer Sister    Lung cancer Brother        smoker   Heart failure Brother        CHF   Breast cancer Maternal Grandmother    Kidney disease Maternal Aunt    Colon cancer Paternal Aunt        originated as breast CA   Breast cancer Paternal Aunt    Diabetes Other        paternal 28 and uncle   Esophageal cancer Neg Hx    Rectal cancer Neg Hx    Stomach cancer Neg Hx    Colon polyps Neg Hx     Review of Systems  Constitutional:  Negative for appetite change and fever.  HENT:  Negative for congestion, ear pain, sinus pain and sore throat.   Respiratory:  Negative for cough and shortness of breath.   Gastrointestinal:  Positive for diarrhea. Negative for abdominal pain.  Neurological:  Negative for light-headedness and headaches.      Objective:   Vitals:   09/11/21 1036  BP: 102/68  Pulse: 78  Temp: 98.5 F (36.9 C)  SpO2: 99%   BP Readings from Last 3 Encounters:  09/11/21 102/68  07/30/21 104/72  07/01/21 130/80   Wt Readings from Last 3 Encounters:  09/11/21 163 lb (73.9 kg)  07/30/21 164 lb (74.4 kg)  07/01/21 171 lb 6.4 oz (77.7 kg)   Body mass index is 34.07 kg/m.   Physical Exam Constitutional:      General: She is not in acute distress.    Appearance: Normal appearance. She is not ill-appearing.  HENT:     Head: Normocephalic and atraumatic.  Cardiovascular:     Rate and Rhythm: Normal rate and regular rhythm.  Pulmonary:     Effort:  Pulmonary effort is normal. No respiratory distress.     Breath sounds: No wheezing or rales.  Abdominal:     General: There is no distension.     Palpations: Abdomen is soft.     Tenderness: There is no abdominal tenderness. There is no guarding or rebound.     Comments: Increased bowel sounds  Musculoskeletal:     Right lower leg: No edema.     Left lower leg: No edema.  Skin:    General: Skin is warm and dry.  Neurological:     Mental Status: She is alert.           Assessment & Plan:    Diarrhea: Acute  Started about two weeks and is much better but still has diarrhea Initially likely infectious - her and her husband had it and he has recovered She is concerned diarrhea is now related to her metformin Criss Rosales / BRAT diet Continue good hydration Stop metformin Call if diarrhea does not resolve  Diabetes: Chronic Has been well controlled ? Metformin causing diarrhea - it is possible Since GFR getting on low side and ? Diarrhea related to metformin - will d/c metformin Start rybelsus 3 mg daily Continue glimepiride 2 mg daily Diabetic diet Has f/u with PCP in 6 weeks  - will call sooner if needed

## 2021-09-11 ENCOUNTER — Ambulatory Visit (INDEPENDENT_AMBULATORY_CARE_PROVIDER_SITE_OTHER): Payer: Medicare Other | Admitting: Internal Medicine

## 2021-09-11 ENCOUNTER — Encounter: Payer: Self-pay | Admitting: Internal Medicine

## 2021-09-11 ENCOUNTER — Other Ambulatory Visit: Payer: Self-pay

## 2021-09-11 VITALS — BP 102/68 | HR 78 | Temp 98.5°F | Ht <= 58 in | Wt 163.0 lb

## 2021-09-11 DIAGNOSIS — E785 Hyperlipidemia, unspecified: Secondary | ICD-10-CM

## 2021-09-11 DIAGNOSIS — R197 Diarrhea, unspecified: Secondary | ICD-10-CM | POA: Diagnosis not present

## 2021-09-11 DIAGNOSIS — E1169 Type 2 diabetes mellitus with other specified complication: Secondary | ICD-10-CM | POA: Diagnosis not present

## 2021-09-11 MED ORDER — RYBELSUS 3 MG PO TABS: 3.0000 mg | ORAL_TABLET | Freq: Every day | ORAL | 2 refills | Status: DC

## 2021-09-11 NOTE — Patient Instructions (Addendum)
° ° °  Medications changes include :   stop metformin and start rybelsus 3 mg daily  Your prescription(s) have been submitted to your pharmacy. Please take as directed and contact our office if you believe you are having problem(s) with the medication(s).  Follow up with Dr Jenny Reichmann next month

## 2021-10-16 ENCOUNTER — Other Ambulatory Visit: Payer: Self-pay | Admitting: Internal Medicine

## 2021-10-16 NOTE — Telephone Encounter (Signed)
Please refill as per office routine med refill policy (all routine meds to be refilled for 3 mo or monthly (per pt preference) up to one year from last visit, then month to month grace period for 3 mo, then further med refills will have to be denied) ? ?

## 2021-10-23 ENCOUNTER — Ambulatory Visit: Payer: Medicare Other | Admitting: Internal Medicine

## 2021-10-26 ENCOUNTER — Other Ambulatory Visit: Payer: Self-pay | Admitting: Internal Medicine

## 2021-11-02 IMAGING — MR MR MRA HEAD W/O CM
3 series · 19 of 48 positions shown · non-contrast
Comparison: None.

CLINICAL DATA: Left-sided numbness



[Series 3: ax (id) · axial · 1.0mm · 0.43mm/px · z∈[-122,-42]mm · 17 of 176 slices shown]
[im 1/176]
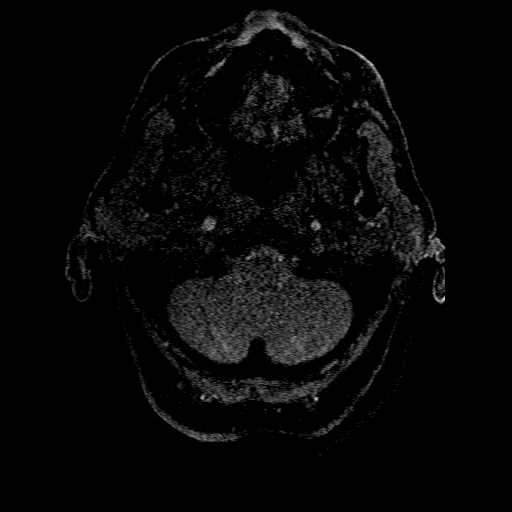
[im 4/176]
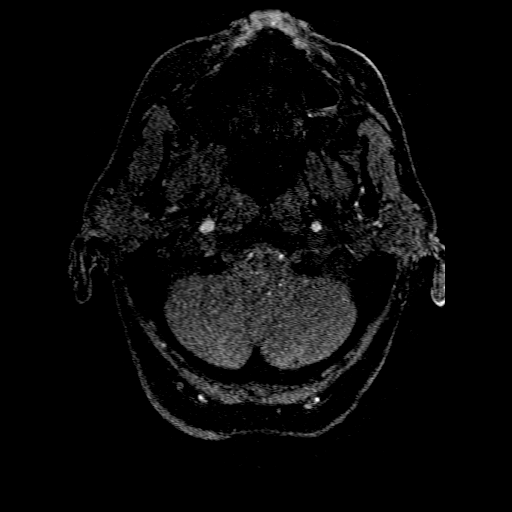
[im 8/176]
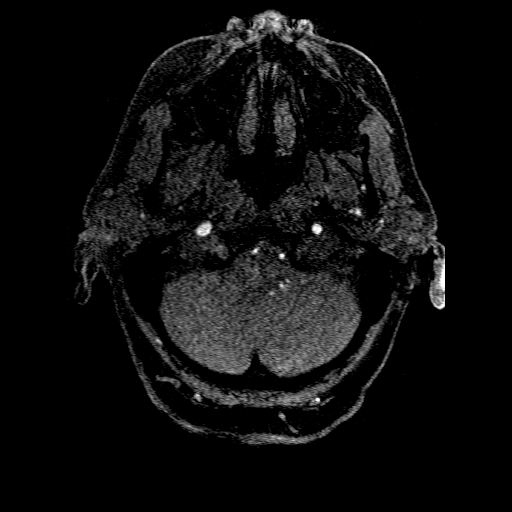
[im 12/176]
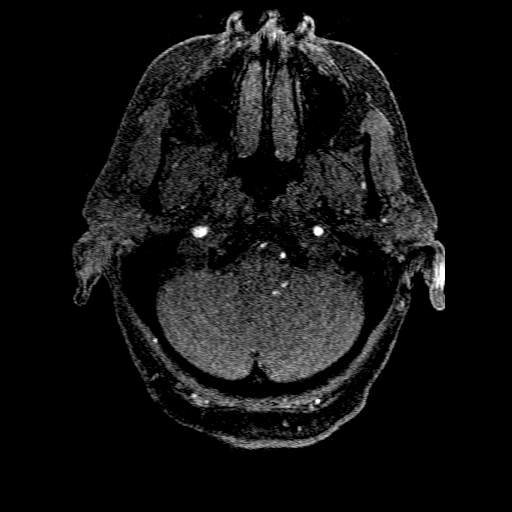
[im 16/176]
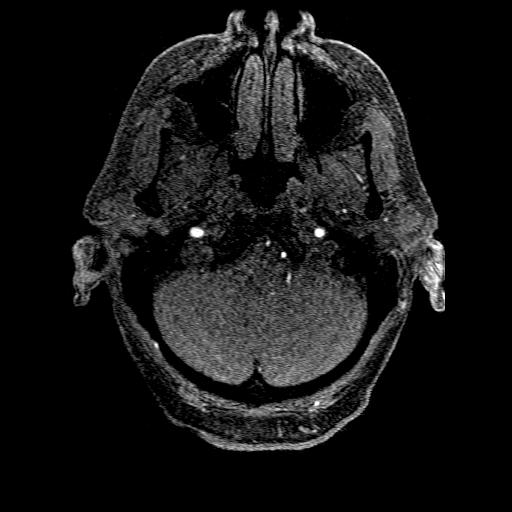
[im 20/176]
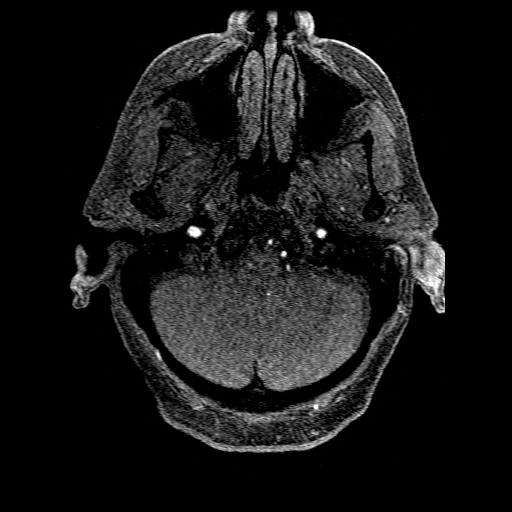
[im 24/176]
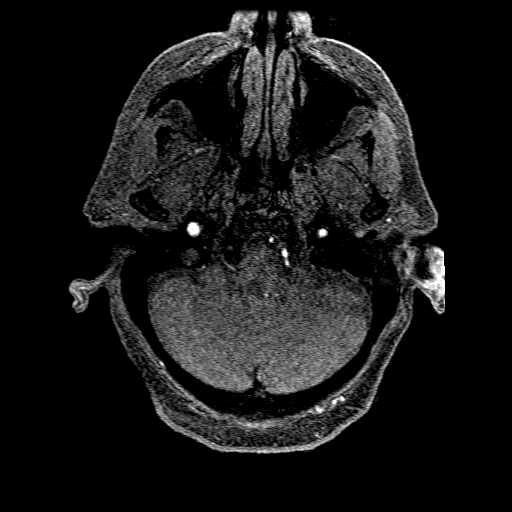
[im 28/176]
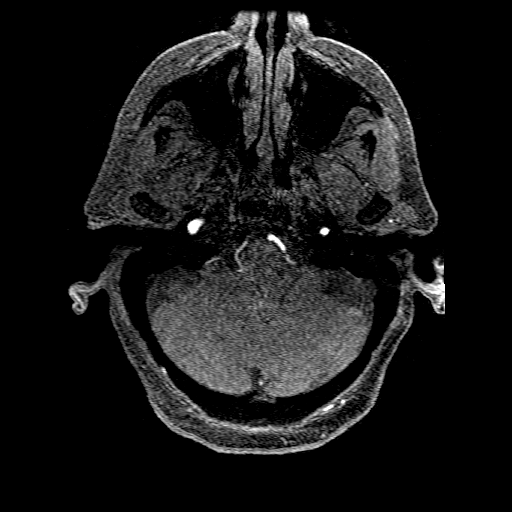
[im 32/176]
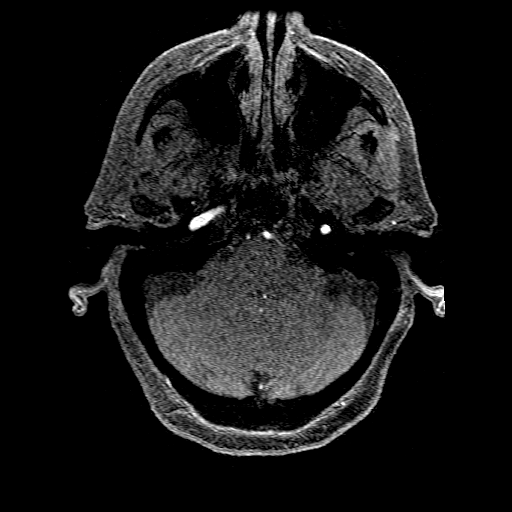
[im 55/176]
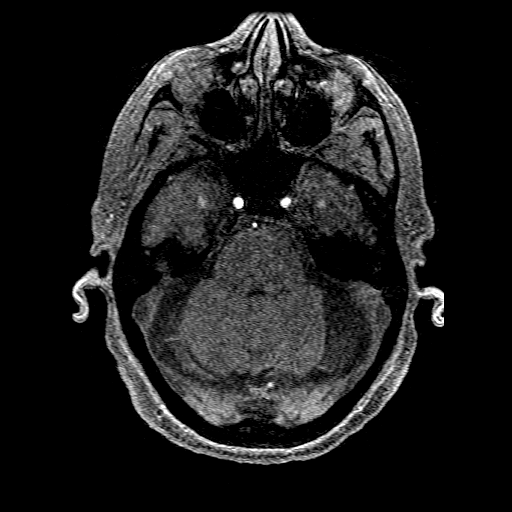
[im 78/176]
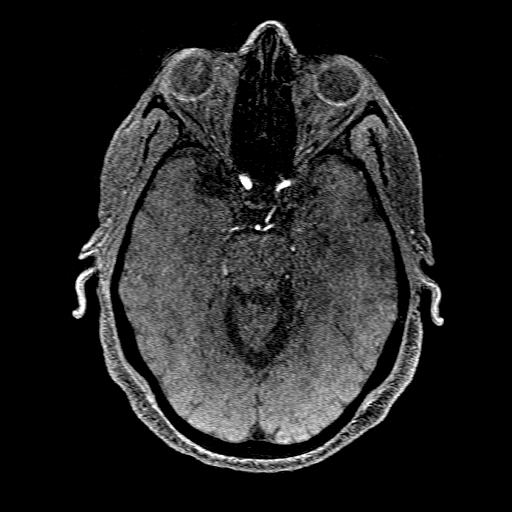
[im 90/176]
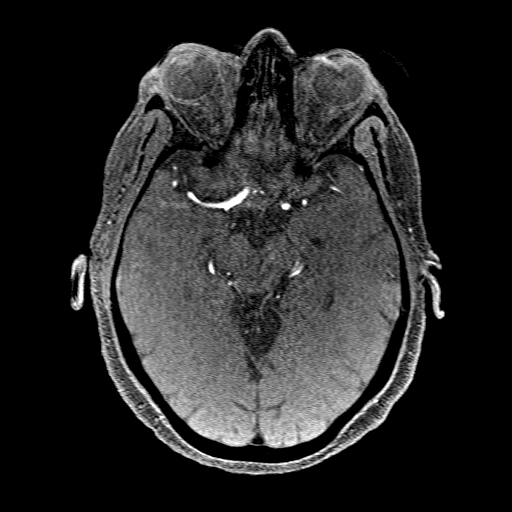
[im 98/176]
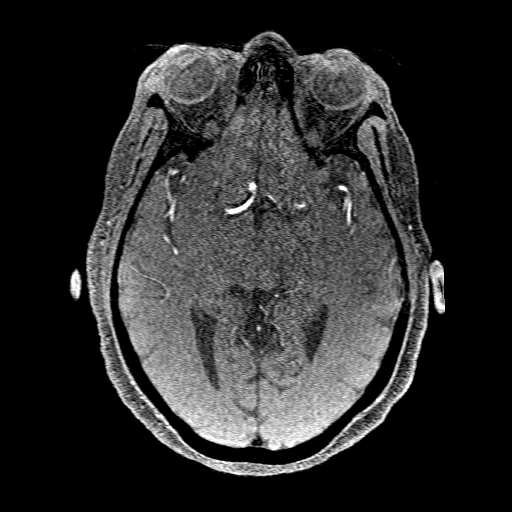
[im 121/176]
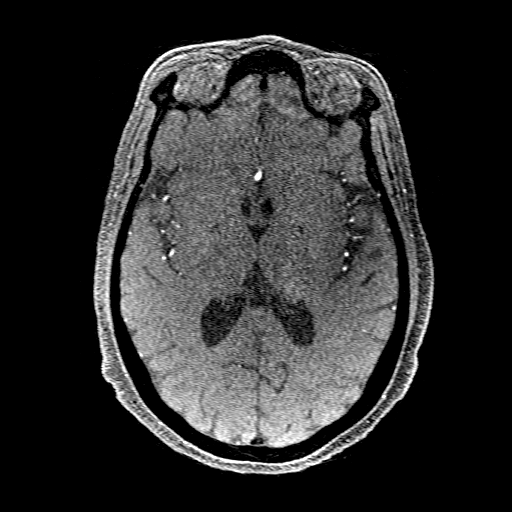
[im 144/176]
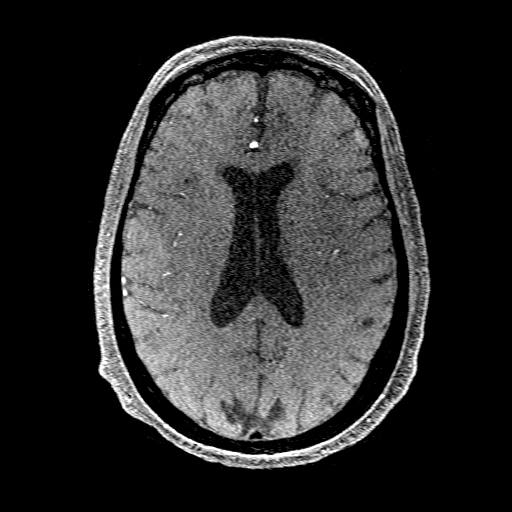
[im 148/176]
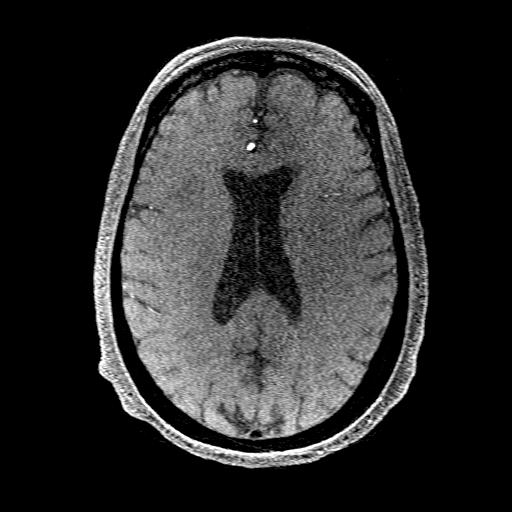
[im 168/176]
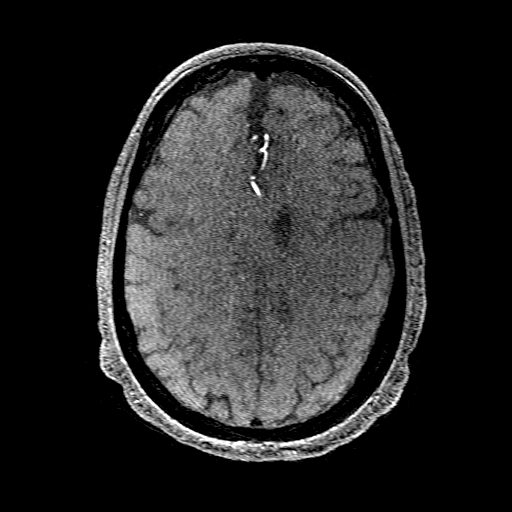

[Series 300: col:ax (id) · axial · 1.0mm · 0.43mm/px · 1 of 1 slices shown]
[im 1/1]
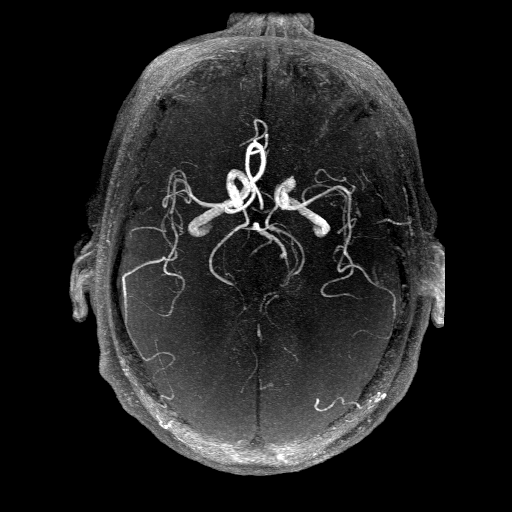

[Series 301: pjn:ax (id) · sagittal · 1.0mm · 0.43mm/px · 1 of 4 slices shown]
[im 1/4]
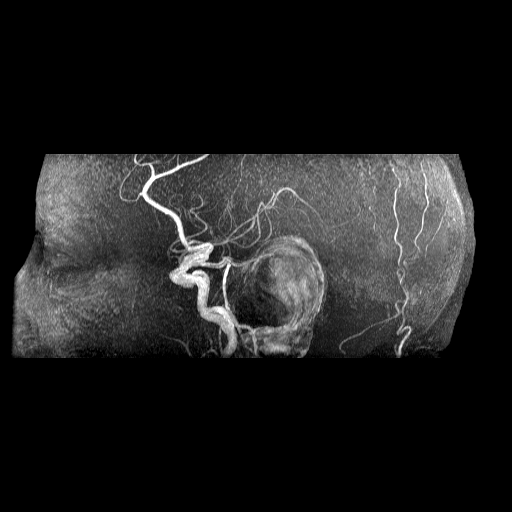

[19 of 48 positions shown; findings below may reference images not displayed]

FINDINGS: MRI HEAD FINDINGS

Brain: No acute infarct, mass effect or extra-axial collection. No
acute or chronic hemorrhage. Single focus of white matter
hyperintensity. Normal ventricles and extra-axial CSF spaces. The
midline structures are normal.

Vascular: Major flow voids are preserved.

Skull and upper cervical spine: Normal calvarium and skull base.
Visualized upper cervical spine and soft tissues are normal.

Sinuses/Orbits:No paranasal sinus fluid levels or advanced mucosal
thickening. No mastoid or middle ear effusion. Normal orbits.

MRA HEAD FINDINGS

POSTERIOR CIRCULATION:

--Vertebral arteries: Normal

--Inferior cerebellar arteries: Normal.

--Basilar artery: Normal.

--Superior cerebellar arteries: Normal.

--Posterior cerebral arteries: Normal.

ANTERIOR CIRCULATION:

--Intracranial internal carotid arteries: Normal.

--Anterior cerebral arteries (ACA): Normal.

--Middle cerebral arteries (MCA): Normal.

ANATOMIC VARIANTS: None

MRA NECK FINDINGS

Limited time-of-flight imaging of the cervical carotid and vertebral
arteries is normal.
IMPRESSION: 1. No acute intracranial abnormality.
2. Normal intracranial MRA.
3. Limited time-of-flight MRA of the neck is normal.

## 2021-11-12 ENCOUNTER — Other Ambulatory Visit: Payer: Self-pay

## 2021-11-12 ENCOUNTER — Other Ambulatory Visit: Payer: Self-pay | Admitting: Internal Medicine

## 2021-11-12 ENCOUNTER — Encounter: Payer: Self-pay | Admitting: Internal Medicine

## 2021-11-12 ENCOUNTER — Ambulatory Visit (INDEPENDENT_AMBULATORY_CARE_PROVIDER_SITE_OTHER): Payer: Medicare Other | Admitting: Internal Medicine

## 2021-11-12 VITALS — BP 128/78 | HR 80 | Ht <= 58 in | Wt 167.6 lb

## 2021-11-12 DIAGNOSIS — E559 Vitamin D deficiency, unspecified: Secondary | ICD-10-CM

## 2021-11-12 DIAGNOSIS — N1831 Chronic kidney disease, stage 3a: Secondary | ICD-10-CM | POA: Diagnosis not present

## 2021-11-12 DIAGNOSIS — I10 Essential (primary) hypertension: Secondary | ICD-10-CM

## 2021-11-12 DIAGNOSIS — Z0001 Encounter for general adult medical examination with abnormal findings: Secondary | ICD-10-CM

## 2021-11-12 DIAGNOSIS — E1169 Type 2 diabetes mellitus with other specified complication: Secondary | ICD-10-CM

## 2021-11-12 DIAGNOSIS — E785 Hyperlipidemia, unspecified: Secondary | ICD-10-CM | POA: Diagnosis not present

## 2021-11-12 DIAGNOSIS — E782 Mixed hyperlipidemia: Secondary | ICD-10-CM

## 2021-11-12 DIAGNOSIS — E538 Deficiency of other specified B group vitamins: Secondary | ICD-10-CM

## 2021-11-12 DIAGNOSIS — N184 Chronic kidney disease, stage 4 (severe): Secondary | ICD-10-CM

## 2021-11-12 LAB — BASIC METABOLIC PANEL
BUN: 27 mg/dL — ABNORMAL HIGH (ref 6–23)
CO2: 25 mEq/L (ref 19–32)
Calcium: 9.9 mg/dL (ref 8.4–10.5)
Chloride: 105 mEq/L (ref 96–112)
Creatinine, Ser: 1.95 mg/dL — ABNORMAL HIGH (ref 0.40–1.20)
GFR: 24.74 mL/min — ABNORMAL LOW (ref >60.00)
Glucose, Bld: 61 mg/dL — ABNORMAL LOW (ref 70–99)
Potassium: 4.8 mEq/L (ref 3.5–5.1)
Sodium: 136 mEq/L (ref 135–145)

## 2021-11-12 LAB — CBC WITH DIFFERENTIAL/PLATELET
Basophils Absolute: 0 10*3/uL (ref 0.0–0.1)
Basophils Relative: 0.5 % (ref 0.0–3.0)
Eosinophils Absolute: 0.1 10*3/uL (ref 0.0–0.7)
Eosinophils Relative: 2.5 % (ref 0.0–5.0)
HCT: 32.4 % — ABNORMAL LOW (ref 36.0–46.0)
Hemoglobin: 10.3 g/dL — ABNORMAL LOW (ref 12.0–15.0)
Lymphocytes Relative: 45.1 % (ref 12.0–46.0)
Lymphs Abs: 2.6 10*3/uL (ref 0.7–4.0)
MCHC: 31.9 g/dL (ref 30.0–36.0)
MCV: 88 fl (ref 78.0–100.0)
Monocytes Absolute: 0.5 10*3/uL (ref 0.1–1.0)
Monocytes Relative: 9.2 % (ref 3.0–12.0)
Neutro Abs: 2.4 10*3/uL (ref 1.4–7.7)
Neutrophils Relative %: 42.7 % — ABNORMAL LOW (ref 43.0–77.0)
Platelets: 238 10*3/uL (ref 150.0–400.0)
RBC: 3.69 Mil/uL — ABNORMAL LOW (ref 3.87–5.11)
RDW: 13.8 % (ref 11.5–15.5)
WBC: 5.7 10*3/uL (ref 4.0–10.5)

## 2021-11-12 LAB — HEPATIC FUNCTION PANEL
ALT: 11 U/L (ref 0–35)
AST: 15 U/L (ref 0–37)
Albumin: 4.2 g/dL (ref 3.5–5.2)
Alkaline Phosphatase: 48 U/L (ref 39–117)
Bilirubin, Direct: 0.1 mg/dL (ref 0.0–0.3)
Total Bilirubin: 0.5 mg/dL (ref 0.2–1.2)
Total Protein: 6.8 g/dL (ref 6.0–8.3)

## 2021-11-12 LAB — LIPID PANEL
Cholesterol: 149 mg/dL (ref 0–200)
HDL: 54.7 mg/dL (ref >39.00)
LDL Cholesterol: 64 mg/dL (ref 0–99)
NonHDL: 94.55
Total CHOL/HDL Ratio: 3
Triglycerides: 154 mg/dL — ABNORMAL HIGH (ref 0.0–149.0)
VLDL: 30.8 mg/dL (ref 0.0–40.0)

## 2021-11-12 LAB — MICROALBUMIN / CREATININE URINE RATIO
Creatinine,U: 121.4 mg/dL
Microalb Creat Ratio: 1.8 mg/g (ref 0.0–30.0)
Microalb, Ur: 2.1 mg/dL — ABNORMAL HIGH (ref 0.0–1.9)

## 2021-11-12 LAB — URINALYSIS, ROUTINE W REFLEX MICROSCOPIC
Bilirubin Urine: NEGATIVE
Hgb urine dipstick: NEGATIVE
Ketones, ur: NEGATIVE
Leukocytes,Ua: NEGATIVE
Nitrite: NEGATIVE
RBC / HPF: NONE SEEN (ref 0–?)
Specific Gravity, Urine: 1.025 (ref 1.000–1.030)
Urine Glucose: NEGATIVE
Urobilinogen, UA: 0.2 (ref 0.0–1.0)
pH: 5.5 (ref 5.0–8.0)

## 2021-11-12 LAB — HEMOGLOBIN A1C: Hgb A1c MFr Bld: 6.7 % — ABNORMAL HIGH (ref 4.6–6.5)

## 2021-11-12 LAB — VITAMIN B12: Vitamin B-12: 269 pg/mL (ref 211–911)

## 2021-11-12 LAB — VITAMIN D 25 HYDROXY (VIT D DEFICIENCY, FRACTURES): VITD: 46.88 ng/mL (ref 30.00–100.00)

## 2021-11-12 LAB — TSH: TSH: 4.17 u[IU]/mL (ref 0.35–5.50)

## 2021-11-12 NOTE — Progress Notes (Signed)
Patient ID: Cindy Larson, female   DOB: 04/03/1946, 76 y.o.   MRN: [id number]         Chief Complaint:: wellness exam and Follow-up  Ckd, dm, htn, hld       HPI:  Cindy Larson is a 76 y.o. female here for wellness exam; has eye appt next wk, plans to call mammogram soon; declines covid booster, o/w up to date                        Also never took the rybelsus as recommended per Dr Lawerance Bach, but Pt denies chest pain, increased sob or doe, wheezing, orthopnea, PND, increased LE swelling, palpitations, dizziness or syncope.   Pt denies polydipsia, polyuria, or new focal neuro s/s.    Pt denies fever, wt loss, night sweats, loss of appetite, or other constitutional symptoms   Has ongoing CKD with trending lower recently.  Pt continues to have recurring LBP without change in severity, bowel or bladder change, fever, wt loss,  worsening LE pain/numbness/weakness, gait change or falls.  No other new complaints   Wt Readings from Last 3 Encounters:  11/12/21 167 lb 9.6 oz (76 kg)  09/11/21 163 lb (73.9 kg)  07/30/21 164 lb (74.4 kg)   BP Readings from Last 3 Encounters:  11/12/21 128/78  09/11/21 102/68  07/30/21 104/72   Immunization History  Administered Date(s) Administered   Fluad Quad(high Dose 65+) 05/24/2019, 06/15/2020, 06/16/2021   Influenza Split 06/29/2012   Influenza Whole 07/18/2003, 06/22/2008   Influenza, High Dose Seasonal PF 06/15/2013, 06/25/2015, 06/09/2016, 06/17/2017, 06/01/2018   Influenza,inj,Quad PF,6+ Mos 06/15/2014   PFIZER Comirnaty(Gray Top)Covid-19 Tri-Sucrose Vaccine 01/08/2021   PFIZER(Purple Top)SARS-COV-2 Vaccination 10/13/2019, 11/05/2019, 06/04/2020   Pneumococcal Conjugate-13 06/29/2013   Pneumococcal Polysaccharide-23 08/17/2005, 10/24/2010, 08/04/2016   Td 10/24/2010   Tdap 11/09/2021   Zoster Recombinat (Shingrix) 11/09/2021   There are no preventive care reminders to display for this patient.     Past Medical History:   Diagnosis Date   ALLERGIC RHINITIS    ANXIETY    COLONIC POLYPS, HX OF    DEPRESSION    DIABETES MELLITUS, TYPE II    Diverticulitis    DIVERTICULOSIS, COLON    Esophageal stricture    Gallstones    GERD    HIATAL HERNIA    History of blood transfusion    left knee replacement   HYPERLIPIDEMIA    Hypersomnolence    HYPERTENSION    INTERMITTENT VERTIGO    OBESITY    OSTEOARTHRITIS    L knee   OSTEOPENIA    Pneumonia    Post-menopausal    hormone replacement therapy   Past Surgical History:  Procedure Laterality Date   ABDOMINAL HYSTERECTOMY     CESAREAN SECTION     CHOLECYSTECTOMY     COLONOSCOPY  03/02/2017   Marina Goodell   Lysis of Adhesions     OOPHORECTOMY     one   POLYPECTOMY     ROTATOR CUFF REPAIR Left    TOTAL KNEE ARTHROPLASTY Left     reports that she has never smoked. She has never used smokeless tobacco. She reports that she does not drink alcohol and does not use drugs. family history includes Atrial fibrillation in her mother; Breast cancer in her maternal grandmother and paternal aunt; Cancer in her mother; Colon cancer in her paternal aunt; Diabetes in an other family member; Heart disease in her mother; Heart failure in her brother;  Hypertension in her mother; Kidney disease in her maternal aunt; Lung cancer in her brother; Ovarian cancer in her sister. Allergies  Allergen Reactions   Aspirin Nausea Only and Other (See Comments)    Made the stomach "hurt"   Fexofenadine Other (See Comments)    "This makes me feel badly. I didn't feel well when I took it"   Nsaids Nausea Only and Other (See Comments)    The patient CAN tolerate Tramadol and Tylenol- no Aleve or Motrin, though   Penicillins Hives and Other (See Comments)    Blisters, also   Prevnar [Pneumococcal 13-Val Conj Vacc] Swelling and Other (See Comments)    Left arm became swollen and welts appeared   Zocor [Simvastatin] Itching   Current Outpatient Medications on File Prior to Visit   Medication Sig Dispense Refill   acetaminophen (TYLENOL) 500 MG tablet Take 500-1,000 mg by mouth every 8 (eight) hours as needed (for pain).     ALPRAZolam (XANAX) 0.25 MG tablet Take 1 tablet by mouth twice daily as needed for anxiety 60 tablet 5   atorvastatin (LIPITOR) 20 MG tablet Take 1 tablet (20 mg total) by mouth daily. 90 tablet 3   Blood Glucose Monitoring Suppl (ACCU-CHEK GUIDE ME) w/Device KIT Use as directed four times per day E11.9 1 kit 0   cetirizine (ZYRTEC) 5 MG tablet Take 1 tablet (5 mg total) by mouth daily. 30 tablet 11   Cholecalciferol (VITAMIN D3) 50 MCG (2000 UT) TABS Take 2,000 Units by mouth daily.     citalopram (CELEXA) 10 MG tablet Take 1 tablet by mouth once daily 90 tablet 2   clopidogrel (PLAVIX) 75 MG tablet Take 1 tablet by mouth once daily 90 tablet 0   fluticasone (FLONASE) 50 MCG/ACT nasal spray Place 2 sprays into both nostrils daily. 16 g 0   gabapentin (NEURONTIN) 300 MG capsule Take 1 capsule (300 mg total) by mouth 3 (three) times daily. 270 capsule 1   glimepiride (AMARYL) 2 MG tablet Take 1 tablet (2 mg total) by mouth daily before breakfast. 90 tablet 3   glucose blood (ACCU-CHEK GUIDE) test strip Use as instructed four times per day  E11.9 400 each 12   Lancets MISC Use as directed four time per day E11.9 400 each 11   lisinopril (ZESTRIL) 5 MG tablet TAKE 1 TABLET BY MOUTH ONCE DAILY 90 tablet 3   meclizine (ANTIVERT) 25 MG tablet TAKE 1 TABLET BY MOUTH EVERY 6 HOURS AS NEEDED FOR DIZZINESS 30 tablet 0   omeprazole (PRILOSEC) 20 MG capsule TAKE 1 CAPSULE BY MOUTH TWICE DAILY BEFORE MEAL(S) 180 capsule 3   PARoxetine (PAXIL) 20 MG tablet Take 1 tablet (20 mg total) by mouth daily. 90 tablet 3   Polyethyl Glycol-Propyl Glycol (SYSTANE OP) Place 1 drop into both eyes in the morning and at bedtime.     promethazine (PHENERGAN) 25 MG tablet TAKE ONE TABLET BY MOUTH EVERY 6 HOURS AS NEEDED FOR NAUSEA (Patient taking differently: Take 25 mg by mouth  every 6 (six) hours as needed for nausea.) 40 tablet 0   traMADol (ULTRAM) 50 MG tablet TAKE 1 TABLET BY MOUTH EVERY 12 HOURS AS NEEDED 30 tablet 2   triamcinolone (NASACORT) 55 MCG/ACT AERO nasal inhaler Place 2 sprays into the nose daily. (Patient taking differently: Place 2 sprays into the nose daily as needed (for allergies or sinus issues).) 1 Inhaler 12   No current facility-administered medications on file prior to visit.  ROS:  All others reviewed and negative.  Objective        PE:  BP 128/78 (BP Location: Right Arm, Patient Position: Sitting, Cuff Size: Large)   Pulse 80   Ht 4\' 10"  (1.473 m)   Wt 167 lb 9.6 oz (76 kg)   SpO2 100%   BMI 35.03 kg/m                 Constitutional: Pt appears in NAD               HENT: Head: NCAT.                Right Ear: External ear normal.                 Left Ear: External ear normal.                Eyes: . Pupils are equal, round, and reactive to light. Conjunctivae and EOM are normal               Nose: without d/c or deformity               Neck: Neck supple. Gross normal ROM               Cardiovascular: Normal rate and regular rhythm.                 Pulmonary/Chest: Effort normal and breath sounds without rales or wheezing.                Abd:  Soft, NT, ND, + BS, no organomegaly               Neurological: Pt is alert. At baseline orientation, motor grossly intact               Skin: Skin is warm. No rashes, no other new lesions, LE edema - none               Psychiatric: Pt behavior is normal without agitation   Micro: none  Cardiac tracings I have personally interpreted today:  none  Pertinent Radiological findings (summarize): none   Lab Results  Component Value Date   WBC 5.7 11/12/2021   HGB 10.3 (L) 11/12/2021   HCT 32.4 (L) 11/12/2021   PLT 238.0 11/12/2021   GLUCOSE 61 (L) 11/12/2021   CHOL 149 11/12/2021   TRIG 154.0 (H) 11/12/2021   HDL 54.70 11/12/2021   LDLDIRECT 83.0 04/21/2021   LDLCALC 64  11/12/2021   ALT 11 11/12/2021   AST 15 11/12/2021   NA 136 11/12/2021   K 4.8 11/12/2021   CL 105 11/12/2021   CREATININE 1.95 (H) 11/12/2021   BUN 27 (H) 11/12/2021   CO2 25 11/12/2021   TSH 4.17 11/12/2021   INR 0.9 12/14/2020   HGBA1C 6.7 (H) 11/12/2021   MICROALBUR 2.1 (H) 11/12/2021   Assessment/Plan:  Cindy Larson is a 76 y.o. Black or African American [2] female with  has a past medical history of ALLERGIC RHINITIS, ANXIETY, COLONIC POLYPS, HX OF, DEPRESSION, DIABETES MELLITUS, TYPE II, Diverticulitis, DIVERTICULOSIS, COLON, Esophageal stricture, Gallstones, GERD, HIATAL HERNIA, History of blood transfusion, HYPERLIPIDEMIA, Hypersomnolence, HYPERTENSION, INTERMITTENT VERTIGO, OBESITY, OSTEOARTHRITIS, OSTEOPENIA, Pneumonia, and Post-menopausal.  Encounter for well adult exam with abnormal findings Age and sex appropriate education and counseling updated with regular exercise and diet Referrals for preventative services - has eye appt next wk, plans to call for mammogram soon  Immunizations addressed -  declines covid booster Smoking counseling  - none needed Evidence for depression or other mood disorder - none significant Most recent labs reviewed. I have personally reviewed and have noted: 1) the patient's medical and social history 2) The patient's current medications and supplements 3) The patient's height, weight, and BMI have been recorded in the chart   Type 2 diabetes mellitus with hyperlipidemia (HCC) Lab Results  Component Value Date   HGBA1C 6.7 (H) 11/12/2021   Mild uncontrolled, pt to continue current medical treatment glimeparide, but d/c metfmormin due to worsening ckd  Hyperlipidemia Lab Results  Component Value Date   LDLCALC 64 11/12/2021   Stable, pt to continue current statin lipitor   Essential hypertension BP Readings from Last 3 Encounters:  11/12/21 128/78  09/11/21 102/68  07/30/21 104/72   Stable, pt to continue medical  treatment lisinopril 5   CKD (chronic kidney disease) Lab Results  Component Value Date   CREATININE 1.95 (H) 11/12/2021   Stable overall, cont to avoid nephrotoxins, also refer nephrology  Followup: No follow-ups on file.  Oliver Barre, MD 11/15/2021 8:11 AM [facility]

## 2021-11-12 NOTE — Patient Instructions (Signed)

## 2021-11-15 NOTE — Assessment & Plan Note (Signed)
BP Readings from Last 3 Encounters:  ?11/12/21 128/78  ?09/11/21 102/68  ?07/30/21 104/72  ? ?Stable, pt to continue medical treatment lisinopril 5 ? ?

## 2021-11-15 NOTE — Assessment & Plan Note (Signed)
Lab Results  ?Component Value Date  ? [place] 64 11/12/2021  ? ?Stable, pt to continue current statin lipitor ? ?

## 2021-11-15 NOTE — Assessment & Plan Note (Signed)
Age and sex appropriate education and counseling updated with regular exercise and diet ?Referrals for preventative services - has eye appt next wk, plans to call for mammogram soon  ?Immunizations addressed - declines covid booster ?Smoking counseling  - none needed ?Evidence for depression or other mood disorder - none significant ?Most recent labs reviewed. ?I have personally reviewed and have noted: ?1) the patient's medical and social history ?2) The patient's current medications and supplements ?3) The patient's height, weight, and BMI have been recorded in the chart ? ?

## 2021-11-15 NOTE — Assessment & Plan Note (Signed)
Lab Results  ?Component Value Date  ? CREATININE 1.95 (H) 11/12/2021  ? ?Stable overall, cont to avoid nephrotoxins, also refer nephrology ?

## 2021-11-15 NOTE — Assessment & Plan Note (Signed)
Lab Results  ?Component Value Date  ? HGBA1C 6.7 (H) 11/12/2021  ? ?Mild uncontrolled, pt to continue current medical treatment glimeparide, but d/c metfmormin due to worsening ckd ?

## 2021-11-19 ENCOUNTER — Other Ambulatory Visit: Payer: Self-pay | Admitting: Adult Health

## 2021-11-19 DIAGNOSIS — H402234 Chronic angle-closure glaucoma, bilateral, indeterminate stage: Secondary | ICD-10-CM | POA: Diagnosis not present

## 2021-11-19 DIAGNOSIS — H25813 Combined forms of age-related cataract, bilateral: Secondary | ICD-10-CM | POA: Diagnosis not present

## 2021-11-22 ENCOUNTER — Other Ambulatory Visit: Payer: Self-pay | Admitting: Adult Health

## 2021-11-24 ENCOUNTER — Other Ambulatory Visit: Payer: Self-pay | Admitting: Internal Medicine

## 2021-11-24 DIAGNOSIS — Z1231 Encounter for screening mammogram for malignant neoplasm of breast: Secondary | ICD-10-CM

## 2021-11-25 ENCOUNTER — Other Ambulatory Visit: Payer: Self-pay | Admitting: Adult Health

## 2021-11-26 ENCOUNTER — Telehealth: Payer: Self-pay | Admitting: Adult Health

## 2021-11-26 ENCOUNTER — Telehealth: Payer: Self-pay | Admitting: Internal Medicine

## 2021-11-26 MED ORDER — CLOPIDOGREL BISULFATE 75 MG PO TABS: 75.0000 mg | ORAL_TABLET | Freq: Every day | ORAL | 1 refills | Status: DC

## 2021-11-26 NOTE — Telephone Encounter (Signed)
Thank you :)

## 2021-11-26 NOTE — Telephone Encounter (Signed)
Pt has been told that her insurance will not pay for the clopidogrel (PLAVIX) 75 MG tablet , pt would like to know if something else is available to replace this or if she can just do without the medication, please call. ?

## 2021-11-26 NOTE — Telephone Encounter (Addendum)
[employer], spoke with [facility] who stated they just need a refill, no PA needed. I advised will send refill in. Refilled plavix x 6 months. Called patient and informed her NP refilled x 6 months, however she was released back to PCP because she is stable. She needs to call PCP for further refills. Patient verbalized understanding, appreciation. ? ?

## 2021-11-26 NOTE — Telephone Encounter (Signed)
Pt checking status of nephrology referral ? ?Pt requesting a cb w/ a status update ? ? ?

## 2021-11-26 NOTE — Addendum Note (Signed)
Addended by: Minna Antis on: 11/26/2021 02:11 PM ? ? Modules accepted: Orders ? ?

## 2021-11-26 NOTE — Telephone Encounter (Signed)
I have never seen insurance deny plavix - can give her info for good rx but would recommend she contact her insurance company to see reason for them not paying for plavix as she did have a TIA despite aspirin use. If unable to afford or for whatever reason unable to use good rx, would recommend at least resuming aspirin at '325mg'$  daily dosing.  ?

## 2021-12-02 ENCOUNTER — Ambulatory Visit
Admission: RE | Admit: 2021-12-02 | Discharge: 2021-12-02 | Disposition: A | Discharge status: Home / Self Care | Payer: Medicare Other | Source: Ambulatory Visit | Attending: Internal Medicine | Admitting: Internal Medicine

## 2021-12-02 DIAGNOSIS — Z1231 Encounter for screening mammogram for malignant neoplasm of breast: Secondary | ICD-10-CM

## 2021-12-14 ENCOUNTER — Other Ambulatory Visit: Payer: Self-pay | Admitting: Internal Medicine

## 2021-12-14 NOTE — Telephone Encounter (Signed)
Please refill as per office routine med refill policy (all routine meds to be refilled for 3 mo or monthly (per pt preference) up to one year from last visit, then month to month grace period for 3 mo, then further med refills will have to be denied) ? ?

## 2021-12-22 ENCOUNTER — Ambulatory Visit (INDEPENDENT_AMBULATORY_CARE_PROVIDER_SITE_OTHER): Payer: Medicare Other

## 2021-12-22 DIAGNOSIS — Z0000 Encounter for general adult medical examination without abnormal findings: Secondary | ICD-10-CM | POA: Diagnosis not present

## 2021-12-22 NOTE — Patient Instructions (Signed)
Cindy Larson , ?Thank you for taking time to come for your Medicare Wellness Visit. I appreciate your ongoing commitment to your health goals. Please review the following plan we discussed and let me know if I can assist you in the future.  ? ?Screening recommendations/referrals: ?Colonoscopy: no longer required  ?Mammogram: no longer required  ?Bone Density: 05/203/2016 ?Recommended yearly ophthalmology/optometry visit for glaucoma screening and checkup ?Recommended yearly dental visit for hygiene and checkup ? ?Vaccinations: ?Influenza vaccine: completed  ?Pneumococcal vaccine: completed  ?Tdap vaccine: 11/09/2021 ?Shingles vaccine: completed    ? ?Advanced directives: none  ? ?Conditions/risks identified: none  ? ?Next appointment: none  ? ? ?Preventive Care 76 Years and Older, Female ?Preventive care refers to lifestyle choices and visits with your health care provider that can promote health and wellness. ?What does preventive care include? ?A yearly physical exam. This is also called an annual well check. ?Dental exams once or twice a year. ?Routine eye exams. Ask your health care provider how often you should have your eyes checked. ?Personal lifestyle choices, including: ?Daily care of your teeth and gums. ?Regular physical activity. ?Eating a healthy diet. ?Avoiding tobacco and drug use. ?Limiting alcohol use. ?Practicing safe sex. ?Taking low-dose aspirin every day. ?Taking vitamin and mineral supplements as recommended by your health care provider. ?What happens during an annual well check? ?The services and screenings done by your health care provider during your annual well check will depend on your age, overall health, lifestyle risk factors, and family history of disease. ?Counseling  ?Your health care provider may ask you questions about your: ?Alcohol use. ?Tobacco use. ?Drug use. ?Emotional well-being. ?Home and relationship well-being. ?Sexual activity. ?Eating habits. ?History of falls. ?Memory  and ability to understand (cognition). ?Work and work Statistician. ?Reproductive health. ?Screening  ?You may have the following tests or measurements: ?Height, weight, and BMI. ?Blood pressure. ?Lipid and cholesterol levels. These may be checked every 5 years, or more frequently if you are over 3 years old. ?Skin check. ?Lung cancer screening. You may have this screening every year starting at age 76 if you have a 30-pack-year history of smoking and currently smoke or have quit within the past 15 years. ?Fecal occult blood test (FOBT) of the stool. You may have this test every year starting at age 76. ?Flexible sigmoidoscopy or colonoscopy. You may have a sigmoidoscopy every 5 years or a colonoscopy every 10 years starting at age 75. ?Hepatitis C blood test. ?Hepatitis B blood test. ?Sexually transmitted disease (STD) testing. ?Diabetes screening. This is done by checking your blood sugar (glucose) after you have not eaten for a while (fasting). You may have this done every 1-3 years. ?Bone density scan. This is done to screen for osteoporosis. You may have this done starting at age 20. ?Mammogram. This may be done every 1-2 years. Talk to your health care provider about how often you should have regular mammograms. ?Talk with your health care provider about your test results, treatment options, and if necessary, the need for more tests. ?Vaccines  ?Your health care provider may recommend certain vaccines, such as: ?Influenza vaccine. This is recommended every year. ?Tetanus, diphtheria, and acellular pertussis (Tdap, Td) vaccine. You may need a Td booster every 10 years. ?Zoster vaccine. You may need this after age 66. ?Pneumococcal 13-valent conjugate (PCV13) vaccine. One dose is recommended after age 14. ?Pneumococcal polysaccharide (PPSV23) vaccine. One dose is recommended after age 54. ?Talk to your health care provider about which screenings and vaccines  you need and how often you need them. ?This  information is not intended to replace advice given to you by your health care provider. Make sure you discuss any questions you have with your health care provider. ?Document Released: 09/13/2015 Document Revised: 05/06/2016 Document Reviewed: 06/18/2015 ?Elsevier Interactive Patient Education ? 2017 [place]. ? ?Fall Prevention in the Home ?Falls can cause injuries. They can happen to people of all ages. There are many things you can do to make your home safe and to help prevent falls. ?What can I do on the outside of my home? ?Regularly fix the edges of walkways and driveways and fix any cracks. ?Remove anything that might make you trip as you walk through a door, such as a raised step or threshold. ?Trim any bushes or trees on the path to your home. ?Use bright outdoor lighting. ?Clear any walking paths of anything that might make someone trip, such as rocks or tools. ?Regularly check to see if handrails are loose or broken. Make sure that both sides of any steps have handrails. ?Any raised decks and porches should have guardrails on the edges. ?Have any leaves, snow, or ice cleared regularly. ?Use sand or salt on walking paths during winter. ?Clean up any spills in your garage right away. This includes oil or grease spills. ?What can I do in the bathroom? ?Use night lights. ?Install grab bars by the toilet and in the tub and shower. Do not use towel bars as grab bars. ?Use non-skid mats or decals in the tub or shower. ?If you need to sit down in the shower, use a plastic, non-slip stool. ?Keep the floor dry. Clean up any water that spills on the floor as soon as it happens. ?Remove soap buildup in the tub or shower regularly. ?Attach bath mats securely with double-sided non-slip rug tape. ?Do not have throw rugs and other things on the floor that can make you trip. ?What can I do in the bedroom? ?Use night lights. ?Make sure that you have a light by your bed that is easy to reach. ?Do not use any sheets or  blankets that are too big for your bed. They should not hang down onto the floor. ?Have a firm chair that has side arms. You can use this for support while you get dressed. ?Do not have throw rugs and other things on the floor that can make you trip. ?What can I do in the kitchen? ?Clean up any spills right away. ?Avoid walking on wet floors. ?Keep items that you use a lot in easy-to-reach places. ?If you need to reach something above you, use a strong step stool that has a grab bar. ?Keep electrical cords out of the way. ?Do not use floor polish or wax that makes floors slippery. If you must use wax, use non-skid floor wax. ?Do not have throw rugs and other things on the floor that can make you trip. ?What can I do with my stairs? ?Do not leave any items on the stairs. ?Make sure that there are handrails on both sides of the stairs and use them. Fix handrails that are broken or loose. Make sure that handrails are as long as the stairways. ?Check any carpeting to make sure that it is firmly attached to the stairs. Fix any carpet that is loose or worn. ?Avoid having throw rugs at the top or bottom of the stairs. If you do have throw rugs, attach them to the floor with carpet tape. ?Make sure  that you have a light switch at the top of the stairs and the bottom of the stairs. If you do not have them, ask someone to add them for you. ?What else can I do to help prevent falls? ?Wear shoes that: ?Do not have high heels. ?Have rubber bottoms. ?Are comfortable and fit you well. ?Are closed at the toe. Do not wear sandals. ?If you use a stepladder: ?Make sure that it is fully opened. Do not climb a closed stepladder. ?Make sure that both sides of the stepladder are locked into place. ?Ask someone to hold it for you, if possible. ?Clearly mark and make sure that you can see: ?Any grab bars or handrails. ?First and last steps. ?Where the edge of each step is. ?Use tools that help you move around (mobility aids) if they are  needed. These include: ?Canes. ?Walkers. ?Scooters. ?Crutches. ?Turn on the lights when you go into a dark area. Replace any light bulbs as soon as they burn out. ?Set up your furniture so you have a clear path. A

## 2021-12-22 NOTE — Progress Notes (Signed)
? ?Subjective:  ? Cindy Larson is a 76 y.o. female who presents for Medicare Annual (Subsequent) preventive examination. ? ? ?I connected with [name] today by telephone and verified that I am speaking with the correct person using two identifiers. ?Location patient: home ?Location provider: work ?Persons participating in the virtual visit: patient, provider. ?  ?I discussed the limitations, risks, security and privacy concerns of performing an evaluation and management service by telephone and the availability of in person appointments. I also discussed with the patient that there may be a patient responsible charge related to this service. The patient expressed understanding and verbally consented to this telephonic visit.  ?  ?Interactive audio and video telecommunications were attempted between this provider and patient, however failed, due to patient having technical difficulties OR patient did not have access to video capability.  We continued and completed visit with audio only. ? ?  ?Review of Systems    ? ?Cardiac Risk Factors include: advanced age (>19mn, >>69women);diabetes mellitus;dyslipidemia;hypertension ? ?   ?Objective:  ?  ?Today's Vitals  ? ?There is no height or weight on file to calculate BMI. ? ? ?  12/22/2021  ? 10:55 AM 12/14/2020  ?  1:00 AM 01/24/2020  ?  2:16 PM 02/03/2017  ?  2:32 PM 01/16/2015  ?  2:43 PM  ?Advanced Directives  ?Does Patient Have a Medical Advance Directive? No No No No Yes  ?Copy of [facility] Chart?     No - copy requested  ?Would patient like information on creating a medical advance directive? No - Patient declined No - Patient declined No - Patient declined    ? ? ?Current Medications (verified) ?Outpatient Encounter Medications as of 12/22/2021  ?Medication Sig  ? acetaminophen (TYLENOL) 500 MG tablet Take 500-1,000 mg by mouth every 8 (eight) hours as needed (for pain).  ? ALPRAZolam (XANAX) 0.25 MG tablet Take 1 tablet by mouth  twice daily as needed for anxiety  ? atorvastatin (LIPITOR) 20 MG tablet Take 1 tablet (20 mg total) by mouth daily.  ? Blood Glucose Monitoring Suppl (ACCU-CHEK GUIDE ME) w/Device KIT Use as directed four times per day E11.9  ? cetirizine (ZYRTEC) 5 MG tablet Take 1 tablet (5 mg total) by mouth daily.  ? Cholecalciferol (VITAMIN D3) 50 MCG (2000 UT) TABS Take 2,000 Units by mouth daily.  ? citalopram (CELEXA) 10 MG tablet Take 1 tablet by mouth once daily  ? clopidogrel (PLAVIX) 75 MG tablet Take 1 tablet (75 mg total) by mouth daily.  ? fluticasone (FLONASE) 50 MCG/ACT nasal spray Place 2 sprays into both nostrils daily.  ? gabapentin (NEURONTIN) 300 MG capsule Take 1 capsule (300 mg total) by mouth 3 (three) times daily.  ? glimepiride (AMARYL) 2 MG tablet TAKE 1 TABLET BY MOUTH ONCE DAILY BEFORE BREAKFAST  ? glucose blood (ACCU-CHEK GUIDE) test strip Use as instructed four times per day  E11.9  ? Lancets MISC Use as directed four time per day E11.9  ? lisinopril (ZESTRIL) 5 MG tablet TAKE 1 TABLET BY MOUTH ONCE DAILY  ? meclizine (ANTIVERT) 25 MG tablet TAKE 1 TABLET BY MOUTH EVERY 6 HOURS AS NEEDED FOR DIZZINESS  ? omeprazole (PRILOSEC) 20 MG capsule TAKE 1 CAPSULE BY MOUTH TWICE DAILY BEFORE MEAL(S)  ? PARoxetine (PAXIL) 20 MG tablet Take 1 tablet (20 mg total) by mouth daily.  ? Polyethyl Glycol-Propyl Glycol (SYSTANE OP) Place 1 drop into both eyes in the morning and  at bedtime.  ? promethazine (PHENERGAN) 25 MG tablet TAKE ONE TABLET BY MOUTH EVERY 6 HOURS AS NEEDED FOR NAUSEA (Patient taking differently: Take 25 mg by mouth every 6 (six) hours as needed for nausea.)  ? traMADol (ULTRAM) 50 MG tablet TAKE 1 TABLET BY MOUTH EVERY 12 HOURS AS NEEDED  ? triamcinolone (NASACORT) 55 MCG/ACT AERO nasal inhaler Place 2 sprays into the nose daily. (Patient taking differently: Place 2 sprays into the nose daily as needed (for allergies or sinus issues).)  ? ?No facility-administered encounter medications on file as  of 12/22/2021.  ? ? ?Allergies (verified) ?Aspirin, Fexofenadine, Nsaids, Penicillins, Prevnar [pneumococcal 13-val conj vacc], and Zocor [simvastatin]  ? ?History: ?Past Medical History:  ?Diagnosis Date  ? ALLERGIC RHINITIS   ? ANXIETY   ? COLONIC POLYPS, HX OF   ? DEPRESSION   ? DIABETES MELLITUS, TYPE II   ? Diverticulitis   ? DIVERTICULOSIS, COLON   ? Esophageal stricture   ? Gallstones   ? GERD   ? HIATAL HERNIA   ? History of blood transfusion   ? left knee replacement  ? HYPERLIPIDEMIA   ? Hypersomnolence   ? HYPERTENSION   ? INTERMITTENT VERTIGO   ? OBESITY   ? OSTEOARTHRITIS   ? L knee  ? OSTEOPENIA   ? Pneumonia   ? Post-menopausal   ? hormone replacement therapy  ? ?Past Surgical History:  ?Procedure Laterality Date  ? ABDOMINAL HYSTERECTOMY    ? CESAREAN SECTION    ? CHOLECYSTECTOMY    ? COLONOSCOPY  03/02/2017  ? [name]  ? Lysis of Adhesions    ? OOPHORECTOMY    ? one  ? POLYPECTOMY    ? ROTATOR CUFF REPAIR Left   ? TOTAL KNEE ARTHROPLASTY Left   ? ?Family History  ?Problem Relation Age of Onset  ? Hypertension Mother   ? Atrial fibrillation Mother   ? Heart disease Mother   ?     CHF  ? Cancer Mother   ?     Cancer in "thigh"  ? Ovarian cancer Sister   ? Lung cancer Brother   ?     smoker  ? Heart failure Brother   ?     CHF  ? Breast cancer Maternal Grandmother   ? Kidney disease Maternal Aunt   ? Colon cancer Paternal Aunt   ?     originated as breast CA  ? Breast cancer Paternal Aunt   ? Diabetes Other   ?     paternal Aunt and uncle  ? Esophageal cancer Neg Hx   ? Rectal cancer Neg Hx   ? Stomach cancer Neg Hx   ? Colon polyps Neg Hx   ? ?Social History  ? ?Socioeconomic History  ? Marital status: Married  ?  Spouse name: Not on file  ? Number of children: 3  ? Years of education: Not on file  ? Highest education level: Not on file  ?Occupational History  ? Occupation: Control and instrumentation engineer  ?Tobacco Use  ? Smoking status: Never  ? Smokeless tobacco: Never  ?Vaping Use  ? Vaping Use: Never used  ?Substance and  Sexual Activity  ? Alcohol use: No  ? Drug use: No  ? Sexual activity: Not on file  ?Other Topics Concern  ? Not on file  ?Social History Narrative  ? Not on file  ? ?Social Determinants of Health  ? ?Financial Resource Strain: Low Risk   ? Difficulty of Paying Living  Expenses: Not hard at all  ?Food Insecurity: No Food Insecurity  ? Worried About Running Out of Food in the Last Year: Never true  ? Ran Out of Food in the Last Year: Never true  ?Transportation Needs: No Transportation Needs  ? Lack of Transportation (Medical): No  ? Lack of Transportation (Non-Medical): No  ?Physical Activity: Insufficiently Active  ? Days of Exercise per Week: 3 days  ? Minutes of Exercise per Session: 20 min  ?Stress: No Stress Concern Present  ? Feeling of Stress : Not at all  ?Social Connections: Moderately Integrated  ? Frequency of Communication with Friends and Family: Three times a week  ? Frequency of Social Gatherings with Friends and Family: Three times a week  ? Attends Religious Services: More than 4 times per year  ? Active Member of Clubs or Organizations: No  ? Attends Club or Organization Meetings: Never  ? Marital Status: Married  ? ? ?Tobacco Counseling ?Counseling given: Not Answered ? ? ?Clinical Intake: ? ?Pre-visit preparation completed: Yes ? ?Pain : No/denies pain ? ?  ? ?Nutritional Risks: None ?Diabetes: Yes ?CBG done?: No ?Did pt. bring in CBG monitor from home?: No ? ?How often do you need to have someone help you when you read instructions, pamphlets, or other written materials from your doctor or pharmacy?: 1 - Never ?What is the last grade level you completed in school?: High school ? ?Diabetic?yes ?Nutrition Risk Assessment: ? ?Has the patient had any N/V/D within the last 2 months?  No  ?Does the patient have any non-healing wounds?  No  ?Has the patient had any unintentional weight loss or weight gain?  No  ? ?Diabetes: ? ?Is the patient diabetic?  Yes  ?If diabetic, was a CBG obtained today?  No   ?Did the patient bring in their glucometer from home?  No  ?How often do you monitor your CBG's? 4 x day .  ? ?Financial Strains and Diabetes Management: ? ?Are you having any financial strains with

## 2021-12-26 DIAGNOSIS — H402234 Chronic angle-closure glaucoma, bilateral, indeterminate stage: Secondary | ICD-10-CM | POA: Diagnosis not present

## 2022-01-09 ENCOUNTER — Other Ambulatory Visit: Payer: Self-pay | Admitting: Nephrology

## 2022-01-09 ENCOUNTER — Telehealth: Payer: Self-pay | Admitting: Internal Medicine

## 2022-01-09 DIAGNOSIS — Z8673 Personal history of transient ischemic attack (TIA), and cerebral infarction without residual deficits: Secondary | ICD-10-CM | POA: Diagnosis not present

## 2022-01-09 DIAGNOSIS — E785 Hyperlipidemia, unspecified: Secondary | ICD-10-CM

## 2022-01-09 DIAGNOSIS — N184 Chronic kidney disease, stage 4 (severe): Secondary | ICD-10-CM | POA: Diagnosis not present

## 2022-01-09 DIAGNOSIS — I129 Hypertensive chronic kidney disease with stage 1 through stage 4 chronic kidney disease, or unspecified chronic kidney disease: Secondary | ICD-10-CM | POA: Diagnosis not present

## 2022-01-09 DIAGNOSIS — E1122 Type 2 diabetes mellitus with diabetic chronic kidney disease: Secondary | ICD-10-CM

## 2022-01-09 MED ORDER — GABAPENTIN 300 MG PO CAPS: 300.0000 mg | ORAL_CAPSULE | Freq: Three times a day (TID) | ORAL | 1 refills | Status: AC

## 2022-01-09 NOTE — Telephone Encounter (Signed)
1.Medication Requested: ?gabapentin (NEURONTIN) 300 MG capsule ?2. Pharmacy (Name, Street, [place]): ?[place] ([place]), [place] - [address] Phone:  [phone]  ?Fax:  [phone]  ?  ? ?3. On Med List: yes ? ?4. Last Visit with PCP: ? ?5. Next visit date with PCP: ? ? ? ?Agent: Please be advised that RX refills may take up to 3 business days. We ask that you follow-up with your pharmacy.  ?

## 2022-01-09 NOTE — Telephone Encounter (Signed)
Ok done erx 

## 2022-01-13 ENCOUNTER — Ambulatory Visit
Admission: RE | Admit: 2022-01-13 | Discharge: 2022-01-13 | Disposition: A | Discharge status: Home / Self Care | Payer: Medicare Other | Source: Ambulatory Visit | Attending: Nephrology | Admitting: Nephrology

## 2022-01-13 DIAGNOSIS — N184 Chronic kidney disease, stage 4 (severe): Secondary | ICD-10-CM

## 2022-01-13 DIAGNOSIS — N271 Small kidney, bilateral: Secondary | ICD-10-CM | POA: Diagnosis not present

## 2022-01-13 DIAGNOSIS — E1122 Type 2 diabetes mellitus with diabetic chronic kidney disease: Secondary | ICD-10-CM

## 2022-01-13 DIAGNOSIS — N189 Chronic kidney disease, unspecified: Secondary | ICD-10-CM | POA: Diagnosis not present

## 2022-01-13 DIAGNOSIS — E785 Hyperlipidemia, unspecified: Secondary | ICD-10-CM

## 2022-01-13 DIAGNOSIS — Z8673 Personal history of transient ischemic attack (TIA), and cerebral infarction without residual deficits: Secondary | ICD-10-CM

## 2022-01-22 DIAGNOSIS — H402234 Chronic angle-closure glaucoma, bilateral, indeterminate stage: Secondary | ICD-10-CM | POA: Diagnosis not present

## 2022-01-22 DIAGNOSIS — H25813 Combined forms of age-related cataract, bilateral: Secondary | ICD-10-CM | POA: Diagnosis not present

## 2022-02-12 ENCOUNTER — Ambulatory Visit (INDEPENDENT_AMBULATORY_CARE_PROVIDER_SITE_OTHER): Payer: Medicare Other | Admitting: Internal Medicine

## 2022-02-12 ENCOUNTER — Encounter: Payer: Self-pay | Admitting: Internal Medicine

## 2022-02-12 VITALS — BP 122/70 | HR 70 | Temp 97.9°F | Ht <= 58 in | Wt 169.0 lb

## 2022-02-12 DIAGNOSIS — N1831 Chronic kidney disease, stage 3a: Secondary | ICD-10-CM

## 2022-02-12 DIAGNOSIS — E785 Hyperlipidemia, unspecified: Secondary | ICD-10-CM | POA: Diagnosis not present

## 2022-02-12 DIAGNOSIS — E1169 Type 2 diabetes mellitus with other specified complication: Secondary | ICD-10-CM | POA: Diagnosis not present

## 2022-02-12 DIAGNOSIS — E782 Mixed hyperlipidemia: Secondary | ICD-10-CM

## 2022-02-12 DIAGNOSIS — I10 Essential (primary) hypertension: Secondary | ICD-10-CM | POA: Diagnosis not present

## 2022-02-12 DIAGNOSIS — L309 Dermatitis, unspecified: Secondary | ICD-10-CM | POA: Diagnosis not present

## 2022-02-12 MED ORDER — TRIAMCINOLONE ACETONIDE 0.1 % EX CREA: 1.0000 | TOPICAL_CREAM | Freq: Two times a day (BID) | CUTANEOUS | 1 refills | Status: DC

## 2022-02-12 NOTE — Patient Instructions (Signed)
Please take all new medication as prescribed - the cream for the palm rash  Please continue all other medications as before, and refills have been done if requested.  Please have the pharmacy call with any other refills you may need.  Please keep your appointments with your specialists as you may have planned- kidney doctor  Please make an Appointment to return in 6 months, or sooner if needed

## 2022-02-12 NOTE — Progress Notes (Unsigned)
Patient ID: Cindy Larson, female   DOB: 1945-12-24, 76 y.o.   MRN: [id number]        Chief Complaint: follow up eczema right palm, ckd, dm, hld       HPI:  Cindy Larson is a 76 y.o. female here with c/o worsening 1 mo eczema with itching to the right palm, just cant stop scratching.  Pt denies chest pain, increased sob or doe, wheezing, orthopnea, PND, increased LE swelling, palpitations, dizziness or syncope.   Pt denies polydipsia, polyuria, or new focal neuro s/s.    Pt denies fever, wt loss, night sweats, loss of appetite, or other constitutional symptoms  Sees renal every 6 months.  Wilder Glade added recently and tolerating well  Wt Readings from Last 3 Encounters:  02/12/22 169 lb (76.7 kg)  11/12/21 167 lb 9.6 oz (76 kg)  09/11/21 163 lb (73.9 kg)   BP Readings from Last 3 Encounters:  02/12/22 122/70  11/12/21 128/78  09/11/21 102/68         Past Medical History:  Diagnosis Date   ALLERGIC RHINITIS    ANXIETY    COLONIC POLYPS, HX OF    DEPRESSION    DIABETES MELLITUS, TYPE II    Diverticulitis    DIVERTICULOSIS, COLON    Esophageal stricture    Gallstones    GERD    HIATAL HERNIA    History of blood transfusion    left knee replacement   HYPERLIPIDEMIA    Hypersomnolence    HYPERTENSION    INTERMITTENT VERTIGO    OBESITY    OSTEOARTHRITIS    L knee   OSTEOPENIA    Pneumonia    Post-menopausal    hormone replacement therapy   Past Surgical History:  Procedure Laterality Date   ABDOMINAL HYSTERECTOMY     CESAREAN SECTION     CHOLECYSTECTOMY     COLONOSCOPY  03/02/2017   [name]   Lysis of Adhesions     OOPHORECTOMY     one   POLYPECTOMY     ROTATOR CUFF REPAIR Left    TOTAL KNEE ARTHROPLASTY Left     reports that she has never smoked. She has never used smokeless tobacco. She reports that she does not drink alcohol and does not use drugs. family history includes Atrial fibrillation in her mother; Breast cancer in her maternal  grandmother and paternal aunt; Cancer in her mother; Colon cancer in her paternal aunt; Diabetes in an other family member; Heart disease in her mother; Heart failure in her brother; Hypertension in her mother; Kidney disease in her maternal aunt; Lung cancer in her brother; Ovarian cancer in her sister. Allergies  Allergen Reactions   Aspirin Nausea Only and Other (See Comments)    Made the stomach "hurt"   Fexofenadine Other (See Comments)    "This makes me feel badly. I didn't feel well when I took it"   Nsaids Nausea Only and Other (See Comments)    The patient CAN tolerate Tramadol and Tylenol- no Aleve or Motrin, though   Penicillins Hives and Other (See Comments)    Blisters, also   Prevnar [Pneumococcal 13-Val Conj Vacc] Swelling and Other (See Comments)    Left arm became swollen and welts appeared   Zocor [Simvastatin] Itching   Current Outpatient Medications on File Prior to Visit  Medication Sig Dispense Refill   acetaminophen (TYLENOL) 500 MG tablet Take 500-1,000 mg by mouth every 8 (eight) hours as needed (for pain).  ALPRAZolam (XANAX) 0.25 MG tablet Take 1 tablet by mouth twice daily as needed for anxiety 60 tablet 5   atorvastatin (LIPITOR) 20 MG tablet Take 1 tablet (20 mg total) by mouth daily. 90 tablet 3   Blood Glucose Monitoring Suppl (ACCU-CHEK GUIDE ME) w/Device KIT Use as directed four times per day E11.9 1 kit 0   cetirizine (ZYRTEC) 5 MG tablet Take 1 tablet (5 mg total) by mouth daily. 30 tablet 11   Cholecalciferol (VITAMIN D3) 50 MCG (2000 UT) TABS Take 2,000 Units by mouth daily.     citalopram (CELEXA) 10 MG tablet Take 1 tablet by mouth once daily 90 tablet 2   clopidogrel (PLAVIX) 75 MG tablet Take 1 tablet (75 mg total) by mouth daily. 90 tablet 1   dapagliflozin propanediol (FARXIGA) 5 MG TABS tablet Take 5 mg by mouth daily.     fluticasone (FLONASE) 50 MCG/ACT nasal spray Place 2 sprays into both nostrils daily. 16 g 0   gabapentin (NEURONTIN) 300  MG capsule Take 1 capsule (300 mg total) by mouth 3 (three) times daily. 270 capsule 1   glucose blood (ACCU-CHEK GUIDE) test strip Use as instructed four times per day  E11.9 400 each 12   Lancets MISC Use as directed four time per day E11.9 400 each 11   lisinopril (ZESTRIL) 5 MG tablet TAKE 1 TABLET BY MOUTH ONCE DAILY 90 tablet 3   meclizine (ANTIVERT) 25 MG tablet TAKE 1 TABLET BY MOUTH EVERY 6 HOURS AS NEEDED FOR DIZZINESS 30 tablet 0   omeprazole (PRILOSEC) 20 MG capsule TAKE 1 CAPSULE BY MOUTH TWICE DAILY BEFORE MEAL(S) 180 capsule 3   PARoxetine (PAXIL) 20 MG tablet Take 1 tablet (20 mg total) by mouth daily. 90 tablet 3   Polyvinyl Alcohol-Povidone (REFRESH OP) Apply to eye.     promethazine (PHENERGAN) 25 MG tablet TAKE ONE TABLET BY MOUTH EVERY 6 HOURS AS NEEDED FOR NAUSEA (Patient taking differently: Take 25 mg by mouth every 6 (six) hours as needed for nausea.) 40 tablet 0   triamcinolone (NASACORT) 55 MCG/ACT AERO nasal inhaler Place 2 sprays into the nose daily. (Patient taking differently: Place 2 sprays into the nose daily as needed (for allergies or sinus issues).) 1 Inhaler 12   traMADol (ULTRAM) 50 MG tablet TAKE 1 TABLET BY MOUTH EVERY 12 HOURS AS NEEDED (Patient not taking: Reported on 02/12/2022) 30 tablet 2   No current facility-administered medications on file prior to visit.        ROS:  All others reviewed and negative.  Objective        PE:  BP 122/70 (BP Location: Left Arm, Patient Position: Sitting, Cuff Size: Large)   Pulse 70   Temp 97.9 F (36.6 C) (Oral)   Ht _0  (1.473 m)   Wt 169 lb (76.7 kg)   SpO2 98%   BMI 35.32 kg/m                 Constitutional: Pt appears in NAD               HENT: Head: NCAT.                Right Ear: External ear normal.                 Left Ear: External ear normal.                Eyes: . Pupils are equal, round, and reactive to  light. Conjunctivae and EOM are normal               Nose: without d/c or deformity                Neck: Neck supple. Gross normal ROM               Cardiovascular: Normal rate and regular rhythm.                 Pulmonary/Chest: Effort normal and breath sounds without rales or wheezing.                Abd:  Soft, NT, ND, + BS, no organomegaly               Neurological: Pt is alert. At baseline orientation, motor grossly intact               Skin: Skin is warm. No rashes, no other new lesions, LE edema - none               Psychiatric: Pt behavior is normal without agitation   Micro: none  Cardiac tracings I have personally interpreted today:  none  Pertinent Radiological findings (summarize): none   Lab Results  Component Value Date   WBC 5.7 11/12/2021   HGB 10.3 (L) 11/12/2021   HCT 32.4 (L) 11/12/2021   PLT 238.0 11/12/2021   GLUCOSE 61 (L) 11/12/2021   CHOL 149 11/12/2021   TRIG 154.0 (H) 11/12/2021   HDL 54.70 11/12/2021   LDLDIRECT 83.0 04/21/2021   LDLCALC 64 11/12/2021   ALT 11 11/12/2021   AST 15 11/12/2021   NA 136 11/12/2021   K 4.8 11/12/2021   CL 105 11/12/2021   CREATININE 1.95 (H) 11/12/2021   BUN 27 (H) 11/12/2021   CO2 25 11/12/2021   TSH 4.17 11/12/2021   INR 0.9 12/14/2020   HGBA1C 6.7 (H) 11/12/2021   MICROALBUR 2.1 (H) 11/12/2021   Assessment/Plan:  Cindy Larson is a 76 y.o. Black or African American [2] female with  has a past medical history of ALLERGIC RHINITIS, ANXIETY, COLONIC POLYPS, HX OF, DEPRESSION, DIABETES MELLITUS, TYPE II, Diverticulitis, DIVERTICULOSIS, COLON, Esophageal stricture, Gallstones, GERD, HIATAL HERNIA, History of blood transfusion, HYPERLIPIDEMIA, Hypersomnolence, HYPERTENSION, INTERMITTENT VERTIGO, OBESITY, OSTEOARTHRITIS, OSTEOPENIA, Pneumonia, and Post-menopausal.  Type 2 diabetes mellitus with hyperlipidemia (HCC) Lab Results  Component Value Date   HGBA1C 6.7 (H) 11/12/2021   Stable, pt to continue current medical treatment farxiga 5 mg   Hyperlipidemia Lab Results  Component Value Date    LDLCALC 64 11/12/2021   Stable, pt to continue current statin lipitor 20 mg qd   Essential hypertension BP Readings from Last 3 Encounters:  02/12/22 122/70  11/12/21 128/78  09/11/21 102/68   Stable, pt to continue medical treatment lisinopril 5 mg qd   CKD (chronic kidney disease) Lab Results  Component Value Date   CREATININE 1.95 (H) 11/12/2021   Stable overall, cont to avoid nephrotoxins, f/u renal as planned  Eczema Mild worsening, for triam cr prn,  to f/u any worsening symptoms or concerns  Followup: Return in about 6 months (around 08/14/2022).  Cathlean Cower, MD 02/14/2022 9:19 PM [facility]

## 2022-02-14 ENCOUNTER — Encounter: Payer: Self-pay | Admitting: Internal Medicine

## 2022-02-14 DIAGNOSIS — L309 Dermatitis, unspecified: Secondary | ICD-10-CM | POA: Insufficient documentation

## 2022-02-14 NOTE — Assessment & Plan Note (Signed)
Mild worsening, for triam cr prn,  to f/u any worsening symptoms or concerns

## 2022-02-14 NOTE — Assessment & Plan Note (Signed)
Lab Results  Component Value Date   LDLCALC 64 11/12/2021   Stable, pt to continue current statin lipitor 20 mg qd  

## 2022-02-14 NOTE — Assessment & Plan Note (Signed)
BP Readings from Last 3 Encounters:  02/12/22 122/70  11/12/21 128/78  09/11/21 102/68   Stable, pt to continue medical treatment lisinopril 5 mg qd

## 2022-02-14 NOTE — Assessment & Plan Note (Signed)
Lab Results  Component Value Date   CREATININE 1.95 (H) 11/12/2021   Stable overall, cont to avoid nephrotoxins, f/u renal as planned 

## 2022-02-14 NOTE — Assessment & Plan Note (Signed)
Lab Results  Component Value Date   HGBA1C 6.7 (H) 11/12/2021   Stable, pt to continue current medical treatment farxiga 5 mg

## 2022-02-16 ENCOUNTER — Other Ambulatory Visit: Payer: Self-pay | Admitting: Internal Medicine

## 2022-02-16 NOTE — Telephone Encounter (Signed)
Please refill as per office routine med refill policy (all routine meds to be refilled for 3 mo or monthly (per pt preference) up to one year from last visit, then month to month grace period for 3 mo, then further med refills will have to be denied) ? ?

## 2022-04-08 DIAGNOSIS — E162 Hypoglycemia, unspecified: Secondary | ICD-10-CM | POA: Diagnosis not present

## 2022-04-08 DIAGNOSIS — Z743 Need for continuous supervision: Secondary | ICD-10-CM | POA: Diagnosis not present

## 2022-04-08 DIAGNOSIS — R410 Disorientation, unspecified: Secondary | ICD-10-CM | POA: Diagnosis not present

## 2022-04-08 DIAGNOSIS — E161 Other hypoglycemia: Secondary | ICD-10-CM | POA: Diagnosis not present

## 2022-04-08 DIAGNOSIS — R404 Transient alteration of awareness: Secondary | ICD-10-CM | POA: Diagnosis not present

## 2022-04-12 DIAGNOSIS — Z743 Need for continuous supervision: Secondary | ICD-10-CM | POA: Diagnosis not present

## 2022-04-12 DIAGNOSIS — G4489 Other headache syndrome: Secondary | ICD-10-CM | POA: Diagnosis not present

## 2022-04-12 DIAGNOSIS — E162 Hypoglycemia, unspecified: Secondary | ICD-10-CM | POA: Diagnosis not present

## 2022-04-12 DIAGNOSIS — E161 Other hypoglycemia: Secondary | ICD-10-CM | POA: Diagnosis not present

## 2022-04-12 DIAGNOSIS — R404 Transient alteration of awareness: Secondary | ICD-10-CM | POA: Diagnosis not present

## 2022-04-17 ENCOUNTER — Telehealth (HOSPITAL_COMMUNITY): Payer: Self-pay

## 2022-04-17 ENCOUNTER — Encounter (HOSPITAL_COMMUNITY): Payer: Self-pay

## 2022-04-17 ENCOUNTER — Ambulatory Visit (HOSPITAL_COMMUNITY)
Admission: EM | Admit: 2022-04-17 | Discharge: 2022-04-17 | Disposition: A | Discharge status: Home / Self Care | Payer: Medicare Other | Attending: Family Medicine | Admitting: Family Medicine

## 2022-04-17 DIAGNOSIS — R609 Edema, unspecified: Secondary | ICD-10-CM

## 2022-04-17 DIAGNOSIS — R21 Rash and other nonspecific skin eruption: Secondary | ICD-10-CM

## 2022-04-17 MED ORDER — PREDNISONE 20 MG PO TABS: 40.0000 mg | ORAL_TABLET | Freq: Every day | ORAL | 0 refills | Status: AC

## 2022-04-17 MED ORDER — MUPIROCIN 2 % EX OINT: 1.0000 | TOPICAL_OINTMENT | Freq: Two times a day (BID) | CUTANEOUS | 0 refills | Status: AC

## 2022-04-17 MED ORDER — MUPIROCIN 2 % EX OINT: 1.0000 | TOPICAL_OINTMENT | Freq: Two times a day (BID) | CUTANEOUS | 0 refills | Status: DC

## 2022-04-17 MED ORDER — PREDNISONE 20 MG PO TABS: 40.0000 mg | ORAL_TABLET | Freq: Every day | ORAL | 0 refills | Status: DC

## 2022-04-17 NOTE — ED Provider Notes (Signed)
[facility]    CSN: [id number] Arrival date & time: 04/17/22  1809      History   Chief Complaint Chief Complaint  Patient presents with   Rash   Leg Swelling   Foreign Body    Left foot    HPI Cindy Larson is a 76 y.o. female.    Rash Foreign Body  Here for rash and itching that began yesterday.  It is mainly on her lower legs and is a spotty red rash.  Her feet are actually swelling a little bit too.  Today she notes little bit of red rash on her left forearm.  No fever and no cough and no trouble breathing. Does have diabetes that is well controlled.  She thought maybe fark [employer] was causing trouble but she had stopped that on August 13, and this rash did not start till yesterday August 17.  She also feels like there is a splinter in her left foot at the lateral border near her pinky toe.  She has been digging in it and has not been able to get anything out.   Past Medical History:  Diagnosis Date   ALLERGIC RHINITIS    ANXIETY    COLONIC POLYPS, HX OF    DEPRESSION    DIABETES MELLITUS, TYPE II    Diverticulitis    DIVERTICULOSIS, COLON    Esophageal stricture    Gallstones    GERD    HIATAL HERNIA    History of blood transfusion    left knee replacement   HYPERLIPIDEMIA    Hypersomnolence    HYPERTENSION    INTERMITTENT VERTIGO    OBESITY    OSTEOARTHRITIS    L knee   OSTEOPENIA    Pneumonia    Post-menopausal    hormone replacement therapy    Patient Active Problem List   Diagnosis Date Noted   Eczema 02/14/2022   RLQ abdominal pain 07/04/2021   Pruritic condition 06/16/2021   Left ankle sprain 12/19/2020   Hypoglycemia 12/14/2020   Left leg swelling 12/14/2020   TIA (transient ischemic attack)    Right knee pain 02/17/2020   Acute sinus infection 01/03/2020   Left-sided face pain 02/24/2019   CKD (chronic kidney disease) 06/01/2018   Diverticulitis of colon without hemorrhage 04/29/2018   Bilateral shoulder pain  01/31/2016   Chest pain 04/18/2015   Left arm pain 07/25/2013   Neck pain on left side 06/15/2013   LLQ abdominal pain 10/11/2012   Back pain 03/03/2012   Right upper lobe pneumonia 02/16/2012   Encounter for well adult exam with abnormal findings 05/21/2011   ABDOMINAL PAIN OTHER SPECIFIED SITE 10/24/2010   INTERMITTENT VERTIGO 08/07/2009   Dehydration 01/11/2009   OSTEOPENIA 04/09/2008   PARESTHESIA 03/08/2008   Type 2 diabetes mellitus with hyperlipidemia (HCC) 07/03/2007   Hyperlipidemia 07/03/2007   OBESITY 07/03/2007   Anxiety state 07/03/2007   Depression 07/03/2007   Essential hypertension 07/03/2007   Allergic rhinitis 07/03/2007   GERD 07/03/2007   HIATAL HERNIA 07/03/2007   DIVERTICULOSIS, COLON 07/03/2007   OSTEOARTHRITIS 07/03/2007   History of colon polyps 07/03/2007    Past Surgical History:  Procedure Laterality Date   ABDOMINAL HYSTERECTOMY     CESAREAN SECTION     CHOLECYSTECTOMY     COLONOSCOPY  03/02/2017   Marina Goodell   Lysis of Adhesions     OOPHORECTOMY     one   POLYPECTOMY     ROTATOR CUFF REPAIR Left  TOTAL KNEE ARTHROPLASTY Left     OB History   No obstetric history on file.      Home Medications    Prior to Admission medications   Medication Sig Start Date End Date Taking? Authorizing Provider  ALPRAZolam Duanne Moron) 0.25 MG tablet Take 1 tablet by mouth twice daily as needed for anxiety 01/24/21  Yes Biagio Borg, MD  atorvastatin (LIPITOR) 20 MG tablet Take 1 tablet (20 mg total) by mouth daily. 04/21/21  Yes Biagio Borg, MD  Cholecalciferol (VITAMIN D3) 50 MCG (2000 UT) TABS Take 2,000 Units by mouth daily.   Yes [provider]  citalopram (CELEXA) 10 MG tablet Take 1 tablet by mouth once daily 08/03/21  Yes Biagio Borg, MD  clopidogrel (PLAVIX) 75 MG tablet Take 1 tablet (75 mg total) by mouth daily. 11/26/21  Yes McCue, Janett Billow, NP  dapagliflozin propanediol (FARXIGA) 5 MG TABS tablet Take 5 mg by mouth daily.   Yes  [provider]  fluticasone (FLONASE) 50 MCG/ACT nasal spray Place 2 sprays into both nostrils daily. 03/03/21  Yes Jaynee Eagles, PA-C  lisinopril (ZESTRIL) 5 MG tablet Take 1 tablet by mouth once daily 02/17/22  Yes Biagio Borg, MD  meclizine (ANTIVERT) 25 MG tablet TAKE 1 TABLET BY MOUTH EVERY 6 HOURS AS NEEDED FOR DIZZINESS 05/02/21  Yes Biagio Borg, MD  mupirocin ointment (BACTROBAN) 2 % Apply 1 Application topically 2 (two) times daily. To affected area till better 04/17/22  Yes Faizon Capozzi, Gwenlyn Perking, MD  omeprazole (PRILOSEC) 20 MG capsule TAKE 1 CAPSULE BY MOUTH TWICE DAILY BEFORE MEAL(S) 10/16/21  Yes Biagio Borg, MD  Polyvinyl Alcohol-Povidone (REFRESH OP) Apply to eye.   Yes [provider]  predniSONE (DELTASONE) 20 MG tablet Take 2 tablets (40 mg total) by mouth daily with breakfast for 5 days. 04/17/22 04/22/22 Yes Esperanza Madrazo, Gwenlyn Perking, MD  acetaminophen (TYLENOL) 500 MG tablet Take 500-1,000 mg by mouth every 8 (eight) hours as needed (for pain).    [provider]  Blood Glucose Monitoring Suppl (ACCU-CHEK GUIDE ME) w/Device KIT Use as directed four times per day E11.9 12/20/20   Biagio Borg, MD  cetirizine (ZYRTEC) 5 MG tablet Take 1 tablet (5 mg total) by mouth daily. 06/16/21   Plotnikov, Evie Lacks, MD  gabapentin (NEURONTIN) 300 MG capsule Take 1 capsule (300 mg total) by mouth 3 (three) times daily. 01/09/22   Biagio Borg, MD  glucose blood (ACCU-CHEK GUIDE) test strip Use as instructed four times per day  E11.9 12/20/20   Biagio Borg, MD  Lancets MISC Use as directed four time per day E11.9 12/19/20   Biagio Borg, MD  PARoxetine (PAXIL) 20 MG tablet Take 1 tablet (20 mg total) by mouth daily. 04/12/19   Biagio Borg, MD  promethazine (PHENERGAN) 25 MG tablet TAKE ONE TABLET BY MOUTH EVERY 6 HOURS AS NEEDED FOR NAUSEA Patient taking differently: Take 25 mg by mouth every 6 (six) hours as needed for nausea. 11/19/16   Biagio Borg, MD  traMADol (ULTRAM) 50  MG tablet TAKE 1 TABLET BY MOUTH EVERY 12 HOURS AS NEEDED Patient not taking: Reported on 02/12/2022 10/26/21   Biagio Borg, MD  triamcinolone (NASACORT) 55 MCG/ACT AERO nasal inhaler Place 2 sprays into the nose daily. Patient taking differently: Place 2 sprays into the nose daily as needed (for allergies or sinus issues). 02/16/20   Biagio Borg, MD  triamcinolone cream (KENALOG) 0.1 %  Apply 1 Application topically 2 (two) times daily. 02/12/22 02/12/23  Biagio Borg, MD    Family History Family History  Problem Relation Age of Onset   Hypertension Mother    Atrial fibrillation Mother    Heart disease Mother        CHF   Cancer Mother        Cancer in "thigh"   Ovarian cancer Sister    Lung cancer Brother        smoker   Heart failure Brother        CHF   Breast cancer Maternal Grandmother    Kidney disease Maternal Aunt    Colon cancer Paternal Aunt        originated as breast CA   Breast cancer Paternal Aunt    Diabetes Other        paternal 3 and uncle   Esophageal cancer Neg Hx    Rectal cancer Neg Hx    Stomach cancer Neg Hx    Colon polyps Neg Hx     Social History Social History   Tobacco Use   Smoking status: Never   Smokeless tobacco: Never  Vaping Use   Vaping Use: Never used  Substance Use Topics   Alcohol use: No   Drug use: No     Allergies   Aspirin, Fexofenadine, Nsaids, Penicillins, Prevnar [pneumococcal 13-val conj vacc], and Zocor [simvastatin]   Review of Systems Review of Systems  Skin:  Positive for rash.     Physical Exam Triage Vital Signs ED Triage Vitals  Enc Vitals Group     BP 04/17/22 1859 102/65     Pulse Rate 04/17/22 1859 69     Resp 04/17/22 1859 16     Temp 04/17/22 1859 98.1 F (36.7 C)     Temp Source 04/17/22 1859 Oral     SpO2 04/17/22 1859 96 %     Weight 04/17/22 1903 168 lb (76.2 kg)     Height 04/17/22 1903 $RemoveBefor'4\' 10"'[id number]$  (1.473 m)     Head Circumference --      Peak Flow --      Pain Score 04/17/22 1903 10      Pain Loc --      Pain Edu? --      Excl. in [place]? --    No data found.  Updated Vital Signs BP 102/65 (BP Location: Left Arm)   Pulse 69   Temp 98.1 F (36.7 C) (Oral)   Resp 16   Ht $R'4\' 10"'Vy$  (1.473 m)   Wt 76.2 kg   SpO2 96%   BMI 35.11 kg/m   Visual Acuity Right Eye Distance:   Left Eye Distance:   Bilateral Distance:    Right Eye Near:   Left Eye Near:    Bilateral Near:     Physical Exam Vitals reviewed.  Constitutional:      General: She is not in acute distress.    Appearance: She is not ill-appearing, toxic-appearing or diaphoretic.  HENT:     Mouth/Throat:     Mouth: Mucous membranes are moist.  Cardiovascular:     Rate and Rhythm: Normal rate and regular rhythm.  Pulmonary:     Effort: Pulmonary effort is normal.     Breath sounds: Normal breath sounds.  Musculoskeletal:     Comments: There is trace edema in both ankles.  Nonpitting.  There are red spots that are slightly raised scattered over her lower legs and 2 on her  left forearm.  No ulceration and no drainage.    Skin:    Comments: I cannot palpate a foreign body where she shows me she has been digging at her skin.  There is a tiny abrasion where she has been working on it.  There is no erythema and no drainage and no induration.  Neurological:     Mental Status: She is alert and oriented to person, place, and time.  Psychiatric:        Behavior: Behavior normal.      UC Treatments / Results  Labs (all labs ordered are listed, but only abnormal results are displayed) Labs Reviewed - No data to display  EKG   Radiology No results found.  Procedures Procedures (including critical care time)  Medications Ordered in UC Medications - No data to display  Initial Impression / Assessment and Plan / UC Course  I have reviewed the triage vital signs and the nursing notes.  Pertinent labs & imaging results that were available during my care of the patient were reviewed by me and  considered in my medical decision making (see chart for details).     I have asked her to stop taking on her foot.  She will put mupirocin on there 2 times daily.  She is given contact information for podiatry.  Prednisone for the possible allergic dermatitis. Final Clinical Impressions(s) / UC Diagnoses   Final diagnoses:  Rash  Peripheral edema     Discharge Instructions      Stop taking on your foot.  Put mupirocin ointment on that area 2 times daily until that is healed.  Take prednisone 20 mg--2 daily for 5 days.  This is for possible allergic reaction.  Be really good on your diet and drink plenty of fluids.  If your sugars are start being too high, then stop the prednisone  You can take Zyrtec/cetirizine daily as needed for itching     ED Prescriptions     Medication Sig Dispense Auth. Provider   mupirocin ointment (BACTROBAN) 2 % Apply 1 Application topically 2 (two) times daily. To affected area till better 22 g Barrett Henle, MD   predniSONE (DELTASONE) 20 MG tablet Take 2 tablets (40 mg total) by mouth daily with breakfast for 5 days. 10 tablet Windy Carina Gwenlyn Perking, MD      PDMP not reviewed this encounter.   Barrett Henle, MD 04/17/22 [phone]

## 2022-04-17 NOTE — Discharge Instructions (Addendum)
Stop taking on your foot.  Put mupirocin ointment on that area 2 times daily until that is healed.  Take prednisone 20 mg--2 daily for 5 days.  This is for possible allergic reaction.  Be really good on your diet and drink plenty of fluids.  If your sugars are start being too high, then stop the prednisone  You can take Zyrtec/cetirizine daily as needed for itching

## 2022-04-17 NOTE — ED Triage Notes (Signed)
Red itching bumps on the lower legs with edema. States she started taking [place] and then stopped on Sunday. Rash broke on her legs last night.   Possible splinter in the left foot. Pain when stepping down on the left foot. States she can feel something sticking out/hard in the foot. Could not get it out on their own. Patient has hard wood floors at home with area rugs.

## 2022-04-20 ENCOUNTER — Other Ambulatory Visit: Payer: Self-pay | Admitting: Internal Medicine

## 2022-04-23 DIAGNOSIS — S90852A Superficial foreign body, left foot, initial encounter: Secondary | ICD-10-CM | POA: Diagnosis not present

## 2022-05-06 ENCOUNTER — Other Ambulatory Visit: Payer: Self-pay | Admitting: Adult Health

## 2022-05-06 DIAGNOSIS — L853 Xerosis cutis: Secondary | ICD-10-CM | POA: Diagnosis not present

## 2022-05-06 DIAGNOSIS — L301 Dyshidrosis [pompholyx]: Secondary | ICD-10-CM | POA: Diagnosis not present

## 2022-05-06 DIAGNOSIS — L821 Other seborrheic keratosis: Secondary | ICD-10-CM | POA: Diagnosis not present

## 2022-05-07 DIAGNOSIS — S90852A Superficial foreign body, left foot, initial encounter: Secondary | ICD-10-CM | POA: Diagnosis not present

## 2022-05-25 ENCOUNTER — Other Ambulatory Visit: Payer: Self-pay | Admitting: Internal Medicine

## 2022-05-25 NOTE — Telephone Encounter (Signed)
Please refill as per office routine med refill policy (all routine meds to be refilled for 3 mo or monthly (per pt preference) up to one year from last visit, then month to month grace period for 3 mo, then further med refills will have to be denied) ? ?

## 2022-05-26 ENCOUNTER — Ambulatory Visit (INDEPENDENT_AMBULATORY_CARE_PROVIDER_SITE_OTHER): Payer: Medicare Other | Admitting: Internal Medicine

## 2022-05-26 ENCOUNTER — Encounter: Payer: Self-pay | Admitting: Internal Medicine

## 2022-05-26 VITALS — BP 120/70 | HR 70 | Temp 97.9°F | Ht <= 58 in | Wt 167.0 lb

## 2022-05-26 DIAGNOSIS — I10 Essential (primary) hypertension: Secondary | ICD-10-CM

## 2022-05-26 DIAGNOSIS — E785 Hyperlipidemia, unspecified: Secondary | ICD-10-CM | POA: Diagnosis not present

## 2022-05-26 DIAGNOSIS — E782 Mixed hyperlipidemia: Secondary | ICD-10-CM | POA: Diagnosis not present

## 2022-05-26 DIAGNOSIS — N1831 Chronic kidney disease, stage 3a: Secondary | ICD-10-CM

## 2022-05-26 DIAGNOSIS — E1169 Type 2 diabetes mellitus with other specified complication: Secondary | ICD-10-CM

## 2022-05-26 MED ORDER — DAPAGLIFLOZIN PROPANEDIOL 10 MG PO TABS: 10.0000 mg | ORAL_TABLET | Freq: Every day | ORAL | 3 refills | Status: AC

## 2022-05-26 NOTE — Patient Instructions (Signed)
Ok to increase the [place] to 10 mg per day  Please continue all other medications as before, and refills have been done if requested.  Please have the pharmacy call with any other refills you may need.  Please keep your appointments with your specialists as you may have planned

## 2022-05-26 NOTE — Assessment & Plan Note (Signed)
BP Readings from Last 3 Encounters:  05/26/22 120/70  04/17/22 102/65  02/12/22 122/70   Stable, pt to continue medical treatment lisinopril 5 mg qd

## 2022-05-26 NOTE — Assessment & Plan Note (Signed)
Lab Results  Component Value Date   LDLCALC 64 11/12/2021   Stable, pt to continue current statin lipitor 20 mg qd

## 2022-05-26 NOTE — Assessment & Plan Note (Signed)
Lab Results  Component Value Date   HGBA1C 6.7 (H) 11/12/2021   Uncontrolled,  pt to increase farxiga 10 mg, may need to coordinate with nephrology as they are administering the pt assist program for her

## 2022-05-26 NOTE — Assessment & Plan Note (Signed)
Lab Results  Component Value Date   CREATININE 1.95 (H) 11/12/2021   Stable overall, cont to avoid nephrotoxins, f/u renal as planned 

## 2022-05-26 NOTE — Progress Notes (Signed)
Patient ID: Cindy Larson, female   DOB: [birth year], 76 y.o.   MRN: [id number]        Chief Complaint: follow up HTN, HLD and DM       HPI:  Cindy Larson is a 76 y.o. female here with c/o worsening CBGs recently despite more attntion to diet and several lb wt loss; has been less active of late,  Pt denies chest pain, increased sob or doe, wheezing, orthopnea, PND, increased LE swelling, palpitations, dizziness or syncope.   Pt denies polydipsia, polyuria, or new focal neuro s/s.    Pt denies fever, wt loss, night sweats, loss of appetite, or other constitutional symptoms  Denies urinary symptoms such as dysuria, frequency, urgency, flank pain, hematuria or n/v, fever, chills.  Does not feel ill.  Has been taking farxiga 5 mg per pt assist program per nephrology       Wt Readings from Last 3 Encounters:  05/26/22 167 lb (75.8 kg)  04/17/22 168 lb (76.2 kg)  02/12/22 169 lb (76.7 kg)   BP Readings from Last 3 Encounters:  05/26/22 120/70  04/17/22 102/65  02/12/22 122/70         Past Medical History:  Diagnosis Date   ALLERGIC RHINITIS    ANXIETY    COLONIC POLYPS, HX OF    DEPRESSION    DIABETES MELLITUS, TYPE II    Diverticulitis    DIVERTICULOSIS, COLON    Esophageal stricture    Gallstones    GERD    HIATAL HERNIA    History of blood transfusion    left knee replacement   HYPERLIPIDEMIA    Hypersomnolence    HYPERTENSION    INTERMITTENT VERTIGO    OBESITY    OSTEOARTHRITIS    L knee   OSTEOPENIA    Pneumonia    Post-menopausal    hormone replacement therapy   Past Surgical History:  Procedure Laterality Date   ABDOMINAL HYSTERECTOMY     CESAREAN SECTION     CHOLECYSTECTOMY     COLONOSCOPY  03/02/2017   [name]   Lysis of Adhesions     OOPHORECTOMY     one   POLYPECTOMY     ROTATOR CUFF REPAIR Left    TOTAL KNEE ARTHROPLASTY Left     reports that she has never smoked. She has never used smokeless tobacco. She reports that she does not  drink alcohol and does not use drugs. family history includes Atrial fibrillation in her mother; Breast cancer in her maternal grandmother and paternal aunt; Cancer in her mother; Colon cancer in her paternal aunt; Diabetes in an other family member; Heart disease in her mother; Heart failure in her brother; Hypertension in her mother; Kidney disease in her maternal aunt; Lung cancer in her brother; Ovarian cancer in her sister. Allergies  Allergen Reactions   Aspirin Nausea Only and Other (See Comments)    Made the stomach "hurt"   Fexofenadine Other (See Comments)    "This makes me feel badly. I didn't feel well when I took it"   Nsaids Nausea Only and Other (See Comments)    The patient CAN tolerate Tramadol and Tylenol- no Aleve or Motrin, though   Penicillins Hives and Other (See Comments)    Blisters, also   Prevnar [Pneumococcal 13-Val Conj Vacc] Swelling and Other (See Comments)    Left arm became swollen and welts appeared   Zocor [Simvastatin] Itching   Current Outpatient Medications on File Prior to Visit  Medication Sig Dispense Refill   acetaminophen (TYLENOL) 500 MG tablet Take 500-1,000 mg by mouth every 8 (eight) hours as needed (for pain).     ALPRAZolam (XANAX) 0.25 MG tablet Take 1 tablet by mouth twice daily as needed for anxiety 60 tablet 5   atorvastatin (LIPITOR) 20 MG tablet Take 1 tablet by mouth once daily 90 tablet 0   Blood Glucose Monitoring Suppl (ACCU-CHEK GUIDE ME) w/Device KIT Use as directed four times per day E11.9 1 kit 0   cetirizine (ZYRTEC) 5 MG tablet Take 1 tablet (5 mg total) by mouth daily. 30 tablet 11   Cholecalciferol (VITAMIN D3) 50 MCG (2000 UT) TABS Take 2,000 Units by mouth daily.     citalopram (CELEXA) 10 MG tablet Take 1 tablet by mouth once daily 90 tablet 2   clopidogrel (PLAVIX) 75 MG tablet Take 1 tablet by mouth once daily 90 tablet 2   fluticasone (FLONASE) 50 MCG/ACT nasal spray Place 2 sprays into both nostrils daily. 16 g 0    gabapentin (NEURONTIN) 300 MG capsule Take 1 capsule (300 mg total) by mouth 3 (three) times daily. 270 capsule 1   glucose blood (ACCU-CHEK GUIDE) test strip Use as instructed four times per day  E11.9 400 each 12   Lancets MISC Use as directed four time per day E11.9 400 each 11   lisinopril (ZESTRIL) 5 MG tablet Take 1 tablet by mouth once daily 90 tablet 3   meclizine (ANTIVERT) 25 MG tablet TAKE 1 TABLET BY MOUTH EVERY 6 HOURS AS NEEDED FOR DIZZINESS 30 tablet 0   mupirocin ointment (BACTROBAN) 2 % Apply 1 Application topically 2 (two) times daily. To affected area till better 22 g 0   omeprazole (PRILOSEC) 20 MG capsule TAKE 1 CAPSULE BY MOUTH TWICE DAILY BEFORE MEAL(S) 180 capsule 3   PARoxetine (PAXIL) 20 MG tablet Take 1 tablet (20 mg total) by mouth daily. 90 tablet 3   Polyvinyl Alcohol-Povidone (REFRESH OP) Apply to eye.     promethazine (PHENERGAN) 25 MG tablet TAKE ONE TABLET BY MOUTH EVERY 6 HOURS AS NEEDED FOR NAUSEA (Patient taking differently: Take 25 mg by mouth every 6 (six) hours as needed for nausea.) 40 tablet 0   traMADol (ULTRAM) 50 MG tablet TAKE 1 TABLET BY MOUTH EVERY 12 HOURS AS NEEDED 30 tablet 2   triamcinolone (NASACORT) 55 MCG/ACT AERO nasal inhaler Place 2 sprays into the nose daily. (Patient taking differently: Place 2 sprays into the nose daily as needed (for allergies or sinus issues).) 1 Inhaler 12   triamcinolone cream (KENALOG) 0.1 % Apply 1 Application topically 2 (two) times daily. 30 g 1   No current facility-administered medications on file prior to visit.        ROS:  All others reviewed and negative.  Objective        PE:  BP 120/70 (BP Location: Left Arm, Patient Position: Sitting, Cuff Size: Large)   Pulse 70   Temp 97.9 F (36.6 C) (Oral)   Ht $R'4\' 10"'oJ$  (1.473 m)   Wt 167 lb (75.8 kg)   SpO2 99%   BMI 34.90 kg/m                 Constitutional: Pt appears in NAD               HENT: Head: NCAT.                Right Ear: External ear normal.  Left Ear: External ear normal.                Eyes: . Pupils are equal, round, and reactive to light. Conjunctivae and EOM are normal               Nose: without d/c or deformity               Neck: Neck supple. Gross normal ROM               Cardiovascular: Normal rate and regular rhythm.                 Pulmonary/Chest: Effort normal and breath sounds without rales or wheezing.                Abd:  Soft, NT, ND, + BS, no organomegaly               Neurological: Pt is alert. At baseline orientation, motor grossly intact               Skin: Skin is warm. No rashes, no other new lesions, LE edema - none               Psychiatric: Pt behavior is normal without agitation   Micro: none  Cardiac tracings I have personally interpreted today:  none  Pertinent Radiological findings (summarize): none   Lab Results  Component Value Date   WBC 5.7 11/12/2021   HGB 10.3 (L) 11/12/2021   HCT 32.4 (L) 11/12/2021   PLT 238.0 11/12/2021   GLUCOSE 61 (L) 11/12/2021   CHOL 149 11/12/2021   TRIG 154.0 (H) 11/12/2021   HDL 54.70 11/12/2021   LDLDIRECT 83.0 04/21/2021   LDLCALC 64 11/12/2021   ALT 11 11/12/2021   AST 15 11/12/2021   NA 136 11/12/2021   K 4.8 11/12/2021   CL 105 11/12/2021   CREATININE 1.95 (H) 11/12/2021   BUN 27 (H) 11/12/2021   CO2 25 11/12/2021   TSH 4.17 11/12/2021   INR 0.9 12/14/2020   HGBA1C 6.7 (H) 11/12/2021   MICROALBUR 2.1 (H) 11/12/2021   Assessment/Plan:  Adreana Coull is a 76 y.o. Black or African American [2] female with  has a past medical history of ALLERGIC RHINITIS, ANXIETY, COLONIC POLYPS, HX OF, DEPRESSION, DIABETES MELLITUS, TYPE II, Diverticulitis, DIVERTICULOSIS, COLON, Esophageal stricture, Gallstones, GERD, HIATAL HERNIA, History of blood transfusion, HYPERLIPIDEMIA, Hypersomnolence, HYPERTENSION, INTERMITTENT VERTIGO, OBESITY, OSTEOARTHRITIS, OSTEOPENIA, Pneumonia, and Post-menopausal.  Type 2 diabetes mellitus with  hyperlipidemia ([place]) Lab Results  Component Value Date   HGBA1C 6.7 (H) 11/12/2021   Uncontrolled,  pt to increase farxiga 10 mg, may need to coordinate with nephrology as they are administering the pt assist program for her  Hyperlipidemia Lab Results  Component Value Date   [place] 64 11/12/2021   Stable, pt to continue current statin lipitor 20 mg qd   Essential hypertension BP Readings from Last 3 Encounters:  05/26/22 120/70  04/17/22 102/65  02/12/22 122/70   Stable, pt to continue medical treatment lisinopril 5 mg qd   CKD (chronic kidney disease) Lab Results  Component Value Date   CREATININE 1.95 (H) 11/12/2021   Stable overall, cont to avoid nephrotoxins, f/u renal as planned  Followup: Return in about 3 months (around 08/17/2022), or if symptoms worsen or fail to improve.  Cathlean Cower, MD 05/26/2022 8:13 PM [facility]

## 2022-06-09 ENCOUNTER — Other Ambulatory Visit: Payer: Self-pay | Admitting: Internal Medicine

## 2022-06-16 ENCOUNTER — Other Ambulatory Visit: Payer: Self-pay | Admitting: Internal Medicine

## 2022-07-01 ENCOUNTER — Other Ambulatory Visit: Payer: Self-pay

## 2022-07-01 MED ORDER — ACCU-CHEK GUIDE ME W/DEVICE KIT: PACK | 0 refills | Status: AC

## 2022-07-03 ENCOUNTER — Other Ambulatory Visit: Payer: Self-pay | Admitting: Internal Medicine

## 2022-07-03 ENCOUNTER — Ambulatory Visit (INDEPENDENT_AMBULATORY_CARE_PROVIDER_SITE_OTHER): Payer: Medicare Other | Admitting: Internal Medicine

## 2022-07-03 ENCOUNTER — Encounter: Payer: Self-pay | Admitting: Internal Medicine

## 2022-07-03 ENCOUNTER — Ambulatory Visit (INDEPENDENT_AMBULATORY_CARE_PROVIDER_SITE_OTHER): Payer: Medicare Other

## 2022-07-03 VITALS — BP 110/64 | HR 70 | Temp 97.7°F | Ht <= 58 in | Wt 167.0 lb

## 2022-07-03 DIAGNOSIS — E1169 Type 2 diabetes mellitus with other specified complication: Secondary | ICD-10-CM

## 2022-07-03 DIAGNOSIS — M545 Low back pain, unspecified: Secondary | ICD-10-CM

## 2022-07-03 DIAGNOSIS — R109 Unspecified abdominal pain: Secondary | ICD-10-CM

## 2022-07-03 DIAGNOSIS — R079 Chest pain, unspecified: Secondary | ICD-10-CM

## 2022-07-03 DIAGNOSIS — E785 Hyperlipidemia, unspecified: Secondary | ICD-10-CM | POA: Diagnosis not present

## 2022-07-03 DIAGNOSIS — I10 Essential (primary) hypertension: Secondary | ICD-10-CM | POA: Diagnosis not present

## 2022-07-03 LAB — URINALYSIS, ROUTINE W REFLEX MICROSCOPIC
Bilirubin Urine: NEGATIVE
Hgb urine dipstick: NEGATIVE
Ketones, ur: NEGATIVE
Leukocytes,Ua: NEGATIVE
Nitrite: NEGATIVE
Specific Gravity, Urine: 1.01 (ref 1.000–1.030)
Total Protein, Urine: NEGATIVE
Urine Glucose: >=1000 — AB
Urobilinogen, UA: 0.2 (ref 0.0–1.0)
pH: 5.5 (ref 5.0–8.0)

## 2022-07-03 MED ORDER — CYCLOBENZAPRINE HCL 5 MG PO TABS: 5.0000 mg | ORAL_TABLET | Freq: Three times a day (TID) | ORAL | 1 refills | Status: AC | PRN

## 2022-07-03 MED ORDER — TRAMADOL HCL 50 MG PO TABS: 50.0000 mg | ORAL_TABLET | Freq: Four times a day (QID) | ORAL | 2 refills | Status: DC | PRN

## 2022-07-03 NOTE — Patient Instructions (Signed)
lease take all new medication as prescribed - the tramadol as needed for pain, and muscle relaxer as needed  Please continue all other medications as before, and refills have been done if requested.  Please have the pharmacy call with any other refills you may need.  Please continue your efforts at being more active, low cholesterol diet, and weight control.  Please keep your appointments with your specialists as you may have planned  Please go to the XRAY Department in the first floor for the x-ray testing  Please go to the LAB at the blood drawing area for the tests to be done - just the urine testing today  You will be contacted by phone if any changes need to be made immediately.  Otherwise, you will receive a letter about your results with an explanation, but please check with MyChart first.  Please remember to sign up for MyChart if you have not done so, as this will be important to you in the future with finding out test results, communicating by private email, and scheduling acute appointments online when needed.

## 2022-07-03 NOTE — Progress Notes (Unsigned)
Patient ID: Cindy Larson, female   DOB: Feb 21, 1946, 76 y.o.   MRN: [id number]        Chief Complaint: follow up left flank and side pain, dm, htn       HPI:  Cindy Larson is a 76 y.o. female here with c/o acute onset left flank and lower back side pain for 2-3 days, mild now moderate, constant, sore to touch, worse in certain positions such as lying her right side (makes the left worse), but without LE pain, numbness or weakness.  No recent falls or trauma, heavy lifting or other unusual activity. Even deep breathing leads to bilateral thorac pain and right side pain.   Denies urinary symptoms such as dysuria, frequency, urgency, flank pain, hematuria or n/v, fever, chills.  Denies worsening reflux, abd pain, dysphagia, n/v, bowel change or blood.  Pt denies increased sob or doe, wheezing, orthopnea, PND, increased LE swelling, palpitations, dizziness or syncope.       Wt Readings from Last 3 Encounters:  07/03/22 167 lb (75.8 kg)  05/26/22 167 lb (75.8 kg)  04/17/22 168 lb (76.2 kg)   BP Readings from Last 3 Encounters:  07/03/22 110/64  05/26/22 120/70  04/17/22 102/65         Past Medical History:  Diagnosis Date   ALLERGIC RHINITIS    ANXIETY    COLONIC POLYPS, HX OF    DEPRESSION    DIABETES MELLITUS, TYPE II    Diverticulitis    DIVERTICULOSIS, COLON    Esophageal stricture    Gallstones    GERD    HIATAL HERNIA    History of blood transfusion    left knee replacement   HYPERLIPIDEMIA    Hypersomnolence    HYPERTENSION    INTERMITTENT VERTIGO    OBESITY    OSTEOARTHRITIS    L knee   OSTEOPENIA    Pneumonia    Post-menopausal    hormone replacement therapy   Past Surgical History:  Procedure Laterality Date   ABDOMINAL HYSTERECTOMY     CESAREAN SECTION     CHOLECYSTECTOMY     COLONOSCOPY  03/02/2017   [name]   Lysis of Adhesions     OOPHORECTOMY     one   POLYPECTOMY     ROTATOR CUFF REPAIR Left    TOTAL KNEE ARTHROPLASTY Left      reports that she has never smoked. She has never used smokeless tobacco. She reports that she does not drink alcohol and does not use drugs. family history includes Atrial fibrillation in her mother; Breast cancer in her maternal grandmother and paternal aunt; Cancer in her mother; Colon cancer in her paternal aunt; Diabetes in an other family member; Heart disease in her mother; Heart failure in her brother; Hypertension in her mother; Kidney disease in her maternal aunt; Lung cancer in her brother; Ovarian cancer in her sister. Allergies  Allergen Reactions   Aspirin Nausea Only and Other (See Comments)    Made the stomach "hurt"   Fexofenadine Other (See Comments)    "This makes me feel badly. I didn't feel well when I took it"   Nsaids Nausea Only and Other (See Comments)    The patient CAN tolerate Tramadol and Tylenol- no Aleve or Motrin, though   Penicillins Hives and Other (See Comments)    Blisters, also   Prevnar [Pneumococcal 13-Val Conj Vacc] Swelling and Other (See Comments)    Left arm became swollen and welts appeared   Zocor [  Simvastatin] Itching   Current Outpatient Medications on File Prior to Visit  Medication Sig Dispense Refill   Accu-Chek Softclix Lancets lancets USE 1  TO CHECK GLUCOSE 4 TIMES DAILY 400 each 0   acetaminophen (TYLENOL) 500 MG tablet Take 500-1,000 mg by mouth every 8 (eight) hours as needed (for pain).     ALPRAZolam (XANAX) 0.25 MG tablet Take 1 tablet by mouth twice daily as needed for anxiety 60 tablet 5   atorvastatin (LIPITOR) 20 MG tablet Take 1 tablet by mouth once daily 90 tablet 0   Blood Glucose Monitoring Suppl (ACCU-CHEK GUIDE ME) w/Device KIT Use as directed four times per day E11.9 1 kit 0   cetirizine (ZYRTEC) 5 MG tablet Take 1 tablet (5 mg total) by mouth daily. 30 tablet 11   Cholecalciferol (VITAMIN D3) 50 MCG (2000 UT) TABS Take 2,000 Units by mouth daily.     citalopram (CELEXA) 10 MG tablet Take 1 tablet by mouth once daily 90  tablet 2   clopidogrel (PLAVIX) 75 MG tablet Take 1 tablet by mouth once daily 90 tablet 2   dapagliflozin propanediol (FARXIGA) 10 MG TABS tablet Take 1 tablet (10 mg total) by mouth daily before breakfast. 90 tablet 3   fluticasone (FLONASE) 50 MCG/ACT nasal spray Place 2 sprays into both nostrils daily. 16 g 0   gabapentin (NEURONTIN) 300 MG capsule Take 1 capsule (300 mg total) by mouth 3 (three) times daily. 270 capsule 1   glucose blood (ACCU-CHEK GUIDE) test strip USE AS INSTRUCTED FOUR TIMES DAILY. 200 each 0   lisinopril (ZESTRIL) 5 MG tablet Take 1 tablet by mouth once daily 90 tablet 3   meclizine (ANTIVERT) 25 MG tablet TAKE 1 TABLET BY MOUTH EVERY 6 HOURS AS NEEDED FOR DIZZINESS 30 tablet 0   mupirocin ointment (BACTROBAN) 2 % Apply 1 Application topically 2 (two) times daily. To affected area till better 22 g 0   omeprazole (PRILOSEC) 20 MG capsule TAKE 1 CAPSULE BY MOUTH TWICE DAILY BEFORE MEAL(S) 180 capsule 3   PARoxetine (PAXIL) 20 MG tablet Take 1 tablet (20 mg total) by mouth daily. 90 tablet 3   Polyvinyl Alcohol-Povidone (REFRESH OP) Apply to eye.     promethazine (PHENERGAN) 25 MG tablet TAKE ONE TABLET BY MOUTH EVERY 6 HOURS AS NEEDED FOR NAUSEA (Patient taking differently: Take 25 mg by mouth every 6 (six) hours as needed for nausea.) 40 tablet 0   triamcinolone (NASACORT) 55 MCG/ACT AERO nasal inhaler Place 2 sprays into the nose daily. (Patient taking differently: Place 2 sprays into the nose daily as needed (for allergies or sinus issues).) 1 Inhaler 12   triamcinolone cream (KENALOG) 0.1 % Apply 1 Application topically 2 (two) times daily. 30 g 1   No current facility-administered medications on file prior to visit.        ROS:  All others reviewed and negative.  Objective        PE:  BP 110/64   Pulse 70   Temp 97.7 F (36.5 C)   Ht _0  (1.473 m)   Wt 167 lb (75.8 kg)   SpO2 98%   BMI 34.90 kg/m                 Constitutional: Pt appears in NAD                HENT: Head: NCAT.                Right Ear:  External ear normal.                 Left Ear: External ear normal.                Eyes: . Pupils are equal, round, and reactive to light. Conjunctivae and EOM are normal               Nose: without d/c or deformity               Neck: Neck supple. Gross normal ROM               Cardiovascular: Normal rate and regular rhythm.                 Pulmonary/Chest: Effort normal and breath sounds without rales or wheezing.                Abd:  Soft, NT, ND, + BS, no organomegaly                Spine nontender but has diffuse left lower back and flank area soreness and spasm, tender, no rash or swelling               Neurological: Pt is alert. At baseline orientation, motor grossly intact               Skin: Skin is warm. No rashes, no other new lesions, LE edema - none               Psychiatric: Pt behavior is normal without agitation   Micro: none  Cardiac tracings I have personally interpreted today:  none  Pertinent Radiological findings (summarize): none   Lab Results  Component Value Date   WBC 5.7 11/12/2021   HGB 10.3 (L) 11/12/2021   HCT 32.4 (L) 11/12/2021   PLT 238.0 11/12/2021   GLUCOSE 61 (L) 11/12/2021   CHOL 149 11/12/2021   TRIG 154.0 (H) 11/12/2021   HDL 54.70 11/12/2021   LDLDIRECT 83.0 04/21/2021   LDLCALC 64 11/12/2021   ALT 11 11/12/2021   AST 15 11/12/2021   NA 136 11/12/2021   K 4.8 11/12/2021   CL 105 11/12/2021   CREATININE 1.95 (H) 11/12/2021   BUN 27 (H) 11/12/2021   CO2 25 11/12/2021   TSH 4.17 11/12/2021   INR 0.9 12/14/2020   HGBA1C 6.7 (H) 11/12/2021   MICROALBUR 2.1 (H) 11/12/2021   Assessment/Plan:  Tayana Shankle is a 76 y.o. Black or African American [2] female with  has a past medical history of ALLERGIC RHINITIS, ANXIETY, COLONIC POLYPS, HX OF, DEPRESSION, DIABETES MELLITUS, TYPE II, Diverticulitis, DIVERTICULOSIS, COLON, Esophageal stricture, Gallstones, GERD, HIATAL HERNIA, History  of blood transfusion, HYPERLIPIDEMIA, Hypersomnolence, HYPERTENSION, INTERMITTENT VERTIGO, OBESITY, OSTEOARTHRITIS, OSTEOPENIA, Pneumonia, and Post-menopausal.  Chest pain Exam benign but with significant pleuritic pain in addition to chief complaint, for cxr  Back pain Eitlogy unclear but appears to be c/w rather large area left flank and lower back with muscular strain and spasm, now for UA and ls spine films , tramadol and flexeril 5 tid prn,.  to f/u any worsening symptoms or concerns  Essential hypertension BP Readings from Last 3 Encounters:  07/03/22 110/64  05/26/22 120/70  04/17/22 102/65   Stable, pt to continue medical treatment lsiinopril 5 ng qd    Type 2 diabetes mellitus with hyperlipidemia ([place]) Lab Results  Component Value Date   HGBA1C 6.7 (H) 11/12/2021   Stable, pt to continue current medical  treatment farxiga 10 mg qd  Followup: Return in about 6 weeks (around 08/17/2022).  Cathlean Cower, MD 07/05/2022 11:39 AM [facility]

## 2022-07-04 LAB — URINE CULTURE: Result:: NO GROWTH

## 2022-07-05 ENCOUNTER — Encounter: Payer: Self-pay | Admitting: Internal Medicine

## 2022-07-05 NOTE — Assessment & Plan Note (Signed)
BP Readings from Last 3 Encounters:  07/03/22 110/64  05/26/22 120/70  04/17/22 102/65   Stable, pt to continue medical treatment lsiinopril 5 ng qd

## 2022-07-05 NOTE — Assessment & Plan Note (Addendum)
Eitlogy unclear but appears to be c/w rather large area left flank and lower back with muscular strain and spasm, now for UA and ls spine films , tramadol and flexeril 5 tid prn,.  to f/u any worsening symptoms or concerns

## 2022-07-05 NOTE — Assessment & Plan Note (Signed)
Lab Results  Component Value Date   HGBA1C 6.7 (H) 11/12/2021   Stable, pt to continue current medical treatment farxiga 10 mg qd

## 2022-07-05 NOTE — Assessment & Plan Note (Signed)
Exam benign but with significant pleuritic pain in addition to chief complaint, for cxr

## 2022-08-05 DIAGNOSIS — E785 Hyperlipidemia, unspecified: Secondary | ICD-10-CM | POA: Diagnosis not present

## 2022-08-05 DIAGNOSIS — Z8673 Personal history of transient ischemic attack (TIA), and cerebral infarction without residual deficits: Secondary | ICD-10-CM | POA: Diagnosis not present

## 2022-08-05 DIAGNOSIS — I129 Hypertensive chronic kidney disease with stage 1 through stage 4 chronic kidney disease, or unspecified chronic kidney disease: Secondary | ICD-10-CM | POA: Diagnosis not present

## 2022-08-05 DIAGNOSIS — N184 Chronic kidney disease, stage 4 (severe): Secondary | ICD-10-CM | POA: Diagnosis not present

## 2022-08-05 DIAGNOSIS — E1122 Type 2 diabetes mellitus with diabetic chronic kidney disease: Secondary | ICD-10-CM | POA: Diagnosis not present

## 2022-08-17 ENCOUNTER — Encounter: Payer: Self-pay | Admitting: Internal Medicine

## 2022-08-17 ENCOUNTER — Ambulatory Visit (INDEPENDENT_AMBULATORY_CARE_PROVIDER_SITE_OTHER): Payer: Medicare Other | Admitting: Internal Medicine

## 2022-08-17 VITALS — BP 114/62 | HR 83 | Temp 98.0°F | Ht <= 58 in | Wt 165.0 lb

## 2022-08-17 DIAGNOSIS — E782 Mixed hyperlipidemia: Secondary | ICD-10-CM | POA: Diagnosis not present

## 2022-08-17 DIAGNOSIS — E1169 Type 2 diabetes mellitus with other specified complication: Secondary | ICD-10-CM

## 2022-08-17 DIAGNOSIS — E785 Hyperlipidemia, unspecified: Secondary | ICD-10-CM | POA: Diagnosis not present

## 2022-08-17 DIAGNOSIS — I10 Essential (primary) hypertension: Secondary | ICD-10-CM | POA: Diagnosis not present

## 2022-08-17 DIAGNOSIS — N1831 Chronic kidney disease, stage 3a: Secondary | ICD-10-CM | POA: Diagnosis not present

## 2022-08-17 LAB — POCT GLYCOSYLATED HEMOGLOBIN (HGB A1C): Hemoglobin A1C: 9.7 % — AB (ref 4.0–5.6)

## 2022-08-17 MED ORDER — RYBELSUS 3 MG PO TABS: 3.0000 mg | ORAL_TABLET | Freq: Every day | ORAL | 0 refills | Status: DC

## 2022-08-17 MED ORDER — RYBELSUS 7 MG PO TABS: 7.0000 mg | ORAL_TABLET | Freq: Every day | ORAL | 3 refills | Status: DC

## 2022-08-17 NOTE — Assessment & Plan Note (Signed)
BP Readings from Last 3 Encounters:  08/17/22 114/62  07/03/22 110/64  05/26/22 120/70   Stable, pt to continue medical treatment  - diet, wt control, farxiga

## 2022-08-17 NOTE — Assessment & Plan Note (Signed)
Lab Results  Component Value Date   HGBA1C 9.7 (A) 08/17/2022   Severe uncontrolled mostly due to diet per pt, pt to continue current medical treatment farxiga 10 mg qd, but add rybelsus 3 mg x 1 mo, then 7 mg qd after

## 2022-08-17 NOTE — Progress Notes (Signed)
Patient ID: Cindy Larson, female   DOB: 21-Dec-1945, 76 y.o.   MRN: [id number]        Chief Complaint: follow up HTN, HLD and hyperglycemia , ckd4       HPI:  Cindy Larson is a 76 y.o. female here overall doing ok;  Pt denies increased sob or doe, wheezing, orthopnea, PND, increased LE swelling, palpitations, dizziness or syncope, but does have again bilateral costal margin pain and soreness, recent nov 2023 cxr neg, but pain still mild pleuritic.Marland Kitchen   Pt denies polydipsia, polyuria, or new focal neuro s/s.    Pt denies fever, wt loss, night sweats, loss of appetite, or other constitutional symptoms  No low sugars.  Tolerating foraxiga 10 mg.  Saw renal last approx 2 wks ago, labs stable per pt.    Wt Readings from Last 3 Encounters:  08/17/22 165 lb (74.8 kg)  07/03/22 167 lb (75.8 kg)  05/26/22 167 lb (75.8 kg)   BP Readings from Last 3 Encounters:  08/17/22 114/62  07/03/22 110/64  05/26/22 120/70         Past Medical History:  Diagnosis Date   ALLERGIC RHINITIS    ANXIETY    COLONIC POLYPS, HX OF    DEPRESSION    DIABETES MELLITUS, TYPE II    Diverticulitis    DIVERTICULOSIS, COLON    Esophageal stricture    Gallstones    GERD    HIATAL HERNIA    History of blood transfusion    left knee replacement   HYPERLIPIDEMIA    Hypersomnolence    HYPERTENSION    INTERMITTENT VERTIGO    OBESITY    OSTEOARTHRITIS    L knee   OSTEOPENIA    Pneumonia    Post-menopausal    hormone replacement therapy   Past Surgical History:  Procedure Laterality Date   ABDOMINAL HYSTERECTOMY     CESAREAN SECTION     CHOLECYSTECTOMY     COLONOSCOPY  03/02/2017   [name]   Lysis of Adhesions     OOPHORECTOMY     one   POLYPECTOMY     ROTATOR CUFF REPAIR Left    TOTAL KNEE ARTHROPLASTY Left     reports that she has never smoked. She has never used smokeless tobacco. She reports that she does not drink alcohol and does not use drugs. family history includes Atrial  fibrillation in her mother; Breast cancer in her maternal grandmother and paternal aunt; Cancer in her mother; Colon cancer in her paternal aunt; Diabetes in an other family member; Heart disease in her mother; Heart failure in her brother; Hypertension in her mother; Kidney disease in her maternal aunt; Lung cancer in her brother; Ovarian cancer in her sister. Allergies  Allergen Reactions   Aspirin Nausea Only and Other (See Comments)    Made the stomach "hurt"   Fexofenadine Other (See Comments)    "This makes me feel badly. I didn't feel well when I took it"   Nsaids Nausea Only and Other (See Comments)    The patient CAN tolerate Tramadol and Tylenol- no Aleve or Motrin, though   Penicillins Hives and Other (See Comments)    Blisters, also   Prevnar [Pneumococcal 13-Val Conj Vacc] Swelling and Other (See Comments)    Left arm became swollen and welts appeared   Zocor [Simvastatin] Itching   Current Outpatient Medications on File Prior to Visit  Medication Sig Dispense Refill   Accu-Chek Softclix Lancets lancets USE 1  TO  CHECK GLUCOSE 4 TIMES DAILY 400 each 0   acetaminophen (TYLENOL) 500 MG tablet Take 500-1,000 mg by mouth every 8 (eight) hours as needed (for pain).     ALPRAZolam (XANAX) 0.25 MG tablet Take 1 tablet by mouth twice daily as needed for anxiety 60 tablet 5   atorvastatin (LIPITOR) 20 MG tablet Take 1 tablet by mouth once daily 90 tablet 0   Blood Glucose Monitoring Suppl (ACCU-CHEK GUIDE ME) w/Device KIT Use as directed four times per day E11.9 1 kit 0   cetirizine (ZYRTEC) 5 MG tablet Take 1 tablet (5 mg total) by mouth daily. 30 tablet 11   Cholecalciferol (VITAMIN D3) 50 MCG (2000 UT) TABS Take 2,000 Units by mouth daily.     citalopram (CELEXA) 10 MG tablet Take 1 tablet by mouth once daily 90 tablet 2   clopidogrel (PLAVIX) 75 MG tablet Take 1 tablet by mouth once daily 90 tablet 2   cyclobenzaprine (FLEXERIL) 5 MG tablet Take 1 tablet (5 mg total) by mouth 3  (three) times daily as needed. 40 tablet 1   dapagliflozin propanediol (FARXIGA) 10 MG TABS tablet Take 1 tablet (10 mg total) by mouth daily before breakfast. 90 tablet 3   Fluocinolone Acetonide Body 0.01 % OIL Apply topically.     fluticasone (FLONASE) 50 MCG/ACT nasal spray Place 2 sprays into both nostrils daily. 16 g 0   gabapentin (NEURONTIN) 300 MG capsule Take 1 capsule (300 mg total) by mouth 3 (three) times daily. 270 capsule 1   glucose blood (ACCU-CHEK GUIDE) test strip USE AS INSTRUCTED FOUR TIMES DAILY. 200 each 0   lisinopril (ZESTRIL) 5 MG tablet Take 1 tablet by mouth once daily 90 tablet 3   meclizine (ANTIVERT) 25 MG tablet TAKE 1 TABLET BY MOUTH EVERY 6 HOURS AS NEEDED FOR DIZZINESS 30 tablet 0   mupirocin ointment (BACTROBAN) 2 % Apply 1 Application topically 2 (two) times daily. To affected area till better 22 g 0   omeprazole (PRILOSEC) 20 MG capsule TAKE 1 CAPSULE BY MOUTH TWICE DAILY BEFORE MEAL(S) 180 capsule 3   PARoxetine (PAXIL) 20 MG tablet Take 1 tablet (20 mg total) by mouth daily. 90 tablet 3   Polyvinyl Alcohol-Povidone (REFRESH OP) Apply to eye.     promethazine (PHENERGAN) 25 MG tablet TAKE ONE TABLET BY MOUTH EVERY 6 HOURS AS NEEDED FOR NAUSEA (Patient taking differently: Take 25 mg by mouth every 6 (six) hours as needed for nausea.) 40 tablet 0   traMADol (ULTRAM) 50 MG tablet Take 1 tablet (50 mg total) by mouth every 6 (six) hours as needed. 60 tablet 2   triamcinolone (NASACORT) 55 MCG/ACT AERO nasal inhaler Place 2 sprays into the nose daily. (Patient taking differently: Place 2 sprays into the nose daily as needed (for allergies or sinus issues).) 1 Inhaler 12   triamcinolone cream (KENALOG) 0.1 % Apply 1 Application topically 2 (two) times daily. 30 g 1   No current facility-administered medications on file prior to visit.        ROS:  All others reviewed and negative.  Objective        PE:  BP 114/62 (BP Location: Right Arm, Patient Position:  Sitting, Cuff Size: Large)   Pulse 83   Temp 98 F (36.7 C) (Oral)   Ht _0  (1.473 m)   Wt 165 lb (74.8 kg)   SpO2 97%   BMI 34.49 kg/m  Constitutional: Pt appears in NAD               HENT: Head: NCAT.                Right Ear: External ear normal.                 Left Ear: External ear normal.                Eyes: . Pupils are equal, round, and reactive to light. Conjunctivae and EOM are normal               Nose: without d/c or deformity               Neck: Neck supple. Gross normal ROM               Cardiovascular: Normal rate and regular rhythm.                 Pulmonary/Chest: Effort normal and breath sounds without rales or wheezing.                Abd:  Soft, NT, ND, + BS, no organomegaly               Neurological: Pt is alert. At baseline orientation, motor grossly intact               Skin: Skin is warm. No rashes, no other new lesions, LE edema - none               Psychiatric: Pt behavior is normal without agitation   Micro: none  Cardiac tracings I have personally interpreted today:  none  Pertinent Radiological findings (summarize): none   Lab Results  Component Value Date   WBC 5.7 11/12/2021   HGB 10.3 (L) 11/12/2021   HCT 32.4 (L) 11/12/2021   PLT 238.0 11/12/2021   GLUCOSE 61 (L) 11/12/2021   CHOL 149 11/12/2021   TRIG 154.0 (H) 11/12/2021   HDL 54.70 11/12/2021   LDLDIRECT 83.0 04/21/2021   LDLCALC 64 11/12/2021   ALT 11 11/12/2021   AST 15 11/12/2021   NA 136 11/12/2021   K 4.8 11/12/2021   CL 105 11/12/2021   CREATININE 1.95 (H) 11/12/2021   BUN 27 (H) 11/12/2021   CO2 25 11/12/2021   TSH 4.17 11/12/2021   INR 0.9 12/14/2020   HGBA1C 9.7 (A) 08/17/2022   MICROALBUR 2.1 (H) 11/12/2021             Component Ref Range & Units 11:52 (08/17/22) 9 mo ago (11/12/21) 1 yr ago (04/21/21) 1 yr ago (12/14/20) 2 yr ago (07/03/20) 2 yr ago (02/16/20) 3 yr ago (04/12/19)  Hemoglobin A1C 4.0 - 5.6 % 9.7 Abnormal  6.7 High            Assessment/Plan:  Cindy Larson is a 76 y.o. Black or African American [2] female with  has a past medical history of ALLERGIC RHINITIS, ANXIETY, COLONIC POLYPS, HX OF, DEPRESSION, DIABETES MELLITUS, TYPE II, Diverticulitis, DIVERTICULOSIS, COLON, Esophageal stricture, Gallstones, GERD, HIATAL HERNIA, History of blood transfusion, HYPERLIPIDEMIA, Hypersomnolence, HYPERTENSION, INTERMITTENT VERTIGO, OBESITY, OSTEOARTHRITIS, OSTEOPENIA, Pneumonia, and Post-menopausal.  CKD (chronic kidney disease) Lab Results  Component Value Date   CREATININE 1.95 (H) 11/12/2021   Stable overall, cont to avoid nephrotoxins, f/u renal as planned  Essential hypertension BP Readings from Last 3 Encounters:  08/17/22 114/62  07/03/22 110/64  05/26/22 120/70   Stable, pt to continue medical  treatment  - diet, wt control, farxiga   Hyperlipidemia BP Readings from Last 3 Encounters:  08/17/22 114/62  07/03/22 110/64  05/26/22 120/70   Stable, pt to continue medical treatment lipitor 20 mg qd   Type 2 diabetes mellitus with hyperlipidemia (HCC) Lab Results  Component Value Date   HGBA1C 9.7 (A) 08/17/2022   Severe uncontrolled mostly due to diet per pt, pt to continue current medical treatment farxiga 10 mg qd, but add rybelsus 3 mg x 1 mo, then 7 mg qd after  Followup: Return in about 6 months (around 02/16/2023).  Cathlean Cower, MD 08/17/2022 12:48 PM [facility]

## 2022-08-17 NOTE — Patient Instructions (Addendum)
Please consider having the RSV shot done at the pharmacy  Your A1c was done today - 9.7  Please work on better diet, and add rybelsus 3 mg per day for month #1, then 7 mg per day after that  Please continue all other medications as before, and refills have been done if requested.  Please have the pharmacy call with any other refills you may need.  Please continue your efforts at being more active, low cholesterol diet, and weight control.  Please keep your appointments with your specialists as you may have planned - kidney doctor as you do  We can hold on other lab testing since you just had Renal Appointment  Please make an Appointment to return in 6 months, or sooner if needed

## 2022-08-17 NOTE — Assessment & Plan Note (Signed)
BP Readings from Last 3 Encounters:  08/17/22 114/62  07/03/22 110/64  05/26/22 120/70   Stable, pt to continue medical treatment lipitor 20 mg qd

## 2022-08-17 NOTE — Assessment & Plan Note (Signed)
Lab Results  Component Value Date   CREATININE 1.95 (H) 11/12/2021   Stable overall, cont to avoid nephrotoxins, f/u renal as planned

## 2022-08-18 ENCOUNTER — Telehealth: Payer: Self-pay | Admitting: Internal Medicine

## 2022-08-18 MED ORDER — PIOGLITAZONE HCL 15 MG PO TABS: 15.0000 mg | ORAL_TABLET | Freq: Every day | ORAL | 3 refills | Status: DC

## 2022-08-18 NOTE — Telephone Encounter (Signed)
Patient states that she can not afford the diabetes medication that you sent in yesterday - please send something else and let patient know.  Patients:#  [phone]  Patient can not remember the name of the medication

## 2022-08-18 NOTE — Telephone Encounter (Signed)
Patient states she is unable to afford the rybelsus and would like to know if there is something else she can take that is more affordable

## 2022-08-18 NOTE — Telephone Encounter (Signed)
Ok I sent actos 15 mg once daily instead to [facility], thanks

## 2022-08-18 NOTE — Telephone Encounter (Signed)
Patient informed of medication change to pharmacy.

## 2022-08-25 ENCOUNTER — Other Ambulatory Visit: Payer: Self-pay | Admitting: Internal Medicine

## 2022-10-01 ENCOUNTER — Other Ambulatory Visit: Payer: Self-pay | Admitting: Internal Medicine

## 2022-10-05 ENCOUNTER — Other Ambulatory Visit: Payer: Self-pay | Admitting: Internal Medicine

## 2022-10-05 NOTE — Telephone Encounter (Signed)
Please refill as per office routine med refill policy (all routine meds to be refilled for 3 mo or monthly (per pt preference) up to one year from last visit, then month to month grace period for 3 mo, then further med refills will have to be denied) ? ?

## 2022-10-15 DIAGNOSIS — H04123 Dry eye syndrome of bilateral lacrimal glands: Secondary | ICD-10-CM | POA: Diagnosis not present

## 2022-10-15 DIAGNOSIS — H402234 Chronic angle-closure glaucoma, bilateral, indeterminate stage: Secondary | ICD-10-CM | POA: Diagnosis not present

## 2022-10-15 DIAGNOSIS — H25813 Combined forms of age-related cataract, bilateral: Secondary | ICD-10-CM | POA: Diagnosis not present

## 2022-11-12 ENCOUNTER — Other Ambulatory Visit: Payer: Self-pay | Admitting: Internal Medicine

## 2022-11-20 ENCOUNTER — Other Ambulatory Visit: Payer: Self-pay | Admitting: Internal Medicine

## 2022-11-20 DIAGNOSIS — Z1231 Encounter for screening mammogram for malignant neoplasm of breast: Secondary | ICD-10-CM

## 2022-11-23 ENCOUNTER — Ambulatory Visit
Admission: RE | Admit: 2022-11-23 | Discharge: 2022-11-23 | Disposition: A | Discharge status: Home / Self Care | Payer: Medicare Other | Source: Ambulatory Visit | Attending: Internal Medicine | Admitting: Internal Medicine

## 2022-11-23 DIAGNOSIS — Z1231 Encounter for screening mammogram for malignant neoplasm of breast: Secondary | ICD-10-CM

## 2022-12-24 ENCOUNTER — Telehealth: Payer: Self-pay | Admitting: Internal Medicine

## 2022-12-24 NOTE — Telephone Encounter (Signed)
Called patient to schedule Medicare Annual Wellness Visit (AWV). Left message for patient to call back and schedule Medicare Annual Wellness Visit (AWV).  Last date of AWV: 12/22/2021  Please schedule an appointment at any time with [facility].  If any questions, please contact me at [phone].  Thank you ,  Randon Goldsmith [facility] AWV TEAM Direct Dial: [phone]

## 2023-01-07 ENCOUNTER — Ambulatory Visit
Admission: RE | Admit: 2023-01-07 | Discharge: 2023-01-07 | Disposition: A | Discharge status: Home / Self Care | Payer: Medicare Other | Source: Ambulatory Visit | Attending: Internal Medicine | Admitting: Internal Medicine

## 2023-01-07 DIAGNOSIS — Z1231 Encounter for screening mammogram for malignant neoplasm of breast: Secondary | ICD-10-CM | POA: Diagnosis not present

## 2023-01-11 ENCOUNTER — Other Ambulatory Visit: Payer: Self-pay

## 2023-01-11 ENCOUNTER — Other Ambulatory Visit: Payer: Self-pay | Admitting: Internal Medicine

## 2023-01-27 ENCOUNTER — Other Ambulatory Visit: Payer: Self-pay

## 2023-01-27 ENCOUNTER — Other Ambulatory Visit: Payer: Self-pay | Admitting: Internal Medicine

## 2023-02-05 DIAGNOSIS — N184 Chronic kidney disease, stage 4 (severe): Secondary | ICD-10-CM | POA: Diagnosis not present

## 2023-02-05 DIAGNOSIS — I129 Hypertensive chronic kidney disease with stage 1 through stage 4 chronic kidney disease, or unspecified chronic kidney disease: Secondary | ICD-10-CM | POA: Diagnosis not present

## 2023-02-05 DIAGNOSIS — E1122 Type 2 diabetes mellitus with diabetic chronic kidney disease: Secondary | ICD-10-CM | POA: Diagnosis not present

## 2023-02-05 DIAGNOSIS — E1129 Type 2 diabetes mellitus with other diabetic kidney complication: Secondary | ICD-10-CM | POA: Diagnosis not present

## 2023-02-07 LAB — LAB REPORT - SCANNED
A1c: 7.9
Creatinine, POC: 108 mg/dL
EGFR: 23

## 2023-02-09 ENCOUNTER — Encounter: Payer: Self-pay | Admitting: Physician Assistant

## 2023-02-10 ENCOUNTER — Other Ambulatory Visit: Payer: Self-pay | Admitting: Internal Medicine

## 2023-02-11 ENCOUNTER — Other Ambulatory Visit: Payer: Self-pay | Admitting: Adult Health

## 2023-02-11 NOTE — Telephone Encounter (Signed)
Called Cindy Larson, Informed her a yearly follow-up was neeed. Scheduled Cindy Larson with Shanda Bumps on 10/10 @ 2:15 pm.

## 2023-02-16 ENCOUNTER — Ambulatory Visit (INDEPENDENT_AMBULATORY_CARE_PROVIDER_SITE_OTHER): Payer: Medicare Other | Admitting: Internal Medicine

## 2023-02-16 ENCOUNTER — Encounter: Payer: Self-pay | Admitting: Internal Medicine

## 2023-02-16 VITALS — BP 124/78 | HR 72 | Temp 98.2°F | Ht <= 58 in | Wt 172.0 lb

## 2023-02-16 DIAGNOSIS — I10 Essential (primary) hypertension: Secondary | ICD-10-CM | POA: Diagnosis not present

## 2023-02-16 DIAGNOSIS — N1831 Chronic kidney disease, stage 3a: Secondary | ICD-10-CM | POA: Diagnosis not present

## 2023-02-16 DIAGNOSIS — E785 Hyperlipidemia, unspecified: Secondary | ICD-10-CM

## 2023-02-16 DIAGNOSIS — Z0000 Encounter for general adult medical examination without abnormal findings: Secondary | ICD-10-CM | POA: Diagnosis not present

## 2023-02-16 DIAGNOSIS — R600 Localized edema: Secondary | ICD-10-CM

## 2023-02-16 DIAGNOSIS — E782 Mixed hyperlipidemia: Secondary | ICD-10-CM | POA: Diagnosis not present

## 2023-02-16 DIAGNOSIS — E1169 Type 2 diabetes mellitus with other specified complication: Secondary | ICD-10-CM | POA: Diagnosis not present

## 2023-02-16 DIAGNOSIS — Z7984 Long term (current) use of oral hypoglycemic drugs: Secondary | ICD-10-CM

## 2023-02-16 DIAGNOSIS — Z0001 Encounter for general adult medical examination with abnormal findings: Secondary | ICD-10-CM

## 2023-02-16 MED ORDER — SITAGLIPTIN PHOSPHATE 25 MG PO TABS: 25.0000 mg | ORAL_TABLET | Freq: Every day | ORAL | 3 refills | Status: DC

## 2023-02-16 NOTE — Patient Instructions (Signed)
Please take all new medication as prescribed - januvia  Please continue all other medications as before, and refills have been done if requested.  Please have the pharmacy call with any other refills you may need.  Please continue your efforts at being more active, low cholesterol diet, and weight control.  You are otherwise up to date with prevention measures today.  Please keep your appointments with your specialists as you may have planned  Please make an Appointment to return in 6 months, or sooner if needed

## 2023-02-16 NOTE — Progress Notes (Signed)
Patient ID: Aditi Rovira, female   DOB: Jun 16, 1946, 77 y.o.   MRN: [id number]         Chief Complaint:: wellness exam and Medical Management of Chronic Issues (6 month follow up and feet has swelled up over the weekend and hands )  , dm,. Hld, htn, ckd3a       HPI:  Sydnei Ohaver is a 77 y.o. female here for wellness exam; has eye exam for July 2024, declines covid booster, o/w up to date                        Also has neurology appt oct 2024.  Saw renal last Friday with A1cx less than 8, which is down from 9.7 x 6 mo ago with diet, as she was unable to tolerate the pioglitazone. Pt denies chest pain, increased sob or doe, wheezing, orthopnea, PND, palpitations, dizziness or syncope, but does have slight interimttent bilateral hand and feet puffiness at times, none today.   Pt denies polydipsia, polyuria, or new focal neuro s/s.    Pt denies fever, wt loss, night sweats, loss of appetite, or other constitutional symptoms     Wt Readings from Last 3 Encounters:  02/16/23 172 lb (78 kg)  08/17/22 165 lb (74.8 kg)  07/03/22 167 lb (75.8 kg)   BP Readings from Last 3 Encounters:  02/16/23 124/78  08/17/22 114/62  07/03/22 110/64   Immunization History  Administered Date(s) Administered   Fluad Quad(high Dose 65+) 05/24/2019, 06/15/2020, 06/16/2021, 05/20/2022   Influenza Split 06/29/2012   Influenza Whole 07/18/2003, 06/22/2008   Influenza, High Dose Seasonal PF 06/15/2013, 06/25/2015, 06/09/2016, 06/17/2017, 06/01/2018   Influenza,inj,Quad PF,6+ Mos 06/15/2014   PFIZER Comirnaty(Gray Top)Covid-19 Tri-Sucrose Vaccine 01/08/2021   PFIZER(Purple Top)SARS-COV-2 Vaccination 10/13/2019, 11/05/2019, 06/04/2020, 06/03/2021   PNEUMOCOCCAL CONJUGATE-20 02/06/2022   Pneumococcal Conjugate-13 06/29/2013   Pneumococcal Polysaccharide-23 08/17/2005, 10/24/2010, 08/04/2016   Td 10/24/2010   Tdap 11/09/2021   Unspecified SARS-COV-2 Vaccination 05/20/2022   Zoster Recombinat  (Shingrix) 11/09/2021, 02/06/2022   Health Maintenance Due  Topic Date Due   COVID-19 Vaccine (7 - 2023-24 season) 07/15/2022   Diabetic kidney evaluation - Urine ACR  11/13/2022   Medicare Annual Wellness (AWV)  12/23/2022   OPHTHALMOLOGY EXAM  01/27/2023      Past Medical History:  Diagnosis Date   ALLERGIC RHINITIS    ANXIETY    COLONIC POLYPS, HX OF    DEPRESSION    DIABETES MELLITUS, TYPE II    Diverticulitis    DIVERTICULOSIS, COLON    Esophageal stricture    Gallstones    GERD    HIATAL HERNIA    History of blood transfusion    left knee replacement   HYPERLIPIDEMIA    Hypersomnolence    HYPERTENSION    INTERMITTENT VERTIGO    OBESITY    OSTEOARTHRITIS    L knee   OSTEOPENIA    Pneumonia    Post-menopausal    hormone replacement therapy   Past Surgical History:  Procedure Laterality Date   ABDOMINAL HYSTERECTOMY     CESAREAN SECTION     CHOLECYSTECTOMY     COLONOSCOPY  03/02/2017   Marina Goodell   Lysis of Adhesions     OOPHORECTOMY     one   POLYPECTOMY     ROTATOR CUFF REPAIR Left    TOTAL KNEE ARTHROPLASTY Left     reports that she has never smoked. She has never used smokeless tobacco. She  reports that she does not drink alcohol and does not use drugs. family history includes Atrial fibrillation in her mother; Breast cancer in her maternal grandmother and paternal aunt; Cancer in her mother; Colon cancer in her paternal aunt; Diabetes in an other family member; Heart disease in her mother; Heart failure in her brother; Hypertension in her mother; Kidney disease in her maternal aunt; Lung cancer in her brother; Ovarian cancer in her sister. Allergies  Allergen Reactions   Aspirin Nausea Only and Other (See Comments)    Made the stomach "hurt"   Fexofenadine Other (See Comments)    "This makes me feel badly. I didn't feel well when I took it"   Nsaids Nausea Only and Other (See Comments)    The patient CAN tolerate Tramadol and Tylenol- no Aleve or  Motrin, though   Penicillins Hives and Other (See Comments)    Blisters, also   Pioglitazone    Prevnar [Pneumococcal 13-Val Conj Vacc] Swelling and Other (See Comments)    Left arm became swollen and welts appeared   Zocor [Simvastatin] Itching   Current Outpatient Medications on File Prior to Visit  Medication Sig Dispense Refill   Accu-Chek Softclix Lancets lancets USE 1  TO CHECK GLUCOSE 4 TIMES DAILY 400 each 0   acetaminophen (TYLENOL) 500 MG tablet Take 500-1,000 mg by mouth every 8 (eight) hours as needed (for pain).     ALPRAZolam (XANAX) 0.25 MG tablet Take 1 tablet by mouth twice daily as needed for anxiety 60 tablet 5   Blood Glucose Monitoring Suppl (ACCU-CHEK GUIDE ME) w/Device KIT Use as directed four times per day E11.9 1 kit 0   cetirizine (ZYRTEC) 5 MG tablet Take 1 tablet (5 mg total) by mouth daily. 30 tablet 11   Cholecalciferol (VITAMIN D3) 50 MCG (2000 UT) TABS Take 2,000 Units by mouth daily.     citalopram (CELEXA) 10 MG tablet Take 1 tablet by mouth once daily 90 tablet 0   clopidogrel (PLAVIX) 75 MG tablet Take 1 tablet by mouth once daily 90 tablet 0   cyclobenzaprine (FLEXERIL) 5 MG tablet Take 1 tablet (5 mg total) by mouth 3 (three) times daily as needed. 40 tablet 1   dapagliflozin propanediol (FARXIGA) 10 MG TABS tablet Take 1 tablet (10 mg total) by mouth daily before breakfast. 90 tablet 3   Fluocinolone Acetonide Body 0.01 % OIL Apply topically.     fluticasone (FLONASE) 50 MCG/ACT nasal spray Place 2 sprays into both nostrils daily. 16 g 0   gabapentin (NEURONTIN) 300 MG capsule Take 1 capsule (300 mg total) by mouth 3 (three) times daily. 270 capsule 1   glucose blood (ACCU-CHEK GUIDE) test strip USE AS INSTRUCTED FOUR TIMES DAILY. 200 each 0   lisinopril (ZESTRIL) 5 MG tablet TAKE 1 TABLET BY MOUTH ONCE DAILY Annual appt is overdue must see provider for future refills 30 tablet 0   meclizine (ANTIVERT) 25 MG tablet TAKE 1 TABLET BY MOUTH EVERY 6 HOURS  AS NEEDED FOR DIZZINESS 30 tablet 0   mupirocin ointment (BACTROBAN) 2 % Apply 1 Application topically 2 (two) times daily. To affected area till better 22 g 0   omeprazole (PRILOSEC) 20 MG capsule TAKE 1 CAPSULE BY MOUTH TWICE DAILY BEFORE MEAL(S) 180 capsule 0   PARoxetine (PAXIL) 20 MG tablet Take 1 tablet (20 mg total) by mouth daily. 90 tablet 3   Polyvinyl Alcohol-Povidone (REFRESH OP) Apply to eye.     traMADol (ULTRAM) 50 MG tablet  Take 1 tablet (50 mg total) by mouth every 6 (six) hours as needed. 60 tablet 2   triamcinolone (NASACORT) 55 MCG/ACT AERO nasal inhaler Place 2 sprays into the nose daily. (Patient taking differently: Place 2 sprays into the nose daily as needed (for allergies or sinus issues).) 1 Inhaler 12   triamcinolone cream (KENALOG) 0.1 % APPLY 1 APPLICATION TOPICALLY  TWICE DAILY 30 g 0   promethazine (PHENERGAN) 25 MG tablet TAKE ONE TABLET BY MOUTH EVERY 6 HOURS AS NEEDED FOR NAUSEA (Patient not taking: Reported on 02/16/2023) 40 tablet 0   No current facility-administered medications on file prior to visit.        ROS:  All others reviewed and negative.  Objective        PE:  BP 124/78 (BP Location: Right Arm, Patient Position: Sitting, Cuff Size: Normal)   Pulse 72   Temp 98.2 F (36.8 C) (Oral)   Ht 4\' 10"  (1.473 m)   Wt 172 lb (78 kg)   SpO2 99%   BMI 35.95 kg/m                 Constitutional: Pt appears in NAD               HENT: Head: NCAT.                Right Ear: External ear normal.                 Left Ear: External ear normal.                Eyes: . Pupils are equal, round, and reactive to light. Conjunctivae and EOM are normal               Nose: without d/c or deformity               Neck: Neck supple. Gross normal ROM               Cardiovascular: Normal rate and regular rhythm.                 Pulmonary/Chest: Effort normal and breath sounds without rales or wheezing.                Abd:  Soft, NT, ND, + BS, no organomegaly                Neurological: Pt is alert. At baseline orientation, motor grossly intact               Skin: Skin is warm. No rashes, no other new lesions, LE edema - none               Psychiatric: Pt behavior is normal without agitation   Micro: none  Cardiac tracings I have personally interpreted today:  none  Pertinent Radiological findings (summarize): none   Lab Results  Component Value Date   WBC 5.7 11/12/2021   HGB 10.3 (L) 11/12/2021   HCT 32.4 (L) 11/12/2021   PLT 238.0 11/12/2021   GLUCOSE 61 (L) 11/12/2021   CHOL 149 11/12/2021   TRIG 154.0 (H) 11/12/2021   HDL 54.70 11/12/2021   LDLDIRECT 83.0 04/21/2021   LDLCALC 64 11/12/2021   ALT 11 11/12/2021   AST 15 11/12/2021   NA 136 11/12/2021   K 4.8 11/12/2021   CL 105 11/12/2021   CREATININE 1.95 (H) 11/12/2021   BUN 27 (H) 11/12/2021   CO2 25 11/12/2021   TSH  4.17 11/12/2021   INR 0.9 12/14/2020   HGBA1C 9.7 (A) 08/17/2022   MICROALBUR 2.1 (H) 11/12/2021   Assessment/Plan:  Alenah Sarria is a 77 y.o. Black or African American [2] female with  has a past medical history of ALLERGIC RHINITIS, ANXIETY, COLONIC POLYPS, HX OF, DEPRESSION, DIABETES MELLITUS, TYPE II, Diverticulitis, DIVERTICULOSIS, COLON, Esophageal stricture, Gallstones, GERD, HIATAL HERNIA, History of blood transfusion, HYPERLIPIDEMIA, Hypersomnolence, HYPERTENSION, INTERMITTENT VERTIGO, OBESITY, OSTEOARTHRITIS, OSTEOPENIA, Pneumonia, and Post-menopausal.  Encounter for well adult exam with abnormal findings Age and sex appropriate education and counseling updated with regular exercise and diet Referrals for preventative services - has eye exam soon per pt Immunizations addressed - declines covid booster,  Smoking counseling  - none needed Evidence for depression or other mood disorder - none significant Most recent labs reviewed. I have personally reviewed and have noted: 1) the patient's medical and social history 2) The patient's current  medications and supplements 3) The patient's height, weight, and BMI have been recorded in the chart   Peripheral edema None today, etiology unclear, exam benign, ok to follow  Type 2 diabetes mellitus with hyperlipidemia (HCC) Lab Results  Component Value Date   HGBA1C 9.7 (A) 08/17/2022   uncontrolled, pt to continue current medical treatment farxiga 10 every day, add januvia 10 qd   Hyperlipidemia Lab Results  Component Value Date   LDLCALC 64 11/12/2021   Stable, pt to continue current statin lipitor 20 qd   Essential hypertension BP Readings from Last 3 Encounters:  02/16/23 124/78  08/17/22 114/62  07/03/22 110/64   Stable, pt to continue medical treatment lisinopril 5 mg qd   CKD (chronic kidney disease) Lab Results  Component Value Date   CREATININE 1.95 (H) 11/12/2021   Stable overall, cont to avoid nephrotoxins  Followup: Return in about 6 months (around 08/18/2023).  Oliver Barre, MD 02/20/2023 2:01 PM [facility]

## 2023-02-17 ENCOUNTER — Other Ambulatory Visit: Payer: Self-pay | Admitting: Internal Medicine

## 2023-02-20 ENCOUNTER — Encounter: Payer: Self-pay | Admitting: Internal Medicine

## 2023-02-20 DIAGNOSIS — R600 Localized edema: Secondary | ICD-10-CM | POA: Insufficient documentation

## 2023-02-20 NOTE — Assessment & Plan Note (Signed)
None today, etiology unclear, exam benign, ok to follow

## 2023-02-20 NOTE — Assessment & Plan Note (Signed)
BP Readings from Last 3 Encounters:  02/16/23 124/78  08/17/22 114/62  07/03/22 110/64   Stable, pt to continue medical treatment lisinopril 5 mg qd

## 2023-02-20 NOTE — Assessment & Plan Note (Signed)
Age and sex appropriate education and counseling updated with regular exercise and diet Referrals for preventative services - has eye exam soon per pt Immunizations addressed - declines covid booster,  Smoking counseling  - none needed Evidence for depression or other mood disorder - none significant Most recent labs reviewed. I have personally reviewed and have noted: 1) the patient's medical and social history 2) The patient's current medications and supplements 3) The patient's height, weight, and BMI have been recorded in the chart

## 2023-02-20 NOTE — Assessment & Plan Note (Signed)
Lab Results  Component Value Date   HGBA1C 9.7 (A) 08/17/2022   uncontrolled, pt to continue current medical treatment farxiga 10 every day, add januvia 10 qd

## 2023-02-20 NOTE — Assessment & Plan Note (Signed)
Lab Results  Component Value Date   LDLCALC 64 11/12/2021   Stable, pt to continue current statin lipitor 20 qd

## 2023-02-20 NOTE — Assessment & Plan Note (Signed)
Lab Results  Component Value Date   CREATININE 1.95 (H) 11/12/2021   Stable overall, cont to avoid nephrotoxins

## 2023-03-02 DIAGNOSIS — H04123 Dry eye syndrome of bilateral lacrimal glands: Secondary | ICD-10-CM | POA: Diagnosis not present

## 2023-03-02 DIAGNOSIS — H25813 Combined forms of age-related cataract, bilateral: Secondary | ICD-10-CM | POA: Diagnosis not present

## 2023-03-02 DIAGNOSIS — H402234 Chronic angle-closure glaucoma, bilateral, indeterminate stage: Secondary | ICD-10-CM | POA: Diagnosis not present

## 2023-03-02 LAB — HM DIABETES EYE EXAM

## 2023-03-25 ENCOUNTER — Other Ambulatory Visit: Payer: Self-pay

## 2023-03-25 ENCOUNTER — Other Ambulatory Visit: Payer: Self-pay | Admitting: Internal Medicine

## 2023-04-14 ENCOUNTER — Other Ambulatory Visit: Payer: Self-pay | Admitting: Internal Medicine

## 2023-04-15 ENCOUNTER — Other Ambulatory Visit: Payer: Self-pay

## 2023-04-17 ENCOUNTER — Other Ambulatory Visit: Payer: Self-pay | Admitting: Internal Medicine

## 2023-04-23 ENCOUNTER — Other Ambulatory Visit: Payer: Self-pay | Admitting: Internal Medicine

## 2023-04-26 ENCOUNTER — Other Ambulatory Visit: Payer: Self-pay

## 2023-05-06 ENCOUNTER — Encounter: Payer: Self-pay | Admitting: Adult Health

## 2023-05-06 ENCOUNTER — Ambulatory Visit: Payer: Medicare Other | Admitting: Adult Health

## 2023-05-06 VITALS — BP 112/63 | HR 76 | Ht 59.0 in | Wt 172.0 lb

## 2023-05-06 DIAGNOSIS — G459 Transient cerebral ischemic attack, unspecified: Secondary | ICD-10-CM

## 2023-05-06 MED ORDER — CLOPIDOGREL BISULFATE 75 MG PO TABS: 75.0000 mg | ORAL_TABLET | Freq: Every day | ORAL | 0 refills | Status: DC

## 2023-05-06 NOTE — Progress Notes (Signed)
[facility] [address] [place]. [place] [address] [phone]       STROKE FOLLOW UP NOTE  [name] Date of Birth:  Apr 23, 1946 Medical Record Number:  [id number]   Reason for Referral: TIA follow up    SUBJECTIVE:   CHIEF COMPLAINT:  Chief Complaint  Patient presents with   Follow-up    Rm 3, here alone Pt is here for yearly follow up on TIA. Pt states she has been doing well.      HPI:   Update 05/06/2023 JM: Patient previously seen 07/2021 and advised to follow-up as needed as stable from stroke standpoint.  Patient called requesting appointment today for routine stroke follow up. Doing well from stroke standpoint, denies new stroke/TIA symptoms. Does c/o occasional headaches, unsure how long present but at least over the past couple of years, will take tylenol with benefit, typically located at right frontal temple, denies photophobia, phonophobia or N/V. Denies vision changes. Also c/o occasional dizziness/vertigo, not new issue, uses meclizine (by PCP) which helps. Compliant on Plavix and atorvastatin.  Routinely follows with PCP Dr. Jonny Ruiz for aggressive stroke risk factor management.      History provided for reference purposes only Update 07/30/2021 JM: Returns for 47-month TIA follow-up unaccompanied  Stable since prior visit -denies new or reoccurring stroke/TIA symptoms.  Compliant on Plavix and atorvastatin -denies side effects Blood pressure today 104/72 Glucose levels checked QID, reports it is low in the AM from 60-90 and better throughout the day with meals. Takes Metformin and Glimepiride as prescribed.  Recent A1c 6.2.  Followed by PCP Dr. Jonny Ruiz.  C/o ongoing fatigue throughout the day. She does perform household chores daily and cleans but seems majority of the time she is sedentary.  She does not do any routine physical activity or exercise.  She does not sleep well at night having trouble going to sleep and awakens  frequently with nocturia. She reports taking multiple naps throughout the day.  She denies snoring but states her husband says she "makes funny noises". She has been trying to increase her daily activity and try to avoid day time napping which helps her sleep better at night. She has not previously been tested for sleep apnea. She is currently on Paxil and Celexa and does have some anxiety but denies depression  No further concerns at this time  Initial visit 01/22/2021 JM: [name] is being seen for hospital accompanied by her daughter.  She reports she has been doing well since discharge without new or reoccurring stroke/TIA symptoms.  She has remained on aspirin and Plavix despite 3-week recommendation but denies bleeding or bruising.  Remains on atorvastatin 20 mg daily without myalgias.  Blood pressure today 110/73.  Routinely monitors at home and has been stable.  Monitors glucose levels at home which have been stable since diabetic regimen adjustments by PCP and denies any additional hypoglycemic events.  No concerns at this time.  Stroke admission 12/13/2020 Cindy Larson is a 77 year old female with history of diabetes, hypertension, hyperlipidemia, obesity, vertigo admitted on 12/13/2020 for episode of left lip and tongue numbness and left leg weakness as well as slurred speech as well as hypoglycemic.  Symptoms gradually improved and resolved in 2 hours.  CT no acute abnormality.  MRI no acute infarct, MRA head and neck unremarkable.  EF 50 to 55%.  LDL 54, A1c 6.0 (down from 6.9). LE venous Doppler negative for DVT.  Concern of possible TIA vs hypoglycemic event.  Recommended DAPT for 3 weeks and Plavix alone as well as continuation of atorvastatin.  Evaluated by therapy and discharged home in stable condition without therapy needs.        ROS:   14 system review of systems performed and negative with exception of those listed in HPI  PMH:  Past Medical History:  Diagnosis  Date   ALLERGIC RHINITIS    ANXIETY    COLONIC POLYPS, HX OF    DEPRESSION    DIABETES MELLITUS, TYPE II    Diverticulitis    DIVERTICULOSIS, COLON    Esophageal stricture    Gallstones    GERD    HIATAL HERNIA    History of blood transfusion    left knee replacement   HYPERLIPIDEMIA    Hypersomnolence    HYPERTENSION    INTERMITTENT VERTIGO    OBESITY    OSTEOARTHRITIS    L knee   OSTEOPENIA    Pneumonia    Post-menopausal    hormone replacement therapy    PSH:  Past Surgical History:  Procedure Laterality Date   ABDOMINAL HYSTERECTOMY     CESAREAN SECTION     CHOLECYSTECTOMY     COLONOSCOPY  03/02/2017   Marina Goodell   Lysis of Adhesions     OOPHORECTOMY     one   POLYPECTOMY     ROTATOR CUFF REPAIR Left    TOTAL KNEE ARTHROPLASTY Left     Social History:  Social History   Socioeconomic History   Marital status: Married    Spouse name: Not on file   Number of children: 3   Years of education: Not on file   Highest education level: 12th grade  Occupational History   Occupation: Naval architect  Tobacco Use   Smoking status: Never   Smokeless tobacco: Never  Vaping Use   Vaping status: Never Used  Substance and Sexual Activity   Alcohol use: No   Drug use: No   Sexual activity: Not on file  Other Topics Concern   Not on file  Social History Narrative   Not on file   Social Determinants of Health   Financial Resource Strain: Low Risk  (02/12/2023)   Overall Financial Resource Strain (CARDIA)    Difficulty of Paying Living Expenses: Not very hard  Food Insecurity: No Food Insecurity (02/12/2023)   Hunger Vital Sign    Worried About Running Out of Food in the Last Year: Never true    Ran Out of Food in the Last Year: Never true  Transportation Needs: No Transportation Needs (02/12/2023)   PRAPARE - Administrator, Civil Service (Medical): No    Lack of Transportation (Non-Medical): No  Physical Activity: Unknown (02/12/2023)   Exercise Vital  Sign    Days of Exercise per Week: 0 days    Minutes of Exercise per Session: Not on file  Stress: No Stress Concern Present (02/12/2023)   [employer] of Occupational Health - Occupational Stress Questionnaire    Feeling of Stress : Only a little  Social Connections: Unknown (02/12/2023)   Social Connection and Isolation Panel [NHANES]    Frequency of Communication with Friends and Family: More than three times a week    Frequency of Social Gatherings with Friends and Family: Twice a week    Attends Religious Services: Patient declined    Database administrator or Organizations: Not on file    Attends Banker Meetings: Not on file    Marital  Status: Married  Catering manager Violence: Not At Risk (12/22/2021)   Humiliation, Afraid, Rape, and Kick questionnaire    Fear of Current or Ex-Partner: No    Emotionally Abused: No    Physically Abused: No    Sexually Abused: No    Family History:  Family History  Problem Relation Age of Onset   Hypertension Mother    Atrial fibrillation Mother    Heart disease Mother        CHF   Cancer Mother        Cancer in "thigh"   Ovarian cancer Sister    Lung cancer Brother        smoker   Heart failure Brother        CHF   Breast cancer Maternal Grandmother    Kidney disease Maternal Aunt    Colon cancer Paternal Aunt        originated as breast CA   Breast cancer Paternal Aunt    Diabetes Other        paternal Aunt and uncle   Esophageal cancer Neg Hx    Rectal cancer Neg Hx    Stomach cancer Neg Hx    Colon polyps Neg Hx     Medications:   Current Outpatient Medications on File Prior to Visit  Medication Sig Dispense Refill   Accu-Chek Softclix Lancets lancets USE 1  TO CHECK GLUCOSE 4 TIMES DAILY 400 each 0   acetaminophen (TYLENOL) 500 MG tablet Take 500-1,000 mg by mouth every 8 (eight) hours as needed (for pain).     ALPRAZolam (XANAX) 0.25 MG tablet Take 1 tablet by mouth twice daily as needed for anxiety  60 tablet 5   atorvastatin (LIPITOR) 20 MG tablet Take 1 tablet by mouth once daily 90 tablet 3   Blood Glucose Monitoring Suppl (ACCU-CHEK GUIDE ME) w/Device KIT Use as directed four times per day E11.9 1 kit 0   cetirizine (ZYRTEC) 5 MG tablet Take 1 tablet (5 mg total) by mouth daily. 30 tablet 11   Cholecalciferol (VITAMIN D3) 50 MCG (2000 UT) TABS Take 2,000 Units by mouth daily.     cyclobenzaprine (FLEXERIL) 5 MG tablet Take 1 tablet (5 mg total) by mouth 3 (three) times daily as needed. 40 tablet 1   dapagliflozin propanediol (FARXIGA) 10 MG TABS tablet Take 1 tablet (10 mg total) by mouth daily before breakfast. 90 tablet 3   Fluocinolone Acetonide Body 0.01 % OIL Apply topically.     fluticasone (FLONASE) 50 MCG/ACT nasal spray Place 2 sprays into both nostrils daily. 16 g 0   gabapentin (NEURONTIN) 300 MG capsule Take 1 capsule (300 mg total) by mouth 3 (three) times daily. 270 capsule 1   glucose blood (ACCU-CHEK GUIDE) test strip USE AS INSTRUCTED FOUR TIMES DAILY. 200 each 0   lisinopril (ZESTRIL) 5 MG tablet TAKE 1 TABLET BY MOUTH ONCE DAILY. APPOINTMNET IS OVERDUE MUST SEE PROVIDER FOR FUTURE REFILLS. 90 tablet 3   meclizine (ANTIVERT) 25 MG tablet TAKE 1 TABLET BY MOUTH EVERY 6 HOURS AS NEEDED FOR DIZZINESS 30 tablet 0   mupirocin ointment (BACTROBAN) 2 % Apply 1 Application topically 2 (two) times daily. To affected area till better 22 g 0   omeprazole (PRILOSEC) 20 MG capsule TAKE 1 CAPSULE BY MOUTH TWICE DAILY BEFORE MEAL(S) 180 capsule 2   PARoxetine (PAXIL) 20 MG tablet Take 1 tablet (20 mg total) by mouth daily. 90 tablet 3   Polyvinyl Alcohol-Povidone (REFRESH OP) Apply to  eye.     promethazine (PHENERGAN) 25 MG tablet TAKE 1 TABLET BY MOUTH EVERY 6 HOURS AS NEEDED FOR NAUSEA 40 tablet 0   sitaGLIPtin (JANUVIA) 25 MG tablet Take 1 tablet (25 mg total) by mouth daily. 90 tablet 3   traMADol (ULTRAM) 50 MG tablet TAKE 1 TABLET BY MOUTH EVERY 6 HOURS AS NEEDED 60 tablet 2    triamcinolone (NASACORT) 55 MCG/ACT AERO nasal inhaler Place 2 sprays into the nose daily. (Patient taking differently: Place 2 sprays into the nose daily as needed (for allergies or sinus issues).) 1 Inhaler 12   triamcinolone cream (KENALOG) 0.1 % APPLY 1 APPLICATION TOPICALLY  TWICE DAILY 30 g 0   citalopram (CELEXA) 10 MG tablet Take 1 tablet by mouth once daily (Patient not taking: Reported on 05/06/2023) 90 tablet 0   No current facility-administered medications on file prior to visit.    Allergies:   Allergies  Allergen Reactions   Aspirin Nausea Only and Other (See Comments)    Made the stomach "hurt"   Fexofenadine Other (See Comments)    "This makes me feel badly. I didn't feel well when I took it"   Nsaids Nausea Only and Other (See Comments)    The patient CAN tolerate Tramadol and Tylenol- no Aleve or Motrin, though   Penicillins Hives and Other (See Comments)    Blisters, also   Pioglitazone    Prevnar [Pneumococcal 13-Val Conj Vacc] Swelling and Other (See Comments)    Left arm became swollen and welts appeared   Zocor [Simvastatin] Itching      OBJECTIVE:  Physical Exam  Vitals:   05/06/23 1347  BP: 112/63  Pulse: 76  Weight: 172 lb (78 kg)  Height: 4\' 11"  (1.499 m)   Body mass index is 34.74 kg/m. No results found.  General: well developed, well nourished,  very pleasant elderly African-American female, seated, in no evident distress Head: head normocephalic and atraumatic.   Neck: supple with no carotid or supraclavicular bruits Cardiovascular: regular rate and rhythm, no murmurs Musculoskeletal: no deformity Skin:  no rash/petichiae Vascular:  Normal pulses all extremities   Neurologic Exam Mental Status: Awake and fully alert.   Fluent speech and language.  Oriented to place and time. Recent and remote memory intact. Attention span, concentration and fund of knowledge appropriate. Mood and affect appropriate.  Cranial Nerves: Pupils equal, briskly  reactive to light. Extraocular movements full without nystagmus. Visual fields full to confrontation. Hearing intact. Facial sensation intact. Face, tongue, palate moves normally and symmetrically.  Motor: Normal bulk and tone. Normal strength in all tested extremity muscles Sensory.: intact to touch , pinprick , position and vibratory sensation.  Coordination: Rapid alternating movements normal in all extremities. Finger-to-nose and heel-to-shin performed accurately bilaterally. Gait and Station: Arises from chair without difficulty. Stance is normal. Gait demonstrates normal stride length and balance without use of assistive device.  Reflexes: 1+ and symmetric. Toes downgoing.         ASSESSMENT: Cindy Larson is a 77 y.o. year old female presented with transient left-sided numbness/tingling around her face, lips and leg as well as dysarthria on 01/11/2021 possibly TIA vs hypoglycemic event. Vascular risk factors include DM, HTN, HLD. Does c/o occasional right-sided headaches and intermittent vertigo over the past few years     PLAN:  TIA :  Continue clopidogrel 75 mg daily  and atorvastatin 20 mg daily for secondary stroke prevention managed by PCP. Provided with 90 day refill on plavix until  f/u with PCP in December, request ongoing refills by PCP as this will be a life long medication Discussed secondary stroke prevention measures and importance of close PCP follow up for aggressive stroke risk factor management including BP goal<130/90, HLD with LDL goal<70 and DM with A1c.<7  Stroke labs: A1c 7.9 (01/2023), LDL 64 (10/2021) Advised to discuss repeat lipid panel at f/u visit with PCP  Headaches, chronic No indication for imaging or further work up as present over the past several years, prior MRI in 2022 normal. No change in characteristics or frequency.  Continue use of Tylenol as needed Advised if becomes more frequent, can further discuss with PCP for further  management  Vertigo, chronic Well-controlled with use of meclizine as needed Managed by PCP     Overall stable from stroke standpoint, no indication for routine follow-up.  Continue to follow with PCP   CC:  PCP: Corwin Levins, MD    I spent 30 minutes of face-to-face and non-face-to-face time with patient.  This included previsit chart review, lab review, study review, electronic health record documentation, and education and discussion regarding above diagnoses and treatment plan and answered all other questions to patient satisfaction  Ihor Austin, [facility]  [facility] [address] [place], [place] [address]  Phone [phone] Fax [phone] Note: This document was prepared with digital dictation and possible smart phrase technology. Any transcriptional errors that result from this process are unintentional.

## 2023-05-06 NOTE — Patient Instructions (Addendum)
Continue clopidogrel 75 mg daily  and atorvastatin  for secondary stroke prevention - these medication will be refilled and managed by your primary care provider (PCP)   May need to consider switching omeprazole to pantoprazole due to potential interaction with Plavix (omeprazole can lessen the effectiveness of Plavix) - can further discuss this with your pharmacist and/or PCP  Continue to follow up with PCP regarding cholesterol, blood pressure and diabetes management - please ensure cholesterol levels are checked at follow up visit  Maintain strict control of hypertension with blood pressure goal below 130/90, diabetes with hemoglobin A1c goal below 7.0 % and cholesterol with LDL cholesterol (bad cholesterol) goal below 70 mg/dL.   Signs of a Stroke? Follow the BEFAST method:  Balance Watch for a sudden loss of balance, trouble with coordination or vertigo Eyes Is there a sudden loss of vision in one or both eyes? Or double vision?  Face: Ask the person to smile. Does one side of the face droop or is it numb?  Arms: Ask the person to raise both arms. Does one arm drift downward? Is there weakness or numbness of a leg? Speech: Ask the person to repeat a simple phrase. Does the speech sound slurred/strange? Is the person confused ? Time: If you observe any of these signs, call 911.     As doing well from stroke standpoint, can follow up as needed at this time       Thank you for coming to see [place] at [facility]. I hope we have been able to provide you high quality care today.  You may receive a patient satisfaction survey over the next few weeks. We would appreciate your feedback and comments so that we may continue to improve ourselves and the health of our patients.

## 2023-05-13 ENCOUNTER — Telehealth: Payer: Self-pay

## 2023-05-13 ENCOUNTER — Other Ambulatory Visit (HOSPITAL_COMMUNITY): Payer: Self-pay

## 2023-05-13 NOTE — Telephone Encounter (Signed)
Pharmacy Patient Advocate Encounter   Received notification from CoverMyMeds that prior authorization for Tramadol 50mg   is required/requested.   Insurance verification completed.   The patient is insured through [facility] .   Per test claim: PA required; PA submitted to [facility] via CoverMyMeds Key/confirmation #/EOC [id number] Status is pending

## 2023-05-14 ENCOUNTER — Other Ambulatory Visit (HOSPITAL_COMMUNITY): Payer: Self-pay

## 2023-05-14 NOTE — Telephone Encounter (Signed)
Pharmacy Patient Advocate Encounter  Received notification from [facility] that Prior Authorization for Tramadol 50mg  tabs has been APPROVED from 05/13/23 to 06/12/23   PA #/Case ID/Reference #: [id number]  Placed a call to [facility] pharmacy to notify of the approval.

## 2023-06-10 ENCOUNTER — Ambulatory Visit: Payer: Medicare Other | Admitting: Adult Health

## 2023-06-23 ENCOUNTER — Encounter: Payer: Self-pay | Admitting: Pharmacist

## 2023-06-23 NOTE — Progress Notes (Signed)
Pharmacy Quality Measure Review  This patient is appearing on a report for being at risk of failing the adherence measure for diabetes medications this calendar year.   Medication: pioglitazone 15 mg Last fill date: 03/25/2023 for 90 day supply  It appears pioglitazone was discontinued by provider due to intolerance per chart. Pt will fail metric. No action as this time.  Arbutus Leas, PharmD, BCPS Clinical Pharmacist [facility] [phone]

## 2023-07-02 HISTORY — PX: WISDOM TOOTH EXTRACTION: SHX21

## 2023-07-05 DIAGNOSIS — N390 Urinary tract infection, site not specified: Secondary | ICD-10-CM | POA: Diagnosis not present

## 2023-07-19 ENCOUNTER — Other Ambulatory Visit: Payer: Self-pay

## 2023-07-19 ENCOUNTER — Other Ambulatory Visit: Payer: Self-pay | Admitting: Internal Medicine

## 2023-08-12 DIAGNOSIS — N184 Chronic kidney disease, stage 4 (severe): Secondary | ICD-10-CM | POA: Diagnosis not present

## 2023-08-12 DIAGNOSIS — I129 Hypertensive chronic kidney disease with stage 1 through stage 4 chronic kidney disease, or unspecified chronic kidney disease: Secondary | ICD-10-CM | POA: Diagnosis not present

## 2023-08-12 DIAGNOSIS — E1122 Type 2 diabetes mellitus with diabetic chronic kidney disease: Secondary | ICD-10-CM | POA: Diagnosis not present

## 2023-08-12 DIAGNOSIS — N390 Urinary tract infection, site not specified: Secondary | ICD-10-CM | POA: Diagnosis not present

## 2023-08-13 ENCOUNTER — Ambulatory Visit (INDEPENDENT_AMBULATORY_CARE_PROVIDER_SITE_OTHER): Payer: Medicare Other

## 2023-08-13 VITALS — Ht <= 58 in | Wt 172.0 lb

## 2023-08-13 DIAGNOSIS — E1169 Type 2 diabetes mellitus with other specified complication: Secondary | ICD-10-CM | POA: Diagnosis not present

## 2023-08-13 DIAGNOSIS — E785 Hyperlipidemia, unspecified: Secondary | ICD-10-CM | POA: Diagnosis not present

## 2023-08-13 DIAGNOSIS — Z0000 Encounter for general adult medical examination without abnormal findings: Secondary | ICD-10-CM | POA: Diagnosis not present

## 2023-08-13 NOTE — Progress Notes (Signed)
Subjective:   Cindy Larson is a 77 y.o. female who presents for Medicare Annual (Subsequent) preventive examination.  Visit Complete: Virtual I connected with  [name] on 08/13/23 by a audio enabled telemedicine application and verified that I am speaking with the correct person using two identifiers.  Patient Location: Home  Provider Location: Office/Clinic  I discussed the limitations of evaluation and management by telemedicine. The patient expressed understanding and agreed to proceed.  Vital Signs: Because this visit was a virtual/telehealth visit, some criteria may be missing or patient reported. Any vitals not documented were not able to be obtained and vitals that have been documented are patient reported.  Cardiac Risk Factors include: advanced age (>61men, >34 women);hypertension;Other (see comment);diabetes mellitus;dyslipidemia, Risk factor comments: TIA, CKD     Objective:    Today's Vitals   08/13/23 1446  Weight: 172 lb (78 kg)  Height: 4\' 10"  (1.473 m)   Body mass index is 35.95 kg/m.     08/13/2023    2:49 PM 12/22/2021   10:55 AM 12/14/2020    1:00 AM 01/24/2020    2:16 PM 02/03/2017    2:32 PM 01/16/2015    2:43 PM  Advanced Directives  Does Patient Have a Medical Advance Directive? No No No No No Yes  Copy of Healthcare Power of Attorney in Chart?      No - copy requested  Would patient like information on creating a medical advance directive?  No - Patient declined No - Patient declined No - Patient declined      Current Medications (verified) Outpatient Encounter Medications as of 08/13/2023  Medication Sig   Accu-Chek Softclix Lancets lancets USE 1  TO CHECK GLUCOSE 4 TIMES DAILY   acetaminophen (TYLENOL) 500 MG tablet Take 500-1,000 mg by mouth every 8 (eight) hours as needed (for pain).   ALPRAZolam (XANAX) 0.25 MG tablet Take 1 tablet by mouth twice daily as needed for anxiety   atorvastatin (LIPITOR) 20 MG tablet Take 1  tablet by mouth once daily   Blood Glucose Monitoring Suppl (ACCU-CHEK GUIDE ME) w/Device KIT Use as directed four times per day E11.9   cetirizine (ZYRTEC) 5 MG tablet Take 1 tablet (5 mg total) by mouth daily.   Cholecalciferol (VITAMIN D3) 50 MCG (2000 UT) TABS Take 2,000 Units by mouth daily.   citalopram (CELEXA) 10 MG tablet Take 1 tablet by mouth once daily   clopidogrel (PLAVIX) 75 MG tablet Take 1 tablet (75 mg total) by mouth daily.   cyclobenzaprine (FLEXERIL) 5 MG tablet Take 1 tablet (5 mg total) by mouth 3 (three) times daily as needed.   dapagliflozin propanediol (FARXIGA) 10 MG TABS tablet Take 1 tablet (10 mg total) by mouth daily before breakfast.   Fluocinolone Acetonide Body 0.01 % OIL Apply topically.   fluticasone (FLONASE) 50 MCG/ACT nasal spray Place 2 sprays into both nostrils daily.   gabapentin (NEURONTIN) 300 MG capsule Take 1 capsule (300 mg total) by mouth 3 (three) times daily.   glucose blood (ACCU-CHEK GUIDE) test strip USE AS INSTRUCTED FOUR TIMES DAILY.   lisinopril (ZESTRIL) 5 MG tablet TAKE 1 TABLET BY MOUTH ONCE DAILY. APPOINTMNET IS OVERDUE MUST SEE PROVIDER FOR FUTURE REFILLS.   meclizine (ANTIVERT) 25 MG tablet TAKE 1 TABLET BY MOUTH EVERY 6 HOURS AS NEEDED FOR DIZZINESS   mupirocin ointment (BACTROBAN) 2 % Apply 1 Application topically 2 (two) times daily. To affected area till better   omeprazole (PRILOSEC) 20 MG capsule  TAKE 1 CAPSULE BY MOUTH TWICE DAILY BEFORE MEAL(S)   PARoxetine (PAXIL) 20 MG tablet Take 1 tablet (20 mg total) by mouth daily.   Polyvinyl Alcohol-Povidone (REFRESH OP) Apply to eye.   promethazine (PHENERGAN) 25 MG tablet TAKE 1 TABLET BY MOUTH EVERY 6 HOURS AS NEEDED FOR NAUSEA   sitaGLIPtin (JANUVIA) 25 MG tablet Take 1 tablet (25 mg total) by mouth daily.   traMADol (ULTRAM) 50 MG tablet TAKE 1 TABLET BY MOUTH EVERY 6 HOURS AS NEEDED   triamcinolone (NASACORT) 55 MCG/ACT AERO nasal inhaler Place 2 sprays into the nose daily.  (Patient taking differently: Place 2 sprays into the nose daily as needed (for allergies or sinus issues).)   triamcinolone cream (KENALOG) 0.1 % APPLY 1 APPLICATION TOPICALLY  TWICE DAILY   No facility-administered encounter medications on file as of 08/13/2023.    Allergies (verified) Aspirin, Fexofenadine, Nsaids, Penicillins, Pioglitazone, Prevnar [pneumococcal 13-val conj vacc], and Zocor [simvastatin]   History: Past Medical History:  Diagnosis Date   ALLERGIC RHINITIS    ANXIETY    COLONIC POLYPS, HX OF    DEPRESSION    DIABETES MELLITUS, TYPE II    Diverticulitis    DIVERTICULOSIS, COLON    Esophageal stricture    Gallstones    GERD    HIATAL HERNIA    History of blood transfusion    left knee replacement   HYPERLIPIDEMIA    Hypersomnolence    HYPERTENSION    INTERMITTENT VERTIGO    OBESITY    OSTEOARTHRITIS    L knee   OSTEOPENIA    Pneumonia    Post-menopausal    hormone replacement therapy   Past Surgical History:  Procedure Laterality Date   ABDOMINAL HYSTERECTOMY     CESAREAN SECTION     CHOLECYSTECTOMY     COLONOSCOPY  03/02/2017   [facility]ina Goodell   Lysis of Adhesions     OOPHORECTOMY     one   POLYPECTOMY     ROTATOR CUFF REPAIR Left    TOTAL KNEE ARTHROPLASTY Left    WISDOM TOOTH EXTRACTION  07/2023   Family History  Problem Relation Age of Onset   Hypertension Mother    Atrial fibrillation Mother    Heart disease Mother        CHF   Cancer Mother        Cancer in "thigh"   Ovarian cancer Sister    Lung cancer Brother        smoker   Heart failure Brother        CHF   Breast cancer Maternal Grandmother    Kidney disease Maternal Aunt    Colon cancer Paternal Aunt        originated as breast CA   Breast cancer Paternal Aunt    Diabetes Other        paternal Aunt and uncle   Esophageal cancer Neg Hx    Rectal cancer Neg Hx    Stomach cancer Neg Hx    Colon polyps Neg Hx    Social History   Socioeconomic History   Marital status:  Married    Spouse name: Tilmon   Number of children: 3   Years of education: Not on file   Highest education level: 12th grade  Occupational History   Occupation: Naval architect  Tobacco Use   Smoking status: Never   Smokeless tobacco: Never  Vaping Use   Vaping status: Never Used  Substance and Sexual Activity   Alcohol use:  No   Drug use: No   Sexual activity: Not on file  Other Topics Concern   Not on file  Social History Narrative   Lives with husband   Social Drivers of Health   Financial Resource Strain: Low Risk  (08/13/2023)   Overall Financial Resource Strain (CARDIA)    Difficulty of Paying Living Expenses: Not hard at all  Food Insecurity: No Food Insecurity (08/13/2023)   Hunger Vital Sign    Worried About Running Out of Food in the Last Year: Never true    Ran Out of Food in the Last Year: Never true  Transportation Needs: No Transportation Needs (08/13/2023)   PRAPARE - Administrator, Civil Service (Medical): No    Lack of Transportation (Non-Medical): No  Physical Activity: Insufficiently Active (08/13/2023)   Exercise Vital Sign    Days of Exercise per Week: 3 days    Minutes of Exercise per Session: 20 min  Stress: No Stress Concern Present (08/13/2023)   [employer] of Occupational Health - Occupational Stress Questionnaire    Feeling of Stress : Not at all  Social Connections: Unknown (08/13/2023)   Social Connection and Isolation Panel [NHANES]    Frequency of Communication with Friends and Family: More than three times a week    Frequency of Social Gatherings with Friends and Family: Twice a week    Attends Religious Services: Patient declined    Database administrator or Organizations: No    Attends Engineer, structural: Never    Marital Status: Married    Tobacco Counseling Counseling given: Not Answered   Clinical Intake:  Pre-visit preparation completed: Yes  Pain : No/denies pain     Nutritional Risks:  None Diabetes: Yes CBG done?: No Did pt. bring in CBG monitor from home?: No  How often do you need to have someone help you when you read instructions, pamphlets, or other written materials from your doctor or pharmacy?: 1 - Never  Interpreter Needed?: No      Activities of Daily Living    08/13/2023    2:47 PM  In your present state of health, do you have any difficulty performing the following activities:  Hearing? 0  Vision? 0  Difficulty concentrating or making decisions? 0  Walking or climbing stairs? 0  Dressing or bathing? 0  Doing errands, shopping? 0  Preparing Food and eating ? N  Using the Toilet? N  In the past six months, have you accidently leaked urine? N  Do you have problems with loss of bowel control? N  Managing your Medications? N  Managing your Finances? N  Housekeeping or managing your Housekeeping? N    Patient Care Team: Corwin Levins, MD as PCP - Angelique Holm, Wilhemina Bonito, MD as Consulting Physician (Gastroenterology) Lynden Ang, NP as Nurse Practitioner (Obstetrics and Gynecology)  Indicate any recent Medical Services you may have received from other than [facility] providers in the past year (date may be approximate).     Assessment:   This is a routine wellness examination for [place].  Hearing/Vision screen Hearing Screening - Comments:: Denies hearing difficulties   Vision Screening - Comments:: Wears eyeglasses   Goals Addressed   None   Depression Screen    08/13/2023    2:51 PM 02/16/2023    1:06 PM 08/17/2022   11:09 AM 05/26/2022    3:52 PM 02/12/2022   11:15 AM 12/22/2021   10:56 AM 12/22/2021   10:54 AM  PHQ 2/9 Scores  PHQ - 2 Score 1 0 0 0 0 0 0  PHQ- 9 Score 2  0 0 2      Fall Risk    08/13/2023    2:49 PM 02/16/2023    1:06 PM 08/17/2022   11:09 AM 05/26/2022    3:52 PM 02/12/2022   11:15 AM  Fall Risk   Falls in the past year? 0 0 0 0 0  Number falls in past yr: 0 0   0  Injury with Fall? 0 0 0  0  Risk for fall  due to : No Fall Risks No Fall Risks No Fall Risks No Fall Risks   Follow up Falls prevention discussed;Falls evaluation completed Falls evaluation completed Falls evaluation completed Falls evaluation completed     MEDICARE RISK AT HOME: Medicare Risk at Home Any stairs in or around the home?: Yes (outside front and back) If so, are there any without handrails?: Yes Home free of loose throw rugs in walkways, pet beds, electrical cords, etc?: Yes Adequate lighting in your home to reduce risk of falls?: Yes Life alert?: No Use of a cane, walker or w/c?: No Grab bars in the bathroom?: No Shower chair or bench in shower?: Yes Elevated toilet seat or a handicapped toilet?: No  TIMED UP AND GO:  Was the test performed?  No    Cognitive Function:    01/16/2015    2:47 PM  MMSE - Mini Mental State Exam  Not completed: Unable to complete        08/13/2023    2:55 PM  6CIT Screen  What Year? 0 points  What month? 0 points  What time? 0 points  Count back from 20 0 points  Months in reverse 0 points  Repeat phrase 0 points  Total Score 0 points    Immunizations Immunization History  Administered Date(s) Administered   Fluad Quad(high Dose 65+) 05/24/2019, 06/15/2020, 06/16/2021, 05/20/2022   Influenza Split 06/29/2012   Influenza Whole 07/18/2003, 06/22/2008   Influenza, High Dose Seasonal PF 06/15/2013, 06/25/2015, 06/09/2016, 06/17/2017, 06/01/2018   Influenza,inj,Quad PF,6+ Mos 06/15/2014   PFIZER Comirnaty(Gray Top)Covid-19 Tri-Sucrose Vaccine 01/08/2021   PFIZER(Purple Top)SARS-COV-2 Vaccination 10/13/2019, 11/05/2019, 06/04/2020, 06/03/2021   PNEUMOCOCCAL CONJUGATE-20 02/06/2022   Pneumococcal Conjugate-13 06/29/2013   Pneumococcal Polysaccharide-23 08/17/2005, 10/24/2010, 08/04/2016   Td 10/24/2010   Tdap 11/09/2021   Unspecified SARS-COV-2 Vaccination 05/20/2022   Zoster Recombinant(Shingrix) 11/09/2021, 02/06/2022    TDAP status: Up to date  Flu Vaccine  status: Up to date  Pneumococcal vaccine status: Up to date  Covid-19 vaccine status: Completed vaccines  Qualifies for Shingles Vaccine? Yes   Zostavax completed Yes   Shingrix Completed?: Yes  Screening Tests Health Maintenance  Topic Date Due   Diabetic kidney evaluation - Urine ACR  11/13/2022   INFLUENZA VACCINE  04/01/2023   COVID-19 Vaccine (7 - 2024-25 season) 05/02/2023   HEMOGLOBIN A1C  08/09/2023   FOOT EXAM  08/18/2023   Diabetic kidney evaluation - eGFR measurement  02/07/2024   OPHTHALMOLOGY EXAM  03/01/2024   Medicare Annual Wellness (AWV)  08/12/2024   Colonoscopy  10/08/2025   DTaP/Tdap/Td (3 - Td or Tdap) 11/10/2031   Pneumonia Vaccine 31+ Years old  Completed   DEXA SCAN  Completed   Hepatitis C Screening  Completed   Zoster Vaccines- Shingrix  Completed   HPV VACCINES  Aged Out    Health Maintenance  Health Maintenance Due  Topic Date Due  Diabetic kidney evaluation - Urine ACR  11/13/2022   INFLUENZA VACCINE  04/01/2023   COVID-19 Vaccine (7 - 2024-25 season) 05/02/2023   HEMOGLOBIN A1C  08/09/2023    Colorectal cancer screening: Type of screening: Colonoscopy. Completed 10/08/2020. Repeat every 5 years  Mammogram status: Completed 01/08/2023. Repeat every year  Bone Density status: Completed 01/21/2015. Results reflect: Bone density results: NORMAL. Repeat every 2 years.  Lung Cancer Screening: (Low Dose CT Chest recommended if Age 3-80 years, 20 pack-year currently smoking OR have quit w/in 15years.) does not qualify.   Lung Cancer Screening Referral: n/A  Additional Screening:  Hepatitis C Screening: does qualify; Completed 01/31/2016  Vision Screening: Recommended annual ophthalmology exams for early detection of glaucoma and other disorders of the eye. Is the patient up to date with their annual eye exam?  Yes  Who is the provider or what is the name of the office in which the patient attends annual eye exams? Dr. Mertie Moores If pt is  not established with a provider, would they like to be referred to a provider to establish care? No .   Dental Screening: Recommended annual dental exams for proper oral hygiene   Community Resource Referral / Chronic Care Management: CRR required this visit?  No   CCM required this visit?  No     Plan:     I have personally reviewed and noted the following in the patient's chart:   Medical and social history Use of alcohol, tobacco or illicit drugs  Current medications and supplements including opioid prescriptions. Patient is not currently taking opioid prescriptions. Functional ability and status Nutritional status Physical activity Advanced directives List of other physicians Hospitalizations, surgeries, and ER visits in previous 12 months Vitals Screenings to include cognitive, depression, and falls Referrals and appointments  In addition, I have reviewed and discussed with patient certain preventive protocols, quality metrics, and best practice recommendations. A written personalized care plan for preventive services as well as general preventive health recommendations were provided to patient.     Rejina Odle L Lauralynn Loeb, CMA   08/13/2023   After Visit Summary: (MyChart) Due to this being a telephonic visit, the after visit summary with patients personalized plan was offered to patient via MyChart   Nurse Notes: Patient is due for a A1C and a UACR.  She is up to date with all other health maintenance.  Patient had no other concerns to address today.

## 2023-08-13 NOTE — Patient Instructions (Addendum)
[name] , Thank you for taking time to come for your Medicare Wellness Visit. I appreciate your ongoing commitment to your health goals. Please review the following plan we discussed and let me know if I can assist you in the future.   Referrals/Orders/Follow-Ups/Clinician Recommendations: You are due for an A1C reading and a Diabetic kidney evaluation, which will be done at your up coming visit.  Wishing you a Cindy Larson.   This is a list of the screening recommended for you and due dates:  Health Maintenance  Topic Date Due   Yearly kidney health urinalysis for diabetes  11/13/2022   Hemoglobin A1C  08/18/2023*   Complete foot exam   08/18/2023   Yearly kidney function blood test for diabetes  02/07/2024   Eye exam for diabetics  03/01/2024   Medicare Annual Wellness Visit  08/12/2024   Colon Cancer Screening  10/08/2025   DTaP/Tdap/Td vaccine (3 - Td or Tdap) 11/10/2031   Pneumonia Vaccine  Completed   Flu Shot  Completed   DEXA scan (bone density measurement)  Completed   COVID-19 Vaccine  Completed   Hepatitis C Screening  Completed   Zoster (Shingles) Vaccine  Completed   HPV Vaccine  Aged Out  *Topic was postponed. The date shown is not the original due date.    Advanced directives: (Declined) Advance directive discussed with you today. Even though you declined this today, please call our office should you change your mind, and we can give you the proper paperwork for you to fill out.  Next Medicare Annual Wellness Visit scheduled for next year: Yes

## 2023-08-18 ENCOUNTER — Encounter: Payer: Self-pay | Admitting: Internal Medicine

## 2023-08-18 ENCOUNTER — Other Ambulatory Visit: Payer: Self-pay | Admitting: Internal Medicine

## 2023-08-18 ENCOUNTER — Ambulatory Visit (INDEPENDENT_AMBULATORY_CARE_PROVIDER_SITE_OTHER): Payer: Medicare Other | Admitting: Internal Medicine

## 2023-08-18 VITALS — BP 120/68 | HR 93 | Temp 98.9°F | Ht <= 58 in | Wt 171.0 lb

## 2023-08-18 DIAGNOSIS — E1169 Type 2 diabetes mellitus with other specified complication: Secondary | ICD-10-CM

## 2023-08-18 DIAGNOSIS — I10 Essential (primary) hypertension: Secondary | ICD-10-CM

## 2023-08-18 DIAGNOSIS — E785 Hyperlipidemia, unspecified: Secondary | ICD-10-CM | POA: Diagnosis not present

## 2023-08-18 DIAGNOSIS — R1032 Left lower quadrant pain: Secondary | ICD-10-CM | POA: Diagnosis not present

## 2023-08-18 DIAGNOSIS — Z7984 Long term (current) use of oral hypoglycemic drugs: Secondary | ICD-10-CM

## 2023-08-18 DIAGNOSIS — K921 Melena: Secondary | ICD-10-CM | POA: Insufficient documentation

## 2023-08-18 DIAGNOSIS — N184 Chronic kidney disease, stage 4 (severe): Secondary | ICD-10-CM

## 2023-08-18 LAB — HEPATIC FUNCTION PANEL
ALT: 10 U/L (ref 0–35)
AST: 15 U/L (ref 0–37)
Albumin: 4.2 g/dL (ref 3.5–5.2)
Alkaline Phosphatase: 54 U/L (ref 39–117)
Bilirubin, Direct: 0.1 mg/dL (ref 0.0–0.3)
Total Bilirubin: 0.6 mg/dL (ref 0.2–1.2)
Total Protein: 7 g/dL (ref 6.0–8.3)

## 2023-08-18 LAB — LIPID PANEL
Cholesterol: 170 mg/dL (ref 0–200)
HDL: 51.2 mg/dL (ref >39.00)
LDL Cholesterol: 93 mg/dL (ref 0–99)
NonHDL: 118.63
Total CHOL/HDL Ratio: 3
Triglycerides: 129 mg/dL (ref 0.0–149.0)
VLDL: 25.8 mg/dL (ref 0.0–40.0)

## 2023-08-18 LAB — CBC WITH DIFFERENTIAL/PLATELET
Basophils Absolute: 0 10*3/uL (ref 0.0–0.1)
Basophils Relative: 0.6 % (ref 0.0–3.0)
Eosinophils Absolute: 0.1 10*3/uL (ref 0.0–0.7)
Eosinophils Relative: 1.9 % (ref 0.0–5.0)
HCT: 37.3 % (ref 36.0–46.0)
Hemoglobin: 11.9 g/dL — ABNORMAL LOW (ref 12.0–15.0)
Lymphocytes Relative: 45.3 % (ref 12.0–46.0)
Lymphs Abs: 2.3 10*3/uL (ref 0.7–4.0)
MCHC: 31.9 g/dL (ref 30.0–36.0)
MCV: 89.9 fL (ref 78.0–100.0)
Monocytes Absolute: 0.5 10*3/uL (ref 0.1–1.0)
Monocytes Relative: 10.7 % (ref 3.0–12.0)
Neutro Abs: 2.1 10*3/uL (ref 1.4–7.7)
Neutrophils Relative %: 41.5 % — ABNORMAL LOW (ref 43.0–77.0)
Platelets: 264 10*3/uL (ref 150.0–400.0)
RBC: 4.15 Mil/uL (ref 3.87–5.11)
RDW: 15.3 % (ref 11.5–15.5)
WBC: 5.1 10*3/uL (ref 4.0–10.5)

## 2023-08-18 LAB — BASIC METABOLIC PANEL
BUN: 30 mg/dL — ABNORMAL HIGH (ref 6–23)
CO2: 24 meq/L (ref 19–32)
Calcium: 9.7 mg/dL (ref 8.4–10.5)
Chloride: 105 meq/L (ref 96–112)
Creatinine, Ser: 1.72 mg/dL — ABNORMAL HIGH (ref 0.40–1.20)
GFR: 28.4 mL/min — ABNORMAL LOW (ref >60.00)
Glucose, Bld: 113 mg/dL — ABNORMAL HIGH (ref 70–99)
Potassium: 4.1 meq/L (ref 3.5–5.1)
Sodium: 138 meq/L (ref 135–145)

## 2023-08-18 LAB — URINALYSIS, ROUTINE W REFLEX MICROSCOPIC
Bilirubin Urine: NEGATIVE
Hgb urine dipstick: NEGATIVE
Ketones, ur: NEGATIVE
Leukocytes,Ua: NEGATIVE
Nitrite: NEGATIVE
RBC / HPF: NONE SEEN (ref 0–?)
Specific Gravity, Urine: 1.02 (ref 1.000–1.030)
Urine Glucose: >=1000 — AB
Urobilinogen, UA: 0.2 (ref 0.0–1.0)
WBC, UA: NONE SEEN (ref 0–?)
pH: 6 (ref 5.0–8.0)

## 2023-08-18 LAB — HEMOGLOBIN A1C: Hgb A1c MFr Bld: 8.8 % — ABNORMAL HIGH (ref 4.6–6.5)

## 2023-08-18 LAB — LIPASE: Lipase: 54 U/L (ref 11.0–59.0)

## 2023-08-18 MED ORDER — TRAMADOL HCL 50 MG PO TABS: 50.0000 mg | ORAL_TABLET | Freq: Four times a day (QID) | ORAL | 2 refills | Status: AC | PRN

## 2023-08-18 MED ORDER — REPAGLINIDE 0.5 MG PO TABS: 0.5000 mg | ORAL_TABLET | Freq: Two times a day (BID) | ORAL | 3 refills | Status: DC

## 2023-08-18 MED ORDER — CIPROFLOXACIN HCL 500 MG PO TABS: 500.0000 mg | ORAL_TABLET | Freq: Two times a day (BID) | ORAL | 0 refills | Status: AC

## 2023-08-18 MED ORDER — METRONIDAZOLE 250 MG PO TABS: 250.0000 mg | ORAL_TABLET | Freq: Three times a day (TID) | ORAL | 0 refills | Status: AC

## 2023-08-18 NOTE — Patient Instructions (Signed)
Please take all new medication as prescribed - the tramadol for pain , as well as the 2 antibiotics  Please continue all other medications as before, and refills have been done if requested.  Please have the pharmacy call with any other refills you may need.  Please keep your appointments with your specialists as you may have planned - kidney doctor  You will be contacted regarding the referral for: Dr Marina Goodell [facility]  You will be contacted regarding the referral for: CT scan for probable acute diverticulitis  Please go to the LAB at the blood drawing area for the tests to be done  You will be contacted by phone if any changes need to be made immediately.  Otherwise, you will receive a letter about your results with an explanation, but please check with MyChart first.  Please make an Appointment to return in 6 months, or sooner if needed

## 2023-08-18 NOTE — Progress Notes (Signed)
Patient ID: Cindy Larson, female   DOB: Jul 09, 1946, 77 y.o.   MRN: [id number]        Chief Complaint: follow up hematochezia, LLQ pain, CKD4,  HTN       HPI:  Cindy Larson is a 77 y.o. female here with c/o 3 days persistent small volume (very small lately) spotting of blood on the tissue with Bms daily, with some possible burning anal sensation but mild at best, also with LLQ pain mild intermittent.  No fever, but did have mild nausea a few times but minor to her. No vomiting.  Denies worsening reflux, dysphagia, bowel change  Did have recent vaginal yeast symptoms tx per renal per pt now resolved.   Pt denies chest pain, increased sob or doe, wheezing, orthopnea, PND, increased LE swelling, palpitations, dizziness or syncope, no falls.   Pt denies polydipsia, polyuria, or new focal neuro s/s.      Last colonoscopy 2022 with 3 polyp, diverticulosis and rectosigmoid stenosis.  Also s/p TAH and probable partial oopharectomy in late 30's  Sees renal approx every 6 mo, stable recently per pt, last GFR on chart is 23.   Wt Readings from Last 3 Encounters:  08/18/23 171 lb (77.6 kg)  08/13/23 172 lb (78 kg)  05/06/23 172 lb (78 kg)   BP Readings from Last 3 Encounters:  08/18/23 120/68  05/06/23 112/63  02/16/23 124/78         Past Medical History:  Diagnosis Date   ALLERGIC RHINITIS    ANXIETY    COLONIC POLYPS, HX OF    DEPRESSION    DIABETES MELLITUS, TYPE II    Diverticulitis    DIVERTICULOSIS, COLON    Esophageal stricture    Gallstones    GERD    HIATAL HERNIA    History of blood transfusion    left knee replacement   HYPERLIPIDEMIA    Hypersomnolence    HYPERTENSION    INTERMITTENT VERTIGO    OBESITY    OSTEOARTHRITIS    L knee   OSTEOPENIA    Pneumonia    Post-menopausal    hormone replacement therapy   Past Surgical History:  Procedure Laterality Date   ABDOMINAL HYSTERECTOMY     CESAREAN SECTION     CHOLECYSTECTOMY     COLONOSCOPY  03/02/2017    Marina Goodell   Lysis of Adhesions     OOPHORECTOMY     one   POLYPECTOMY     ROTATOR CUFF REPAIR Left    TOTAL KNEE ARTHROPLASTY Left    WISDOM TOOTH EXTRACTION  07/2023    reports that she has never smoked. She has never used smokeless tobacco. She reports that she does not drink alcohol and does not use drugs. family history includes Atrial fibrillation in her mother; Breast cancer in her maternal grandmother and paternal aunt; Cancer in her mother; Colon cancer in her paternal aunt; Diabetes in an other family member; Heart disease in her mother; Heart failure in her brother; Hypertension in her mother; Kidney disease in her maternal aunt; Lung cancer in her brother; Ovarian cancer in her sister. Allergies  Allergen Reactions   Aspirin Nausea Only and Other (See Comments)    Made the stomach "hurt"   Fexofenadine Other (See Comments)    "This makes me feel badly. I didn't feel well when I took it"   Nsaids Nausea Only and Other (See Comments)    The patient CAN tolerate Tramadol and Tylenol- no Aleve or Motrin,  though   Penicillins Hives and Other (See Comments)    Blisters, also   Pioglitazone    Prevnar [Pneumococcal 13-Val Conj Vacc] Swelling and Other (See Comments)    Left arm became swollen and welts appeared   Zocor [Simvastatin] Itching   Current Outpatient Medications on File Prior to Visit  Medication Sig Dispense Refill   Accu-Chek Softclix Lancets lancets USE 1  TO CHECK GLUCOSE 4 TIMES DAILY 400 each 0   acetaminophen (TYLENOL) 500 MG tablet Take 500-1,000 mg by mouth every 8 (eight) hours as needed (for pain).     ALPRAZolam (XANAX) 0.25 MG tablet Take 1 tablet by mouth twice daily as needed for anxiety 60 tablet 5   atorvastatin (LIPITOR) 20 MG tablet Take 1 tablet by mouth once daily 90 tablet 3   Blood Glucose Monitoring Suppl (ACCU-CHEK GUIDE ME) w/Device KIT Use as directed four times per day E11.9 1 kit 0   cetirizine (ZYRTEC) 5 MG tablet Take 1 tablet (5 mg total)  by mouth daily. 30 tablet 11   Cholecalciferol (VITAMIN D3) 50 MCG (2000 UT) TABS Take 2,000 Units by mouth daily.     citalopram (CELEXA) 10 MG tablet Take 1 tablet by mouth once daily 90 tablet 0   cyclobenzaprine (FLEXERIL) 5 MG tablet Take 1 tablet (5 mg total) by mouth 3 (three) times daily as needed. 40 tablet 1   dapagliflozin propanediol (FARXIGA) 10 MG TABS tablet Take 1 tablet (10 mg total) by mouth daily before breakfast. 90 tablet 3   Fluocinolone Acetonide Body 0.01 % OIL Apply topically.     fluticasone (FLONASE) 50 MCG/ACT nasal spray Place 2 sprays into both nostrils daily. 16 g 0   gabapentin (NEURONTIN) 300 MG capsule Take 1 capsule (300 mg total) by mouth 3 (three) times daily. 270 capsule 1   glucose blood (ACCU-CHEK GUIDE) test strip USE AS INSTRUCTED FOUR TIMES DAILY. 200 each 0   lisinopril (ZESTRIL) 5 MG tablet TAKE 1 TABLET BY MOUTH ONCE DAILY. APPOINTMNET IS OVERDUE MUST SEE PROVIDER FOR FUTURE REFILLS. 90 tablet 3   meclizine (ANTIVERT) 25 MG tablet TAKE 1 TABLET BY MOUTH EVERY 6 HOURS AS NEEDED FOR DIZZINESS 30 tablet 0   mupirocin ointment (BACTROBAN) 2 % Apply 1 Application topically 2 (two) times daily. To affected area till better 22 g 0   omeprazole (PRILOSEC) 20 MG capsule TAKE 1 CAPSULE BY MOUTH TWICE DAILY BEFORE MEAL(S) 180 capsule 2   PARoxetine (PAXIL) 20 MG tablet Take 1 tablet (20 mg total) by mouth daily. 90 tablet 3   Polyvinyl Alcohol-Povidone (REFRESH OP) Apply to eye.     promethazine (PHENERGAN) 25 MG tablet TAKE 1 TABLET BY MOUTH EVERY 6 HOURS AS NEEDED FOR NAUSEA 40 tablet 0   sitaGLIPtin (JANUVIA) 25 MG tablet Take 1 tablet (25 mg total) by mouth daily. 90 tablet 3   triamcinolone (NASACORT) 55 MCG/ACT AERO nasal inhaler Place 2 sprays into the nose daily. (Patient taking differently: Place 2 sprays into the nose daily as needed (for allergies or sinus issues).) 1 Inhaler 12   triamcinolone cream (KENALOG) 0.1 % APPLY 1 APPLICATION TOPICALLY  TWICE  DAILY 30 g 0   No current facility-administered medications on file prior to visit.        ROS:  All others reviewed and negative.  Objective        PE:  BP 120/68 (BP Location: Right Arm, Patient Position: Sitting, Cuff Size: Normal)   Pulse 93  Temp 98.9 F (37.2 C) (Oral)   Ht 4\' 10"  (1.473 m)   Wt 171 lb (77.6 kg)   SpO2 99%   BMI 35.74 kg/m                 Constitutional: Pt appears in NAD               HENT: Head: NCAT.                Right Ear: External ear normal.                 Left Ear: External ear normal.                Eyes: . Pupils are equal, round, and reactive to light. Conjunctivae and EOM are normal               Nose: without d/c or deformity               Neck: Neck supple. Gross normal ROM               Cardiovascular: Normal rate and regular rhythm.                 Pulmonary/Chest: Effort normal and breath sounds without rales or wheezing.                Abd:  Soft, mod tender LLQ, no guarding or rebound, ND, + BS, no organomegaly               Neurological: Pt is alert. At baseline orientation, motor grossly intact               Skin: Skin is warm. No rashes, no other new lesions, LE edema - none               Psychiatric: Pt behavior is normal without agitation   Micro: none  Cardiac tracings I have personally interpreted today:  none  Pertinent Radiological findings (summarize): none   Lab Results  Component Value Date   WBC 5.1 08/18/2023   HGB 11.9 (L) 08/18/2023   HCT 37.3 08/18/2023   PLT 264.0 08/18/2023   GLUCOSE 113 (H) 08/18/2023   CHOL 170 08/18/2023   TRIG 129.0 08/18/2023   HDL 51.20 08/18/2023   LDLDIRECT 83.0 04/21/2021   LDLCALC 93 08/18/2023   ALT 10 08/18/2023   AST 15 08/18/2023   NA 138 08/18/2023   K 4.1 08/18/2023   CL 105 08/18/2023   CREATININE 1.72 (H) 08/18/2023   BUN 30 (H) 08/18/2023   CO2 24 08/18/2023   TSH 4.17 11/12/2021   INR 0.9 12/14/2020   HGBA1C 8.8 (H) 08/18/2023   MICROALBUR 2.1 (H)  11/12/2021   Assessment/Plan:  Cindy Larson is a 77 y.o. Black or African American [2] female with  has a past medical history of ALLERGIC RHINITIS, ANXIETY, COLONIC POLYPS, HX OF, DEPRESSION, DIABETES MELLITUS, TYPE II, Diverticulitis, DIVERTICULOSIS, COLON, Esophageal stricture, Gallstones, GERD, HIATAL HERNIA, History of blood transfusion, HYPERLIPIDEMIA, Hypersomnolence, HYPERTENSION, INTERMITTENT VERTIGO, OBESITY, OSTEOARTHRITIS, OSTEOPENIA, Pneumonia, and Post-menopausal.  Type 2 diabetes mellitus with hyperlipidemia (HCC) Lab Results  Component Value Date   HGBA1C 8.8 (H) 08/18/2023   uncontrolled, pt to cont farxiga 10 every day, januvia 25 every day, and add prandin 0.5 bid    Essential hypertension BP Readings from Last 3 Encounters:  08/18/23 120/68  05/06/23 112/63  02/16/23 124/78   Stable, pt to continue medical treatment lisinopril  5 qd   CKD (chronic kidney disease) Lab Results  Component Value Date   CREATININE 1.72 (H) 08/18/2023   Stable overall, cont to avoid nephrotoxins   LLQ pain Etiology unclear but high suspicion for acute diverticulitis - for CT (no cm due to CKD), lab includiing  UA, also empiric cipro 500 bid, flagyl 250 tid  Hematochezia Small volume, for GI referral, likely related to LLQ pain, for cbc with labs  Followup: Return in about 6 months (around 02/16/2024).  Oliver Barre, MD 08/21/2023 3:56 PM [facility]

## 2023-08-19 ENCOUNTER — Other Ambulatory Visit: Payer: Self-pay

## 2023-08-19 ENCOUNTER — Other Ambulatory Visit: Payer: Self-pay | Admitting: Internal Medicine

## 2023-08-19 LAB — URINE CULTURE: Result:: NO GROWTH

## 2023-08-19 NOTE — Telephone Encounter (Signed)
Copied from CRM [id number]. Topic: Clinical - Medication Refill >> Aug 19, 2023  3:00 PM Pascal Lux wrote: Most Recent Primary Care Visit:  Provider: Corwin Levins  Department: [facility] [place]  Visit Type: OFFICE VISIT  Date: 08/18/2023  Medication: clopidogrel (PLAVIX) 75 MG tablet [[id number]]  Has the patient contacted their pharmacy? Yes (Agent: If no, request that the patient contact the pharmacy for the refill. If patient does not wish to contact the pharmacy document the reason why and proceed with request.) (Agent: If yes, when and what did the pharmacy advise?)  Is this the correct pharmacy for this prescription? Yes If no, delete pharmacy and type the correct one.  This is the patient's preferred pharmacy:  [id number] - [place] ([place]), [place] - [address] [place] ([place]) [place] [address] Phone: [phone] Fax: [phone]  [id number] - [place], [place] - [address] AT [facility] [address] [place] [address] Phone: [phone] Fax: [phone]   Has the prescription been filled recently? Yes  Is the patient out of the medication? No  Has the patient been seen for an appointment in the last year OR does the patient have an upcoming appointment? Yes  Can we respond through MyChart? Yes  Agent: Please be advised that Rx refills may take up to 3 business days. We ask that you follow-up with your pharmacy.

## 2023-08-19 NOTE — Telephone Encounter (Signed)
Copied from CRM [id number]. Topic: Clinical - Medication Refill >> Aug 19, 2023  3:34 PM Pascal Lux wrote: Most Recent Primary Care Visit:  Provider: Corwin Levins  Department: [facility] [place]  Visit Type: OFFICE VISIT  Date: 08/18/2023  Medication: clopidogrel (PLAVIX) 75 MG tablet [[id number]]  Has the patient contacted their pharmacy? Yes (Agent: If no, request that the patient contact the pharmacy for the refill. If patient does not wish to contact the pharmacy document the reason why and proceed with request.) (Agent: If yes, when and what did the pharmacy advise?)  Is this the correct pharmacy for this prescription? Yes If no, delete pharmacy and type the correct one.  This is the patient's preferred pharmacy:  [id number] - [place] ([place]), [place] - [address] [place] ([place]) [place] [address] Phone: [phone] Fax: [phone]  [id number] - [place], [place] - [address] AT [facility] [address] [place] [address] Phone: [phone] Fax: [phone]   Has the prescription been filled recently? Yes  Is the patient out of the medication? No, almost.  Has the patient been seen for an appointment in the last year OR does the patient have an upcoming appointment? Yes  Can we respond through MyChart? Yes  Agent: Please be advised that Rx refills may take up to 3 business days. We ask that you follow-up with your pharmacy.

## 2023-08-20 MED ORDER — CLOPIDOGREL BISULFATE 75 MG PO TABS: 75.0000 mg | ORAL_TABLET | Freq: Every day | ORAL | 0 refills | Status: DC

## 2023-08-21 ENCOUNTER — Encounter: Payer: Self-pay | Admitting: Internal Medicine

## 2023-08-21 NOTE — Assessment & Plan Note (Signed)
Lab Results  Component Value Date   CREATININE 1.72 (H) 08/18/2023   Stable overall, cont to avoid nephrotoxins

## 2023-08-21 NOTE — Assessment & Plan Note (Addendum)
Etiology unclear but high suspicion for acute diverticulitis - for CT (no cm due to CKD), lab includiing  UA, also empiric cipro 500 bid, flagyl 250 tid

## 2023-08-21 NOTE — Assessment & Plan Note (Signed)
BP Readings from Last 3 Encounters:  08/18/23 120/68  05/06/23 112/63  02/16/23 124/78   Stable, pt to continue medical treatment lisinopril 5 qd

## 2023-08-21 NOTE — Assessment & Plan Note (Signed)
Small volume, for GI referral, likely related to LLQ pain, for cbc with labs

## 2023-08-21 NOTE — Assessment & Plan Note (Signed)
Lab Results  Component Value Date   HGBA1C 8.8 (H) 08/18/2023   uncontrolled, pt to cont farxiga 10 every day, januvia 25 every day, and add prandin 0.5 bid

## 2023-08-23 ENCOUNTER — Telehealth: Payer: Self-pay | Admitting: Internal Medicine

## 2023-08-23 ENCOUNTER — Ambulatory Visit
Admission: RE | Admit: 2023-08-23 | Discharge: 2023-08-23 | Disposition: A | Discharge status: Home / Self Care | Payer: Medicare Other | Source: Ambulatory Visit | Attending: Internal Medicine | Admitting: Internal Medicine

## 2023-08-23 DIAGNOSIS — K921 Melena: Secondary | ICD-10-CM

## 2023-08-23 DIAGNOSIS — E1169 Type 2 diabetes mellitus with other specified complication: Secondary | ICD-10-CM

## 2023-08-23 DIAGNOSIS — R1032 Left lower quadrant pain: Secondary | ICD-10-CM | POA: Diagnosis not present

## 2023-08-23 DIAGNOSIS — N184 Chronic kidney disease, stage 4 (severe): Secondary | ICD-10-CM

## 2023-08-23 DIAGNOSIS — N2889 Other specified disorders of kidney and ureter: Secondary | ICD-10-CM

## 2023-08-23 DIAGNOSIS — K5730 Diverticulosis of large intestine without perforation or abscess without bleeding: Secondary | ICD-10-CM | POA: Diagnosis not present

## 2023-08-23 NOTE — Telephone Encounter (Signed)
Refill sent on 08/20/2023

## 2023-08-26 ENCOUNTER — Inpatient Hospital Stay
Admission: RE | Admit: 2023-08-26 | Discharge: 2023-08-26 | Discharge status: Home / Self Care | Payer: Medicare Other | Source: Ambulatory Visit | Attending: Internal Medicine | Admitting: Internal Medicine

## 2023-08-26 DIAGNOSIS — N2889 Other specified disorders of kidney and ureter: Secondary | ICD-10-CM

## 2023-08-27 ENCOUNTER — Other Ambulatory Visit: Payer: Self-pay | Admitting: Internal Medicine

## 2023-08-27 DIAGNOSIS — N2889 Other specified disorders of kidney and ureter: Secondary | ICD-10-CM

## 2023-09-02 DIAGNOSIS — H04123 Dry eye syndrome of bilateral lacrimal glands: Secondary | ICD-10-CM | POA: Diagnosis not present

## 2023-09-02 DIAGNOSIS — H402234 Chronic angle-closure glaucoma, bilateral, indeterminate stage: Secondary | ICD-10-CM | POA: Diagnosis not present

## 2023-09-02 DIAGNOSIS — H25813 Combined forms of age-related cataract, bilateral: Secondary | ICD-10-CM | POA: Diagnosis not present

## 2023-09-02 NOTE — Addendum Note (Signed)
 Addended by: Corwin Levins on: 09/02/2023 12:21 PM   Modules accepted: Orders

## 2023-09-02 NOTE — Telephone Encounter (Signed)
 Ok this is done, thanks

## 2023-09-16 ENCOUNTER — Ambulatory Visit: Payer: Self-pay | Admitting: Internal Medicine

## 2023-09-16 NOTE — Telephone Encounter (Signed)
Chief Complaint: Vaginal symptoms Symptoms: Vaginal irritation, burning, stinging, itching, and painful urination, odor, feel feverish at times Frequency: Since last week Pertinent Negatives: Patient denies discharge Disposition: [] ED /[] Urgent Care (no appt availability in office) / [x] Appointment(In office/virtual)/ []  [facility]/ [] Home Care/ [] Refused Recommended Disposition /[] [facility]/ []  Follow-up with PCP  Additional Notes: Patient stated she is having vaginal symptoms. She stated that she believes she has a yeast infection and she said that she was treated for the same symptoms in December. Patient reported vaginal itching, vaginal pain, burning, stinging, and burning with urination. Patient stated she is very uncomfortable. No appointments available this week at regular location. Offered to schedule appointment tomorrow at different location and patient declined. Informed patient that a message is being sent and the office will follow up with her.   Copied from CRM [id number]. Topic: Clinical - Medication Refill >> Sep 16, 2023  3:48 PM Drema Balzarine wrote: Most Recent Primary Care Visit:  Provider: Corwin Levins  Department: [facility] [place]  Visit Type: OFFICE VISIT  Date: 08/18/2023  Medication: Med for yeast infection  Has the patient contacted their pharmacy? No (Agent: If no, request that the patient contact the pharmacy for the refill. If patient does not wish to contact the pharmacy document the reason why and proceed with request.) (Agent: If yes, when and what did the pharmacy advise?)  Is this the correct pharmacy for this prescription? Yes If no, delete pharmacy and type the correct one.  This is the patient's preferred pharmacy:   [id number] - [place] ([place]), [place] - [address]  [place]) [place] [address] Phone: [phone] Fax: [phone]    Has the prescription been filled recently?  Yes  Is the patient out of the medication? No  Has the patient been seen for an appointment in the last year OR does the patient have an upcoming appointment? Yes  Can we respond through MyChart? Yes  Agent: Please be advised that Rx refills may take up to 3 business days. We ask that you follow-up with your pharmacy. Reason for Disposition  MODERATE-SEVERE itching (i.e., interferes with school, work, or sleep)  Answer Assessment - Initial Assessment Questions 1. SYMPTOM: "What's the main symptom you're concerned about?" (e.g., pain, itching, dryness)     Irritation, itching, burning, stinging 2. LOCATION: "Where is the  *No Answer* located?" (e.g., inside/outside, left/right)     Both internal and external 3. ONSET: "When did the  *No Answer*  start?"     Last week  4. PAIN: "Is there any pain?" If Yes, ask: "How bad is it?" (Scale: 1-10; mild, moderate, severe)   -  MILD (1-3): Doesn't interfere with normal activities.    -  MODERATE (4-7): Interferes with normal activities (e.g., work or school) or awakens from sleep.     -  SEVERE (8-10): Excruciating pain, unable to do any normal activities.     Hurts  5. ITCHING: "Is there any itching?" If Yes, ask: "How bad is it?" (Scale: 1-10; mild, moderate, severe)     Yes,  6. CAUSE: "What do you think is causing the discharge?" "Have you had the same problem before? What happened then?"     *No Answer* 7. OTHER SYMPTOMS: "Do you have any other symptoms?" (e.g., fever, itching, vaginal bleeding, pain with urination, injury to genital area, vaginal foreign body)     Pain when when urination, no discharge  Protocols used:  Vaginal Symptoms-A-AH

## 2023-09-17 NOTE — Telephone Encounter (Signed)
Likely needs OV.  I usually do not do these exams, and can be another provider in the office, or I can refer to GYN, or UC

## 2023-09-20 ENCOUNTER — Ambulatory Visit (INDEPENDENT_AMBULATORY_CARE_PROVIDER_SITE_OTHER): Payer: Medicare Other | Admitting: Nurse Practitioner

## 2023-09-20 VITALS — BP 114/64 | HR 93 | Temp 98.5°F | Ht <= 58 in | Wt 172.5 lb

## 2023-09-20 DIAGNOSIS — N898 Other specified noninflammatory disorders of vagina: Secondary | ICD-10-CM

## 2023-09-20 NOTE — Progress Notes (Signed)
Established Patient Office Visit  Subjective   Patient ID: Cindy Larson, female    DOB: 21-Aug-1946  Age: 78 y.o. MRN: [id number]  Chief Complaint  Patient presents with   Vaginal Discharge    Discharge and itchiness and burning, on and off issue since last year     Patient has today for the above.  She reports that she has had vaginal discharge and yeast infections off and on since October 2024.  She sees nephrology for CKD stage IV and has been treated with Diflucan as needed for yeast infections.  She reports that she has had 3 previous infections before today.  She is married and denies any new sexual partners.  She does have associated type 2 diabetes with last A1c 8.8.  She is on [place] but reports that this has been a chronic medication for well over a year.  She does endorse eating potato chips fairly regularly. Her husband had some leftover Bactrim and she has been taking this with some improvement in her symptoms.    ROS: see HPI    Objective:     BP 114/64   Pulse 93   Temp 98.5 F (36.9 C) (Temporal)   Ht 4\' 10"  (1.473 m)   Wt 172 lb 8 oz (78.2 kg)   SpO2 99%   BMI 36.05 kg/m    Physical Exam Vitals reviewed.  Constitutional:      General: She is not in acute distress.    Appearance: Normal appearance.  HENT:     Head: Normocephalic and atraumatic.  Neck:     Vascular: No carotid bruit.  Cardiovascular:     Rate and Rhythm: Normal rate and regular rhythm.     Pulses: Normal pulses.     Heart sounds: Normal heart sounds.  Pulmonary:     Effort: Pulmonary effort is normal.     Breath sounds: Normal breath sounds.  Skin:    General: Skin is warm and dry.  Neurological:     General: No focal deficit present.     Mental Status: She is alert and oriented to person, place, and time.  Psychiatric:        Mood and Affect: Mood normal.        Behavior: Behavior normal.        Judgment: Judgment normal.      No results found for any visits  on 09/20/23.    The 10-year ASCVD risk score (Arnett DK, et al., 2019) is: 26.8%    Assessment & Plan:   Problem List Items Addressed This Visit       Other   Vaginal discharge - Primary   Acute Will check urinalysis, urine culture, and new swab for further evaluation. Patient was told to stop her husbands Bactrim as specific dosing changes need to be made for her level of kidney dysfunction.  She reports her understanding. We discussed mechanism action with Marcelline Deist and that she really does need to be very adherent to a diabetic diet as increased sugar and carbohydrate intake will increase sugar content in urine which can further increase risk of UTIs or vaginal yeast infection development.  She was encouraged to discuss this further with nephrology when she sees them again next time. Vaginal exam not conducted today as this is not an exam that I do routinely and therefore I feel is outside of my scope of practice.  Pending lab results and whether or not she continues to have recurrent symptoms may  recommend referral to OB/GYN for evaluation. She reports her understanding. Otherwise, further recommendations will be made based upon testing results.       Relevant Orders   Urine Culture   Urinalysis, Routine w reflex microscopic   NuSwab Vaginitis Plus (VG+)    Return if symptoms worsen or fail to improve.    Elenore Paddy, NP

## 2023-09-20 NOTE — Assessment & Plan Note (Addendum)
Acute Will check urinalysis, urine culture, and new swab for further evaluation. Patient was told to stop her husbands Bactrim as specific dosing changes need to be made for her level of kidney dysfunction.  She reports her understanding. We discussed mechanism action with Marcelline Deist and that she really does need to be very adherent to a diabetic diet as increased sugar and carbohydrate intake will increase sugar content in urine which can further increase risk of UTIs or vaginal yeast infection development.  She was encouraged to discuss this further with nephrology when she sees them again next time. Vaginal exam not conducted today as this is not an exam that I do routinely and therefore I feel is outside of my scope of practice.  Pending lab results and whether or not she continues to have recurrent symptoms may recommend referral to OB/GYN for evaluation. She reports her understanding. Otherwise, further recommendations will be made based upon testing results.

## 2023-09-20 NOTE — Telephone Encounter (Signed)
Called an got Pt scheduled with another provider.

## 2023-09-21 LAB — URINALYSIS, ROUTINE W REFLEX MICROSCOPIC
Hgb urine dipstick: NEGATIVE
Ketones, ur: NEGATIVE
Leukocytes,Ua: NEGATIVE
Nitrite: NEGATIVE
Specific Gravity, Urine: 1.025 (ref 1.000–1.030)
Total Protein, Urine: 30 — AB
Urine Glucose: >=1000 — AB
Urobilinogen, UA: 0.2 (ref 0.0–1.0)
pH: 6 (ref 5.0–8.0)

## 2023-09-21 LAB — URINE CULTURE: Result:: NO GROWTH

## 2023-09-22 ENCOUNTER — Encounter: Payer: Self-pay | Admitting: Nurse Practitioner

## 2023-09-24 ENCOUNTER — Other Ambulatory Visit: Payer: Self-pay | Admitting: Nurse Practitioner

## 2023-09-24 ENCOUNTER — Encounter: Payer: Self-pay | Admitting: Nurse Practitioner

## 2023-09-24 DIAGNOSIS — B3731 Acute candidiasis of vulva and vagina: Secondary | ICD-10-CM

## 2023-09-24 LAB — NUSWAB VAGINITIS PLUS (VG+)
Candida albicans, NAA: POSITIVE — AB
Candida glabrata, NAA: NEGATIVE
Chlamydia trachomatis, NAA: NEGATIVE
Neisseria gonorrhoeae, NAA: NEGATIVE
Trich vag by NAA: NEGATIVE

## 2023-09-24 MED ORDER — FLUCONAZOLE 150 MG PO TABS: 150.0000 mg | ORAL_TABLET | Freq: Every day | ORAL | 0 refills | Status: DC

## 2023-10-16 ENCOUNTER — Other Ambulatory Visit: Payer: Self-pay | Admitting: Internal Medicine

## 2023-10-16 ENCOUNTER — Other Ambulatory Visit: Payer: Self-pay | Admitting: Nurse Practitioner

## 2023-10-16 DIAGNOSIS — B3731 Acute candidiasis of vulva and vagina: Secondary | ICD-10-CM

## 2023-10-18 ENCOUNTER — Other Ambulatory Visit: Payer: Self-pay | Admitting: Internal Medicine

## 2023-10-18 ENCOUNTER — Other Ambulatory Visit: Payer: Self-pay

## 2023-10-19 ENCOUNTER — Other Ambulatory Visit: Payer: Self-pay | Admitting: Nurse Practitioner

## 2023-10-19 ENCOUNTER — Other Ambulatory Visit: Payer: Self-pay | Admitting: Internal Medicine

## 2023-10-19 ENCOUNTER — Ambulatory Visit: Payer: Self-pay | Admitting: Internal Medicine

## 2023-10-19 DIAGNOSIS — B3731 Acute candidiasis of vulva and vagina: Secondary | ICD-10-CM

## 2023-10-19 NOTE — Telephone Encounter (Signed)
  Chief Complaint: vaginal itching Symptoms: itching, burning Frequency: 5 days Pertinent Negatives: Patient denies discharge, fever, blood in urine Disposition: [] ED /[] Urgent Care (no appt availability in office) / [x] Appointment(In office/virtual)/ []  [facility]/ [] Home Care/ [x] Refused Recommended Disposition /[] [facility]/ []  Follow-up with PCP Additional Notes: Patient calls reporting symptoms of a yeast infection x 5 days. Patient states she frequently has them due to the farxiga she takes. Per protocol, patient to be evaluated within 24 hours. Declines appt scheduling or virtual visit. After chart review it appears medication may have been sent to pharmacy yesterday. Care advice reviewed, patient verbalized understandingand denies further questions at this time. Alerting PCP for review.    Copied from CRM [id number]. Topic: Clinical - Red Word Triage >> Oct 19, 2023  2:07 PM Sim Boast F wrote: Red Word that prompted transfer to Nurse Triage: Patient has been having vaginal itching and burning for almost a week now, would like a medication for possible yeast infection Reason for Disposition  MODERATE-SEVERE itching (i.e., interferes with school, work, or sleep)  Answer Assessment - Initial Assessment Questions 1. SYMPTOM: "What's the main symptom you're concerned about?" (e.g., pain, itching, dryness)     Itching, burning 2. LOCATION: "Where is the  located?" (e.g., inside/outside, left/right)     Outside 3. ONSET: "When did the  symptoms  start?"     5 days 4. PAIN: "Is there any pain?" If Yes, ask: "How bad is it?" (Scale: 1-10; mild, moderate, severe)   -  MILD (1-3): Doesn't interfere with normal activities.    -  MODERATE (4-7): Interferes with normal activities (e.g., work or school) or awakens from sleep.     -  SEVERE (8-10): Excruciating pain, unable to do any normal activities.     3/10 5. ITCHING: "Is there any itching?" If Yes, ask: "How bad is it?"  (Scale: 1-10; mild, moderate, severe)     Moderate 6. CAUSE: "What do you think is causing the discharge?" "Have you had the same problem before? What happened then?"     Yeast infection, hx of same 7. OTHER SYMPTOMS: "Do you have any other symptoms?" (e.g., fever, itching, vaginal bleeding, pain with urination, injury to genital area, vaginal foreign body)     Denies  Protocols used: Vaginal Symptoms-A-AH

## 2023-10-20 DIAGNOSIS — D4102 Neoplasm of uncertain behavior of left kidney: Secondary | ICD-10-CM | POA: Diagnosis not present

## 2023-10-21 ENCOUNTER — Encounter: Payer: Self-pay | Admitting: Gastroenterology

## 2023-11-08 ENCOUNTER — Other Ambulatory Visit: Payer: Self-pay | Admitting: Internal Medicine

## 2023-11-08 DIAGNOSIS — B3731 Acute candidiasis of vulva and vagina: Secondary | ICD-10-CM

## 2023-11-10 ENCOUNTER — Encounter: Payer: Self-pay | Admitting: Gastroenterology

## 2023-11-10 ENCOUNTER — Ambulatory Visit: Payer: Medicare Other | Admitting: Gastroenterology

## 2023-11-10 VITALS — BP 96/60 | HR 76 | Ht 59.0 in | Wt 171.0 lb

## 2023-11-10 DIAGNOSIS — K589 Irritable bowel syndrome without diarrhea: Secondary | ICD-10-CM

## 2023-11-10 DIAGNOSIS — Z860100 Personal history of colon polyps, unspecified: Secondary | ICD-10-CM

## 2023-11-10 DIAGNOSIS — R1032 Left lower quadrant pain: Secondary | ICD-10-CM | POA: Diagnosis not present

## 2023-11-10 DIAGNOSIS — K219 Gastro-esophageal reflux disease without esophagitis: Secondary | ICD-10-CM | POA: Diagnosis not present

## 2023-11-10 NOTE — Progress Notes (Signed)
 Chief Complaint: Chronic LLQ pain Primary GI MD: Dr. Marina Goodell  HPI: 78 year old female history of stroke/TIA (on Plavix), type 2 diabetes, hyperlipidemia, hypertension, presents for evaluation of chronic LLQ pain.  Patient was last seen in 2019 by Willette Cluster, NP and at that time she had chronic LLQ biotics and a negative CT.  She also underwent colonoscopy in 2022 which showed polyp, diverticulosis, otherwise unrevealing.  Recently seen by PCP December 2020 for with acute on chronic LLQ pain and underwent CT abdomen pelvis without contrast which was negative for diverticulitis.  She was given antibiotics but this only provided temporary relief of her pain and then it continued.  ------------TODAY----------------  The patient presents with chronic left lower abdominal pain. She is accompanied by her daughter.   She has experienced chronic left lower abdominal pain for several years, with significant worsening in December. The pain is relieved by bowel movements and occurs daily, especially in the morning.   No constipation, although she occasionally feels constipated every three to four months. She has regular bowel movements, typically two in the morning, which are of good size.  She states her diet is high in FODMAPs.  She experiences queasiness upon waking but no nausea or vomiting. She takes omeprazole, which effectively manages any acid or heartburn symptoms.      PREVIOUS GI WORKUP   Colonoscopy 10/2020 - Three 1 to 3 mm polyps in the ascending colon and in the cecum, removed with a cold snare. Resected and retrieved.  - Diverticulosis in the sigmoid colon and in the right colon. 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 - repeat 5 years  ECHO 50-55%  Past Medical History:  Diagnosis Date   ALLERGIC RHINITIS    ANXIETY    COLONIC POLYPS, HX OF    DEPRESSION    DIABETES MELLITUS, TYPE II    Diverticulitis    DIVERTICULOSIS, COLON    Esophageal stricture     Gallstones    GERD    HIATAL HERNIA    History of blood transfusion    left knee replacement   HYPERLIPIDEMIA    Hypersomnolence    HYPERTENSION    INTERMITTENT VERTIGO    OBESITY    OSTEOARTHRITIS    L knee   OSTEOPENIA    Pneumonia    Post-menopausal    hormone replacement therapy    Past Surgical History:  Procedure Laterality Date   ABDOMINAL HYSTERECTOMY     CESAREAN SECTION     CHOLECYSTECTOMY     COLONOSCOPY  03/02/2017   Marina Goodell   Lysis of Adhesions     OOPHORECTOMY     one   POLYPECTOMY     ROTATOR CUFF REPAIR Left    TOTAL KNEE ARTHROPLASTY Left    WISDOM TOOTH EXTRACTION  07/2023    Current Outpatient Medications  Medication Sig Dispense Refill   Accu-Chek Softclix Lancets lancets USE 1  TO CHECK GLUCOSE 4 TIMES DAILY 400 each 0   acetaminophen (TYLENOL) 500 MG tablet Take 500-1,000 mg by mouth every 8 (eight) hours as needed (for pain).     ALPRAZolam (XANAX) 0.25 MG tablet Take 1 tablet by mouth twice daily as needed for anxiety 60 tablet 5   atorvastatin (LIPITOR) 20 MG tablet Take 1 tablet by mouth once daily 90 tablet 3   Blood Glucose Monitoring Suppl (ACCU-CHEK GUIDE ME) w/Device KIT Use as directed four times per day E11.9 1 kit 0   cetirizine (ZYRTEC) 5 MG tablet Take 1 tablet (5  mg total) by mouth daily. 30 tablet 11   Cholecalciferol (VITAMIN D3) 50 MCG (2000 UT) TABS Take 2,000 Units by mouth daily.     citalopram (CELEXA) 10 MG tablet Take 1 tablet by mouth once daily 90 tablet 0   clopidogrel (PLAVIX) 75 MG tablet Take 1 tablet (75 mg total) by mouth daily. 90 tablet 0   cyclobenzaprine (FLEXERIL) 5 MG tablet Take 1 tablet (5 mg total) by mouth 3 (three) times daily as needed. 40 tablet 1   dapagliflozin propanediol (FARXIGA) 10 MG TABS tablet Take 1 tablet (10 mg total) by mouth daily before breakfast. 90 tablet 3   fluconazole (DIFLUCAN) 150 MG tablet Take 1 tablet by mouth once daily 1 tablet 0   Fluocinolone Acetonide Body 0.01 % OIL Apply  topically.     fluticasone (FLONASE) 50 MCG/ACT nasal spray Place 2 sprays into both nostrils daily. 16 g 0   gabapentin (NEURONTIN) 300 MG capsule Take 1 capsule (300 mg total) by mouth 3 (three) times daily. 270 capsule 1   glucose blood (ACCU-CHEK GUIDE) test strip USE AS INSTRUCTED FOUR TIMES DAILY. 200 each 0   lisinopril (ZESTRIL) 5 MG tablet TAKE 1 TABLET BY MOUTH ONCE DAILY. APPOINTMNET IS OVERDUE MUST SEE PROVIDER FOR FUTURE REFILLS. 90 tablet 3   meclizine (ANTIVERT) 25 MG tablet TAKE 1 TABLET BY MOUTH EVERY 6 HOURS AS NEEDED FOR DIZZINESS 30 tablet 0   mupirocin ointment (BACTROBAN) 2 % Apply 1 Application topically 2 (two) times daily. To affected area till better 22 g 0   omeprazole (PRILOSEC) 20 MG capsule TAKE 1 CAPSULE BY MOUTH TWICE DAILY BEFORE MEAL(S) 180 capsule 2   PARoxetine (PAXIL) 20 MG tablet Take 1 tablet (20 mg total) by mouth daily. 90 tablet 3   Polyvinyl Alcohol-Povidone (REFRESH OP) Apply to eye.     promethazine (PHENERGAN) 25 MG tablet TAKE 1 TABLET BY MOUTH EVERY 6 HOURS AS NEEDED FOR NAUSEA 40 tablet 0   repaglinide (PRANDIN) 0.5 MG tablet Take 1 tablet (0.5 mg total) by mouth 2 (two) times daily before a meal. 180 tablet 3   sitaGLIPtin (JANUVIA) 25 MG tablet Take 1 tablet (25 mg total) by mouth daily. 90 tablet 3   traMADol (ULTRAM) 50 MG tablet Take 1 tablet (50 mg total) by mouth every 6 (six) hours as needed. 60 tablet 2   triamcinolone (NASACORT) 55 MCG/ACT AERO nasal inhaler Place 2 sprays into the nose daily. (Patient taking differently: Place 2 sprays into the nose daily as needed (for allergies or sinus issues).) 1 Inhaler 12   triamcinolone cream (KENALOG) 0.1 % APPLY 1 APPLICATION TOPICALLY  TWICE DAILY 30 g 0   No current facility-administered medications for this visit.    Allergies as of 11/10/2023 - Review Complete 11/10/2023  Allergen Reaction Noted   Aspirin Nausea Only and Other (See Comments) 07/03/2007   Fexofenadine Other (See Comments)  12/13/2020   Nsaids Nausea Only and Other (See Comments) 12/13/2020   Penicillins Hives and Other (See Comments) 07/03/2007   Pioglitazone  02/16/2023   Prevnar [pneumococcal 13-val conj vacc] Swelling and Other (See Comments) 12/14/2013   Zocor [simvastatin] Itching 03/14/2010    Family History  Problem Relation Age of Onset   Hypertension Mother    Atrial fibrillation Mother    Heart disease Mother        CHF   Cancer Mother        Cancer in "thigh"   Ovarian cancer Sister  Lung cancer Brother        smoker   Heart failure Brother        CHF   Breast cancer Maternal Grandmother    Kidney disease Maternal Aunt    Colon cancer Paternal Aunt        originated as breast CA   Breast cancer Paternal Aunt    Diabetes Other        paternal Aunt and uncle   Colon cancer Maternal Uncle    Esophageal cancer Neg Hx    Rectal cancer Neg Hx    Stomach cancer Neg Hx    Colon polyps Neg Hx     Social History   Socioeconomic History   Marital status: Married    Spouse name: Tilmon   Number of children: 3   Years of education: Not on file   Highest education level: 12th grade  Occupational History   Occupation: Naval architect  Tobacco Use   Smoking status: Never   Smokeless tobacco: Never  Vaping Use   Vaping status: Never Used  Substance and Sexual Activity   Alcohol use: No   Drug use: No   Sexual activity: Not on file  Other Topics Concern   Not on file  Social History Narrative   Lives with husband   Social Drivers of Health   Financial Resource Strain: Low Risk  (08/17/2023)   Overall Financial Resource Strain (CARDIA)    Difficulty of Paying Living Expenses: Not very hard  Food Insecurity: Food Insecurity Present (08/17/2023)   Hunger Vital Sign    Worried About Running Out of Food in the Last Year: Sometimes true    Ran Out of Food in the Last Year: Never true  Transportation Needs: No Transportation Needs (08/17/2023)   PRAPARE - Scientist, research (physical sciences) (Medical): No    Lack of Transportation (Non-Medical): No  Physical Activity: Inactive (08/17/2023)   Exercise Vital Sign    Days of Exercise per Week: 0 days    Minutes of Exercise per Session: 20 min  Stress: No Stress Concern Present (08/17/2023)   [employer] of Occupational Health - Occupational Stress Questionnaire    Feeling of Stress : Not at all  Social Connections: Moderately Integrated (08/17/2023)   Social Connection and Isolation Panel [NHANES]    Frequency of Communication with Friends and Family: More than three times a week    Frequency of Social Gatherings with Friends and Family: Twice a week    Attends Religious Services: 1 to 4 times per year    Active Member of [employer] or Organizations: No    Attends Banker Meetings: Never    Marital Status: Married  Catering manager Violence: Not At Risk (08/13/2023)   Humiliation, Afraid, Rape, and Kick questionnaire    Fear of Current or Ex-Partner: No    Emotionally Abused: No    Physically Abused: No    Sexually Abused: No    Review of Systems:    Constitutional: No weight loss, fever, chills, weakness or fatigue HEENT: Eyes: No change in vision               Ears, Nose, Throat:  No change in hearing or congestion Skin: No rash or itching Cardiovascular: No chest pain, chest pressure or palpitations   Respiratory: No SOB or cough Gastrointestinal: See HPI and otherwise negative Genitourinary: No dysuria or change in urinary frequency Neurological: No headache, dizziness or syncope Musculoskeletal: No new muscle or  joint pain Hematologic: No bleeding or bruising Psychiatric: No history of depression or anxiety    Physical Exam:  Vital signs: BP 96/60 (BP Location: Left Arm, Patient Position: Sitting, Cuff Size: Large)   Pulse 76   Ht 4\' 11"  (1.499 m)   Wt 171 lb (77.6 kg)   BMI 34.54 kg/m   Constitutional: NAD, Well developed, Well nourished, alert and cooperative Head:   Normocephalic and atraumatic. Eyes:   PEERL, EOMI. No icterus. Conjunctiva pink. Respiratory: Respirations even and unlabored. Lungs clear to auscultation bilaterally.   No wheezes, crackles, or rhonchi.  Cardiovascular:  Regular rate and rhythm. No peripheral edema, cyanosis or pallor.  Gastrointestinal:  Soft, nondistended, mild LLQ tenderness. No rebound or guarding. Normal bowel sounds. No appreciable masses or hepatomegaly. Rectal:  Not performed.  Msk:  Symmetrical without gross deformities. Without edema, no deformity or joint abnormality.  Neurologic:  Alert and  oriented x4;  grossly normal neurologically.  Skin:   Dry and intact without significant lesions or rashes. Psychiatric: Oriented to person, place and time. Demonstrates good judgement and reason without abnormal affect or behaviors.   RELEVANT LABS AND IMAGING: CBC    Component Value Date/Time   WBC 5.1 08/18/2023 1414   RBC 4.15 08/18/2023 1414   HGB 11.9 (L) 08/18/2023 1414   HCT 37.3 08/18/2023 1414   PLT 264.0 08/18/2023 1414   MCV 89.9 08/18/2023 1414   MCH 27.5 12/14/2020 0211   MCHC 31.9 08/18/2023 1414   RDW 15.3 08/18/2023 1414   LYMPHSABS 2.3 08/18/2023 1414   MONOABS 0.5 08/18/2023 1414   EOSABS 0.1 08/18/2023 1414   BASOSABS 0.0 08/18/2023 1414    CMP     Component Value Date/Time   NA 138 08/18/2023 1414   K 4.1 08/18/2023 1414   CL 105 08/18/2023 1414   CO2 24 08/18/2023 1414   GLUCOSE 113 (H) 08/18/2023 1414   GLUCOSE 122 (H) 09/10/2006 1520   BUN 30 (H) 08/18/2023 1414   CREATININE 1.72 (H) 08/18/2023 1414   CALCIUM 9.7 08/18/2023 1414   PROT 7.0 08/18/2023 1414   ALBUMIN 4.2 08/18/2023 1414   AST 15 08/18/2023 1414   ALT 10 08/18/2023 1414   ALKPHOS 54 08/18/2023 1414   BILITOT 0.6 08/18/2023 1414   GFRNONAA 35 (L) 12/14/2020 0211   GFRAA 64 (L) 07/21/2013 1355     Assessment/Plan:      Irritable Bowel Syndrome (IBS) Chronic LLQ pain Chronic left lower abdominal pain that  improves after a bowel movement with extensive workup including negative imaging and negative colonoscopy and intermittent relief on antibiotics.  Recent colonoscopy in 2022 was done for same so less likely to be scad.  No definitive diagnosis of diverticulitis in the past.  Suspect IBS especially since her diet is high in FODMAPs. -- Initiate Ibguard up to four times daily for abdominal pain. (Provided samples).Marland Kitchen  Avoid anticholinergics due to the effects -- Implement low FODMAP diet. -- Recommend squatty potty for bowel movement efficiency. -- Schedule follow-up in six weeks. -- Instruct to call if symptoms change or worsen.  Gastroesophageal Reflux Disease (GERD) Morning queasiness managed effectively with omeprazole.     History of colon polyps History of colon polyps on colonoscopy in 2022 with repeat recommended 5 years - Due for repeat colonoscopy 2027  Boone Master, PA-C [facility] 11/10/2023, 2:30 PM  Cc: Corwin Levins, MD

## 2023-11-10 NOTE — Patient Instructions (Signed)
 _______________________________________________________  If your blood pressure at your visit was 140/90 or greater, please contact your primary care physician to follow up on this.  _______________________________________________________  If you are age 78 or older, your body mass index should be between 23-30. Your Body mass index is 34.54 kg/m. If this is out of the aforementioned range listed, please consider follow up with your Primary Care Provider.  If you are age 73 or younger, your body mass index should be between 19-25. Your Body mass index is 34.54 kg/m. If this is out of the aformentioned range listed, please consider follow up with your Primary Care Provider.   ________________________________________________________  [facility] providers would like to encourage you to use [facility] to communicate with providers for non-urgent requests or questions.  Due to long hold times on the telephone, sending your provider a message by [facility] may be a faster and more efficient way to get a response.  Please allow 48 business hours for a response.  Please remember that this is for non-urgent requests.  _______________________________________________________    It was a pleasure to see you today!  Thank you for trusting me with your gastrointestinal care!

## 2023-11-10 NOTE — Progress Notes (Signed)
 Noted.

## 2023-11-14 ENCOUNTER — Other Ambulatory Visit: Payer: Self-pay | Admitting: Internal Medicine

## 2023-11-15 ENCOUNTER — Other Ambulatory Visit: Payer: Self-pay

## 2023-11-15 ENCOUNTER — Other Ambulatory Visit: Payer: Self-pay | Admitting: Internal Medicine

## 2023-11-24 ENCOUNTER — Other Ambulatory Visit: Payer: Self-pay | Admitting: Internal Medicine

## 2023-11-24 DIAGNOSIS — Z1231 Encounter for screening mammogram for malignant neoplasm of breast: Secondary | ICD-10-CM

## 2023-11-28 ENCOUNTER — Other Ambulatory Visit: Payer: Self-pay | Admitting: Internal Medicine

## 2023-11-28 DIAGNOSIS — B3731 Acute candidiasis of vulva and vagina: Secondary | ICD-10-CM

## 2023-11-29 ENCOUNTER — Other Ambulatory Visit: Payer: Self-pay

## 2023-12-17 ENCOUNTER — Other Ambulatory Visit: Payer: Self-pay | Admitting: Internal Medicine

## 2023-12-17 DIAGNOSIS — B3731 Acute candidiasis of vulva and vagina: Secondary | ICD-10-CM

## 2023-12-20 ENCOUNTER — Other Ambulatory Visit

## 2023-12-22 ENCOUNTER — Telehealth: Payer: Self-pay

## 2023-12-22 ENCOUNTER — Ambulatory Visit: Admitting: "Endocrinology

## 2023-12-22 ENCOUNTER — Encounter: Payer: Self-pay | Admitting: "Endocrinology

## 2023-12-22 VITALS — BP 102/70 | HR 84 | Ht 59.0 in | Wt 171.0 lb

## 2023-12-22 DIAGNOSIS — E1165 Type 2 diabetes mellitus with hyperglycemia: Secondary | ICD-10-CM | POA: Diagnosis not present

## 2023-12-22 DIAGNOSIS — E7800 Pure hypercholesterolemia, unspecified: Secondary | ICD-10-CM

## 2023-12-22 DIAGNOSIS — Z7984 Long term (current) use of oral hypoglycemic drugs: Secondary | ICD-10-CM

## 2023-12-22 MED ORDER — FREESTYLE LIBRE 3 PLUS SENSOR MISC: 3 refills | Status: AC

## 2023-12-22 NOTE — Telephone Encounter (Signed)
 Pharmacy Patient Advocate Encounter   Received notification from CoverMyMeds that prior authorization for Freestyle libre 3 plus is required/requested.   Insurance verification completed.   The patient is insured through [facility] .   Per test claim: PA required; PA submitted to above mentioned insurance via CoverMyMeds Key/confirmation #/EOC [id number] Status is pending

## 2023-12-22 NOTE — Progress Notes (Signed)
 Outpatient Endocrinology Note Cindy Newcomer, MD  12/22/23   Toiya Morrish [birth year] [id number]  Referring Provider: Roslyn Coombe, MD Primary Care Provider: Roslyn Coombe, MD Reason for consultation: Subjective   Assessment & Plan  Diagnoses and all orders for this visit:  Uncontrolled type 2 diabetes mellitus with hyperglycemia (HCC) -     C-peptide -     Comprehensive metabolic panel with GFR -     Microalbumin / creatinine urine ratio -     Continuous Glucose Sensor (FREESTYLE LIBRE 3 PLUS SENSOR) MISC; Change sensor every 15 days. 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, neuropathy  Lab Results  Component Value Date   GFR 28.40 (L) 08/18/2023   Hba1c goal less than 7, current Hba1c is  Lab Results  Component Value Date   HGBA1C 8.8 (H) 08/18/2023   Will recommend the following: Farixga 10mg  qd  Repaglinide  0.5mg  bid-non-complaint, takes it once a day  Januvia  25 mg every day  Ordered lab today Ordered Libre 3+ Ordered DM education  Patient to record BG in log, log provided to adjust medicines   No known contraindications/side effects to any of above medications No history of MEN syndrome/medullary thyroid  cancer/pancreatitis or pancreatic cancer in self or family  -Last LD and Tg are as follows: Lab Results  Component Value Date   LDLCALC 93 08/18/2023    Lab Results  Component Value Date   TRIG 129.0 08/18/2023   -On atorvastatin  20 mg QD -Follow low fat diet and exercise   -Blood pressure goal <140/90 - Microalbumin/creatinine goal is < 30 -Last MA/Cr is as follows: Lab Results  Component Value Date   MICROALBUR 2.1 (H) 11/12/2021   -on ACE/ARB lisinopril  5 mg every day  -diet changes including salt restriction -limit eating outside -counseled BP targets per standards of diabetes  care -uncontrolled blood pressure can lead to retinopathy, nephropathy and cardiovascular and atherosclerotic heart disease  Reviewed and counseled on: -A1C target -Blood sugar targets -Complications of uncontrolled diabetes  -Checking blood sugar before meals and bedtime and bring log next visit -All medications with mechanism of action and side effects -Hypoglycemia management: rule of 15's, Glucagon Emergency Kit and medical alert ID -low-carb low-fat plate-method diet -At least 20 minutes of physical activity per day -Annual dilated retinal eye exam and foot exam -compliance and follow up needs -follow up as scheduled or earlier if problem gets worse  Call if blood sugar is less than 70 or consistently above 250    Take a 15 gm snack of carbohydrate at bedtime before you go to sleep if your blood sugar is less than 100.    If you are going to fast after midnight for a test or procedure, ask your physician for instructions on how to reduce/decrease your insulin  dose.    Call if blood sugar is less than 70 or consistently above 250  -Treating a low sugar by rule of 15  (15 gms of sugar every 15 min until sugar is more than 70) If you feel your sugar is low, test your sugar to be sure If your sugar is low (less than 70), then take 15 grams of a fast acting Carbohydrate (3-4 glucose tablets or glucose gel or 4 ounces of juice or regular soda) Recheck your sugar 15 min after treating low to make sure it  is more than 70 If sugar is still less than 70, treat again with 15 grams of carbohydrate          Don't drive the hour of hypoglycemia  If unconscious/unable to eat or drink by mouth, use glucagon injection or nasal spray baqsimi and call 911. Can repeat again in 15 min if still unconscious.  Return in about 12 days (around 01/03/2024) for visit, labs today.   I have reviewed current medications, nurse's notes, allergies, vital signs, past medical and surgical history, family medical  history, and social history for this encounter. Counseled patient on symptoms, examination findings, lab findings, imaging results, treatment decisions and monitoring and prognosis. The patient understood the recommendations and agrees with the treatment plan. All questions regarding treatment plan were fully answered.  Cindy Newcomer, MD  12/22/23    History of Present Illness Cindy Larson is a 78 y.o. year old female who presents for evaluation of Type II diabetes mellitus.  Tauri Ethington was first diagnosed in 2014.   Diabetes education +  Home diabetes regimen: Farxiga  10mg  qd  Repaglinide  0.5mg  bid-non-complaint, takes it once a day  Januvia  25 mg every day   COMPLICATIONS +  MI/Stroke -   retinopathy +  neuropathy +  nephropathy  SYMPTOMS REVIEWED - Polyuria - Weight loss - Blurred vision  BLOOD SUGAR DATA 147 fasting, last checked some days ago   Physical Exam  BP 102/70   Pulse 84   Ht 4\' 11"  (1.499 m)   Wt 171 lb (77.6 kg)   SpO2 97%   BMI 34.54 kg/m    Constitutional: well developed, well nourished Head: normocephalic, atraumatic Eyes: sclera anicteric, no redness Neck: supple Lungs: normal respiratory effort Neurology: alert and oriented Skin: dry, no appreciable rashes Musculoskeletal: no appreciable defects Psychiatric: normal mood and affect Diabetic Foot Exam - Simple   No data filed      Current Medications Patient's Medications  New Prescriptions   CONTINUOUS GLUCOSE SENSOR (FREESTYLE LIBRE 3 PLUS SENSOR) MISC    Change sensor every 15 days.  Previous Medications   ACCU-CHEK GUIDE TEST TEST STRIP    USE AS DIRECTED 4 TIMES DAILY   ACCU-CHEK SOFTCLIX LANCETS LANCETS    USE 1 TO CHECK GLUCOSE 4 TIMES DAILY   ACETAMINOPHEN  (TYLENOL ) 500 MG TABLET    Take 500-1,000 mg by mouth every 8 (eight) hours as needed (for pain).   ALPRAZOLAM  (XANAX ) 0.25 MG TABLET    Take 1 tablet by mouth twice daily as needed for anxiety    ATORVASTATIN  (LIPITOR) 20 MG TABLET    Take 1 tablet by mouth once daily   BLOOD GLUCOSE MONITORING SUPPL (ACCU-CHEK GUIDE ME) W/DEVICE KIT    Use as directed four times per day E11.9   CETIRIZINE  (ZYRTEC ) 5 MG TABLET    Take 1 tablet (5 mg total) by mouth daily.   CHOLECALCIFEROL  (VITAMIN D3) 50 MCG (2000 UT) TABS    Take 2,000 Units by mouth daily.   CITALOPRAM  (CELEXA ) 10 MG TABLET    Take 1 tablet by mouth once daily   CLOPIDOGREL  (PLAVIX ) 75 MG TABLET    Take 1 tablet by mouth once daily   CYCLOBENZAPRINE  (FLEXERIL ) 5 MG TABLET    Take 1 tablet (5 mg total) by mouth 3 (three) times daily as needed.   DAPAGLIFLOZIN  PROPANEDIOL (FARXIGA ) 10 MG TABS TABLET    Take 1 tablet (10 mg total) by mouth daily before breakfast.   FLUCONAZOLE  (DIFLUCAN ) 150  MG TABLET    1 tab by mouth every 3 days as needed   FLUOCINOLONE ACETONIDE BODY 0.01 % OIL    Apply topically.   FLUTICASONE  (FLONASE ) 50 MCG/ACT NASAL SPRAY    Place 2 sprays into both nostrils daily.   GABAPENTIN  (NEURONTIN ) 300 MG CAPSULE    Take 1 capsule (300 mg total) by mouth 3 (three) times daily.   LISINOPRIL  (ZESTRIL ) 5 MG TABLET    TAKE 1 TABLET BY MOUTH ONCE DAILY. APPOINTMNET IS OVERDUE MUST SEE PROVIDER FOR FUTURE REFILLS.   MECLIZINE (ANTIVERT) 25 MG TABLET    TAKE 1 TABLET BY MOUTH EVERY 6 HOURS AS NEEDED FOR DIZZINESS   MUPIROCIN  OINTMENT (BACTROBAN ) 2 %    Apply 1 Application topically 2 (two) times daily. To affected area till better   OMEPRAZOLE  (PRILOSEC) 20 MG CAPSULE    TAKE 1 CAPSULE BY MOUTH TWICE DAILY BEFORE MEAL(S)   PAROXETINE  (PAXIL ) 20 MG TABLET    Take 1 tablet (20 mg total) by mouth daily.   POLYVINYL ALCOHOL-POVIDONE (REFRESH OP)    Apply to eye.   PROMETHAZINE  (PHENERGAN ) 25 MG TABLET    TAKE 1 TABLET BY MOUTH EVERY 6 HOURS AS NEEDED FOR NAUSEA   REPAGLINIDE  (PRANDIN ) 0.5 MG TABLET    Take 1 tablet (0.5 mg total) by mouth 2 (two) times daily before a meal.   SITAGLIPTIN  (JANUVIA ) 25 MG TABLET    Take 1 tablet (25 mg  total) by mouth daily.   TRAMADOL  (ULTRAM ) 50 MG TABLET    Take 1 tablet (50 mg total) by mouth every 6 (six) hours as needed.   TRIAMCINOLONE  (NASACORT ) 55 MCG/ACT AERO NASAL INHALER    Place 2 sprays into the nose daily.   TRIAMCINOLONE  CREAM (KENALOG ) 0.1 %    APPLY 1 APPLICATION TOPICALLY  TWICE DAILY  Modified Medications   No medications on file  Discontinued Medications   No medications on file    Allergies Allergies  Allergen Reactions   Aspirin  Nausea Only and Other (See Comments)    Made the stomach "hurt"   Fexofenadine  Other (See Comments)    "This makes me feel badly. I didn't feel well when I took it"   Nsaids Nausea Only and Other (See Comments)    The patient CAN tolerate Tramadol  and Tylenol - no Aleve or Motrin, though   Penicillins Hives and Other (See Comments)    Blisters, also   Pioglitazone     Prevnar [Pneumococcal 13-Val Conj Vacc] Swelling and Other (See Comments)    Left arm became swollen and welts appeared   Zocor [Simvastatin] Itching    Past Medical History Past Medical History:  Diagnosis Date   ALLERGIC RHINITIS    ANXIETY    COLONIC POLYPS, HX OF    DEPRESSION    DIABETES MELLITUS, TYPE II    Diverticulitis    DIVERTICULOSIS, COLON    Esophageal stricture    Gallstones    GERD    HIATAL HERNIA    History of blood transfusion    left knee replacement   HYPERLIPIDEMIA    Hypersomnolence    HYPERTENSION    INTERMITTENT VERTIGO    OBESITY    OSTEOARTHRITIS    L knee   OSTEOPENIA    Pneumonia    Post-menopausal    hormone replacement therapy    Past Surgical History Past Surgical History:  Procedure Laterality Date   ABDOMINAL HYSTERECTOMY     CESAREAN SECTION     CHOLECYSTECTOMY  COLONOSCOPY  03/02/2017   Elvin Hammer   Lysis of Adhesions     OOPHORECTOMY     one   POLYPECTOMY     ROTATOR CUFF REPAIR Left    TOTAL KNEE ARTHROPLASTY Left    WISDOM TOOTH EXTRACTION  07/2023    Family History family history includes Atrial  fibrillation in her mother; Breast cancer in her maternal grandmother and paternal aunt; Cancer in her mother; Colon cancer in her maternal uncle and paternal aunt; Diabetes in an other family member; Heart disease in her mother; Heart failure in her brother; Hypertension in her mother; Kidney disease in her maternal aunt; Lung cancer in her brother; Ovarian cancer in her sister.  Social History Social History   Socioeconomic History   Marital status: Married    Spouse name: Tilmon   Number of children: 3   Years of education: Not on file   Highest education level: 12th grade  Occupational History   Occupation: Naval architect  Tobacco Use   Smoking status: Never   Smokeless tobacco: Never  Vaping Use   Vaping status: Never Used  Substance and Sexual Activity   Alcohol use: No   Drug use: No   Sexual activity: Not on file  Other Topics Concern   Not on file  Social History Narrative   Lives with husband   Social Drivers of Health   Financial Resource Strain: Low Risk  (08/17/2023)   Overall Financial Resource Strain (CARDIA)    Difficulty of Paying Living Expenses: Not very hard  Food Insecurity: Food Insecurity Present (08/17/2023)   Hunger Vital Sign    Worried About Running Out of Food in the Last Year: Sometimes true    Ran Out of Food in the Last Year: Never true  Transportation Needs: No Transportation Needs (08/17/2023)   PRAPARE - Administrator, Civil Service (Medical): No    Lack of Transportation (Non-Medical): No  Physical Activity: Inactive (08/17/2023)   Exercise Vital Sign    Days of Exercise per Week: 0 days    Minutes of Exercise per Session: 20 min  Stress: No Stress Concern Present (08/17/2023)   [employer] of Occupational Health - Occupational Stress Questionnaire    Feeling of Stress : Not at all  Social Connections: Moderately Integrated (08/17/2023)   Social Connection and Isolation Panel [NHANES]    Frequency of Communication with  Friends and Family: More than three times a week    Frequency of Social Gatherings with Friends and Family: Twice a week    Attends Religious Services: 1 to 4 times per year    Active Member of [employer] or Organizations: No    Attends Banker Meetings: Never    Marital Status: Married  Catering manager Violence: Not At Risk (08/13/2023)   Humiliation, Afraid, Rape, and Kick questionnaire    Fear of Current or Ex-Partner: No    Emotionally Abused: No    Physically Abused: No    Sexually Abused: No    Lab Results  Component Value Date   HGBA1C 8.8 (H) 08/18/2023   HGBA1C 9.7 (A) 08/17/2022   HGBA1C 6.7 (H) 11/12/2021   Lab Results  Component Value Date   CHOL 170 08/18/2023   Lab Results  Component Value Date   HDL 51.20 08/18/2023   Lab Results  Component Value Date   LDLCALC 93 08/18/2023   Lab Results  Component Value Date   TRIG 129.0 08/18/2023   Lab Results  Component Value  Date   CHOLHDL 3 08/18/2023   Lab Results  Component Value Date   CREATININE 1.72 (H) 08/18/2023   Lab Results  Component Value Date   GFR 28.40 (L) 08/18/2023   Lab Results  Component Value Date   MICROALBUR 2.1 (H) 11/12/2021      Component Value Date/Time   NA 138 08/18/2023 1414   K 4.1 08/18/2023 1414   CL 105 08/18/2023 1414   CO2 24 08/18/2023 1414   GLUCOSE 113 (H) 08/18/2023 1414   GLUCOSE 122 (H) 09/10/2006 1520   BUN 30 (H) 08/18/2023 1414   CREATININE 1.72 (H) 08/18/2023 1414   CALCIUM  9.7 08/18/2023 1414   PROT 7.0 08/18/2023 1414   ALBUMIN 4.2 08/18/2023 1414   AST 15 08/18/2023 1414   ALT 10 08/18/2023 1414   ALKPHOS 54 08/18/2023 1414   BILITOT 0.6 08/18/2023 1414   GFRNONAA 35 (L) 12/14/2020 0211   GFRAA 64 (L) 07/21/2013 1355      Latest Ref Rng & Units 08/18/2023    2:14 PM 11/12/2021   12:26 PM 07/01/2021    3:37 PM  BMP  Glucose 70 - 99 mg/dL 161  61  096   BUN 6 - 23 mg/dL 30  27  18    Creatinine 0.40 - 1.20 mg/dL 0.45  4.09  8.11    Sodium 135 - 145 mEq/L 138  136  137   Potassium 3.5 - 5.1 mEq/L 4.1  4.8  4.8   Chloride 96 - 112 mEq/L 105  105  105   CO2 19 - 32 mEq/L 24  25  23    Calcium  8.4 - 10.5 mg/dL 9.7  9.9  9.2        Component Value Date/Time   WBC 5.1 08/18/2023 1414   RBC 4.15 08/18/2023 1414   HGB 11.9 (L) 08/18/2023 1414   HCT 37.3 08/18/2023 1414   PLT 264.0 08/18/2023 1414   MCV 89.9 08/18/2023 1414   MCH 27.5 12/14/2020 0211   MCHC 31.9 08/18/2023 1414   RDW 15.3 08/18/2023 1414   LYMPHSABS 2.3 08/18/2023 1414   MONOABS 0.5 08/18/2023 1414   EOSABS 0.1 08/18/2023 1414   BASOSABS 0.0 08/18/2023 1414     Parts of this note may have been dictated using voice recognition software. There may be variances in spelling and vocabulary which are unintentional. Not all errors are proofread. Please notify the [facility] if any discrepancies are noted or if the meaning of any statement is not clear.

## 2023-12-22 NOTE — Patient Instructions (Signed)

## 2023-12-23 LAB — COMPREHENSIVE METABOLIC PANEL WITH GFR
AG Ratio: 1.6 (calc) (ref 1.0–2.5)
ALT: 12 U/L (ref 6–29)
AST: 18 U/L (ref 10–35)
Albumin: 4.4 g/dL (ref 3.6–5.1)
Alkaline phosphatase (APISO): 62 U/L (ref 37–153)
BUN/Creatinine Ratio: 13 (calc) (ref 6–22)
BUN: 24 mg/dL (ref 7–25)
CO2: 22 mmol/L (ref 20–32)
Calcium: 9.5 mg/dL (ref 8.6–10.4)
Chloride: 106 mmol/L (ref 98–110)
Creat: 1.81 mg/dL — ABNORMAL HIGH (ref 0.60–1.00)
Globulin: 2.7 g/dL (ref 1.9–3.7)
Glucose, Bld: 132 mg/dL — ABNORMAL HIGH (ref 65–99)
Potassium: 4.3 mmol/L (ref 3.5–5.3)
Sodium: 138 mmol/L (ref 135–146)
Total Bilirubin: 0.5 mg/dL (ref 0.2–1.2)
Total Protein: 7.1 g/dL (ref 6.1–8.1)
eGFR: 28 mL/min/{1.73_m2} — ABNORMAL LOW (ref >=60)

## 2023-12-23 LAB — MICROALBUMIN / CREATININE URINE RATIO
Creatinine, Urine: 166 mg/dL (ref 20–275)
Microalb Creat Ratio: 15 mg/g{creat} (ref <30)
Microalb, Ur: 2.5 mg/dL

## 2023-12-23 LAB — C-PEPTIDE: C-Peptide: 6.73 ng/mL — ABNORMAL HIGH (ref 0.80–3.85)

## 2024-01-03 ENCOUNTER — Telehealth: Payer: Self-pay

## 2024-01-03 NOTE — Telephone Encounter (Signed)
 Patient was identified as falling into the [place] Measure - Diabetes.   Patient was: Appointment already scheduled for:  06/18.

## 2024-01-04 NOTE — Telephone Encounter (Signed)
 Pharmacy Patient Advocate Encounter  Received notification from [employer] that Prior Authorization for [facility] 3 plus has been DENIED.  See denial reason below. No denial letter attached in CMM. Will attach denial letter to Media tab once received.   PA #/Case ID/Reference #: [id number]

## 2024-01-05 ENCOUNTER — Ambulatory Visit: Admitting: Gastroenterology

## 2024-01-05 ENCOUNTER — Encounter: Payer: Self-pay | Admitting: Gastroenterology

## 2024-01-05 VITALS — BP 112/68 | HR 58 | Ht 59.0 in | Wt 171.0 lb

## 2024-01-05 DIAGNOSIS — Z860100 Personal history of colon polyps, unspecified: Secondary | ICD-10-CM

## 2024-01-05 DIAGNOSIS — K589 Irritable bowel syndrome without diarrhea: Secondary | ICD-10-CM | POA: Diagnosis not present

## 2024-01-05 DIAGNOSIS — K219 Gastro-esophageal reflux disease without esophagitis: Secondary | ICD-10-CM

## 2024-01-05 DIAGNOSIS — R1032 Left lower quadrant pain: Secondary | ICD-10-CM

## 2024-01-05 NOTE — Progress Notes (Signed)
 Chief Complaint: Chronic LLQ pain Primary GI MD: Dr. Elvin Hammer  HPI: 78 year old female history of stroke/TIA (on Plavix ), type 2 diabetes, hyperlipidemia, hypertension, presents for evaluation of chronic LLQ pain.   2019 seen by Mai Schwalbe, NP and at that time she had chronic LLQ biotics and a negative CT. She also underwent colonoscopy in 2022 which showed polyp, diverticulosis, otherwise unrevealing.    December 2020 underwent CT abdomen pelvis without contrast which was negative for diverticulitis. She was given antibiotics but this only provided temporary relief of her pain and then it continued.   March 2025 seen by me for chronic LLQ pain that improves after bowel movement with extensive negative workup recommended IBgard and low FODMAP diet  ---------------------TODAY-----------------------------  Patient presents today for follow-up.  She states since our last visit her pain has completely resolved.  Using IBgard, squatty potty, and modifying her diet choices.  PREVIOUS GI WORKUP   Colonoscopy 10/2020 - Three 1 to 3 mm polyps in the ascending colon and in the cecum, removed with a cold snare. Resected and retrieved.  - Diverticulosis in the sigmoid colon and in the right colon. 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 - repeat 5 years   ECHO 50-55%  Past Medical History:  Diagnosis Date   ALLERGIC RHINITIS    ANXIETY    COLONIC POLYPS, HX OF    DEPRESSION    DIABETES MELLITUS, TYPE II    Diverticulitis    DIVERTICULOSIS, COLON    Esophageal stricture    Gallstones    GERD    HIATAL HERNIA    History of blood transfusion    left knee replacement   HYPERLIPIDEMIA    Hypersomnolence    HYPERTENSION    INTERMITTENT VERTIGO    OBESITY    OSTEOARTHRITIS    L knee   OSTEOPENIA    Pneumonia    Post-menopausal    hormone replacement therapy    Past Surgical History:  Procedure Laterality Date   ABDOMINAL HYSTERECTOMY     CESAREAN SECTION     CHOLECYSTECTOMY      COLONOSCOPY  03/02/2017   Elvin Hammer   Lysis of Adhesions     OOPHORECTOMY     one   POLYPECTOMY     ROTATOR CUFF REPAIR Left    TOTAL KNEE ARTHROPLASTY Left    WISDOM TOOTH EXTRACTION  07/2023    Current Outpatient Medications  Medication Sig Dispense Refill   ACCU-CHEK GUIDE TEST test strip USE AS DIRECTED 4 TIMES DAILY 200 each 0   Accu-Chek Softclix Lancets lancets USE 1 TO CHECK GLUCOSE 4 TIMES DAILY 400 each 0   acetaminophen  (TYLENOL ) 500 MG tablet Take 500-1,000 mg by mouth every 8 (eight) hours as needed (for pain).     ALPRAZolam  (XANAX ) 0.25 MG tablet Take 1 tablet by mouth twice daily as needed for anxiety 60 tablet 5   atorvastatin  (LIPITOR) 20 MG tablet Take 1 tablet by mouth once daily 90 tablet 3   Blood Glucose Monitoring Suppl (ACCU-CHEK GUIDE ME) w/Device KIT Use as directed four times per day E11.9 1 kit 0   cetirizine  (ZYRTEC ) 5 MG tablet Take 1 tablet (5 mg total) by mouth daily. 30 tablet 11   Cholecalciferol  (VITAMIN D3) 50 MCG (2000 UT) TABS Take 2,000 Units by mouth daily.     citalopram  (CELEXA ) 10 MG tablet Take 1 tablet by mouth once daily 90 tablet 0   clopidogrel  (PLAVIX ) 75 MG tablet Take 1 tablet by mouth once daily 90  tablet 0   Continuous Glucose Sensor (FREESTYLE LIBRE 3 PLUS SENSOR) MISC Change sensor every 15 days. 6 each 3   cyclobenzaprine  (FLEXERIL ) 5 MG tablet Take 1 tablet (5 mg total) by mouth 3 (three) times daily as needed. 40 tablet 1   dapagliflozin  propanediol (FARXIGA ) 10 MG TABS tablet Take 1 tablet (10 mg total) by mouth daily before breakfast. 90 tablet 3   fluconazole  (DIFLUCAN ) 150 MG tablet 1 tab by mouth every 3 days as needed 2 tablet 1   Fluocinolone Acetonide Body 0.01 % OIL Apply topically.     fluticasone  (FLONASE ) 50 MCG/ACT nasal spray Place 2 sprays into both nostrils daily. 16 g 0   gabapentin  (NEURONTIN ) 300 MG capsule Take 1 capsule (300 mg total) by mouth 3 (three) times daily. 270 capsule 1   lisinopril  (ZESTRIL ) 5 MG  tablet TAKE 1 TABLET BY MOUTH ONCE DAILY. APPOINTMNET IS OVERDUE MUST SEE PROVIDER FOR FUTURE REFILLS. 90 tablet 3   meclizine (ANTIVERT) 25 MG tablet TAKE 1 TABLET BY MOUTH EVERY 6 HOURS AS NEEDED FOR DIZZINESS 30 tablet 0   mupirocin  ointment (BACTROBAN ) 2 % Apply 1 Application topically 2 (two) times daily. To affected area till better 22 g 0   omeprazole  (PRILOSEC) 20 MG capsule TAKE 1 CAPSULE BY MOUTH TWICE DAILY BEFORE MEAL(S) 180 capsule 2   PARoxetine  (PAXIL ) 20 MG tablet Take 1 tablet (20 mg total) by mouth daily. 90 tablet 3   Polyvinyl Alcohol-Povidone (REFRESH OP) Apply to eye.     promethazine  (PHENERGAN ) 25 MG tablet TAKE 1 TABLET BY MOUTH EVERY 6 HOURS AS NEEDED FOR NAUSEA 40 tablet 0   repaglinide  (PRANDIN ) 0.5 MG tablet Take 1 tablet (0.5 mg total) by mouth 2 (two) times daily before a meal. 180 tablet 3   sitaGLIPtin  (JANUVIA ) 25 MG tablet Take 1 tablet (25 mg total) by mouth daily. 90 tablet 3   traMADol  (ULTRAM ) 50 MG tablet Take 1 tablet (50 mg total) by mouth every 6 (six) hours as needed. 60 tablet 2   triamcinolone  (NASACORT ) 55 MCG/ACT AERO nasal inhaler Place 2 sprays into the nose daily. (Patient taking differently: Place 2 sprays into the nose daily as needed (for allergies or sinus issues).) 1 Inhaler 12   triamcinolone  cream (KENALOG ) 0.1 % APPLY 1 APPLICATION TOPICALLY  TWICE DAILY 30 g 0   No current facility-administered medications for this visit.    Allergies as of 01/05/2024 - Review Complete 01/05/2024  Allergen Reaction Noted   Aspirin  Nausea Only and Other (See Comments) 07/03/2007   Fexofenadine  Other (See Comments) 12/13/2020   Nsaids Nausea Only and Other (See Comments) 12/13/2020   Penicillins Hives and Other (See Comments) 07/03/2007   Pioglitazone   02/16/2023   Prevnar [pneumococcal 13-val conj vacc] Swelling and Other (See Comments) 12/14/2013   Zocor [simvastatin] Itching 03/14/2010    Family History  Problem Relation Age of Onset    Hypertension Mother    Atrial fibrillation Mother    Heart disease Mother        CHF   Cancer Mother        Cancer in "thigh"   Ovarian cancer Sister    Lung cancer Brother        smoker   Heart failure Brother        CHF   Breast cancer Maternal Grandmother    Kidney disease Maternal Aunt    Colon cancer Paternal Aunt        originated as breast CA  Breast cancer Paternal Aunt    Diabetes Other        paternal Aunt and uncle   Colon cancer Maternal Uncle    Esophageal cancer Neg Hx    Rectal cancer Neg Hx    Stomach cancer Neg Hx    Colon polyps Neg Hx     Social History   Socioeconomic History   Marital status: Married    Spouse name: Tilmon   Number of children: 3   Years of education: Not on file   Highest education level: 12th grade  Occupational History   Occupation: Naval architect  Tobacco Use   Smoking status: Never   Smokeless tobacco: Never  Vaping Use   Vaping status: Never Used  Substance and Sexual Activity   Alcohol use: No   Drug use: No   Sexual activity: Not on file  Other Topics Concern   Not on file  Social History Narrative   Lives with husband   Social Drivers of Health   Financial Resource Strain: Low Risk  (08/17/2023)   Overall Financial Resource Strain (CARDIA)    Difficulty of Paying Living Expenses: Not very hard  Food Insecurity: Food Insecurity Present (08/17/2023)   Hunger Vital Sign    Worried About Running Out of Food in the Last Year: Sometimes true    Ran Out of Food in the Last Year: Never true  Transportation Needs: No Transportation Needs (08/17/2023)   PRAPARE - Administrator, Civil Service (Medical): No    Lack of Transportation (Non-Medical): No  Physical Activity: Inactive (08/17/2023)   Exercise Vital Sign    Days of Exercise per Week: 0 days    Minutes of Exercise per Session: 20 min  Stress: No Stress Concern Present (08/17/2023)   [employer] of Occupational Health - Occupational Stress  Questionnaire    Feeling of Stress : Not at all  Social Connections: Moderately Integrated (08/17/2023)   Social Connection and Isolation Panel [NHANES]    Frequency of Communication with Friends and Family: More than three times a week    Frequency of Social Gatherings with Friends and Family: Twice a week    Attends Religious Services: 1 to 4 times per year    Active Member of [employer] or Organizations: No    Attends Banker Meetings: Never    Marital Status: Married  Catering manager Violence: Not At Risk (08/13/2023)   Humiliation, Afraid, Rape, and Kick questionnaire    Fear of Current or Ex-Partner: No    Emotionally Abused: No    Physically Abused: No    Sexually Abused: No    Review of Systems:    Constitutional: No weight loss, fever, chills, weakness or fatigue HEENT: Eyes: No change in vision               Ears, Nose, Throat:  No change in hearing or congestion Skin: No rash or itching Cardiovascular: No chest pain, chest pressure or palpitations   Respiratory: No SOB or cough Gastrointestinal: See HPI and otherwise negative Genitourinary: No dysuria or change in urinary frequency Neurological: No headache, dizziness or syncope Musculoskeletal: No new muscle or joint pain Hematologic: No bleeding or bruising Psychiatric: No history of depression or anxiety    Physical Exam:  Vital signs: BP 112/68   Pulse (!) 58   Ht 4\' 11"  (1.499 m)   Wt 171 lb (77.6 kg)   BMI 34.54 kg/m   Constitutional: NAD, alert and cooperative Head:  Normocephalic and atraumatic. Eyes:   PEERL, EOMI. No icterus. Conjunctiva pink. Rectal:  Declines Msk:  Symmetrical without gross deformities. Without edema, no deformity or joint abnormality.  Neurologic:  Alert and  oriented x4;  grossly normal neurologically.  Skin:   Dry and intact without significant lesions or rashes. Psychiatric: Oriented to person, place and time. Demonstrates good judgement and reason without abnormal  affect or behaviors.   RELEVANT LABS AND IMAGING: CBC    Component Value Date/Time   WBC 5.1 08/18/2023 1414   RBC 4.15 08/18/2023 1414   HGB 11.9 (L) 08/18/2023 1414   HCT 37.3 08/18/2023 1414   PLT 264.0 08/18/2023 1414   MCV 89.9 08/18/2023 1414   MCH 27.5 12/14/2020 0211   MCHC 31.9 08/18/2023 1414   RDW 15.3 08/18/2023 1414   LYMPHSABS 2.3 08/18/2023 1414   MONOABS 0.5 08/18/2023 1414   EOSABS 0.1 08/18/2023 1414   BASOSABS 0.0 08/18/2023 1414    CMP     Component Value Date/Time   NA 138 12/22/2023 1434   K 4.3 12/22/2023 1434   CL 106 12/22/2023 1434   CO2 22 12/22/2023 1434   GLUCOSE 132 (H) 12/22/2023 1434   GLUCOSE 122 (H) 09/10/2006 1520   BUN 24 12/22/2023 1434   CREATININE 1.81 (H) 12/22/2023 1434   CALCIUM  9.5 12/22/2023 1434   PROT 7.1 12/22/2023 1434   ALBUMIN 4.2 08/18/2023 1414   AST 18 12/22/2023 1434   ALT 12 12/22/2023 1434   ALKPHOS 54 08/18/2023 1414   BILITOT 0.5 12/22/2023 1434   GFRNONAA 35 (L) 12/14/2020 0211   GFRAA 64 (L) 07/21/2013 1355     Assessment/Plan:    Irritable Bowel Syndrome (IBS) Chronic LLQ pain Chronic left lower abdominal pain that improves after a bowel movement with extensive workup including negative CT scan, negative colonoscopy 2022, intermittently found antibiotics.  No previous definitive diagnosis of diverticulitis in the past.  Suspect IBS especially since her diet is high in FODMAPs.  Pain resolved through use of IBgard, squatty potty, adhering to FODMAP diet - Continue IBgard - Continue using squatty potty - Continue avoiding foods high in FODMAPs - Can follow-up as needed   Gastroesophageal Reflux Disease (GERD) Morning queasiness managed effectively with omeprazole .     History of colon polyps History of colon polyps on colonoscopy in 2022 with repeat recommended 5 years - Due for repeat colonoscopy 2027   Suzanna Erp, PA-C [facility] 01/05/2024, 2:59 PM  Cc: Roslyn Coombe,  MD

## 2024-01-07 ENCOUNTER — Ambulatory Visit: Admitting: "Endocrinology

## 2024-01-07 ENCOUNTER — Encounter: Payer: Self-pay | Admitting: "Endocrinology

## 2024-01-07 VITALS — BP 134/80 | HR 71 | Ht 59.0 in | Wt 174.0 lb

## 2024-01-07 DIAGNOSIS — E7800 Pure hypercholesterolemia, unspecified: Secondary | ICD-10-CM

## 2024-01-07 DIAGNOSIS — Z7984 Long term (current) use of oral hypoglycemic drugs: Secondary | ICD-10-CM | POA: Diagnosis not present

## 2024-01-07 DIAGNOSIS — E1165 Type 2 diabetes mellitus with hyperglycemia: Secondary | ICD-10-CM | POA: Diagnosis not present

## 2024-01-07 LAB — POCT GLYCOSYLATED HEMOGLOBIN (HGB A1C): Hemoglobin A1C: 7.1 % — AB (ref 4.0–5.6)

## 2024-01-07 MED ORDER — SITAGLIPTIN PHOSPHATE 25 MG PO TABS: 25.0000 mg | ORAL_TABLET | Freq: Every day | ORAL | 3 refills | Status: AC

## 2024-01-07 NOTE — Progress Notes (Signed)
 Outpatient Endocrinology Note Cindy Newcomer, MD  01/07/24   Cindy Larson 1946/08/10 [id number]  Referring Provider: Roslyn Coombe, MD Primary Care Provider: Roslyn Coombe, MD Reason for consultation: Subjective   Assessment & Plan  Diagnoses and all orders for this visit:  Uncontrolled type 2 diabetes mellitus with hyperglycemia (HCC) -     POCT glycosylated hemoglobin (Hb A1C) -     Lipid panel  Long term (current) use of oral hypoglycemic drugs  Pure hypercholesterolemia -     Lipid panel  Other orders -     sitaGLIPtin  (JANUVIA ) 25 MG tablet; Take 1 tablet (25 mg total) by mouth daily.   Diabetes Type II complicated by hyperglycemia, neuropathy  Lab Results  Component Value Date   GFR 28.40 (L) 08/18/2023   Hba1c goal less than 7, current Hba1c is  Lab Results  Component Value Date   HGBA1C 7.1 (A) 01/07/2024   Will recommend the following: Farixga 10mg  qd  Januvia  25 mg every day PRN Repaglinide  0.5mg  bid if blood sugar is more than 200  CGM not covered  Ordered DM education previously  Patient to record BG in log, log provided to adjust medicines   No known contraindications/side effects to any of above medications No history of MEN syndrome/medullary thyroid  cancer/pancreatitis or pancreatic cancer in self or family  -Last LD and Tg are as follows: Lab Results  Component Value Date   LDLCALC 93 08/18/2023    Lab Results  Component Value Date   TRIG 129.0 08/18/2023   -On atorvastatin  20 mg every day-bothers stomach in day so takes at night  -Follow low fat diet and exercise   -Blood pressure goal <140/90 - Microalbumin/creatinine goal is < 30 -Last MA/Cr is as follows: Lab Results  Component Value Date   MICROALBUR 2.5 12/22/2023   -on ACE/ARB lisinopril  5 mg every day  -diet changes including salt restriction -limit eating outside -counseled BP targets per standards of diabetes care -uncontrolled blood pressure can lead  to retinopathy, nephropathy and cardiovascular and atherosclerotic heart disease  Reviewed and counseled on: -A1C target -Blood sugar targets -Complications of uncontrolled diabetes  -Checking blood sugar before meals and bedtime and bring log next visit -All medications with mechanism of action and side effects -Hypoglycemia management: rule of 15's, Glucagon Emergency Kit and medical alert ID -low-carb low-fat plate-method diet -At least 20 minutes of physical activity per day -Annual dilated retinal eye exam and foot exam -compliance and follow up needs -follow up as scheduled or earlier if problem gets worse  Call if blood sugar is less than 70 or consistently above 250    Take a 15 gm snack of carbohydrate at bedtime before you go to sleep if your blood sugar is less than 100.    If you are going to fast after midnight for a test or procedure, ask your physician for instructions on how to reduce/decrease your insulin  dose.    Call if blood sugar is less than 70 or consistently above 250  -Treating a low sugar by rule of 15  (15 gms of sugar every 15 min until sugar is more than 70) If you feel your sugar is low, test your sugar to be sure If your sugar is low (less than 70), then take 15 grams of a fast acting Carbohydrate (3-4 glucose tablets or glucose gel or 4 ounces of juice or regular soda) Recheck your sugar 15 min after treating low to make sure it  is more than 70 If sugar is still less than 70, treat again with 15 grams of carbohydrate          Don't drive the hour of hypoglycemia  If unconscious/unable to eat or drink by mouth, use glucagon injection or nasal spray baqsimi and call 911. Can repeat again in 15 min if still unconscious.  Return in about 3 months (around 04/08/2024) for visit and 8 am labs before next visit.   I have reviewed current medications, nurse's notes, allergies, vital signs, past medical and surgical history, family medical history, and social  history for this encounter. Counseled patient on symptoms, examination findings, lab findings, imaging results, treatment decisions and monitoring and prognosis. The patient understood the recommendations and agrees with the treatment plan. All questions regarding treatment plan were fully answered.  Cindy Newcomer, MD  01/07/24    History of Present Illness Cindy Larson is a 78 y.o. year old female who presents for follow up of Type II diabetes mellitus.  Cindy Larson was first diagnosed in 2014.   Diabetes education +  Home diabetes regimen: Farxiga  10mg  qd  Repaglinide  0.5mg  bid Self stopped Januvia  25 mg every day   COMPLICATIONS +  MI/Stroke -   retinopathy +  neuropathy +  nephropathy  SYMPTOMS REVIEWED - Polyuria - Weight loss - Blurred vision  BLOOD SUGAR DATA  CGM interpretation: At today's visit, we reviewed her CGM downloads. The full report is scanned in the media. Reviewing the CGM trends, BG are  mostly well controlled, with some highs and some lows overnight and and in evening.   Physical Exam  BP 134/80   Pulse 71   Ht 4\' 11"  (1.499 m)   Wt 174 lb (78.9 kg)   SpO2 96%   BMI 35.14 kg/m    Constitutional: well developed, well nourished Head: normocephalic, atraumatic Eyes: sclera anicteric, no redness Neck: supple Lungs: normal respiratory effort Neurology: alert and oriented Skin: dry, no appreciable rashes Musculoskeletal: no appreciable defects Psychiatric: normal mood and affect Diabetic Foot Exam - Simple   Simple Foot Form Diabetic Foot exam was performed with the following findings: Yes 01/07/2024  9:43 AM  Visual Inspection No deformities, no ulcerations, no other skin breakdown bilaterally: Yes Sensation Testing Intact to touch and monofilament testing bilaterally: Yes Pulse Check Posterior Tibialis and Dorsalis pulse intact bilaterally: Yes Comments      Current Medications Patient's Medications  New  Prescriptions   No medications on file  Previous Medications   ACCU-CHEK GUIDE TEST TEST STRIP    USE AS DIRECTED 4 TIMES DAILY   ACCU-CHEK SOFTCLIX LANCETS LANCETS    USE 1 TO CHECK GLUCOSE 4 TIMES DAILY   ACETAMINOPHEN  (TYLENOL ) 500 MG TABLET    Take 500-1,000 mg by mouth every 8 (eight) hours as needed (for pain).   ALPRAZOLAM  (XANAX ) 0.25 MG TABLET    Take 1 tablet by mouth twice daily as needed for anxiety   ATORVASTATIN  (LIPITOR) 20 MG TABLET    Take 1 tablet by mouth once daily   BLOOD GLUCOSE MONITORING SUPPL (ACCU-CHEK GUIDE ME) W/DEVICE KIT    Use as directed four times per day E11.9   CETIRIZINE  (ZYRTEC ) 5 MG TABLET    Take 1 tablet (5 mg total) by mouth daily.   CHOLECALCIFEROL  (VITAMIN D3) 50 MCG (2000 UT) TABS    Take 2,000 Units by mouth daily.   CITALOPRAM  (CELEXA ) 10 MG TABLET    Take 1 tablet by mouth once  daily   CLOPIDOGREL  (PLAVIX ) 75 MG TABLET    Take 1 tablet by mouth once daily   CONTINUOUS GLUCOSE SENSOR (FREESTYLE LIBRE 3 PLUS SENSOR) MISC    Change sensor every 15 days.   CYCLOBENZAPRINE  (FLEXERIL ) 5 MG TABLET    Take 1 tablet (5 mg total) by mouth 3 (three) times daily as needed.   DAPAGLIFLOZIN  PROPANEDIOL (FARXIGA ) 10 MG TABS TABLET    Take 1 tablet (10 mg total) by mouth daily before breakfast.   FLUCONAZOLE  (DIFLUCAN ) 150 MG TABLET    1 tab by mouth every 3 days as needed   FLUOCINOLONE ACETONIDE BODY 0.01 % OIL    Apply topically.   FLUTICASONE  (FLONASE ) 50 MCG/ACT NASAL SPRAY    Place 2 sprays into both nostrils daily.   GABAPENTIN  (NEURONTIN ) 300 MG CAPSULE    Take 1 capsule (300 mg total) by mouth 3 (three) times daily.   LISINOPRIL  (ZESTRIL ) 5 MG TABLET    TAKE 1 TABLET BY MOUTH ONCE DAILY. APPOINTMNET IS OVERDUE MUST SEE PROVIDER FOR FUTURE REFILLS.   MECLIZINE (ANTIVERT) 25 MG TABLET    TAKE 1 TABLET BY MOUTH EVERY 6 HOURS AS NEEDED FOR DIZZINESS   MUPIROCIN  OINTMENT (BACTROBAN ) 2 %    Apply 1 Application topically 2 (two) times daily. To affected area  till better   OMEPRAZOLE  (PRILOSEC) 20 MG CAPSULE    TAKE 1 CAPSULE BY MOUTH TWICE DAILY BEFORE MEAL(S)   PAROXETINE  (PAXIL ) 20 MG TABLET    Take 1 tablet (20 mg total) by mouth daily.   POLYVINYL ALCOHOL-POVIDONE (REFRESH OP)    Apply to eye.   PROMETHAZINE  (PHENERGAN ) 25 MG TABLET    TAKE 1 TABLET BY MOUTH EVERY 6 HOURS AS NEEDED FOR NAUSEA   REPAGLINIDE  (PRANDIN ) 0.5 MG TABLET    Take 1 tablet (0.5 mg total) by mouth 2 (two) times daily before a meal.   TRAMADOL  (ULTRAM ) 50 MG TABLET    Take 1 tablet (50 mg total) by mouth every 6 (six) hours as needed.   TRIAMCINOLONE  (NASACORT ) 55 MCG/ACT AERO NASAL INHALER    Place 2 sprays into the nose daily.   TRIAMCINOLONE  CREAM (KENALOG ) 0.1 %    APPLY 1 APPLICATION TOPICALLY  TWICE DAILY  Modified Medications   Modified Medication Previous Medication   SITAGLIPTIN  (JANUVIA ) 25 MG TABLET sitaGLIPtin  (JANUVIA ) 25 MG tablet      Take 1 tablet (25 mg total) by mouth daily.    Take 1 tablet (25 mg total) by mouth daily.  Discontinued Medications   No medications on file    Allergies Allergies  Allergen Reactions   Aspirin  Nausea Only and Other (See Comments)    Made the stomach "hurt"   Fexofenadine  Other (See Comments)    "This makes me feel badly. I didn't feel well when I took it"   Nsaids Nausea Only and Other (See Comments)    The patient CAN tolerate Tramadol  and Tylenol - no Aleve or Motrin, though   Penicillins Hives and Other (See Comments)    Blisters, also   Pioglitazone     Prevnar [Pneumococcal 13-Val Conj Vacc] Swelling and Other (See Comments)    Left arm became swollen and welts appeared   Zocor [Simvastatin] Itching    Past Medical History Past Medical History:  Diagnosis Date   ALLERGIC RHINITIS    ANXIETY    COLONIC POLYPS, HX OF    DEPRESSION    DIABETES MELLITUS, TYPE II    Diverticulitis  DIVERTICULOSIS, COLON    Esophageal stricture    Gallstones    GERD    HIATAL HERNIA    History of blood transfusion     left knee replacement   HYPERLIPIDEMIA    Hypersomnolence    HYPERTENSION    INTERMITTENT VERTIGO    OBESITY    OSTEOARTHRITIS    L knee   OSTEOPENIA    Pneumonia    Post-menopausal    hormone replacement therapy    Past Surgical History Past Surgical History:  Procedure Laterality Date   ABDOMINAL HYSTERECTOMY     CESAREAN SECTION     CHOLECYSTECTOMY     COLONOSCOPY  03/02/2017   Elvin Hammer   Lysis of Adhesions     OOPHORECTOMY     one   POLYPECTOMY     ROTATOR CUFF REPAIR Left    TOTAL KNEE ARTHROPLASTY Left    WISDOM TOOTH EXTRACTION  07/2023    Family History family history includes Atrial fibrillation in her mother; Breast cancer in her maternal grandmother and paternal aunt; Cancer in her mother; Colon cancer in her maternal uncle and paternal aunt; Diabetes in an other family member; Heart disease in her mother; Heart failure in her brother; Hypertension in her mother; Kidney disease in her maternal aunt; Lung cancer in her brother; Ovarian cancer in her sister.  Social History Social History   Socioeconomic History   Marital status: Married    Spouse name: Tilmon   Number of children: 3   Years of education: Not on file   Highest education level: 12th grade  Occupational History   Occupation: Naval architect  Tobacco Use   Smoking status: Never   Smokeless tobacco: Never  Vaping Use   Vaping status: Never Used  Substance and Sexual Activity   Alcohol use: No   Drug use: No   Sexual activity: Not on file  Other Topics Concern   Not on file  Social History Narrative   Lives with husband   Social Drivers of Health   Financial Resource Strain: Low Risk  (08/17/2023)   Overall Financial Resource Strain (CARDIA)    Difficulty of Paying Living Expenses: Not very hard  Food Insecurity: Food Insecurity Present (08/17/2023)   Hunger Vital Sign    Worried About Running Out of Food in the Last Year: Sometimes true    Ran Out of Food in the Last Year: Never true   Transportation Needs: No Transportation Needs (08/17/2023)   PRAPARE - Administrator, Civil Service (Medical): No    Lack of Transportation (Non-Medical): No  Physical Activity: Inactive (08/17/2023)   Exercise Vital Sign    Days of Exercise per Week: 0 days    Minutes of Exercise per Session: 20 min  Stress: No Stress Concern Present (08/17/2023)   [employer] of Occupational Health - Occupational Stress Questionnaire    Feeling of Stress : Not at all  Social Connections: Moderately Integrated (08/17/2023)   Social Connection and Isolation Panel [NHANES]    Frequency of Communication with Friends and Family: More than three times a week    Frequency of Social Gatherings with Friends and Family: Twice a week    Attends Religious Services: 1 to 4 times per year    Active Member of [employer] or Organizations: No    Attends Banker Meetings: Never    Marital Status: Married  Catering manager Violence: Not At Risk (08/13/2023)   Humiliation, Afraid, Rape, and Kick questionnaire  Fear of Current or Ex-Partner: No    Emotionally Abused: No    Physically Abused: No    Sexually Abused: No    Lab Results  Component Value Date   HGBA1C 7.1 (A) 01/07/2024   HGBA1C 8.8 (H) 08/18/2023   HGBA1C 9.7 (A) 08/17/2022   Lab Results  Component Value Date   CHOL 170 08/18/2023   Lab Results  Component Value Date   HDL 51.20 08/18/2023   Lab Results  Component Value Date   LDLCALC 93 08/18/2023   Lab Results  Component Value Date   TRIG 129.0 08/18/2023   Lab Results  Component Value Date   CHOLHDL 3 08/18/2023   Lab Results  Component Value Date   CREATININE 1.81 (H) 12/22/2023   Lab Results  Component Value Date   GFR 28.40 (L) 08/18/2023   Lab Results  Component Value Date   MICROALBUR 2.5 12/22/2023      Component Value Date/Time   NA 138 12/22/2023 1434   K 4.3 12/22/2023 1434   CL 106 12/22/2023 1434   CO2 22 12/22/2023 1434    GLUCOSE 132 (H) 12/22/2023 1434   GLUCOSE 122 (H) 09/10/2006 1520   BUN 24 12/22/2023 1434   CREATININE 1.81 (H) 12/22/2023 1434   CALCIUM  9.5 12/22/2023 1434   PROT 7.1 12/22/2023 1434   ALBUMIN 4.2 08/18/2023 1414   AST 18 12/22/2023 1434   ALT 12 12/22/2023 1434   ALKPHOS 54 08/18/2023 1414   BILITOT 0.5 12/22/2023 1434   GFRNONAA 35 (L) 12/14/2020 0211   GFRAA 64 (L) 07/21/2013 1355      Latest Ref Rng & Units 12/22/2023    2:34 PM 08/18/2023    2:14 PM 11/12/2021   12:26 PM  BMP  Glucose 65 - 99 mg/dL 914  782  61   BUN 7 - 25 mg/dL 24  30  27    Creatinine 0.60 - 1.00 mg/dL 9.56  2.13  0.86   BUN/Creat Ratio 6 - 22 (calc) 13     Sodium 135 - 146 mmol/L 138  138  136   Potassium 3.5 - 5.3 mmol/L 4.3  4.1  4.8   Chloride 98 - 110 mmol/L 106  105  105   CO2 20 - 32 mmol/L 22  24  25    Calcium  8.6 - 10.4 mg/dL 9.5  9.7  9.9        Component Value Date/Time   WBC 5.1 08/18/2023 1414   RBC 4.15 08/18/2023 1414   HGB 11.9 (L) 08/18/2023 1414   HCT 37.3 08/18/2023 1414   PLT 264.0 08/18/2023 1414   MCV 89.9 08/18/2023 1414   MCH 27.5 12/14/2020 0211   MCHC 31.9 08/18/2023 1414   RDW 15.3 08/18/2023 1414   LYMPHSABS 2.3 08/18/2023 1414   MONOABS 0.5 08/18/2023 1414   EOSABS 0.1 08/18/2023 1414   BASOSABS 0.0 08/18/2023 1414     Parts of this note may have been dictated using voice recognition software. There may be variances in spelling and vocabulary which are unintentional. Not all errors are proofread. Please notify the [facility] if any discrepancies are noted or if the meaning of any statement is not clear.

## 2024-01-07 NOTE — Patient Instructions (Addendum)
 Will recommend the following: Farixga 10mg  qd  Repaglinide  0.5mg  bid ONLY if blood sugar is more than 200 Januvia  25 mg every day   Goals of DM therapy:  Morning Fasting blood sugar: 80-140  Blood sugar before meals: 80-140 Bed time blood sugar: 100-150  A1C <7%, limited only by hypoglycemia  1.Diabetes medications and their side effects discussed, including hypoglycemia    2. Check blood glucose:  a) Always check blood sugars before driving. Please see below (under hypoglycemia) on how to manage b) Check a minimum of 3 times/day or more as needed when having symptoms of hypoglycemia.   c) Try to check blood glucose before sleeping/in the middle of the night to ensure that it is remaining stable and not dropping less than 100 d) Check blood glucose more often if sick  3. Diet: a) 3 meals per day schedule b: Restrict carbs to 60-70 grams (4 servings) per meal c) Colorful vegetables - 3 servings a day, and low sugar fruit 2 servings/day Plate control method: 1/4 plate protein, 1/4 starch, 1/2 green, yellow, or red vegetables d) Avoid carbohydrate snacks unless hypoglycemic episode, or increased physical activity  4. Regular exercise as tolerated, preferably 3 or more hours a week  5. Hypoglycemia: a)  Do not drive or operate machinery without first testing blood glucose to assure it is over 90 mg%, or if dizzy, lightheaded, not feeling normal, etc, or  if foot or leg is numb or weak. b)  If blood glucose less than 70, take four 5gm Glucose tabs or 15-30 gm Glucose gel.  Repeat every 15 min as needed until blood sugar is >100 mg/dl. If hypoglycemia persists then call 911.   6. Sick day management: a) Check blood glucose more often b) Continue usual therapy if blood sugars are elevated.   7. Contact the doctor immediately if blood glucose is frequently <60 mg/dl, or an episode of severe hypoglycemia occurs (where someone had to give you glucose/  glucagon or if you passed out  from a low blood glucose), or if blood glucose is persistently >350 mg/dl, for further management  8. A change in level of physical activity or exercise and a change in diet may also affect your blood sugar. Check blood sugars more often and call if needed.  Instructions: 1. Bring glucose meter, blood glucose records on every visit for review 2. Continue to follow up with primary care physician and other providers for medical care 3. Yearly eye  and foot exam 4. Please get blood work done prior to the next appointment

## 2024-01-10 ENCOUNTER — Other Ambulatory Visit: Payer: Self-pay | Admitting: Internal Medicine

## 2024-01-11 ENCOUNTER — Ambulatory Visit
Admission: RE | Admit: 2024-01-11 | Discharge: 2024-01-11 | Disposition: A | Discharge status: Home / Self Care | Source: Ambulatory Visit | Attending: Internal Medicine | Admitting: Internal Medicine

## 2024-01-11 ENCOUNTER — Other Ambulatory Visit: Payer: Self-pay

## 2024-01-11 DIAGNOSIS — Z1231 Encounter for screening mammogram for malignant neoplasm of breast: Secondary | ICD-10-CM

## 2024-01-18 NOTE — Progress Notes (Signed)
 Noted

## 2024-02-09 DIAGNOSIS — N184 Chronic kidney disease, stage 4 (severe): Secondary | ICD-10-CM | POA: Diagnosis not present

## 2024-02-09 DIAGNOSIS — I129 Hypertensive chronic kidney disease with stage 1 through stage 4 chronic kidney disease, or unspecified chronic kidney disease: Secondary | ICD-10-CM | POA: Diagnosis not present

## 2024-02-09 DIAGNOSIS — E1122 Type 2 diabetes mellitus with diabetic chronic kidney disease: Secondary | ICD-10-CM | POA: Diagnosis not present

## 2024-02-15 ENCOUNTER — Other Ambulatory Visit: Payer: Self-pay | Admitting: Internal Medicine

## 2024-02-15 ENCOUNTER — Other Ambulatory Visit: Payer: Self-pay

## 2024-02-16 ENCOUNTER — Encounter: Payer: Self-pay | Admitting: Internal Medicine

## 2024-02-16 ENCOUNTER — Ambulatory Visit: Payer: Self-pay | Admitting: Internal Medicine

## 2024-02-16 ENCOUNTER — Ambulatory Visit (INDEPENDENT_AMBULATORY_CARE_PROVIDER_SITE_OTHER): Payer: Medicare Other | Admitting: Internal Medicine

## 2024-02-16 VITALS — BP 122/70 | HR 75 | Temp 98.6°F | Ht 59.0 in | Wt 169.0 lb

## 2024-02-16 DIAGNOSIS — Z0000 Encounter for general adult medical examination without abnormal findings: Secondary | ICD-10-CM | POA: Diagnosis not present

## 2024-02-16 DIAGNOSIS — I10 Essential (primary) hypertension: Secondary | ICD-10-CM

## 2024-02-16 DIAGNOSIS — E782 Mixed hyperlipidemia: Secondary | ICD-10-CM | POA: Diagnosis not present

## 2024-02-16 DIAGNOSIS — E1169 Type 2 diabetes mellitus with other specified complication: Secondary | ICD-10-CM

## 2024-02-16 DIAGNOSIS — E785 Hyperlipidemia, unspecified: Secondary | ICD-10-CM

## 2024-02-16 DIAGNOSIS — Z7984 Long term (current) use of oral hypoglycemic drugs: Secondary | ICD-10-CM | POA: Diagnosis not present

## 2024-02-16 DIAGNOSIS — N184 Chronic kidney disease, stage 4 (severe): Secondary | ICD-10-CM

## 2024-02-16 DIAGNOSIS — Z0001 Encounter for general adult medical examination with abnormal findings: Secondary | ICD-10-CM

## 2024-02-16 DIAGNOSIS — B3731 Acute candidiasis of vulva and vagina: Secondary | ICD-10-CM

## 2024-02-16 DIAGNOSIS — E1142 Type 2 diabetes mellitus with diabetic polyneuropathy: Secondary | ICD-10-CM | POA: Insufficient documentation

## 2024-02-16 LAB — HEPATIC FUNCTION PANEL
ALT: 11 U/L (ref 0–35)
AST: 15 U/L (ref 0–37)
Albumin: 4.5 g/dL (ref 3.5–5.2)
Alkaline Phosphatase: 62 U/L (ref 39–117)
Bilirubin, Direct: 0.1 mg/dL (ref 0.0–0.3)
Total Bilirubin: 0.5 mg/dL (ref 0.2–1.2)
Total Protein: 7.3 g/dL (ref 6.0–8.3)

## 2024-02-16 LAB — LIPID PANEL
Cholesterol: 174 mg/dL (ref 0–200)
HDL: 43.8 mg/dL (ref >39.00)
LDL Cholesterol: 67 mg/dL (ref 0–99)
NonHDL: 129.73
Total CHOL/HDL Ratio: 4
Triglycerides: 312 mg/dL — ABNORMAL HIGH (ref 0.0–149.0)
VLDL: 62.4 mg/dL — ABNORMAL HIGH (ref 0.0–40.0)

## 2024-02-16 LAB — TSH: TSH: 1.7 u[IU]/mL (ref 0.35–5.50)

## 2024-02-16 LAB — BASIC METABOLIC PANEL WITH GFR
BUN: 36 mg/dL — ABNORMAL HIGH (ref 6–23)
CO2: 23 meq/L (ref 19–32)
Calcium: 9.7 mg/dL (ref 8.4–10.5)
Chloride: 103 meq/L (ref 96–112)
Creatinine, Ser: 1.88 mg/dL — ABNORMAL HIGH (ref 0.40–1.20)
GFR: 25.44 mL/min — ABNORMAL LOW (ref >60.00)
Glucose, Bld: 211 mg/dL — ABNORMAL HIGH (ref 70–99)
Potassium: 4.2 meq/L (ref 3.5–5.1)
Sodium: 135 meq/L (ref 135–145)

## 2024-02-16 MED ORDER — FLUCONAZOLE 150 MG PO TABS: ORAL_TABLET | ORAL | 1 refills | Status: AC

## 2024-02-16 NOTE — Progress Notes (Unsigned)
 Patient ID: Cindy Larson, female   DOB: 11/10/1945, 78 y.o.   MRN: [id number]         Chief Complaint:: wellness exam and Medical Management of Chronic Issues (6 month follow up, wants her cholesterol checked )  , md, htn, hld, ckd3a       HPI:  Cindy Larson is a 78 y.o. female here for wellness exam; up to date.                  Also saw endo recently with A1c 7.1,was rx prandin  0.5 bid but did not tolerate de to bloating, resolved with stopping.  Denies worsening reflux, abd pain, dysphagia, n/v, bowel change or blood.   Not taking januvia  yet, wary of trying this      Wt Readings from Last 3 Encounters:  02/16/24 169 lb (76.7 kg)  01/07/24 174 lb (78.9 kg)  01/05/24 171 lb (77.6 kg)   BP Readings from Last 3 Encounters:  02/16/24 122/70  01/07/24 134/80  01/05/24 112/68   Immunization History  Administered Date(s) Administered   Fluad Quad(high Dose 65+) 05/24/2019, 06/15/2020, 06/16/2021, 05/20/2022, 05/10/2023   Influenza Split 06/29/2012   Influenza Whole 07/18/2003, 06/22/2008   Influenza, High Dose Seasonal PF 06/15/2013, 06/25/2015, 06/09/2016, 06/17/2017, 06/01/2018   Influenza,inj,Quad PF,6+ Mos 06/15/2014   PFIZER Comirnaty(Gray Top)Covid-19 Tri-Sucrose Vaccine 01/08/2021   PFIZER(Purple Top)SARS-COV-2 Vaccination 10/13/2019, 11/05/2019, 06/04/2020, 06/03/2021   PNEUMOCOCCAL CONJUGATE-20 02/06/2022   Pfizer(Comirnaty)Fall Seasonal Vaccine 12 years and older 05/10/2023   Pneumococcal Conjugate-13 06/29/2013   Pneumococcal Polysaccharide-23 08/17/2005, 10/24/2010, 08/04/2016   Td 10/24/2010   Tdap 11/09/2021   Unspecified SARS-COV-2 Vaccination 05/20/2022   Zoster Recombinant(Shingrix) 11/09/2021, 02/06/2022   Health Maintenance Due  Topic Date Due   COVID-19 Vaccine (8 - 2024-25 season) 11/07/2023      Past Medical History:  Diagnosis Date   ALLERGIC RHINITIS    ANXIETY    COLONIC POLYPS, HX OF    DEPRESSION    DIABETES MELLITUS, TYPE  II    Diverticulitis    DIVERTICULOSIS, COLON    Esophageal stricture    Gallstones    GERD    HIATAL HERNIA    History of blood transfusion    left knee replacement   HYPERLIPIDEMIA    Hypersomnolence    HYPERTENSION    INTERMITTENT VERTIGO    OBESITY    OSTEOARTHRITIS    L knee   OSTEOPENIA    Pneumonia    Post-menopausal    hormone replacement therapy   Past Surgical History:  Procedure Laterality Date   ABDOMINAL HYSTERECTOMY     CESAREAN SECTION     CHOLECYSTECTOMY     COLONOSCOPY  03/02/2017   Elvin Hammer   Lysis of Adhesions     OOPHORECTOMY     one   POLYPECTOMY     ROTATOR CUFF REPAIR Left    TOTAL KNEE ARTHROPLASTY Left    WISDOM TOOTH EXTRACTION  07/2023    reports that she has never smoked. She has never used smokeless tobacco. She reports that she does not drink alcohol and does not use drugs. family history includes Atrial fibrillation in her mother; Breast cancer in her maternal grandmother and paternal aunt; Cancer in her mother; Colon cancer in her maternal uncle and paternal aunt; Diabetes in her paternal aunt and paternal uncle; Heart disease in her mother; Heart failure in her brother; Hypertension in her mother; Kidney disease in her maternal aunt; Lung cancer in her brother; Ovarian cancer  in her sister. Allergies  Allergen Reactions   Aspirin  Nausea Only and Other (See Comments)    Made the stomach hurt   Fexofenadine  Other (See Comments)    This makes me feel badly. I didn't feel well when I took it   Nsaids Nausea Only and Other (See Comments)    The patient CAN tolerate Tramadol  and Tylenol - no Aleve or Motrin, though   Penicillins Hives and Other (See Comments)    Blisters, also   Pioglitazone     Prevnar [Pneumococcal 13-Val Conj Vacc] Swelling and Other (See Comments)    Left arm became swollen and welts appeared   Zocor [Simvastatin] Itching   Current Outpatient Medications on File Prior to Visit  Medication Sig Dispense Refill    ACCU-CHEK GUIDE TEST test strip USE AS DIRECTED 4 TIMES DAILY 200 each 0   Accu-Chek Softclix Lancets lancets USE 1 TO CHECK GLUCOSE 4 TIMES DAILY 400 each 0   acetaminophen  (TYLENOL ) 500 MG tablet Take 500-1,000 mg by mouth every 8 (eight) hours as needed (for pain).     ALPRAZolam  (XANAX ) 0.25 MG tablet Take 1 tablet by mouth twice daily as needed for anxiety 60 tablet 5   atorvastatin  (LIPITOR) 20 MG tablet Take 1 tablet by mouth once daily 90 tablet 0   Blood Glucose Monitoring Suppl (ACCU-CHEK GUIDE ME) w/Device KIT Use as directed four times per day E11.9 1 kit 0   cetirizine  (ZYRTEC ) 5 MG tablet Take 1 tablet (5 mg total) by mouth daily. 30 tablet 11   Cholecalciferol  (VITAMIN D3) 50 MCG (2000 UT) TABS Take 2,000 Units by mouth daily.     citalopram  (CELEXA ) 10 MG tablet Take 1 tablet by mouth once daily 90 tablet 0   clopidogrel  (PLAVIX ) 75 MG tablet Take 1 tablet by mouth once daily 90 tablet 0   Continuous Glucose Sensor (FREESTYLE LIBRE 3 PLUS SENSOR) MISC Change sensor every 15 days. 6 each 3   cyclobenzaprine  (FLEXERIL ) 5 MG tablet Take 1 tablet (5 mg total) by mouth 3 (three) times daily as needed. 40 tablet 1   dapagliflozin  propanediol (FARXIGA ) 10 MG TABS tablet Take 1 tablet (10 mg total) by mouth daily before breakfast. 90 tablet 3   fluconazole  (DIFLUCAN ) 150 MG tablet 1 tab by mouth every 3 days as needed 2 tablet 1   Fluocinolone Acetonide Body 0.01 % OIL Apply topically.     fluticasone  (FLONASE ) 50 MCG/ACT nasal spray Place 2 sprays into both nostrils daily. 16 g 0   gabapentin  (NEURONTIN ) 300 MG capsule Take 1 capsule (300 mg total) by mouth 3 (three) times daily. 270 capsule 1   lisinopril  (ZESTRIL ) 5 MG tablet TAKE 1 TABLET BY MOUTH ONCE DAILY. APPOINTMNET IS OVERDUE MUST SEE PROVIDER FOR FUTURE REFILLS. 90 tablet 3   meclizine (ANTIVERT) 25 MG tablet TAKE 1 TABLET BY MOUTH EVERY 6 HOURS AS NEEDED FOR DIZZINESS 30 tablet 0   mupirocin  ointment (BACTROBAN ) 2 % Apply 1  Application topically 2 (two) times daily. To affected area till better 22 g 0   omeprazole  (PRILOSEC) 20 MG capsule TAKE 1 CAPSULE BY MOUTH TWICE DAILY BEFORE MEAL(S) 180 capsule 0   PARoxetine  (PAXIL ) 20 MG tablet Take 1 tablet (20 mg total) by mouth daily. 90 tablet 3   Polyvinyl Alcohol-Povidone (REFRESH OP) Apply to eye.     promethazine  (PHENERGAN ) 25 MG tablet TAKE 1 TABLET BY MOUTH EVERY 6 HOURS AS NEEDED FOR NAUSEA 40 tablet 0   repaglinide  (PRANDIN ) 0.5  MG tablet Take 1 tablet (0.5 mg total) by mouth 2 (two) times daily before a meal. 180 tablet 3   sitaGLIPtin  (JANUVIA ) 25 MG tablet Take 1 tablet (25 mg total) by mouth daily. 90 tablet 3   traMADol  (ULTRAM ) 50 MG tablet Take 1 tablet (50 mg total) by mouth every 6 (six) hours as needed. 60 tablet 2   triamcinolone  (NASACORT ) 55 MCG/ACT AERO nasal inhaler Place 2 sprays into the nose daily. (Patient taking differently: Place 2 sprays into the nose daily as needed (for allergies or sinus issues).) 1 Inhaler 12   triamcinolone  cream (KENALOG ) 0.1 % APPLY 1 APPLICATION TOPICALLY  TWICE DAILY 30 g 0   No current facility-administered medications on file prior to visit.        ROS:  All others reviewed and negative.  Objective        PE:  BP 122/70 (BP Location: Right Arm, Patient Position: Sitting, Cuff Size: Normal)   Pulse 75   Temp 98.6 F (37 C) (Oral)   Ht 4' 11 (1.499 m)   Wt 169 lb (76.7 kg)   SpO2 99%   BMI 34.13 kg/m                 Constitutional: Pt appears in NAD               HENT: Head: NCAT.                Right Ear: External ear normal.                 Left Ear: External ear normal.                Eyes: . Pupils are equal, round, and reactive to light. Conjunctivae and EOM are normal               Nose: without d/c or deformity               Neck: Neck supple. Gross normal ROM               Cardiovascular: Normal rate and regular rhythm.                 Pulmonary/Chest: Effort normal and breath sounds without  rales or wheezing.                Abd:  Soft, NT, ND, + BS, no organomegaly               Neurological: Pt is alert. At baseline orientation, motor grossly intact               Skin: Skin is warm. No rashes, no other new lesions, LE edema - ***               Psychiatric: Pt behavior is normal without agitation   Micro: none  Cardiac tracings I have personally interpreted today:  none  Pertinent Radiological findings (summarize): none   Lab Results  Component Value Date   WBC 5.1 08/18/2023   HGB 11.9 (L) 08/18/2023   HCT 37.3 08/18/2023   PLT 264.0 08/18/2023   GLUCOSE 132 (H) 12/22/2023   CHOL 170 08/18/2023   TRIG 129.0 08/18/2023   HDL 51.20 08/18/2023   LDLDIRECT 83.0 04/21/2021   LDLCALC 93 08/18/2023   ALT 12 12/22/2023   AST 18 12/22/2023   NA 138 12/22/2023   K 4.3 12/22/2023   CL 106 12/22/2023   CREATININE 1.81 (  H) 12/22/2023   BUN 24 12/22/2023   CO2 22 12/22/2023   TSH 4.17 11/12/2021   INR 0.9 12/14/2020   HGBA1C 7.1 (A) 01/07/2024   MICROALBUR 2.5 12/22/2023   Assessment/Plan:  Sakai Heinle is a 78 y.o. Black or African American [2] female with  has a past medical history of ALLERGIC RHINITIS, ANXIETY, COLONIC POLYPS, HX OF, DEPRESSION, DIABETES MELLITUS, TYPE II, Diverticulitis, DIVERTICULOSIS, COLON, Esophageal stricture, Gallstones, GERD, HIATAL HERNIA, History of blood transfusion, HYPERLIPIDEMIA, Hypersomnolence, HYPERTENSION, INTERMITTENT VERTIGO, OBESITY, OSTEOARTHRITIS, OSTEOPENIA, Pneumonia, and Post-menopausal.  No problem-specific Assessment & Plan notes found for this encounter.  Followup: No follow-ups on file.  Rosalia Colonel, MD 02/16/2024 1:13 PM [facility]

## 2024-02-16 NOTE — Patient Instructions (Signed)

## 2024-02-16 NOTE — Progress Notes (Signed)
 The test results show that your current treatment is OK, as the tests are stable.  Please continue the same plan.  There is no other need for change of treatment or further evaluation based on these results, at this time.  thanks

## 2024-02-17 ENCOUNTER — Other Ambulatory Visit: Payer: Self-pay | Admitting: Internal Medicine

## 2024-02-18 ENCOUNTER — Other Ambulatory Visit: Payer: Self-pay

## 2024-02-19 ENCOUNTER — Encounter: Payer: Self-pay | Admitting: Internal Medicine

## 2024-02-19 DIAGNOSIS — B3731 Acute candidiasis of vulva and vagina: Secondary | ICD-10-CM | POA: Insufficient documentation

## 2024-02-19 NOTE — Assessment & Plan Note (Signed)
 BP Readings from Last 3 Encounters:  02/16/24 122/70  01/07/24 134/80  01/05/24 112/68   Stable, pt to continue medical treatment lisiniopril 5 mg qd

## 2024-02-19 NOTE — Assessment & Plan Note (Addendum)
 Lab Results  Component Value Date   CREATININE 1.88 (H) 02/16/2024   Stable overall, cont to avoid nephrotoxins, f/u renal as planned

## 2024-02-19 NOTE — Assessment & Plan Note (Signed)
 Lab Results  Component Value Date   LDLCALC 67 02/16/2024   Stable, pt to continue current statin lipitor 20 mg qd

## 2024-02-19 NOTE — Assessment & Plan Note (Signed)
 Lab Results  Component Value Date   HGBA1C 7.1 (A) 01/07/2024   Uncontrolled, pt to continue current medical treatment farxiga  10 every day, prandin  0.5 mg bid, januvia  25 mg every day as declines change for now

## 2024-02-19 NOTE — Assessment & Plan Note (Signed)

## 2024-02-22 ENCOUNTER — Ambulatory Visit: Admitting: Dietician

## 2024-03-20 ENCOUNTER — Other Ambulatory Visit: Payer: Self-pay | Admitting: Internal Medicine

## 2024-03-23 DIAGNOSIS — H04123 Dry eye syndrome of bilateral lacrimal glands: Secondary | ICD-10-CM | POA: Diagnosis not present

## 2024-03-23 DIAGNOSIS — E119 Type 2 diabetes mellitus without complications: Secondary | ICD-10-CM | POA: Diagnosis not present

## 2024-03-23 DIAGNOSIS — H402234 Chronic angle-closure glaucoma, bilateral, indeterminate stage: Secondary | ICD-10-CM | POA: Diagnosis not present

## 2024-03-23 DIAGNOSIS — H25813 Combined forms of age-related cataract, bilateral: Secondary | ICD-10-CM | POA: Diagnosis not present

## 2024-03-31 DIAGNOSIS — D4102 Neoplasm of uncertain behavior of left kidney: Secondary | ICD-10-CM | POA: Diagnosis not present

## 2024-04-06 ENCOUNTER — Other Ambulatory Visit: Payer: Self-pay | Admitting: Internal Medicine

## 2024-04-07 ENCOUNTER — Other Ambulatory Visit: Payer: Self-pay | Admitting: Internal Medicine

## 2024-04-07 ENCOUNTER — Other Ambulatory Visit: Payer: Self-pay | Admitting: Urology

## 2024-04-07 DIAGNOSIS — D4102 Neoplasm of uncertain behavior of left kidney: Secondary | ICD-10-CM

## 2024-04-11 ENCOUNTER — Other Ambulatory Visit

## 2024-04-11 DIAGNOSIS — E1165 Type 2 diabetes mellitus with hyperglycemia: Secondary | ICD-10-CM | POA: Diagnosis not present

## 2024-04-11 DIAGNOSIS — E7800 Pure hypercholesterolemia, unspecified: Secondary | ICD-10-CM | POA: Diagnosis not present

## 2024-04-12 ENCOUNTER — Ambulatory Visit
Admission: RE | Admit: 2024-04-12 | Discharge: 2024-04-12 | Disposition: A | Discharge status: Home / Self Care | Source: Ambulatory Visit | Attending: Urology | Admitting: Urology

## 2024-04-12 DIAGNOSIS — N2889 Other specified disorders of kidney and ureter: Secondary | ICD-10-CM | POA: Diagnosis not present

## 2024-04-12 DIAGNOSIS — N281 Cyst of kidney, acquired: Secondary | ICD-10-CM | POA: Diagnosis not present

## 2024-04-12 DIAGNOSIS — D4102 Neoplasm of uncertain behavior of left kidney: Secondary | ICD-10-CM

## 2024-04-12 LAB — LIPID PANEL
Cholesterol: 199 mg/dL (ref <200)
HDL: 57 mg/dL (ref >=50)
LDL Cholesterol (Calc): 112 mg/dL — ABNORMAL HIGH
Non-HDL Cholesterol (Calc): 142 mg/dL — ABNORMAL HIGH (ref <130)
Total CHOL/HDL Ratio: 3.5 (calc) (ref <5.0)
Triglycerides: 183 mg/dL — ABNORMAL HIGH (ref <150)

## 2024-04-12 MED ORDER — GADOPICLENOL 0.5 MMOL/ML IV SOLN
8.0000 mL | Freq: Once | INTRAVENOUS | Status: AC | PRN
  Administered 2024-04-12 (×2): 8 mL via INTRAVENOUS

## 2024-04-14 ENCOUNTER — Ambulatory Visit (INDEPENDENT_AMBULATORY_CARE_PROVIDER_SITE_OTHER): Admitting: "Endocrinology

## 2024-04-14 ENCOUNTER — Encounter: Payer: Self-pay | Admitting: "Endocrinology

## 2024-04-14 VITALS — BP 100/80 | HR 84 | Ht 59.0 in | Wt 172.0 lb

## 2024-04-14 DIAGNOSIS — E782 Mixed hyperlipidemia: Secondary | ICD-10-CM | POA: Diagnosis not present

## 2024-04-14 DIAGNOSIS — Z7984 Long term (current) use of oral hypoglycemic drugs: Secondary | ICD-10-CM | POA: Diagnosis not present

## 2024-04-14 DIAGNOSIS — E1140 Type 2 diabetes mellitus with diabetic neuropathy, unspecified: Secondary | ICD-10-CM | POA: Diagnosis not present

## 2024-04-14 LAB — POCT GLYCOSYLATED HEMOGLOBIN (HGB A1C): Hemoglobin A1C: 6.9 % — AB (ref 4.0–5.6)

## 2024-04-14 NOTE — Patient Instructions (Signed)

## 2024-04-14 NOTE — Progress Notes (Signed)
 Outpatient Endocrinology Note Cindy Birmingham, MD  04/14/24   Cindy Larson 06/13/1946 [id number]  Referring Provider: Norleen Lynwood ORN, MD Primary Care Provider: Norleen Lynwood ORN, MD Reason for consultation: Subjective   Assessment & Plan  Diagnoses and all orders for this visit:  Type 2 diabetes mellitus with diabetic neuropathy, without long-term current use of insulin  (HCC) -     POCT glycosylated hemoglobin (Hb A1C)  Long term (current) use of oral hypoglycemic drugs  Mixed hypercholesterolemia and hypertriglyceridemia   Diabetes Type II complicated by hyperglycemia, neuropathy  Lab Results  Component Value Date   GFR 25.44 (L) 02/16/2024   Hba1c goal less than 7, current Hba1c is  Lab Results  Component Value Date   HGBA1C 6.9 (A) 04/14/2024   Will recommend the following: Farixga 10mg  qd  Januvia  25 mg every day  CGM not covered  Ordered DM education previously  Patient to record BG in log, log provided to adjust medicines   No known contraindications/side effects to any of above medications No history of MEN syndrome/medullary thyroid  cancer/pancreatitis or pancreatic cancer in self or family  -Last LD and Tg are as follows: Lab Results  Component Value Date   LDLCALC 112 (H) 04/11/2024    Lab Results  Component Value Date   TRIG 183 (H) 04/11/2024   -On atorvastatin  20 mg every day-bothers stomach in day so takes at night  -Follow low fat diet and exercise   -Blood pressure goal <140/90 - Microalbumin/creatinine goal is < 30 -Last MA/Cr is as follows: Lab Results  Component Value Date   MICROALBUR 2.5 12/22/2023   -on ACE/ARB lisinopril  5 mg every day  -diet changes including salt restriction -limit eating outside -counseled BP targets per standards of diabetes care -uncontrolled blood pressure can lead to retinopathy, nephropathy and cardiovascular and atherosclerotic heart disease  Reviewed and counseled on: -A1C target -Blood  sugar targets -Complications of uncontrolled diabetes  -Checking blood sugar before meals and bedtime and bring log next visit -All medications with mechanism of action and side effects -Hypoglycemia management: rule of 15's, Glucagon Emergency Kit and medical alert ID -low-carb low-fat plate-method diet -At least 20 minutes of physical activity per day -Annual dilated retinal eye exam and foot exam -compliance and follow up needs -follow up as scheduled or earlier if problem gets worse  Call if blood sugar is less than 70 or consistently above 250    Take a 15 gm snack of carbohydrate at bedtime before you go to sleep if your blood sugar is less than 100.    If you are going to fast after midnight for a test or procedure, ask your physician for instructions on how to reduce/decrease your insulin  dose.    Call if blood sugar is less than 70 or consistently above 250  -Treating a low sugar by rule of 15  (15 gms of sugar every 15 min until sugar is more than 70) If you feel your sugar is low, test your sugar to be sure If your sugar is low (less than 70), then take 15 grams of a fast acting Carbohydrate (3-4 glucose tablets or glucose gel or 4 ounces of juice or regular soda) Recheck your sugar 15 min after treating low to make sure it is more than 70 If sugar is still less than 70, treat again with 15 grams of carbohydrate          Don't drive the hour of hypoglycemia  If unconscious/unable to  eat or drink by mouth, use glucagon injection or nasal spray baqsimi and call 911. Can repeat again in 15 min if still unconscious.  Return in about 6 months (around 10/15/2024).   I have reviewed current medications, nurse's notes, allergies, vital signs, past medical and surgical history, family medical history, and social history for this encounter. Counseled patient on symptoms, examination findings, lab findings, imaging results, treatment decisions and monitoring and prognosis. The patient  understood the recommendations and agrees with the treatment plan. All questions regarding treatment plan were fully answered.  Cindy Birmingham, MD  04/14/24    History of Present Illness Cindy Larson is a 78 y.o. year old female who presents for follow up of Type II diabetes mellitus.  Skie Vitrano was first diagnosed in 2014.   Diabetes education +  Home diabetes regimen: Farxiga  10mg  qd  Januvia  25 mg every day   COMPLICATIONS +  MI/Stroke -   retinopathy +  neuropathy +  nephropathy  BLOOD SUGAR DATA Did not bring meter 80s-144 fasting   Physical Exam  BP 100/80   Pulse 84   Ht 4' 11 (1.499 m)   Wt 172 lb (78 kg)   SpO2 96%   BMI 34.74 kg/m    Constitutional: well developed, well nourished Head: normocephalic, atraumatic Eyes: sclera anicteric, no redness Neck: supple Lungs: normal respiratory effort Neurology: alert and oriented Skin: dry, no appreciable rashes Musculoskeletal: no appreciable defects Psychiatric: normal mood and affect Diabetic Foot Exam - Simple   No data filed      Current Medications Patient's Medications  New Prescriptions   No medications on file  Previous Medications   ACCU-CHEK GUIDE TEST TEST STRIP    USE AS DIRECTED 4 TIMES DAILY   ACCU-CHEK SOFTCLIX LANCETS LANCETS    USE 1 TO CHECK GLUCOSE 4 TIMES DAILY   ACETAMINOPHEN  (TYLENOL ) 500 MG TABLET    Take 500-1,000 mg by mouth every 8 (eight) hours as needed (for pain).   ALPRAZOLAM  (XANAX ) 0.25 MG TABLET    Take 1 tablet by mouth twice daily as needed for anxiety   ATORVASTATIN  (LIPITOR) 20 MG TABLET    Take 1 tablet by mouth once daily   BLOOD GLUCOSE MONITORING SUPPL (ACCU-CHEK GUIDE ME) W/DEVICE KIT    Use as directed four times per day E11.9   CETIRIZINE  (ZYRTEC ) 5 MG TABLET    Take 1 tablet (5 mg total) by mouth daily.   CHOLECALCIFEROL  (VITAMIN D3) 50 MCG (2000 UT) TABS    Take 2,000 Units by mouth daily.   CITALOPRAM  (CELEXA ) 10 MG TABLET    Take 1  tablet by mouth once daily   CLOPIDOGREL  (PLAVIX ) 75 MG TABLET    Take 1 tablet by mouth once daily   CONTINUOUS GLUCOSE SENSOR (FREESTYLE LIBRE 3 PLUS SENSOR) MISC    Change sensor every 15 days.   CYCLOBENZAPRINE  (FLEXERIL ) 5 MG TABLET    Take 1 tablet (5 mg total) by mouth 3 (three) times daily as needed.   DAPAGLIFLOZIN  PROPANEDIOL (FARXIGA ) 10 MG TABS TABLET    Take 1 tablet (10 mg total) by mouth daily before breakfast.   FLUCONAZOLE  (DIFLUCAN ) 150 MG TABLET    1 tab by mouth every 3 days as needed   FLUOCINOLONE ACETONIDE BODY 0.01 % OIL    Apply topically.   FLUTICASONE  (FLONASE ) 50 MCG/ACT NASAL SPRAY    Place 2 sprays into both nostrils daily.   GABAPENTIN  (NEURONTIN ) 300 MG CAPSULE  Take 1 capsule (300 mg total) by mouth 3 (three) times daily.   LISINOPRIL  (ZESTRIL ) 5 MG TABLET    TAKE 1 TABLET BY MOUTH ONCE DAILY   MECLIZINE (ANTIVERT) 25 MG TABLET    TAKE 1 TABLET BY MOUTH EVERY 6 HOURS AS NEEDED FOR DIZZINESS   MUPIROCIN  OINTMENT (BACTROBAN ) 2 %    Apply 1 Application topically 2 (two) times daily. To affected area till better   OMEPRAZOLE  (PRILOSEC) 20 MG CAPSULE    TAKE 1 CAPSULE BY MOUTH TWICE DAILY BEFORE MEAL(S)   PAROXETINE  (PAXIL ) 20 MG TABLET    Take 1 tablet (20 mg total) by mouth daily.   POLYVINYL ALCOHOL-POVIDONE (REFRESH OP)    Apply to eye.   PROMETHAZINE  (PHENERGAN ) 25 MG TABLET    TAKE 1 TABLET BY MOUTH EVERY 6 HOURS AS NEEDED FOR NAUSEA   SITAGLIPTIN  (JANUVIA ) 25 MG TABLET    Take 1 tablet (25 mg total) by mouth daily.   TRAMADOL  (ULTRAM ) 50 MG TABLET    Take 1 tablet (50 mg total) by mouth every 6 (six) hours as needed.   TRIAMCINOLONE  (NASACORT ) 55 MCG/ACT AERO NASAL INHALER    Place 2 sprays into the nose daily.   TRIAMCINOLONE  CREAM (KENALOG ) 0.1 %    APPLY 1 APPLICATION TOPICALLY  TWICE DAILY  Modified Medications   No medications on file  Discontinued Medications   REPAGLINIDE  (PRANDIN ) 0.5 MG TABLET    Take 1 tablet (0.5 mg total) by mouth 2 (two) times  daily before a meal.    Allergies Allergies  Allergen Reactions   Aspirin  Nausea Only and Other (See Comments)    Made the stomach hurt   Fexofenadine  Other (See Comments)    This makes me feel badly. I didn't feel well when I took it   Nsaids Nausea Only and Other (See Comments)    The patient CAN tolerate Tramadol  and Tylenol - no Aleve or Motrin, though   Penicillins Hives and Other (See Comments)    Blisters, also   Pioglitazone     Prevnar [Pneumococcal 13-Val Conj Vacc] Swelling and Other (See Comments)    Left arm became swollen and welts appeared   Zocor [Simvastatin] Itching    Past Medical History Past Medical History:  Diagnosis Date   ALLERGIC RHINITIS    ANXIETY    COLONIC POLYPS, HX OF    DEPRESSION    DIABETES MELLITUS, TYPE II    Diverticulitis    DIVERTICULOSIS, COLON    Esophageal stricture    Gallstones    GERD    HIATAL HERNIA    History of blood transfusion    left knee replacement   HYPERLIPIDEMIA    Hypersomnolence    HYPERTENSION    INTERMITTENT VERTIGO    OBESITY    OSTEOARTHRITIS    L knee   OSTEOPENIA    Pneumonia    Post-menopausal    hormone replacement therapy    Past Surgical History Past Surgical History:  Procedure Laterality Date   ABDOMINAL HYSTERECTOMY     CESAREAN SECTION     CHOLECYSTECTOMY     COLONOSCOPY  03/02/2017   [facility]   Lysis of Adhesions     OOPHORECTOMY     one   POLYPECTOMY     ROTATOR CUFF REPAIR Left    TOTAL KNEE ARTHROPLASTY Left    WISDOM TOOTH EXTRACTION  07/2023    Family History family history includes Atrial fibrillation in her mother; Breast cancer in her maternal grandmother and  paternal aunt; Cancer in her mother; Colon cancer in her maternal uncle and paternal aunt; Diabetes in her paternal aunt and paternal uncle; Heart disease in her mother; Heart failure in her brother; Hypertension in her mother; Kidney disease in her maternal aunt; Lung cancer in her brother; Ovarian cancer in her  sister.  Social History Social History   Socioeconomic History   Marital status: Married    Spouse name: Tilmon   Number of children: 3   Years of education: Not on file   Highest education level: 12th grade  Occupational History   Occupation: Naval architect  Tobacco Use   Smoking status: Never   Smokeless tobacco: Never  Vaping Use   Vaping status: Never Used  Substance and Sexual Activity   Alcohol use: No   Drug use: No   Sexual activity: Not on file  Other Topics Concern   Not on file  Social History Narrative   Lives with husband   Social Drivers of Health   Financial Resource Strain: Medium Risk (02/15/2024)   Overall Financial Resource Strain (CARDIA)    Difficulty of Paying Living Expenses: Somewhat hard  Food Insecurity: No Food Insecurity (02/15/2024)   Hunger Vital Sign    Worried About Running Out of Food in the Last Year: Never true    Ran Out of Food in the Last Year: Never true  Transportation Needs: No Transportation Needs (02/15/2024)   PRAPARE - Administrator, Civil Service (Medical): No    Lack of Transportation (Non-Medical): No  Physical Activity: Inactive (02/15/2024)   Exercise Vital Sign    Days of Exercise per Week: 0 days    Minutes of Exercise per Session: Not on file  Stress: No Stress Concern Present (02/15/2024)   [employer] of Occupational Health - Occupational Stress Questionnaire    Feeling of Stress: Not at all  Social Connections: Unknown (02/15/2024)   Social Connection and Isolation Panel    Frequency of Communication with Friends and Family: Patient declined    Frequency of Social Gatherings with Friends and Family: Patient declined    Attends Religious Services: More than 4 times per year    Active Member of [employer] or Organizations: Yes    Attends Banker Meetings: Patient declined    Marital Status: Married  Catering manager Violence: Not At Risk (08/13/2023)   Humiliation, Afraid, Rape, and Kick  questionnaire    Fear of Current or Ex-Partner: No    Emotionally Abused: No    Physically Abused: No    Sexually Abused: No    Lab Results  Component Value Date   HGBA1C 6.9 (A) 04/14/2024   HGBA1C 7.1 (A) 01/07/2024   HGBA1C 8.8 (H) 08/18/2023   Lab Results  Component Value Date   CHOL 199 04/11/2024   Lab Results  Component Value Date   HDL 57 04/11/2024   Lab Results  Component Value Date   LDLCALC 112 (H) 04/11/2024   Lab Results  Component Value Date   TRIG 183 (H) 04/11/2024   Lab Results  Component Value Date   CHOLHDL 3.5 04/11/2024   Lab Results  Component Value Date   CREATININE 1.88 (H) 02/16/2024   Lab Results  Component Value Date   GFR 25.44 (L) 02/16/2024   Lab Results  Component Value Date   MICROALBUR 2.5 12/22/2023      Component Value Date/Time   NA 135 02/16/2024 1343   K 4.2 02/16/2024 1343   CL 103  02/16/2024 1343   CO2 23 02/16/2024 1343   GLUCOSE 211 (H) 02/16/2024 1343   GLUCOSE 122 (H) 09/10/2006 1520   BUN 36 (H) 02/16/2024 1343   CREATININE 1.88 (H) 02/16/2024 1343   CREATININE 1.81 (H) 12/22/2023 1434   CALCIUM  9.7 02/16/2024 1343   PROT 7.3 02/16/2024 1343   ALBUMIN 4.5 02/16/2024 1343   AST 15 02/16/2024 1343   ALT 11 02/16/2024 1343   ALKPHOS 62 02/16/2024 1343   BILITOT 0.5 02/16/2024 1343   GFRNONAA 35 (L) 12/14/2020 0211   GFRAA 64 (L) 07/21/2013 1355      Latest Ref Rng & Units 02/16/2024    1:43 PM 12/22/2023    2:34 PM 08/18/2023    2:14 PM  BMP  Glucose 70 - 99 mg/dL 788  867  886   BUN 6 - 23 mg/dL 36  24  30   Creatinine 0.40 - 1.20 mg/dL 8.11  8.18  8.27   BUN/Creat Ratio 6 - 22 (calc)  13    Sodium 135 - 145 mEq/L 135  138  138   Potassium 3.5 - 5.1 mEq/L 4.2  4.3  4.1   Chloride 96 - 112 mEq/L 103  106  105   CO2 19 - 32 mEq/L 23  22  24    Calcium  8.4 - 10.5 mg/dL 9.7  9.5  9.7        Component Value Date/Time   WBC 5.1 08/18/2023 1414   RBC 4.15 08/18/2023 1414   HGB 11.9 (L) 08/18/2023  1414   HCT 37.3 08/18/2023 1414   PLT 264.0 08/18/2023 1414   MCV 89.9 08/18/2023 1414   MCH 27.5 12/14/2020 0211   MCHC 31.9 08/18/2023 1414   RDW 15.3 08/18/2023 1414   LYMPHSABS 2.3 08/18/2023 1414   MONOABS 0.5 08/18/2023 1414   EOSABS 0.1 08/18/2023 1414   BASOSABS 0.0 08/18/2023 1414     Parts of this note may have been dictated using voice recognition software. There may be variances in spelling and vocabulary which are unintentional. Not all errors are proofread. Please notify the dino if any discrepancies are noted or if the meaning of any statement is not clear.

## 2024-04-21 DIAGNOSIS — D4102 Neoplasm of uncertain behavior of left kidney: Secondary | ICD-10-CM | POA: Diagnosis not present

## 2024-04-24 ENCOUNTER — Ambulatory Visit: Admitting: Internal Medicine

## 2024-05-12 ENCOUNTER — Other Ambulatory Visit: Payer: Self-pay | Admitting: Internal Medicine

## 2024-05-15 ENCOUNTER — Other Ambulatory Visit: Payer: Self-pay | Admitting: Internal Medicine

## 2024-05-24 ENCOUNTER — Other Ambulatory Visit: Payer: Self-pay | Admitting: Internal Medicine

## 2024-07-06 ENCOUNTER — Other Ambulatory Visit: Payer: Self-pay | Admitting: Internal Medicine

## 2024-07-06 ENCOUNTER — Other Ambulatory Visit: Payer: Self-pay

## 2024-07-10 ENCOUNTER — Other Ambulatory Visit: Payer: Self-pay | Admitting: Internal Medicine

## 2024-07-11 ENCOUNTER — Other Ambulatory Visit: Payer: Self-pay

## 2024-08-01 ENCOUNTER — Ambulatory Visit

## 2024-08-01 VITALS — Ht 59.0 in | Wt 172.0 lb

## 2024-08-01 DIAGNOSIS — Z0000 Encounter for general adult medical examination without abnormal findings: Secondary | ICD-10-CM | POA: Diagnosis not present

## 2024-08-01 NOTE — Progress Notes (Signed)
 Chief Complaint  Patient presents with   Medicare Wellness     Subjective:   Cindy Larson is a 78 y.o. female who presents for a Medicare Annual Wellness Visit.  Visit info / Clinical Intake: Medicare Wellness Visit Type:: Subsequent Annual Wellness Visit Persons participating in visit and providing information:: patient Medicare Wellness Visit Mode:: Telephone If telephone:: video declined Since this visit was completed virtually, some vitals may be partially provided or unavailable. Missing vitals are due to the limitations of the virtual format.: Unable to obtain vitals - no equipment If Telephone or Video please confirm:: I connected with patient using audio/video enable telemedicine. I verified patient identity with two identifiers, discussed telehealth limitations, and patient agreed to proceed. Patient Location:: Home Provider Location:: Office Interpreter Needed?: No Pre-visit prep was completed: yes AWV questionnaire completed by patient prior to visit?: yes Date:: 07/29/24 Living arrangements:: lives with spouse/significant other Patient's Overall Health Status Rating: very good Typical amount of pain: none Does pain affect daily life?: no Are you currently prescribed opioids?: no  Dietary Habits and Nutritional Risks How many meals a day?: 2 Eats fruit and vegetables daily?: yes Most meals are obtained by: preparing own meals In the last 2 weeks, have you had any of the following?: unintentional weight gain Diabetic:: (!) yes Any non-healing wounds?: no How often do you check your BS?: 1 (once a day) Would you like to be referred to a Nutritionist or for Diabetic Management? : no  Functional Status Activities of Daily Living (to include ambulation/medication): (Patient-Rptd) Independent Ambulation: Independent with device- listed below Home Assistive Devices/Equipment: Eyeglasses Medication Administration: Independent Home Management (perform  basic housework or laundry): (Patient-Rptd) Independent Manage your own finances?: yes Primary transportation is: driving Concerns about vision?: no *vision screening is required for WTM* Concerns about hearing?: no  Fall Screening Falls in the past year?: (Patient-Rptd) 0 Number of falls in past year: 0 Was there an injury with Fall?: 0 Fall Risk Category Calculator: 0 Patient Fall Risk Level: Low Fall Risk  Fall Risk Patient at Risk for Falls Due to: No Fall Risks Fall risk Follow up: Falls evaluation completed; Falls prevention discussed  Home and Transportation Safety: All rugs have non-skid backing?: yes All stairs or steps have railings?: N/A, no stairs Grab bars in the bathtub or shower?: (!) no Have non-skid surface in bathtub or shower?: yes Good home lighting?: yes Regular seat belt use?: yes Hospital stays in the last year:: no  Cognitive Assessment Difficulty concentrating, remembering, or making decisions? : no Will 6CIT or Mini Cog be Completed: no 6CIT or Mini Cog Declined: patient alert, oriented, able to answer questions appropriately and recall recent events What year is it?: 0 points What month is it?: 0 points Give patient an address phrase to remember (5 components): [address], [place] About what time is it?: 0 points Count backwards from 20 to 1: 0 points Say the months of the year in reverse: 0 points Repeat the address phrase from earlier: 0 points 6 CIT Score: 0 points  Advance Directives (For Healthcare) Does Patient Have a Medical Advance Directive?: No  Reviewed/Updated  Reviewed/Updated: Reviewed All (Medical, Surgical, Family, Medications, Allergies, Care Teams, Patient Goals)    Allergies (verified) Aspirin , Fexofenadine , Nsaids, Penicillins, Pioglitazone , Prevnar [pneumococcal 13-val conj vacc], and Zocor [simvastatin]   Current Medications (verified) Outpatient Encounter Medications as of 08/01/2024  Medication Sig   ACCU-CHEK  GUIDE TEST test strip USE 1 STRIP TO CHECK GLUCOSE  4 TIMES DAILY AS DIRECTED   Accu-Chek Softclix Lancets lancets USE 1 TO CHECK GLUCOSE 4 TIMES DAILY   acetaminophen  (TYLENOL ) 500 MG tablet Take 500-1,000 mg by mouth every 8 (eight) hours as needed (for pain).   ALPRAZolam  (XANAX ) 0.25 MG tablet Take 1 tablet by mouth twice daily as needed for anxiety   atorvastatin  (LIPITOR) 20 MG tablet Take 1 tablet by mouth once daily   Blood Glucose Monitoring Suppl (ACCU-CHEK GUIDE ME) w/Device KIT Use as directed four times per day E11.9   cetirizine  (ZYRTEC ) 5 MG tablet Take 1 tablet (5 mg total) by mouth daily.   Cholecalciferol  (VITAMIN D3) 50 MCG (2000 UT) TABS Take 2,000 Units by mouth daily.   citalopram  (CELEXA ) 10 MG tablet Take 1 tablet by mouth once daily   clopidogrel  (PLAVIX ) 75 MG tablet Take 1 tablet by mouth once daily   cyclobenzaprine  (FLEXERIL ) 5 MG tablet Take 1 tablet (5 mg total) by mouth 3 (three) times daily as needed.   dapagliflozin  propanediol (FARXIGA ) 10 MG TABS tablet Take 1 tablet (10 mg total) by mouth daily before breakfast.   fluconazole  (DIFLUCAN ) 150 MG tablet 1 tab by mouth every 3 days as needed   Fluocinolone Acetonide Body 0.01 % OIL Apply topically.   fluticasone  (FLONASE ) 50 MCG/ACT nasal spray Place 2 sprays into both nostrils daily.   gabapentin  (NEURONTIN ) 300 MG capsule Take 1 capsule (300 mg total) by mouth 3 (three) times daily.   lisinopril  (ZESTRIL ) 5 MG tablet TAKE 1 TABLET BY MOUTH ONCE DAILY   meclizine (ANTIVERT) 25 MG tablet TAKE 1 TABLET BY MOUTH EVERY 6 HOURS AS NEEDED FOR DIZZINESS   mupirocin  ointment (BACTROBAN ) 2 % Apply 1 Application topically 2 (two) times daily. To affected area till better   omeprazole  (PRILOSEC) 20 MG capsule TAKE 1 CAPSULE BY MOUTH TWICE DAILY BEFORE MEAL(S)   PARoxetine  (PAXIL ) 20 MG tablet Take 1 tablet (20 mg total) by mouth daily.   Polyvinyl Alcohol-Povidone (REFRESH OP) Apply to eye.   promethazine  (PHENERGAN ) 25 MG  tablet TAKE 1 TABLET BY MOUTH EVERY 6 HOURS AS NEEDED FOR NAUSEA   sitaGLIPtin  (JANUVIA ) 25 MG tablet Take 1 tablet (25 mg total) by mouth daily.   traMADol  (ULTRAM ) 50 MG tablet Take 1 tablet (50 mg total) by mouth every 6 (six) hours as needed.   triamcinolone  (NASACORT ) 55 MCG/ACT AERO nasal inhaler Place 2 sprays into the nose daily. (Patient taking differently: Place 2 sprays into the nose daily as needed (for allergies or sinus issues).)   triamcinolone  cream (KENALOG ) 0.1 % APPLY 1 APPLICATION TOPICALLY  TWICE DAILY   Continuous Glucose Sensor (FREESTYLE LIBRE 3 PLUS SENSOR) MISC Change sensor every 15 days. (Patient not taking: Reported on 08/01/2024)   No facility-administered encounter medications on file as of 08/01/2024.    History: Past Medical History:  Diagnosis Date   ALLERGIC RHINITIS    ANXIETY    COLONIC POLYPS, HX OF    DEPRESSION    DIABETES MELLITUS, TYPE II    Diverticulitis    DIVERTICULOSIS, COLON    Esophageal stricture    Gallstones    GERD    HIATAL HERNIA    History of blood transfusion    left knee replacement   HYPERLIPIDEMIA    Hypersomnolence    HYPERTENSION    INTERMITTENT VERTIGO    OBESITY    OSTEOARTHRITIS    L knee   OSTEOPENIA    Pneumonia    Post-menopausal  hormone replacement therapy   Past Surgical History:  Procedure Laterality Date   ABDOMINAL HYSTERECTOMY     CESAREAN SECTION     CHOLECYSTECTOMY     COLONOSCOPY  03/02/2017   [facility]   Lysis of Adhesions     OOPHORECTOMY     one   POLYPECTOMY     ROTATOR CUFF REPAIR Left    TOTAL KNEE ARTHROPLASTY Left    WISDOM TOOTH EXTRACTION  07/2023   Family History  Problem Relation Age of Onset   Hypertension Mother    Atrial fibrillation Mother    Heart disease Mother        CHF   Cancer Mother        Cancer in thigh   Ovarian cancer Sister    Kidney disease Maternal Aunt    Colon cancer Maternal Uncle    Diabetes Paternal Aunt    Colon cancer Paternal Aunt         originated as breast CA   Breast cancer Paternal Aunt    Diabetes Paternal Uncle    Breast cancer Maternal Grandmother    Lung cancer Brother        smoker   Heart failure Brother        CHF   Esophageal cancer Neg Hx    Rectal cancer Neg Hx    Stomach cancer Neg Hx    Colon polyps Neg Hx    Social History   Occupational History   Occupation: Naval Architect  Tobacco Use   Smoking status: Never   Smokeless tobacco: Never  Vaping Use   Vaping status: Never Used  Substance and Sexual Activity   Alcohol use: No   Drug use: No   Sexual activity: Not on file   Tobacco Counseling Counseling given: Not Answered  SDOH Screenings   Food Insecurity: No Food Insecurity (07/29/2024)  Housing: Unknown (07/29/2024)  Transportation Needs: No Transportation Needs (07/29/2024)  Utilities: Not At Risk (08/01/2024)  Alcohol Screen: Low Risk  (08/13/2023)  Depression (PHQ2-9): Low Risk  (08/01/2024)  Financial Resource Strain: Low Risk  (07/29/2024)  Physical Activity: Unknown (07/29/2024)  Social Connections: Unknown (07/29/2024)  Stress: No Stress Concern Present (08/01/2024)  Tobacco Use: Low Risk  (08/01/2024)  Health Literacy: Adequate Health Literacy (08/01/2024)   See flowsheets for full screening details  Depression Screen PHQ 2 & 9 Depression Scale- Over the past 2 weeks, how often have you been bothered by any of the following problems? Little interest or pleasure in doing things: 0 Feeling down, depressed, or hopeless (PHQ Adolescent also includes...irritable): 0 PHQ-2 Total Score: 0 Trouble falling or staying asleep, or sleeping too much: 0 Feeling tired or having little energy: 1 (feeling tired) Poor appetite or overeating (PHQ Adolescent also includes...weight loss): 0 Feeling bad about yourself - or that you are a failure or have let yourself or your family down: 0 Trouble concentrating on things, such as reading the newspaper or watching television (PHQ Adolescent also  includes...like school work): 0 Moving or speaking so slowly that other people could have noticed. Or the opposite - being so fidgety or restless that you have been moving around a lot more than usual: 0 Thoughts that you would be better off dead, or of hurting yourself in some way: 0 PHQ-9 Total Score: 1 If you checked off any problems, how difficult have these problems made it for you to do your work, take care of things at home, or get along with other people?: Not difficult  at all  Depression Treatment Depression Interventions/Treatment : EYV7-0 Score <4 Follow-up Not Indicated     Goals Addressed   None          Objective:    Today's Vitals   08/01/24 1559  Weight: 172 lb (78 kg)  Height: 4' 11 (1.499 m)   Body mass index is 34.74 kg/m.  Hearing/Vision screen Hearing Screening - Comments:: Denies hearing difficulties   Vision Screening - Comments:: Wears eyeglasses for reading/Dr. Whittington/UTD Immunizations and Health Maintenance Health Maintenance  Topic Date Due   OPHTHALMOLOGY EXAM  03/01/2024   HEMOGLOBIN A1C  10/15/2024   Diabetic kidney evaluation - Urine ACR  12/21/2024   Diabetic kidney evaluation - eGFR measurement  02/15/2025   FOOT EXAM  02/15/2025   Medicare Annual Wellness (AWV)  08/01/2025   Colonoscopy  10/08/2025   DTaP/Tdap/Td (3 - Td or Tdap) 11/10/2031   Pneumococcal Vaccine: 50+ Years  Completed   Influenza Vaccine  Completed   Bone Density Scan  Completed   Hepatitis C Screening  Completed   Zoster Vaccines- Shingrix  Completed   Meningococcal B Vaccine  Aged Out   Mammogram  Discontinued   COVID-19 Vaccine  Discontinued        Assessment/Plan:  This is a routine wellness examination for [place].  Patient Care Team: Norleen Lynwood ORN, MD as PCP - Diedre Kiang, Norleen SAILOR, MD as Consulting Physician (Gastroenterology) Stuart Norris, NP as Nurse Practitioner (Obstetrics and Gynecology) Cyrus Carwin, MD as Consulting Physician  (Ophthalmology)  I have personally reviewed and noted the following in the patient's chart:   Medical and social history Use of alcohol, tobacco or illicit drugs  Current medications and supplements including opioid prescriptions. Functional ability and status Nutritional status Physical activity Advanced directives List of other physicians Hospitalizations, surgeries, and ER visits in previous 12 months Vitals Screenings to include cognitive, depression, and falls Referrals and appointments  No orders of the defined types were placed in this encounter.  In addition, I have reviewed and discussed with patient certain preventive protocols, quality metrics, and best practice recommendations. A written personalized care plan for preventive services as well as general preventive health recommendations were provided to patient.   Jameya Pontiff L Jhonathan Desroches, CMA   08/01/2024   Return in 1 year (on 08/01/2025).  After Visit Summary: (MyChart) Due to this being a telephonic visit, the after visit summary with patients personalized plan was offered to patient via MyChart   Nurse Notes: Patient is up to date on all health maintenance with no concerns to address today.  Patient stated that she is waiting on her eye doctor to call and get her scheduled for her eye exam.

## 2024-08-01 NOTE — Patient Instructions (Addendum)
 [name],  Thank you for taking the time for your Medicare Wellness Visit. I appreciate your continued commitment to your health goals. Please review the care plan we discussed, and feel free to reach out if I can assist you further.  Please note that Annual Wellness Visits do not include a physical exam. Some assessments may be limited, especially if the visit was conducted virtually. If needed, we may recommend an in-person follow-up with your provider.  Ongoing Care Seeing your primary care provider every 3 to 6 months helps us  monitor your health and provide consistent, personalized care. Next office visit on 08/17/2024.    Referrals If a referral was made during today's visit and you haven't received any updates within two weeks, please contact the referred provider directly to check on the status.  Recommended Screenings:  Health Maintenance  Topic Date Due   Eye exam for diabetics  03/01/2024   Flu Shot  03/31/2024   Medicare Annual Wellness Visit  08/12/2024   Hemoglobin A1C  10/15/2024   Yearly kidney health urinalysis for diabetes  12/21/2024   Yearly kidney function blood test for diabetes  02/15/2025   Complete foot exam   02/15/2025   Colon Cancer Screening  10/08/2025   DTaP/Tdap/Td vaccine (3 - Td or Tdap) 11/10/2031   Pneumococcal Vaccine for age over 1  Completed   Osteoporosis screening with Bone Density Scan  Completed   Hepatitis C Screening  Completed   Zoster (Shingles) Vaccine  Completed   Meningitis B Vaccine  Aged Out   Breast Cancer Screening  Discontinued   COVID-19 Vaccine  Discontinued       07/29/2024   10:41 AM  Advanced Directives  Does Patient Have a Medical Advance Directive? No    Vision: Annual vision screenings are recommended for early detection of glaucoma, cataracts, and diabetic retinopathy. These exams can also reveal signs of chronic conditions such as diabetes and high blood pressure.  Dental: Annual dental screenings help  detect early signs of oral cancer, gum disease, and other conditions linked to overall health, including heart disease and diabetes.  Please see the attached documents for additional preventive care recommendations.

## 2024-08-04 LAB — LAB REPORT - SCANNED
Albumin, Urine POC: 18.9
Albumin/Creatinine Ratio, Urine, POC: 20
Creatinine, POC: 95.8 mg/dL
EGFR: 31

## 2024-08-14 ENCOUNTER — Other Ambulatory Visit: Payer: Self-pay

## 2024-08-14 ENCOUNTER — Other Ambulatory Visit: Payer: Self-pay | Admitting: Internal Medicine

## 2024-08-17 ENCOUNTER — Encounter: Payer: Self-pay | Admitting: Internal Medicine

## 2024-08-17 ENCOUNTER — Ambulatory Visit: Admitting: Internal Medicine

## 2024-08-17 ENCOUNTER — Ambulatory Visit: Payer: Self-pay | Admitting: Internal Medicine

## 2024-08-17 VITALS — BP 126/80 | HR 80 | Temp 98.8°F | Ht 59.0 in | Wt 176.0 lb

## 2024-08-17 DIAGNOSIS — N1831 Chronic kidney disease, stage 3a: Secondary | ICD-10-CM

## 2024-08-17 DIAGNOSIS — E1122 Type 2 diabetes mellitus with diabetic chronic kidney disease: Secondary | ICD-10-CM | POA: Diagnosis not present

## 2024-08-17 DIAGNOSIS — F32A Depression, unspecified: Secondary | ICD-10-CM | POA: Diagnosis not present

## 2024-08-17 DIAGNOSIS — I10 Essential (primary) hypertension: Secondary | ICD-10-CM

## 2024-08-17 DIAGNOSIS — Z7984 Long term (current) use of oral hypoglycemic drugs: Secondary | ICD-10-CM

## 2024-08-17 DIAGNOSIS — E782 Mixed hyperlipidemia: Secondary | ICD-10-CM

## 2024-08-17 LAB — CBC WITH DIFFERENTIAL/PLATELET
Basophils Absolute: 0 K/uL (ref 0.0–0.1)
Basophils Relative: 0.7 % (ref 0.0–3.0)
Eosinophils Absolute: 0.1 K/uL (ref 0.0–0.7)
Eosinophils Relative: 2.6 % (ref 0.0–5.0)
HCT: 38.4 % (ref 36.0–46.0)
Hemoglobin: 12.6 g/dL (ref 12.0–15.0)
Lymphocytes Relative: 51.3 % — ABNORMAL HIGH (ref 12.0–46.0)
Lymphs Abs: 1.9 K/uL (ref 0.7–4.0)
MCHC: 32.9 g/dL (ref 30.0–36.0)
MCV: 89.2 fl (ref 78.0–100.0)
Monocytes Absolute: 0.3 K/uL (ref 0.1–1.0)
Monocytes Relative: 8.5 % (ref 3.0–12.0)
Neutro Abs: 1.4 K/uL (ref 1.4–7.7)
Neutrophils Relative %: 36.9 % — ABNORMAL LOW (ref 43.0–77.0)
Platelets: 250 K/uL (ref 150.0–400.0)
RBC: 4.31 Mil/uL (ref 3.87–5.11)
RDW: 14.5 % (ref 11.5–15.5)
WBC: 3.8 K/uL — ABNORMAL LOW (ref 4.0–10.5)

## 2024-08-17 LAB — BASIC METABOLIC PANEL WITH GFR
BUN: 25 mg/dL — ABNORMAL HIGH (ref 6–23)
CO2: 22 meq/L (ref 19–32)
Calcium: 9.4 mg/dL (ref 8.4–10.5)
Chloride: 105 meq/L (ref 96–112)
Creatinine, Ser: 2.05 mg/dL — ABNORMAL HIGH (ref 0.40–1.20)
GFR: 22.85 mL/min — ABNORMAL LOW (ref >60.00)
Glucose, Bld: 187 mg/dL — ABNORMAL HIGH (ref 70–99)
Potassium: 4.4 meq/L (ref 3.5–5.1)
Sodium: 137 meq/L (ref 135–145)

## 2024-08-17 LAB — HEPATIC FUNCTION PANEL
ALT: 11 U/L (ref 3–35)
AST: 15 U/L (ref 5–37)
Albumin: 4.3 g/dL (ref 3.5–5.2)
Alkaline Phosphatase: 60 U/L (ref 39–117)
Bilirubin, Direct: 0 mg/dL — ABNORMAL LOW (ref 0.1–0.3)
Total Bilirubin: 0.5 mg/dL (ref 0.2–1.2)
Total Protein: 7.2 g/dL (ref 6.0–8.3)

## 2024-08-17 LAB — LIPID PANEL
Cholesterol: 195 mg/dL (ref 28–200)
HDL: 50.9 mg/dL (ref >39.00)
LDL Cholesterol: 115 mg/dL — ABNORMAL HIGH (ref 10–99)
NonHDL: 144.45
Total CHOL/HDL Ratio: 4
Triglycerides: 147 mg/dL (ref 10.0–149.0)
VLDL: 29.4 mg/dL (ref 0.0–40.0)

## 2024-08-17 LAB — TSH: TSH: 1.48 u[IU]/mL (ref 0.35–5.50)

## 2024-08-17 LAB — HEMOGLOBIN A1C: Hgb A1c MFr Bld: 7.6 % — ABNORMAL HIGH (ref 4.6–6.5)

## 2024-08-17 NOTE — Patient Instructions (Signed)

## 2024-08-17 NOTE — Progress Notes (Unsigned)
 Patient ID: Cindy Larson, female   DOB: October 29, 1945, 78 y.o.   MRN: [id number]        Chief Complaint: follow up HTN, HLD, dm with ckd3a, depression       HPI:  Cindy Larson is a 78 y.o. female here overall doing ok,  Pt denies chest pain, increased sob or doe, wheezing, orthopnea, PND, increased LE swelling, palpitations, dizziness or syncope.   Pt denies polydipsia, polyuria, or new focal neuro s/s.   Gained seveal lbs with less good diet. No change in activity.  Denies worsening depressive symptoms, suicidal ideation, or panic;  Wt Readings from Last 3 Encounters:  08/17/24 176 lb (79.8 kg)  08/01/24 172 lb (78 kg)  04/14/24 172 lb (78 kg)   BP Readings from Last 3 Encounters:  08/17/24 126/80  04/14/24 100/80  02/16/24 122/70         Past Medical History:  Diagnosis Date   ALLERGIC RHINITIS    ANXIETY    COLONIC POLYPS, HX OF    DEPRESSION    DIABETES MELLITUS, TYPE II    Diverticulitis    DIVERTICULOSIS, COLON    Esophageal stricture    Gallstones    GERD    HIATAL HERNIA    History of blood transfusion    left knee replacement   HYPERLIPIDEMIA    Hypersomnolence    HYPERTENSION    INTERMITTENT VERTIGO    OBESITY    OSTEOARTHRITIS    L knee   OSTEOPENIA    Pneumonia    Post-menopausal    hormone replacement therapy   Past Surgical History:  Procedure Laterality Date   ABDOMINAL HYSTERECTOMY     CESAREAN SECTION     CHOLECYSTECTOMY     COLONOSCOPY  03/02/2017   [facility]   Lysis of Adhesions     OOPHORECTOMY     one   POLYPECTOMY     ROTATOR CUFF REPAIR Left    TOTAL KNEE ARTHROPLASTY Left    WISDOM TOOTH EXTRACTION  07/2023    reports that she has never smoked. She has never used smokeless tobacco. She reports that she does not drink alcohol and does not use drugs. family history includes Atrial fibrillation in her mother; Breast cancer in her maternal grandmother and paternal aunt; Cancer in her mother; Colon cancer in her maternal  uncle and paternal aunt; Diabetes in her paternal aunt and paternal uncle; Heart disease in her mother; Heart failure in her brother; Hypertension in her mother; Kidney disease in her maternal aunt; Lung cancer in her brother; Ovarian cancer in her sister. Allergies[1] Medications Ordered Prior to Encounter[2]      ROS:  All others reviewed and negative.  Objective        PE:  BP 126/80 (BP Location: Right Arm, Patient Position: Sitting, Cuff Size: Normal)   Pulse 80   Temp 98.8 F (37.1 C) (Oral)   Ht 4' 11 (1.499 m)   Wt 176 lb (79.8 kg)   SpO2 98%   BMI 35.55 kg/m                 Constitutional: Pt appears in NAD               HENT: Head: NCAT.                Right Ear: External ear normal.                 Left Ear: External ear normal.  Eyes: . Pupils are equal, round, and reactive to light. Conjunctivae and EOM are normal               Nose: without d/c or deformity               Neck: Neck supple. Gross normal ROM               Cardiovascular: Normal rate and regular rhythm.                 Pulmonary/Chest: Effort normal and breath sounds without rales or wheezing.                Abd:  Soft, NT, ND, + BS, no organomegaly               Neurological: Pt is alert. At baseline orientation, motor grossly intact               Skin: Skin is warm. No rashes, no other new lesions, LE edema - none               Psychiatric: Pt behavior is normal without agitation   Micro: none  Cardiac tracings I have personally interpreted today:  none  Pertinent Radiological findings (summarize): none   Lab Results  Component Value Date   WBC 3.8 (L) 08/17/2024   HGB 12.6 08/17/2024   HCT 38.4 08/17/2024   PLT 250.0 08/17/2024   GLUCOSE 187 (H) 08/17/2024   CHOL 195 08/17/2024   TRIG 147.0 08/17/2024   HDL 50.90 08/17/2024   LDLDIRECT 83.0 04/21/2021   LDLCALC 115 (H) 08/17/2024   ALT 11 08/17/2024   AST 15 08/17/2024   NA 137 08/17/2024   K 4.4 08/17/2024   CL 105  08/17/2024   CREATININE 2.05 (H) 08/17/2024   BUN 25 (H) 08/17/2024   CO2 22 08/17/2024   TSH 1.48 08/17/2024   INR 0.9 12/14/2020   HGBA1C 7.6 (H) 08/17/2024   MICROALBUR 2.5 12/22/2023   Assessment/Plan:  Cindy Larson is a 78 y.o. Black or African American [2] female with  has a past medical history of ALLERGIC RHINITIS, ANXIETY, COLONIC POLYPS, HX OF, DEPRESSION, DIABETES MELLITUS, TYPE II, Diverticulitis, DIVERTICULOSIS, COLON, Esophageal stricture, Gallstones, GERD, HIATAL HERNIA, History of blood transfusion, HYPERLIPIDEMIA, Hypersomnolence, HYPERTENSION, INTERMITTENT VERTIGO, OBESITY, OSTEOARTHRITIS, OSTEOPENIA, Pneumonia, and Post-menopausal.  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  Lynwood Rush, MD 08/18/2024 8:34 PM [facility]     [1]  Allergies Allergen Reactions   Aspirin  Nausea Only and Other (See Comments)    Made the stomach hurt  Fexofenadine  Other (See Comments)    This makes me feel badly. I didn't feel well when I took it   Nsaids Nausea Only and  Other (See Comments)    The patient CAN tolerate Tramadol  and Tylenol - no Aleve or Motrin, though   Penicillins Hives and Other (See Comments)    Blisters, also   Pioglitazone     Prevnar [Pneumococcal 13-Val Conj Vacc] Swelling and Other (See Comments)    Left arm became swollen and welts appeared   Zocor [Simvastatin] Itching  [2]  Current Outpatient Medications on File Prior to Visit  Medication Sig Dispense Refill   ACCU-CHEK GUIDE TEST test strip USE 1 STRIP TO CHECK GLUCOSE 4 TIMES DAILY AS DIRECTED 200 each 0   Accu-Chek Softclix Lancets lancets USE 1 TO CHECK GLUCOSE 4 TIMES DAILY 400 each 0   acetaminophen  (TYLENOL ) 500 MG tablet Take 500-1,000 mg by mouth every 8 (eight) hours as needed (for pain).     ALPRAZolam  (XANAX ) 0.25 MG tablet Take 1 tablet by mouth twice daily as needed for anxiety 60 tablet 5   atorvastatin  (LIPITOR) 20 MG tablet Take 1 tablet by mouth once daily 90 tablet 0   Blood Glucose Monitoring Suppl (ACCU-CHEK GUIDE ME) w/Device KIT Use as directed four times per day E11.9 1 kit 0   cetirizine  (ZYRTEC ) 5 MG tablet Take 1 tablet (5 mg total) by mouth daily. 30 tablet 11   Cholecalciferol  (VITAMIN D3) 50 MCG (2000 UT) TABS Take 2,000 Units by mouth daily.     citalopram  (CELEXA ) 10 MG tablet Take 1 tablet by mouth once daily 90 tablet 0   clopidogrel  (PLAVIX ) 75 MG tablet Take 1 tablet by mouth once daily 90 tablet 0   Continuous Glucose Sensor (FREESTYLE LIBRE 3 PLUS SENSOR) MISC Change sensor every 15 days. 6 each 3   cyclobenzaprine  (FLEXERIL ) 5 MG tablet Take 1 tablet (5 mg total) by mouth 3 (three) times daily as needed. 40 tablet 1   dapagliflozin  propanediol (FARXIGA ) 10 MG TABS tablet Take 1 tablet (10 mg total) by mouth daily before breakfast. 90 tablet 3   fluconazole  (DIFLUCAN ) 150 MG tablet 1 tab by mouth every 3 days as needed 2 tablet 1   Fluocinolone Acetonide Body 0.01 % OIL Apply topically.     fluticasone  (FLONASE ) 50 MCG/ACT nasal spray Place 2  sprays into both nostrils daily. 16 g 0   gabapentin  (NEURONTIN ) 300 MG capsule Take 1 capsule (300 mg total) by mouth 3 (three) times daily. 270 capsule 1   lisinopril  (ZESTRIL ) 5 MG tablet TAKE 1 TABLET BY MOUTH ONCE DAILY 90 tablet 3   meclizine (ANTIVERT) 25 MG tablet TAKE 1 TABLET BY MOUTH EVERY 6 HOURS AS NEEDED FOR DIZZINESS 30 tablet 0   mupirocin  ointment (BACTROBAN ) 2 % Apply 1 Application topically 2 (two) times daily. To affected area till better 22 g 0   omeprazole  (PRILOSEC) 20 MG capsule TAKE 1 CAPSULE BY MOUTH TWICE DAILY BEFORE MEAL(S) 180 capsule 0   PARoxetine  (PAXIL ) 20 MG tablet Take 1 tablet (20 mg total) by mouth daily. 90 tablet 3   Polyvinyl Alcohol-Povidone (REFRESH OP) Apply to eye.     promethazine  (PHENERGAN ) 25 MG tablet TAKE 1 TABLET BY MOUTH EVERY 6 HOURS AS NEEDED FOR NAUSEA 40 tablet 0   sitaGLIPtin  (JANUVIA ) 25 MG tablet Take 1 tablet (25 mg total) by mouth daily. 90 tablet 3   traMADol  (ULTRAM ) 50 MG tablet Take 1 tablet (50 mg total) by  mouth every 6 (six) hours as needed. 60 tablet 2   triamcinolone  (NASACORT ) 55 MCG/ACT AERO nasal inhaler Place 2 sprays into the nose daily. (Patient taking differently: Place 2 sprays into the nose daily as needed (for allergies or sinus issues).) 1 Inhaler 12   triamcinolone  cream (KENALOG ) 0.1 % APPLY 1 APPLICATION TOPICALLY  TWICE DAILY 30 g 0   No current facility-administered medications on file prior to visit.

## 2024-08-18 ENCOUNTER — Encounter: Payer: Self-pay | Admitting: Internal Medicine

## 2024-08-18 NOTE — Assessment & Plan Note (Signed)
 BP Readings from Last 3 Encounters:  08/17/24 126/80  04/14/24 100/80  02/16/24 122/70   Stable, pt to continue medical treatment lisinopril  5 mg qd

## 2024-08-18 NOTE — Assessment & Plan Note (Signed)
 Ckd3a  Lab Results  Component Value Date   CREATININE 2.05 (H) 08/17/2024   Stable overall, cont to avoid nephrotoxins  Lab Results  Component Value Date   HGBA1C 7.6 (H) 08/17/2024   Mild uncontrolled, pt to continue current medical treatment farxiga  10 mg, januvia  25 mg every day and better diet, declines other change today

## 2024-08-18 NOTE — Assessment & Plan Note (Signed)
 Lab Results  Component Value Date   LDLCALC 115 (H) 08/17/2024   uncontrolled, pt to continue current statin lipitor 20 mg with good compliance and lower chol diet, declines other change today

## 2024-08-18 NOTE — Assessment & Plan Note (Signed)
Stable, cont celexa 10 qd

## 2024-09-26 ENCOUNTER — Other Ambulatory Visit: Payer: Self-pay | Admitting: Internal Medicine

## 2024-09-26 ENCOUNTER — Other Ambulatory Visit: Payer: Self-pay

## 2024-10-04 ENCOUNTER — Other Ambulatory Visit: Payer: Self-pay | Admitting: Internal Medicine

## 2024-10-05 ENCOUNTER — Other Ambulatory Visit: Payer: Self-pay

## 2024-10-16 ENCOUNTER — Ambulatory Visit: Admitting: "Endocrinology

## 2025-02-02 ENCOUNTER — Encounter: Admitting: Nurse Practitioner
